# Patient Record
Sex: Female | Born: 1935 | Race: White | Hispanic: No | Smoking: Former smoker
Health system: Southern US, Community
[De-identification: ages and names within clinical notes are randomized; demographics above are authoritative.]

## PROBLEM LIST (undated history)

## (undated) DIAGNOSIS — I480 Paroxysmal atrial fibrillation: Secondary | ICD-10-CM

## (undated) DIAGNOSIS — H9190 Unspecified hearing loss, unspecified ear: Secondary | ICD-10-CM

## (undated) DIAGNOSIS — K219 Gastro-esophageal reflux disease without esophagitis: Secondary | ICD-10-CM

## (undated) DIAGNOSIS — J449 Chronic obstructive pulmonary disease, unspecified: Secondary | ICD-10-CM

## (undated) DIAGNOSIS — I4891 Unspecified atrial fibrillation: Secondary | ICD-10-CM

## (undated) DIAGNOSIS — M1990 Unspecified osteoarthritis, unspecified site: Secondary | ICD-10-CM

## (undated) DIAGNOSIS — R011 Cardiac murmur, unspecified: Secondary | ICD-10-CM

## (undated) DIAGNOSIS — E785 Hyperlipidemia, unspecified: Secondary | ICD-10-CM

## (undated) DIAGNOSIS — Z9289 Personal history of other medical treatment: Secondary | ICD-10-CM

## (undated) DIAGNOSIS — N289 Disorder of kidney and ureter, unspecified: Secondary | ICD-10-CM

## (undated) DIAGNOSIS — K449 Diaphragmatic hernia without obstruction or gangrene: Secondary | ICD-10-CM

## (undated) DIAGNOSIS — M810 Age-related osteoporosis without current pathological fracture: Secondary | ICD-10-CM

## (undated) DIAGNOSIS — K222 Esophageal obstruction: Secondary | ICD-10-CM

## (undated) DIAGNOSIS — F419 Anxiety disorder, unspecified: Secondary | ICD-10-CM

## (undated) DIAGNOSIS — R918 Other nonspecific abnormal finding of lung field: Secondary | ICD-10-CM

## (undated) DIAGNOSIS — E039 Hypothyroidism, unspecified: Secondary | ICD-10-CM

## (undated) HISTORY — PX: CATARACT EXTRACTION W/ INTRAOCULAR LENS  IMPLANT, BILATERAL: SHX1307

## (undated) HISTORY — DX: Unspecified hearing loss, unspecified ear: H91.90

## (undated) HISTORY — PX: ABDOMINAL HYSTERECTOMY: SHX81

## (undated) HISTORY — PX: ESOPHAGEAL DILATION: SHX303

---

## 2004-01-10 HISTORY — PX: BREAST ENHANCEMENT SURGERY: SHX7

## 2004-09-08 ENCOUNTER — Ambulatory Visit (HOSPITAL_COMMUNITY): Admission: RE | Admit: 2004-09-08 | Discharge: 2004-09-08 | Payer: Self-pay | Admitting: Gastroenterology

## 2007-05-29 ENCOUNTER — Ambulatory Visit: Payer: Self-pay | Admitting: Cardiology

## 2007-05-29 ENCOUNTER — Inpatient Hospital Stay (HOSPITAL_COMMUNITY): Admission: EM | Admit: 2007-05-29 | Discharge: 2007-06-10 | Payer: Self-pay | Admitting: Emergency Medicine

## 2007-06-04 ENCOUNTER — Encounter (INDEPENDENT_AMBULATORY_CARE_PROVIDER_SITE_OTHER): Payer: Self-pay | Admitting: Internal Medicine

## 2007-06-19 ENCOUNTER — Ambulatory Visit: Payer: Self-pay | Admitting: Cardiology

## 2010-05-24 NOTE — Consult Note (Signed)
Amy [name], [name] NO.:  [id number]   MEDICAL RECORD NO.:  [id number]          PATIENT TYPE:  INP   LOCATION:  1404                         FACILITY:  [facility]   PHYSICIAN:  Rollene Rotunda, MD, FACCDATE OF BIRTH:  [birth year]   DATE OF CONSULTATION:  05/31/2007  DATE OF DISCHARGE:                                 CONSULTATION   PRIMARY CARE PHYSICIAN:  Will be Dr. Antoine Poche.   PRIMARY CARE PHYSICIAN:  Dr. Herb Grays.   PATIENT PROFILE:  A 75 year old Caucasian female without prior cardiac  history who we are asked to evaluate for PAF.   PROBLEMS:  1. Paroxysmal atrial fibrillation.  2. Acute bronchitis with hypoxia.  3. Remote tobacco abuse, quitting 14 years ago following greater than      25-pack-year history.  4. History of hiatal hernia status post savory dilatation August 2006.  5. Osteoporosis.  6. Osteoarthritis.  7. Hypothyroidism.  8. Status post breast implants for cosmetic reasons.  9. Status post colonoscopy in 2006 which was normal.  10.Uterine polyps status post hysterectomy.   HISTORY OF PRESENT ILLNESS:  A 76 year old married Caucasian female with  an approximately five year history of infrequent palpitations and  fluttering occurring approximately two times per year, lasting less than  one hour and resolving spontaneously. Last episode occurred  approximately six months ago. Over the past week, she developed cough  with yellow sputum and subsequently developed fevers, chills, and  dyspnea on this past Tuesday night, prompting a visit to her primary  care Amy [name] on Wednesday and then admission to [facility] for IV  antibiotics. Early this morning about midnight, she noted fluttering  without chest pain or shortness of breath. This fluttering was similar  to previous episodes of palpitations. The telemetry monitor showed  coarse atrial fibrillation, and she converted spontaneously to sinus  rhythm at approximately 3 a.m. She has no  complaints today. She is very  active at home without prior history of chest pain, shortness of breath.  Denies PND, orthopnea, dizziness, syncope, edema, or early satiety.   ALLERGIES:  CODEINE.   CURRENT MEDICATIONS:  1. Albuterol inhaler q.6h.  2. Vitamin C 500 mg daily.  3. Aspirin 81 mg daily.  4. Zithromax 500 mg IV daily.  5. Os-Cal 500 mg daily.  6. Rocephin 1 g IV daily.  7. Plavix 75 mg daily.  8. Protonix 40 mg IV every night.  9. Neutra-Phos t.i.d.  10.Enoxaparin 40 mg subcutaneous daily.  11.Mucinex 600 mg b.i.d.  12.Tussionex 5 mL b.i.d.  13.Atrovent inhaler q.6h.  14.Synthroid 100 mcg daily.  15.Multivitamin daily.  16.Omega-3 1 g b.i.d.  17.Senna 1 tablet every night.   FAMILY HISTORY:  Mother died of heart failure at age 40. Father died of  a MI at 38. She has a brother who died at 29, again with history of  diabetes.   SOCIAL HISTORY:  She lives with her husband in [place]. She has a  greater than 25-pack-year history of tobacco abuse, quitting about 14  years ago. She is still using nicotine gum.  REVIEW OF SYSTEMS:  Cough with yellow sputum, pleuritic chest pain, and  history of palpitations as outlined in the HPI. Otherwise, all systems  reviewed and negative.   PHYSICAL EXAMINATION:  Temperature 98.1, heart rate 80, respirations 20,  blood pressure awake, oriented x3.  SKIN:  Is warm and dry without lesions or masses.  NEUROLOGICAL:  Is grossly intact and nonfocal.  HEENT:  Normal.  NECK:  No bruits or JVD.  LUNGS:  Expirations with scattered rhonchi, expiratory and inspiratory  wheezing, and crackles at bilateral bases.  CARDIAC:  Regular, S1/S2. No S3, S4, murmurs.  ABDOMEN:  Soft, nontender, nondistended. Bowel sounds present x4.  EXTREMITIES:  Warm, dry, pink. No clubbing, cyanosis, or edema. Dorsalis  pedis, posterior tibia pulses 2+ and equal bilaterally. No femoral or  abdominal bruits are noted.   ACCESSORY CLINICAL FINDINGS:   Hemoglobin 9.5, hematocrit 28.5, WBC 15.9,  platelets 214. Sodium 139, potassium 3.7, chloride 109, CO2 26, BUN 6,  creatinine 0.73, glucose 112. Total bilirubin 1.0, alkaline phosphatase  86, AST 21, ALT 19. Cardiac markers negative x5. Magnesium 2.0. TSH  0.031. Nasal swab was negative. Blood cultures negative x2. Urinalysis  showed moderate leukocytes and 36 WBCs. ECG showed atrial fibrillation  that converted mid ECG to sinus rhythm at a rate of 82; no ST-T changes.  Telemetry showed coarse atrial fibrillation earlier this morning,  converted to sinus rhythm at about 3 a.m. Chest x-ray shows no acute  infiltrate, mild hyperinflation, mild central increase in bronchial  markers, and mild bronchitic changes.   ASSESSMENT AND PLAN:  Paroxysmal atrial fibrillation. She has a five-  year history of palpitations and fluttering occurring about two times  per year, lasting less than an hour. She did have symptoms early this  morning that resolved spontaneously. Her CHADS is 0, without prior  history of congestive heart failure, hypertension, age less than 70, no  history of diabetes or stroke. Add low-dose diltiazem. I have chosen  this over a beta blocker secondary to active wheezing. Also, continue  full-strength aspirin and discontinue Plavix. TSH was low, and the  primary team has already dropped her Synthroid dose to 100 mcg daily.  This will need close outpatient followup.  Agree with checking 2-D echocardiogram, although if this cannot be  performed during her inpatient stay, we can arrange for this to be done  in our office. We will also arrange for her to follow up with Dr.  Antoine Poche in our office on June 10 at 2 p.m.      Nicolasa Ducking, ANP      Rollene Rotunda, MD, [facility]  Electronically Signed    CB/MEDQ  D:  05/31/2007  T:  05/31/2007  Job:  [id number]

## 2010-05-24 NOTE — H&P (Signed)
Amy [name], Amy [name]                  ACCOUNT NO.:  [id number]   MEDICAL RECORD NO.:  [id number]          PATIENT TYPE:  INP   LOCATION:  0108                         FACILITY:  [facility]   PHYSICIAN:  Herbie Saxon, MDDATE OF BIRTH:  20-May-1935   DATE OF ADMISSION:  05/29/2007  DATE OF DISCHARGE:                              HISTORY & PHYSICAL   PRIMARY CARE PHYSICIAN:  Dr. Collins Scotland.   Contact person is her husband, Leonardo Plaia, phone number __________  [phone].   PRESENTING COMPLAINT:  Cough, yellowish sputum one week, fever two days.   HISTORY OF PRESENTING COMPLAINT:  This is a 75 year old Caucasian lady  with history of osteoarthritis, hyperthyroidism, tobacco abuse, who was  quite well until a week ago when she started coughing up yellowish-  greenish phlegm associated with wheezing.  She started feeling short of  breath about three days ago, experiencing high grade fever and chills  two days ago.  She had pneumonia more than ten years ago and she reports  having gotten the flu and pneumonia vaccine last year.  Patient states  that her chest is generally sore with coughing.  No palpitations.  No  hemoptysis.  There is no paroxysmal nocturnal dyspnea, orthopnea.  No  facial or leg swelling.  Patient does not give any history of recent  travel and no exposure to any persons with flu like symptoms.  She was  also expressing some running nose and stuffiness prior to onset of these  symptoms.  She denies any skin rash or __________ .  No joint swelling.  Patient does not have any symptoms referable to the cardiovascular, the  genitourinary, gastrointestinal, musculoskeletal systems.   PAST MEDICAL HISTORY:  1. Osteoporosis.  2. Osteoarthritis.  3. Hyperthyroidism.   PAST SURGICAL HISTORY:  1. Colonoscopy 2006 and normal.  2. Breast implants.  3. Hysterectomy for uterine polyps.   SOCIAL HISTORY:  She is married, lives with her husband.  They have  three children.   She had smoked one pack per day for more than 25 years.  She stopped about 14 years ago.  No history of alcohol or illicit drug  abuse.  No recent air travel.   FAMILY HISTORY:  Mother, congestive heart failure.  Father,  diabetes.  Brother,  diabetes.   REVIEW OF SYSTEMS:  14 systems are reviewed.  Pertinent positives in  history of presenting complaint.   MEDICATIONS:  1. Aspirin 81 mg daily.  2. Chantix one daily .  3. Fish oil, 1 tablet b.i.d.  4. Fosamax 70 mg weekly.  5. Levothyroxine 125 mcg alternating with 150 mcg daily.  6. Multivitamin 1 tablet b.i.d.  7. Os-Cal  one tablet daily.  8. Phenergan 25 mg every 6 hour p.r.n.  9. Skelaxin 800 mg nightly for muscle spasm.  10.Vitamin C one tablet daily.   ALLERGIES:  No known drug allergy, BUT PATIENT HAD SOME REACTION WITH  CODEINE IN THE PAST, UNSPECIFIED.   PHYSICAL EXAMINATION:  On presenting to emergency room today, her vital  signs:  Temperature was 102.6, pulse was 90, respiratory rate  20, blood  pressure 110/46.  Pupils equal, reactive to light and accommodation.  Head is atraumatic,  normocephalic.  Mucus membranes are moist.  The oropharynx and  nasopharynx are clear.  No air or discharge.  NECK:  Supple.  There is no submandibular lymphadenopathy.  No elevated  JVD or thyromegaly.  No carotid bruit.  HEART:  Sounds 1 and 2 regular.  LUNGS:  She has widespread expiratory rhonchi.  ABDOMEN:  Benign.  There is no organomegaly.  She is alert, oriented in time, place and person.  Power is 5 all limbs.  Deep tendon reflexes 2+ all limbs.  Sensation normal.  Cranial nerves II-  XII intact.  Peripheral pulses present. No pedal edema.   LABORATORY DATA:  Sodium is 134, potassium 2.8, chloride 100,  bicarbonate 25, glucose 108, BUN 16, creatinine 0.9.  WBCs 20.9,  hemoglobin 12, hematocrit 37.  Platelet count is 275. Urinalysis is  negative.  Chest x-ray shows no acute infiltrate.  There is mild hyperinflation.   Marked centrally increased bronchial markings suggesting mild bronchitic  changes.  Patient was saturating less than 90% on room air.   ASSESSMENT:  1. Acute bronchitis.  Rule out early pneumonia..  2. Leucocytosis.  3. History of tobacco abuse.  Rule out underlying chronic obstructive      pulmonary disease.  4. Acute hypoxic respiratory insufficiency.  5. Mild hyponatremia.   Patient is to be admitted to telemetry bed.  We will get blood cultures,  sputum culture and start her on IV Rocephin and Zithromax.  Continue  with home medications.  Put her on nicotine patch 21 mg daily.  Ambien 5  mg nightly p.r.n. for insomnia.  We will put on Lovenox 40 mg daily for  deep vein thrombosis prophylaxis and Protonix 40 mg IV daily.  Begin  hydration with IV fluids  normal saline at 80 mL an hour.  Check for  Fluid overload.  Complete a  thyroid function test.  check magnesium,  phosphate level.  Put her on pulse oximetry monitoring to keep sats> 90.  Oxygen supplemented 2 to 5 liters by nasal cannula.  She is to be on  Atrovent and Proventil, one unit dose every 6 hours standing and every 2  to 4 hours p.r.n. for shortness of breath.  Bed rest for 24 hours.  We  will obtain CBC and BMP in the morning.   Patient's illness, medications, treatment were explained to her and her  husband.  They verbalized understanding.   Time of encounter:  50 minutes.      Herbie Saxon, MD  Electronically Signed     MIO/MEDQ  D:  05/29/2007  T:  05/29/2007  Job:  [id number]   cc:   Tammy R. Collins Scotland, M.D.  Fax: [phone]

## 2010-05-24 NOTE — Discharge Summary (Signed)
Amy [name], Amy [name]                  ACCOUNT NO.:  [id number]   MEDICAL RECORD NO.:  [id number]          PATIENT TYPE:  INP   LOCATION:  1404                         [facility]ACILITY[facility]  [facility]   PHYSICIAN:  Herbie Saxon, MDDATE OF BIRTH:  [birth year]   DATE OF ADMISSION:  05/29/2007  DATE OF DISCHARGE:  06/03/2007                               DISCHARGE SUMMARY   PRIMARY CARE PHYSICIAN:  Dr. Herb Grays.  Consult by Dr. Cloyd Stagers,  [facility].   A chest x-ray of Jun 02, 2007 shows findings compatible with bilateral  pneumonia.  Chest x-ray of May 29, 2007 shows mild bronchitic changes.   HOSPITAL COURSE:  This 75 year old Caucasian lady presented with cough  productive of yellowish phlegm of one week's duration, fever of two  days' duration.  She had leukocytosis of 20,000 on admission that shows  hypoxia with mild hyponatremia.  She was started on an IV.  By the  second day of admission, she was noted to have paroxysmal atrial  fibrillation.  A cardiology consult was sought.  Cardiologist  recommended continued aspirin, and IV loptressor.  Cardiac did recommend  2D echocardiogram which will be done as an outpatient.  It was noticed  that her TSH level was low, and Synthroid dose was lowered from 150 mcg  to 100 mcg.  Leukocytosis is trending down.  However, on 06/02/2007, the  patient seemed to be in more respiratory distress with hypoxia and  hypercarbia.  Chest x-ray does showed bilateral infiltrates worse on the  right leaf mild pulmonaryc congestion.  The patient's antibiotic was  changed to iv zosyn and vancomycin with low dose  Lasix IV for diuresis.  Her condition has improved.  ABG's and chest x-rays will be monitored.  Continue to monitor the white blood count.   MEDICATIONS:  Medications to be dictated on final discharge.   FOLLOWUP:  She will follow up with her primary care doctor, Dr. Herb Grays.  She will also followup with Dr. Cloyd Stagers.   DISCHARGE  DIAGNOSES:  1. Bilateral pneumonia.  2. Mild pulmonary congestion.  3. Atrial fibrillation with controlled ventricular rate.  4. Hypothyroid .   PHYSICAL EXAMINATION:  GENERAL:  She is an elderly lady in no acute  distress.  VITAL SIGNS:  Temperature 98, pulse 80, respiratory rate 20, blood  pressure 124/52.  HEENT:  Head atraumatic, normocephalic.  NECK:  There is no evidence of JVD or carotid bruit or thyromegaly.  ABDOMEN:  Benign.  No lymphadenopathy.  CARDIOVASCULAR:  S1, S2, regular.  EXTREMITIES:  No pedal edema.   LABORATORY DATA:  .  Glucose 104, BUN 3, creatinine 0.5.      Herbie Saxon, MD  Electronically Signed     MIO/MEDQ  D:  06/03/2007  T:  06/03/2007  Job:  [id number]   cc:   Tammy R. Collins Scotland, M.D.  Fax: [phone]

## 2010-05-24 NOTE — Assessment & Plan Note (Signed)
[facility]                            CARDIOLOGY OFFICE NOTE   SHADE, RIVENBARK                         MRN:          [id number]  DATE:06/19/2007                            DOB:          [birth year]    PRIMARY CARE PHYSICIAN:  Tammy R. Collins Scotland, MD   REASON FOR PRESENTATION:  The patient with atrial fibrillation.   HISTORY OF PRESENT ILLNESS:  The patient was seen in consultation on May 31, 2007.  She was hospitalized with pneumonia and had atrial  fibrillation.  She had a history of paroxysmal atrial fibrillation prior  to this.  She had atrial fibrillation during that hospitalization that  converted spontaneously to sinus rhythm.  She has since been slowly  recovering from a pneumonia.  She is still not back at baseline.  She  has not noticed any sustained tachy palpitations, though she may  occasionally have some skipped heartbeats.  She is not quite aware of  the atrial fibrillation or at least was not in the hospital.  She says  she has noted it in the past.  She does not think she has had any  sustained symptoms such as she had previously.  She denies any chest  discomfort.  She has no new shortness of breath.  She has a cough,  nonproductive.  She has had no PND or orthopnea.  She did have an  echocardiogram prior to discharge that demonstrated an EF of 65-70%.  There was  some evidence of some diastolic dysfunction.  Otherwise,  there were no significant abnormalities.  She had normal electrolytes  during that hospitalization.  She was noted to have slightly abnormal  TSH and had adjustment to her thyroid replacement.   PAST MEDICAL HISTORY:  1. Paroxysmal atrial fibrillation.  2. Pneumonia/bronchitis.  3. Hiatal hernia.  4. Esophageal stricture with dilatation.  5. Osteoporosis.  6. Osteoarthritis.  7. Hypothyroidism.  8. Bilateral breast implants.  9. Uterine polyps status post hysterectomy.   ALLERGIES:  Intolerances to  CODEINE.   MEDICATIONS:  1. Levothyroxine 125 mcg daily.  2. Mucinex.  3. Benzonatate.  4. Nitrofurantoin.  5. Fish oil.  6. Multivitamin.  7. Vitamin C.   REVIEW OF SYSTEMS:  As stated in the HPI, otherwise negative for other  systems.   PHYSICAL EXAMINATION:  The patient is in no distress.  Blood pressure 144/77, heart rate 80 and regular, weight 106 pounds, and  body mass index 17.  HEENT:  Eyes are unremarkable, pupils equal round and reactive to light,  fundi not visualized, oral mucosa unremarkable.  NECK:  No jugular venous distention at 45 degrees, carotid upstroke  brisk and symmetrical, no bruits, no thyromegaly.  LYMPHATICS:  No cervical, axillary, or inguinal adenopathy.  LUNGS:  Clear to auscultation bilaterally.  BACK:  No costovertebral angle tenderness.  CHEST:  Unremarkable.  HEART:  PMI nondisplaced or sustained, S1 and S2 within normal limits,  no S3, no S4, no clicks, no rubs, no murmurs.  ABDOMEN:  Flat, positive bowel sounds, normal frequency and pitch, no  bruits, no rebound,  no guarding, no midline pulsatile mass, no  hepatosplenomegaly.  SKIN:  No rashes, no nodules.  EXTREMITIES:  2+ pulses throughout, no edema, no cyanosis, no clubbing.  NEURO:  Oriented to person, place, and time, cranial nerves II-XII  grossly intact, motor grossly intact.   EKG: Sinus rhythm, right axis deviation, rate 80, possible left atrial  enlargement, no acute ST-wave changes.   ASSESSMENT/PLAN:  1. Atrial fibrillation.  The patient has not had any sustained      symptomatic paroxysms of this.  At this point, no further therapy      is warranted.  I did tell her that at a certain age she starts to      have high thromboembolic risk if she is having paroxysms of atrial      fibrillation.  She should come back and see me at age 75 or sooner      if she has any symptomatic sustained tachyarrhythmias.  Right now,      her [place] score is very low.  She does not have an  indication for      chronic anticoagulation.  2. Hypertension.  Blood pressure is slightly elevated.  She can have      this followed by Dr. Collins Scotland going forward.  3. Followup.  I will see her back as stated.     Rollene Rotunda, MD, [facility]  Electronically Signed    JH/MedQ  DD: 06/19/2007  DT: 06/20/2007  Job #: [id number]   cc:   Tammy R. Collins Scotland, M.D.

## 2010-05-27 NOTE — Op Note (Signed)
Amy [name], Amy [name]                  ACCOUNT NO.:  [id number]   MEDICAL RECORD NO.:  [id number]          PATIENT TYPE:  AMB   LOCATION:  ENDO                         FACILITY:  [facility]   PHYSICIAN:  Petra Kuba, M.D.    DATE OF BIRTH:  [birth year]   DATE OF PROCEDURE:  09/08/2004  DATE OF DISCHARGE:                                 OPERATIVE REPORT   PROCEDURE:  EGD with Savary dilatation.   ENDOSCOPIST:  Petra Kuba, M.D.   INDICATIONS:  Increasing dysphagia, history of hiatal hernia.  Consent was  signed after risks, benefits, methods, options thoroughly discussed multiple  times in the past.   MEDICINES USED:  Demerol 70, Versed 5.   DESCRIPTION OF PROCEDURE:  The video endoscope was inserted by direct  vision.  The esophagus was normal except for a tiny hiatal hernia and  possibly a widely patent very thin ring just above it.  The scope passed in  the stomach, advanced through a normal antrum, normal pylorus into a normal  duodenal bulb and around the C-loop to a normal second portion of the  duodenum.  The scope was slowly withdrawn back to the bulb.  A good look  there ruled out abnormalities in that location.  The scope was withdrawn  back to the stomach and retroflexed.  Cardia, fundus, angularis, lesser and  greater curve were evaluated on retroflex and straight visualization and  other than the atrophic gastritis mild, no other abnormalities were seen.  The scope was slowly withdrawn back to the posterior pharynx, again  confirming a normal esophagus.  The scope was then re-advanced to the  antrum, and under fluoroscopic guidance the Savary wire was advanced.  The  customary J-loop was confirmed under fluoroscopy.  The scope was removed  making sure to keep the wire in the proper position, and in succession the  Savary 14 and then 16 dilators were advanced and confirmed in the proper  position in the stomach.  There was no resistance and no heme on passing  either  dilator.  Once the 16 was confirmed in the stomach, the wire was  withdrawn back into the dilator.  Both were removed in tandem.  The patient  tolerated the procedure well.  There was no obvious immediate complication.   ENDOSCOPIC DIAGNOSES:  1.  Tiny hiatal hernia and questionably thin widely patent ring  2.  Minimal atrophic gastritis.  3.  Otherwise normal esophagogastroduodenoscopy.   THERAPY:  Savary dilatation under fluoroscopy to 16 mm without any heme or  resistance.   PLAN:  1.  See how the dilation works.  2.  I gave her some samples of some Prevacid Solutab.  She will take that to      see if that also helps with her      dysphagia or laryngitis.  She can call me for a prescription or switch      to Prilosec OTC.  3.  I would be happy to see her back p.r.n., particularly if the dilation      does not work or  in two months just to check on symptoms.           ______________________________  Petra Kuba, M.D.     MEM/MEDQ  D:  09/08/2004  T:  09/09/2004  Job:  [id number]

## 2010-10-05 LAB — URINALYSIS, ROUTINE W REFLEX MICROSCOPIC
Glucose, UA: NEGATIVE
Nitrite: NEGATIVE
Protein, ur: 30 — AB
Specific Gravity, Urine: 1.021
Urobilinogen, UA: 0.2
pH: 5.5

## 2010-10-05 LAB — CBC
HCT: 28.5 — ABNORMAL LOW
HCT: 30.6 — ABNORMAL LOW
HCT: 31 — ABNORMAL LOW
HCT: 32 — ABNORMAL LOW
HCT: 37.2
Hemoglobin: 10.1 — ABNORMAL LOW
Hemoglobin: 10.6 — ABNORMAL LOW
Hemoglobin: 9.9 — ABNORMAL LOW
MCHC: 33.2
MCHC: 33.2
MCHC: 33.2
MCHC: 33.5
MCHC: 33.5
MCV: 96.3
MCV: 96.8
MCV: 96.8
MCV: 97
MCV: 97.2
Platelets: 214
Platelets: 229
Platelets: 235
Platelets: 275
Platelets: 318
Platelets: 343
Platelets: 645 — ABNORMAL HIGH
RBC: 2.95 — ABNORMAL LOW
RBC: 2.95 — ABNORMAL LOW
RBC: 2.99 — ABNORMAL LOW
RBC: 3.13 — ABNORMAL LOW
RBC: 3.3 — ABNORMAL LOW
RDW: 13.1
RDW: 13.5
RDW: 13.8
RDW: 14.4
WBC: 10.1
WBC: 10.4
WBC: 15.9 — ABNORMAL HIGH
WBC: 16 — ABNORMAL HIGH
WBC: 20.9 — ABNORMAL HIGH
WBC: 23 — ABNORMAL HIGH

## 2010-10-05 LAB — BLOOD GAS, ARTERIAL
Bicarbonate: 28 — ABNORMAL HIGH
Drawn by: 246861
O2 Content: 2
O2 Saturation: 86.7
Patient temperature: 98.6
pCO2 arterial: 52.9 — ABNORMAL HIGH
pH, Arterial: 7.406 — ABNORMAL HIGH

## 2010-10-05 LAB — COMPREHENSIVE METABOLIC PANEL
ALT: 19
ALT: 35
AST: 21
Albumin: 3.4 — ABNORMAL LOW
Alkaline Phosphatase: 109
Alkaline Phosphatase: 149 — ABNORMAL HIGH
BUN: 3 — ABNORMAL LOW
CO2: 26
Calcium: 7.4 — ABNORMAL LOW
Chloride: 100
Creatinine, Ser: 0.9
GFR calc Af Amer: >60
GFR calc Af Amer: >60
GFR calc non Af Amer: >60
GFR calc non Af Amer: >60
Glucose, Bld: 104 — ABNORMAL HIGH
Potassium: 3.7
Sodium: 139
Total Bilirubin: 0.6
Total Bilirubin: 0.9
Total Protein: 5.1 — ABNORMAL LOW
Total Protein: 6.6
Total Protein: 7.3

## 2010-10-05 LAB — CULTURE, BLOOD (ROUTINE X 2)
Culture: NO GROWTH
Culture: NO GROWTH

## 2010-10-05 LAB — MAGNESIUM
Magnesium: 1.7
Magnesium: 1.9
Magnesium: 2.1

## 2010-10-05 LAB — CARDIAC PANEL(CRET KIN+CKTOT+MB+TROPI)
CK, MB: 1.7
CK, MB: 1.9
CK, MB: 3
Relative Index: INVALID
Relative Index: INVALID
Relative Index: INVALID
Total CK: 109
Total CK: 53
Total CK: 78
Total CK: 82
Troponin I: 0.04
Troponin I: 0.04
Troponin I: 0.04
Troponin I: 0.04

## 2010-10-05 LAB — BASIC METABOLIC PANEL
BUN: 11
BUN: 7
CO2: 35 — ABNORMAL HIGH
CO2: 39 — ABNORMAL HIGH
Calcium: 8.4
Calcium: 8.6
Calcium: 8.9
Chloride: 89 — ABNORMAL LOW
Creatinine, Ser: 0.56
Creatinine, Ser: 0.6
GFR calc Af Amer: >60
GFR calc Af Amer: >60
GFR calc Af Amer: >60
GFR calc non Af Amer: >60
GFR calc non Af Amer: >60
Glucose, Bld: 108 — ABNORMAL HIGH
Glucose, Bld: 112 — ABNORMAL HIGH
Glucose, Bld: 113 — ABNORMAL HIGH
Potassium: 3.8
Sodium: 137
Sodium: 138

## 2010-10-05 LAB — POCT CARDIAC MARKERS: Myoglobin, poc: 79.2

## 2010-10-05 LAB — DIFFERENTIAL
Lymphs Abs: 1.7
Monocytes Relative: 7

## 2010-10-05 LAB — INFLUENZA A+B VIRUS AG-DIRECT(RAPID): Influenza B Ag: NEGATIVE

## 2010-10-05 LAB — PHOSPHORUS
Phosphorus: 2 — ABNORMAL LOW
Phosphorus: 2.6
Phosphorus: 3

## 2011-01-28 ENCOUNTER — Other Ambulatory Visit: Payer: Self-pay | Admitting: Orthopedic Surgery

## 2011-02-16 ENCOUNTER — Encounter (HOSPITAL_BASED_OUTPATIENT_CLINIC_OR_DEPARTMENT_OTHER): Payer: Self-pay | Admitting: *Deleted

## 2011-02-16 NOTE — Progress Notes (Addendum)
Pt instructed npo p mn 2/12 x levothyroxin w sip of h20.  To [facility] 02/22/11 @ 0800.  Needs hgb, ekg on arrival. Pt aware to do hibiclens shower am of surgery.

## 2011-02-21 NOTE — H&P (Signed)
  CC- Amy [name] is a 76 y.o. female who presents with right knee pain.  HPI- . Knee Pain: Patient presents with knee pain involving the  right knee. Onset of the symptoms was several months ago. Inciting event: injured while at work. Current symptoms include giving out, pain located medially and stiffness. Pain is aggravated by pivoting, squatting and walking.  Patient has had no prior knee problems. Evaluation to date: MRI: abnormal medial meniscal tear. Treatment to date: rest.  Past Medical History  Diagnosis Date  . GERD (gastroesophageal reflux disease)   . Hypothyroidism   . Arthritis     Past Surgical History  Procedure Date  . Cataract extraction w/ intraocular lens  implant, bilateral '09  . Abdominal hysterectomy   . Breast enhancement surgery 2006  . Esophageal dilation     Prior to Admission medications   Medication Sig Start Date End Date Taking? Authorizing Provider  aspirin 81 MG tablet Take 81 mg by mouth daily.    Yes Historical Provider, MD  calcium carbonate (OS-CAL - DOSED IN MG OF ELEMENTAL CALCIUM) 1250 MG tablet Take 1 tablet by mouth daily.   Yes Historical Provider, MD  fish oil-omega-3 fatty acids 1000 MG capsule Take 2 g by mouth daily.   Yes Historical Provider, MD  folic acid (FOLVITE) 1 MG tablet Take 1 mg by mouth daily.   Yes Historical Provider, MD  levothyroxine (SYNTHROID, LEVOTHROID) 112 MCG tablet Take 112 mcg by mouth daily.   Yes Historical Provider, MD  Multiple Vitamin (MULTIVITAMIN) capsule Take 1 capsule by mouth daily.   Yes Historical Provider, MD  traMADol (ULTRAM) 50 MG tablet Take 50 mg by mouth every 6 (six) hours as needed.   Yes Historical Provider, MD   KNEE EXAM antalgic gait, soft tissue tenderness over medial joint line, negative drawer sign, collateral ligaments intact, normal ipsilateral hip exam  Physical Examination: General appearance - alert, well appearing, and in no distress Mental status - alert, oriented to person,  place, and time Chest - clear to auscultation, no wheezes, rales or rhonchi, symmetric air entry Heart - normal rate, regular rhythm, normal S1, S2, no murmurs, rubs, clicks or gallops Abdomen - soft, nontender, nondistended, no masses or organomegaly Neurological - alert, oriented, normal speech, no focal findings or movement disorder noted   Asessment/Plan---Right knee medial meniscal tear- - Plan lright knee arthroscopy with meniscal debridement. Procedure risks and potential comps discussed with patient who elects to proceed. Goals are decreased pain and increased function with a high likelihood of achieving both

## 2011-02-22 ENCOUNTER — Encounter (HOSPITAL_BASED_OUTPATIENT_CLINIC_OR_DEPARTMENT_OTHER): Payer: Self-pay | Admitting: Anesthesiology

## 2011-02-22 ENCOUNTER — Encounter (HOSPITAL_BASED_OUTPATIENT_CLINIC_OR_DEPARTMENT_OTHER): Payer: Self-pay | Admitting: *Deleted

## 2011-02-22 ENCOUNTER — Encounter (HOSPITAL_BASED_OUTPATIENT_CLINIC_OR_DEPARTMENT_OTHER)
Admission: RE | Disposition: A | Discharge status: Home / Self Care | Payer: Self-pay | Source: Ambulatory Visit | Attending: Orthopedic Surgery

## 2011-02-22 ENCOUNTER — Ambulatory Visit (HOSPITAL_BASED_OUTPATIENT_CLINIC_OR_DEPARTMENT_OTHER)
Admission: RE | Admit: 2011-02-22 | Discharge: 2011-02-22 | Disposition: A | Discharge status: Home / Self Care | Payer: Worker's Compensation | Source: Ambulatory Visit | Attending: Orthopedic Surgery | Admitting: Orthopedic Surgery

## 2011-02-22 ENCOUNTER — Other Ambulatory Visit: Payer: Self-pay

## 2011-02-22 ENCOUNTER — Ambulatory Visit (HOSPITAL_BASED_OUTPATIENT_CLINIC_OR_DEPARTMENT_OTHER): Payer: Worker's Compensation | Admitting: Anesthesiology

## 2011-02-22 DIAGNOSIS — IMO0002 Reserved for concepts with insufficient information to code with codable children: Secondary | ICD-10-CM | POA: Insufficient documentation

## 2011-02-22 DIAGNOSIS — E039 Hypothyroidism, unspecified: Secondary | ICD-10-CM | POA: Insufficient documentation

## 2011-02-22 DIAGNOSIS — Z79899 Other long term (current) drug therapy: Secondary | ICD-10-CM | POA: Insufficient documentation

## 2011-02-22 DIAGNOSIS — X58XXXA Exposure to other specified factors, initial encounter: Secondary | ICD-10-CM | POA: Insufficient documentation

## 2011-02-22 DIAGNOSIS — M25569 Pain in unspecified knee: Secondary | ICD-10-CM | POA: Insufficient documentation

## 2011-02-22 DIAGNOSIS — M129 Arthropathy, unspecified: Secondary | ICD-10-CM | POA: Insufficient documentation

## 2011-02-22 DIAGNOSIS — K219 Gastro-esophageal reflux disease without esophagitis: Secondary | ICD-10-CM | POA: Insufficient documentation

## 2011-02-22 DIAGNOSIS — S83249A Other tear of medial meniscus, current injury, unspecified knee, initial encounter: Secondary | ICD-10-CM | POA: Diagnosis present

## 2011-02-22 HISTORY — DX: Unspecified osteoarthritis, unspecified site: M19.90

## 2011-02-22 HISTORY — PX: KNEE ARTHROSCOPY: SHX127

## 2011-02-22 HISTORY — DX: Gastro-esophageal reflux disease without esophagitis: K21.9

## 2011-02-22 HISTORY — DX: Hypothyroidism, unspecified: E03.9

## 2011-02-22 LAB — POCT HEMOGLOBIN-HEMACUE: Hemoglobin: 10.8 g/dL — ABNORMAL LOW (ref 12.0–15.0)

## 2011-02-22 SURGERY — ARTHROSCOPY, KNEE
Anesthesia: General | Site: Knee | Laterality: Right | Wound class: Clean

## 2011-02-22 MED ORDER — LACTATED RINGERS IV SOLN
INTRAVENOUS | Status: DC
  Administered 2011-02-22: 100 mL/h via INTRAVENOUS

## 2011-02-22 MED ORDER — PROPOFOL 10 MG/ML IV EMUL
INTRAVENOUS | Status: DC | PRN
  Administered 2011-02-22: 100 mg via INTRAVENOUS

## 2011-02-22 MED ORDER — BUPIVACAINE-EPINEPHRINE 0.25% -1:200000 IJ SOLN
INTRAMUSCULAR | Status: DC | PRN
  Administered 2011-02-22: 20 mL

## 2011-02-22 MED ORDER — FENTANYL CITRATE 0.05 MG/ML IJ SOLN: 25.0000 ug | INTRAMUSCULAR | Status: DC | PRN

## 2011-02-22 MED ORDER — SODIUM CHLORIDE 0.9 % IR SOLN
Status: DC | PRN
  Administered 2011-02-22: 12000 mL

## 2011-02-22 MED ORDER — OXYCODONE HCL 5 MG PO TABS: 5.0000 mg | ORAL_TABLET | ORAL | Status: AC | PRN

## 2011-02-22 MED ORDER — SODIUM CHLORIDE 0.9 % IV SOLN: INTRAVENOUS | Status: DC

## 2011-02-22 MED ORDER — CHLORHEXIDINE GLUCONATE 4 % EX LIQD: 60.0000 mL | Freq: Once | CUTANEOUS | Status: DC

## 2011-02-22 MED ORDER — LIDOCAINE HCL (CARDIAC) 20 MG/ML IV SOLN
INTRAVENOUS | Status: DC | PRN
  Administered 2011-02-22: 40 mg via INTRAVENOUS

## 2011-02-22 MED ORDER — CEFAZOLIN SODIUM 1-5 GM-% IV SOLN
1.0000 g | Freq: Once | INTRAVENOUS | Status: AC
  Administered 2011-02-22: 1 g via INTRAVENOUS

## 2011-02-22 MED ORDER — METHOCARBAMOL 500 MG PO TABS: 500.0000 mg | ORAL_TABLET | Freq: Four times a day (QID) | ORAL | Status: AC

## 2011-02-22 MED ORDER — PROMETHAZINE HCL 25 MG/ML IJ SOLN: 6.2500 mg | INTRAMUSCULAR | Status: DC | PRN

## 2011-02-22 MED ORDER — DEXAMETHASONE SODIUM PHOSPHATE 4 MG/ML IJ SOLN
INTRAMUSCULAR | Status: DC | PRN
  Administered 2011-02-22: 4 mg via INTRAVENOUS

## 2011-02-22 MED ORDER — DEXAMETHASONE SODIUM PHOSPHATE 10 MG/ML IJ SOLN: 10.0000 mg | Freq: Once | INTRAMUSCULAR | Status: DC

## 2011-02-22 MED ORDER — FENTANYL CITRATE 0.05 MG/ML IJ SOLN
INTRAMUSCULAR | Status: DC | PRN
  Administered 2011-02-22 (×2): 25 ug via INTRAVENOUS

## 2011-02-22 MED ORDER — ONDANSETRON HCL 4 MG/2ML IJ SOLN
INTRAMUSCULAR | Status: DC | PRN
  Administered 2011-02-22: 4 mg via INTRAVENOUS

## 2011-02-22 SURGICAL SUPPLY — 35 items
BANDAGE ELASTIC 6 VELCRO ST LF (GAUZE/BANDAGES/DRESSINGS) ×2 IMPLANT
BLADE 4.2CUDA (BLADE) ×2 IMPLANT
BLADE CUDA SHAVER 3.5 (BLADE) IMPLANT
BLADE CUTTER GATOR 3.5 (BLADE) IMPLANT
CANISTER SUCT LVC 12 LTR MEDI- (MISCELLANEOUS) ×2 IMPLANT
CANISTER SUCTION 2500CC (MISCELLANEOUS) IMPLANT
CLOTH BEACON ORANGE TIMEOUT ST (SAFETY) ×2 IMPLANT
DRAPE ARTHROSCOPY W/POUCH 114 (DRAPES) ×2 IMPLANT
DRSG EMULSION OIL 3X3 NADH (GAUZE/BANDAGES/DRESSINGS) ×2 IMPLANT
DRSG PAD ABDOMINAL 8X10 ST (GAUZE/BANDAGES/DRESSINGS) ×2 IMPLANT
DURAPREP 26ML APPLICATOR (WOUND CARE) ×2 IMPLANT
ELECT MENISCUS 165MM 90D (ELECTRODE) IMPLANT
ELECT REM PT RETURN 9FT ADLT (ELECTROSURGICAL)
ELECTRODE REM PT RTRN 9FT ADLT (ELECTROSURGICAL) IMPLANT
GLOVE BIO SURGEON STRL SZ 6.5 (GLOVE) ×2 IMPLANT
GLOVE BIO SURGEON STRL SZ8 (GLOVE) ×2 IMPLANT
GLOVE ECLIPSE 6.0 STRL STRAW (GLOVE) ×2 IMPLANT
GLOVE INDICATOR 8.0 STRL GRN (GLOVE) ×2 IMPLANT
GOWN PREVENTION PLUS LG XLONG (DISPOSABLE) ×4 IMPLANT
IV NS IRRIG 3000ML ARTHROMATIC (IV SOLUTION) ×2 IMPLANT
KNEE WRAP E Z 3 GEL PACK (MISCELLANEOUS) ×2 IMPLANT
PACK ARTHROSCOPY DSU (CUSTOM PROCEDURE TRAY) ×2 IMPLANT
PACK BASIN DAY SURGERY FS (CUSTOM PROCEDURE TRAY) ×2 IMPLANT
PADDING CAST ABS 4INX4YD NS (CAST SUPPLIES) ×1
PADDING CAST ABS COTTON 4X4 ST (CAST SUPPLIES) ×1 IMPLANT
PADDING CAST COTTON 6X4 STRL (CAST SUPPLIES) ×2 IMPLANT
PENCIL BUTTON HOLSTER BLD 10FT (ELECTRODE) IMPLANT
SET ARTHROSCOPY TUBING (MISCELLANEOUS) ×1
SET ARTHROSCOPY TUBING LN (MISCELLANEOUS) ×1 IMPLANT
SPONGE GAUZE 4X4 12PLY (GAUZE/BANDAGES/DRESSINGS) ×2 IMPLANT
SUT ETHILON 4 0 PS 2 18 (SUTURE) ×2 IMPLANT
TOWEL OR 17X24 6PK STRL BLUE (TOWEL DISPOSABLE) ×2 IMPLANT
WAND 30 DEG SABER W/CORD (SURGICAL WAND) IMPLANT
WAND 90 DEG TURBOVAC W/CORD (SURGICAL WAND) ×2 IMPLANT
WATER STERILE IRR 500ML POUR (IV SOLUTION) ×2 IMPLANT

## 2011-02-22 NOTE — Anesthesia Postprocedure Evaluation (Signed)
  Anesthesia Post-op Note  Patient: Amy [name]  Procedure(s) Performed: Procedure(s) (LRB): ARTHROSCOPY KNEE (Right)  Patient Location: PACU  Anesthesia Type: General  Level of Consciousness: awake and alert   Airway and Oxygen Therapy: Patient Spontanous Breathing  Post-op Pain: mild  Post-op Assessment: Post-op Vital signs reviewed, Patient's Cardiovascular Status Stable, Respiratory Function Stable, Patent Airway and No signs of Nausea or vomiting  Post-op Vital Signs: stable  Complications: No apparent anesthesia complications

## 2011-02-22 NOTE — Op Note (Signed)
Preoperative diagnosis-  Right knee medial meniscal tear  Postoperative diagnosis Right- knee medial meniscal tear   Procedure- Right knee arthroscopy with medial  Meniscal debridement   Surgeon- Gus Rankin. Nitzia Perren, MD  Anesthesia-General  EBL-  minimal Complications- None  Condition- PACU - hemodynamically stable.  Brief clinical note- -Amy [name] is a 76 y.o.  female with a several month history of right knee pain and mechanical symptoms. Exam and history suggested medial meniscal tear confirmed by MRI. The patient presents now for arthroscopy and debridement  Procedure in detail -       After successful administration of General anesthetic, a tourmiquet is placed high on the Right  thigh and the Right lower extremity is prepped and draped in the usual sterile fashion. Time out is performed by the surgical team. Standard superomedial and inferolateral portal sites are marked and incisions made with an 11 blade. The inflow cannula is passed through the superomedial portal and camera through the inferolateral portal and inflow is initiated. Arthroscopic visualization proceeds.      The undersurface of the patella and trochlea are visualized and they appear normal. The medial and lateral gutters are visualized and there are  no loose bodies. Flexion and valgus force is applied to the knee and the medial compartment is entered. A spinal needle is passed into the joint through the site marked for the inferomedial portal. A small incision is made and the dilator passed into the joint. The findings for the medial compartment are significant tear of the body and posterior horn of the medial meniscus with 2 flipped fragments . The tear is debrided to a stable base with baskets and a shaver and sealed off with the Arthrocare.     The intercondylar notch is visualized and the ACL appears normal. The lateral compartment is entered and the findings are normal .      The joint is again inspected and there  are no other tears, defects or loose bodies identified. The arthroscopic equipment is then removed from the inferior portals which are closed with interrupted 4-0 nylon. 20 ml of .25% Marcaine with epinephrine are injected through the inflow cannula and the cannula is then removed and the portal closed with nylon. The incisions are cleaned and dried and a bulky sterile dressing is applied. The patient is then awakened and transported to recovery in stable condition.   02/22/2011, 10:46 AM

## 2011-02-22 NOTE — Anesthesia Preprocedure Evaluation (Addendum)
Anesthesia Evaluation  Patient identified by MRN, date of birth, ID band Patient awake    Reviewed: Allergy & Precautions, H&P , NPO status , Patient's Chart, lab work & pertinent test results  Airway Mallampati: II TM Distance: >3 FB Neck ROM: Full    Dental No notable dental hx. (+) Edentulous Upper and Edentulous Lower   Pulmonary neg pulmonary ROS,  clear to auscultation  Pulmonary exam normal       Cardiovascular neg cardio ROS Regular Normal    Neuro/Psych Negative Neurological ROS  Negative Psych ROS   GI/Hepatic Neg liver ROS, GERD-  ,  Endo/Other  Hypothyroidism   Renal/GU negative Renal ROS  Genitourinary negative   Musculoskeletal negative musculoskeletal ROS (+)   Abdominal   Peds negative pediatric ROS (+)  Hematology negative hematology ROS (+)   Anesthesia Other Findings   Reproductive/Obstetrics negative OB ROS                          Anesthesia Physical Anesthesia Plan  ASA: II  Anesthesia Plan: General   Post-op Pain Management:    Induction: Intravenous  Airway Management Planned: LMA  Additional Equipment:   Intra-op Plan:   Post-operative Plan: Extubation in OR  Informed Consent: I have reviewed the patients History and Physical, chart, labs and discussed the procedure including the risks, benefits and alternatives for the proposed anesthesia with the patient or authorized representative who has indicated his/her understanding and acceptance.   Dental advisory given  Plan Discussed with: CRNA  Anesthesia Plan Comments: (Denies significant gerd)       Anesthesia Quick Evaluation

## 2011-02-22 NOTE — Discharge Instructions (Signed)
Arthroscopic Procedure, Knee An arthroscopic procedure can find what is wrong with your knee. PROCEDURE Arthroscopy is a surgical technique that allows your orthopedic surgeon to diagnose and treat your knee injury with accuracy. They will look into your knee through a small instrument. This is almost like a small (pencil sized) telescope. Because arthroscopy affects your knee less than open knee surgery, you can anticipate a more rapid recovery. Taking an active role by following your caregiver's instructions will help with rapid and complete recovery. Use crutches, rest, elevation, ice, and knee exercises as instructed. The length of recovery depends on various factors including type of injury, age, physical condition, medical conditions, and your rehabilitation. Your knee is the joint between the large bones (femur and tibia) in your leg. Cartilage covers these bone ends which are smooth and slippery and allow your knee to bend and move smoothly. Two menisci, thick, semi-lunar shaped pads of cartilage which form a rim inside the joint, help absorb shock and stabilize your knee. Ligaments bind the bones together and support your knee joint. Muscles move the joint, help support your knee, and take stress off the joint itself. Because of this all programs and physical therapy to rehabilitate an injured or repaired knee require rebuilding and strengthening your muscles. AFTER THE PROCEDURE  After the procedure, you will be moved to a recovery area until most of the effects of the medication have worn off. Your caregiver will discuss the test results with you.   Only take over-the-counter or prescription medicines for pain, discomfort, or fever as directed by your caregiver.  SEEK MEDICAL CARE IF:   You have increased bleeding from your wounds.   You see redness, swelling, or have increasing pain in your wounds.   You have pus coming from your wound.   You have an oral temperature above 102 F (38.9  C).   You notice a bad smell coming from the wound or dressing.   You have severe pain with any motion of your knee.  SEEK IMMEDIATE MEDICAL CARE IF:   You develop a rash.   You have difficulty breathing.   You have any allergic problems.  Document Released: 12/24/1999 Document Revised: 09/07/2010 Document Reviewed: 07/17/2007 [employer] Patient Information 2012 [employer].Arthroscopic Procedure, Knee An arthroscopic procedure can find what is wrong with your knee. PROCEDURE Arthroscopy is a surgical technique that allows your orthopedic surgeon to diagnose and treat your knee injury with accuracy. They will look into your knee through a small instrument. This is almost like a small (pencil sized) telescope. Because arthroscopy affects your knee less than open knee surgery, you can anticipate a more rapid recovery. Taking an active role by following your caregiver's instructions will help with rapid and complete recovery. Use crutches, rest, elevation, ice, and knee exercises as instructed. The length of recovery depends on various factors including type of injury, age, physical condition, medical conditions, and your rehabilitation. Your knee is the joint between the large bones (femur and tibia) in your leg. Cartilage covers these bone ends which are smooth and slippery and allow your knee to bend and move smoothly. Two menisci, thick, semi-lunar shaped pads of cartilage which form a rim inside the joint, help absorb shock and stabilize your knee. Ligaments bind the bones together and support your knee joint. Muscles move the joint, help support your knee, and take stress off the joint itself. Because of this all programs and physical therapy to rehabilitate an injured or repaired knee require rebuilding and   strengthening your muscles. AFTER THE PROCEDURE  After the procedure, you will be moved to a recovery area until most of the effects of the medication have worn off. Your caregiver will  discuss the test results with you.   Only take over-the-counter or prescription medicines for pain, discomfort, or fever as directed by your caregiver.  SEEK MEDICAL CARE IF:   You have increased bleeding from your wounds.   You see redness, swelling, or have increasing pain in your wounds.   You have pus coming from your wound.   You have an oral temperature above 102 F (38.9 C).   You notice a bad smell coming from the wound or dressing.   You have severe pain with any motion of your knee.  SEEK IMMEDIATE MEDICAL CARE IF:   You develop a rash.   You have difficulty breathing.   You have any allergic problems.  Document Released: 12/24/1999 Document Revised: 09/07/2010 Document Reviewed: 07/17/2007 [employer] Patient Information 2012 [employer]. 

## 2011-02-22 NOTE — Interval H&P Note (Signed)
History and Physical Interval Note:  02/22/2011 10:01 AM  Amy [name]  has presented today for surgery, with the diagnosis of RIGHT KNEE MEDIAL MENISCAL TEAR  The various methods of treatment have been discussed with the patient and family. After consideration of risks, benefits and other options for treatment, the patient has consented to  Procedure(s) (LRB): ARTHROSCOPY KNEE (Right) as a surgical intervention .  The patients' history has been reviewed, patient examined, no change in status, stable for surgery.  I have reviewed the patients' chart and labs.  Questions were answered to the patient's satisfaction.     Loanne Drilling

## 2011-02-22 NOTE — Transfer of Care (Signed)
Immediate Anesthesia Transfer of Care Note  Patient: Amy [name]  Procedure(s) Performed: Procedure(s) (LRB): ARTHROSCOPY KNEE (Right)  Patient Location: PACU  Anesthesia Type: General  Level of Consciousness: awake and oriented  Airway & Oxygen Therapy: Patient Spontanous Breathing and Patient connected to nasal cannula oxygen  Post-op Assessment: Report given to PACU RN  Post vital signs: Reviewed and stable  Complications: no copmplications

## 2011-02-22 NOTE — Anesthesia Procedure Notes (Signed)
Procedure Name: LMA Insertion Performed by: Cyle Kenyon Pre-anesthesia Checklist: Patient identified, Emergency Drugs available, Suction available and Patient being monitored Patient Re-evaluated:Patient Re-evaluated prior to inductionOxygen Delivery Method: Circle System Utilized Preoxygenation: Pre-oxygenation with 100% oxygen Intubation Type: IV induction Ventilation: Mask ventilation without difficulty LMA: LMA inserted LMA Size: 4.0 Number of attempts: 1 Placement Confirmation: positive ETCO2 Tube secured with: Tape Dental Injury: Teeth and Oropharynx as per pre-operative assessment      

## 2011-02-23 ENCOUNTER — Encounter (HOSPITAL_BASED_OUTPATIENT_CLINIC_OR_DEPARTMENT_OTHER): Payer: Self-pay | Admitting: Orthopedic Surgery

## 2011-03-01 ENCOUNTER — Encounter (HOSPITAL_BASED_OUTPATIENT_CLINIC_OR_DEPARTMENT_OTHER): Payer: Self-pay

## 2012-04-23 ENCOUNTER — Ambulatory Visit (INDEPENDENT_AMBULATORY_CARE_PROVIDER_SITE_OTHER): Payer: Medicare Other | Admitting: Internal Medicine

## 2012-04-23 ENCOUNTER — Encounter: Payer: Self-pay | Admitting: Internal Medicine

## 2012-04-23 VITALS — BP 130/80 | HR 88 | Temp 97.6°F | Ht 66.0 in | Wt 141.0 lb

## 2012-04-23 DIAGNOSIS — R109 Unspecified abdominal pain: Secondary | ICD-10-CM | POA: Insufficient documentation

## 2012-04-23 DIAGNOSIS — J449 Chronic obstructive pulmonary disease, unspecified: Secondary | ICD-10-CM | POA: Insufficient documentation

## 2012-04-23 DIAGNOSIS — R918 Other nonspecific abnormal finding of lung field: Secondary | ICD-10-CM

## 2012-04-23 NOTE — Progress Notes (Signed)
  Subjective:    Patient ID: Amy [name], female    DOB: 05/05/1935  MRN: [id number]  HPI   86 yowf quit smoking x 15 years with mild doe got some better and leveled off but referred by Dr Herb Grays for eval of abn ct of abn  04/23/2012 1st pulmonary cc doe x heavy exertion not adl's used to do 5 mile walk with husband but stopped p husband passed in 2012 and symptoms only slightly since then.  The new problem x 1-2 months is a variable intensity and duration (some times as little as less than a min vs lasts for hours)  L >R ant lower chest discomfort in sitting or standing positions mostly, comes an goes without a pattern but never supine, rad some to back so this lead to CT abd which showed a 1 cm renal lesion on L and several < 5mm lung nodulesso was referred to [place] by Dr Collins [place].  She does admit to marked wt loss p husband died and change in diet but no sign abd complaints and no pleuritic cp or h/o hemoptysis.  No obvious pattern daytime variabilty or assoc chronic cough or cp or chest tightness, subjective wheeze overt sinus or hb symptoms. No unusual exp hx or h/o childhood pna/ asthma or premature birth to her  knowledge.   Sleeping ok without nocturnal  or early am exacerbation  of respiratory  c/o's or need for noct saba. Also denies any obvious fluctuation of symptoms with weather or environmental changes or other aggravating or alleviating factors except as outlined above     Review of Systems  Constitutional: Negative for fever and unexpected weight change.  HENT: Negative for ear pain, nosebleeds, congestion, sore throat, rhinorrhea, sneezing, trouble swallowing, dental problem, postnasal drip and sinus pressure.   Eyes: Negative for redness and itching.  Respiratory: Positive for shortness of breath. Negative for cough, chest tightness and wheezing.   Cardiovascular: Negative for palpitations and leg swelling.  Gastrointestinal: Negative for nausea and vomiting.  Genitourinary:  Negative for dysuria.  Musculoskeletal: Negative for joint swelling.  Skin: Negative for rash.  Neurological: Negative for headaches.  Hematological: Does not bruise/bleed easily.  Psychiatric/Behavioral: Negative for dysphoric mood. The patient is not nervous/anxious.        Objective:   Physical Exam   Pleasant wf nad ,full dentures. Wt Readings from Last 3 Encounters:  04/23/12 141 lb (63.957 kg)  02/16/11 115 lb (52.164 kg)  02/16/11 115 lb (52.164 kg)    HEENT mild turbinate edema.  Oropharynx no thrush or excess pnd or cobblestoning.  No JVD or cervical adenopathy. Mild accessory muscle hypertrophy. Trachea midline, nl thryroid. Chest was hyperinflated by percussion with diminished breath sounds and moderate increased exp time without wheeze. Hoover sign positive at mid inspiration. Regular rate and rhythm without murmur gallop or rub or increase P2 or edema.  Abd: no hsm, nl excursion. Ext warm without cyanosis or clubbing.        Assessment & Plan:

## 2012-04-23 NOTE — Patient Instructions (Addendum)
Classic subdiaphragmatic pain pattern suggests ibs:  Stereotypical, migratory with a very limited distribution of pain locations, daytime, not exacerbated by ex or coughing, worse in sitting position,  Not as bad supine due to the dome effect of the diaphragm is  canceled in that position. Frequently these patients have had multiple negative GI workups and CT scans.  Treatment consists of avoiding foods that cause gas (especially beans, boiled eggs,  and raw vegetables like spinach and salads)  and citrucel 1 heaping tsp twice daily with a large glass of water.  Pain should improve w/in 2 weeks and if not then consider further GI work up.   Please schedule a follow up office visit in 4 weeks, sooner if needed with pft's and cxr

## 2012-04-24 DIAGNOSIS — R918 Other nonspecific abnormal finding of lung field: Secondary | ICD-10-CM | POA: Insufficient documentation

## 2012-04-24 NOTE — Assessment & Plan Note (Signed)
Classic subdiaphragmatic pain pattern suggests ibs:  Stereotypical, migratory with a very limited distribution of pain locations, daytime, not exacerbated by ex or coughing, worse in sitting position, associated with generalized abd bloating, not present supine due to the dome effect of the diaphragm is  canceled in that position. Frequently these patients have had multiple negative GI workups and CT scans.  Treatment consists of avoiding foods that cause gas (especially beans and raw vegetables like spinach and salads)  and citrucel 1 heaping tsp twice daily with a large glass of water.  Pain should improve w/in 2 weeks if this is the cause - strongly doubt the renal lesion has anything to do with the pain pattern she's describing.Marland Kitchen

## 2012-04-24 NOTE — Assessment & Plan Note (Addendum)
She clinically has mod copd but really not limited from desired activities and no tendency to exac typical of ecopd.  Would like to get baseline cxr/ pft's but do not rec meds for now

## 2012-04-24 NOTE — Assessment & Plan Note (Addendum)
Although there are clearly abnormalities on CT scan, they should probably be considered "microscopic" since not obvious on plain cxr .     In the setting of obvious "macroscopic" health issues,  I am very reluctatnt to embark on an invasive w/u at this point but will arrange consevative  follow up and in the meantime see what we can do to address the patient's subjective concerns.    Placed in computer for recall at 6 months

## 2012-05-24 ENCOUNTER — Other Ambulatory Visit: Payer: Self-pay | Admitting: Gastroenterology

## 2012-05-24 DIAGNOSIS — R1310 Dysphagia, unspecified: Secondary | ICD-10-CM

## 2012-05-28 ENCOUNTER — Ambulatory Visit
Admission: RE | Admit: 2012-05-28 | Discharge: 2012-05-28 | Disposition: A | Discharge status: Home / Self Care | Payer: Medicare Other | Source: Ambulatory Visit | Attending: Gastroenterology | Admitting: Gastroenterology

## 2012-05-28 DIAGNOSIS — R1310 Dysphagia, unspecified: Secondary | ICD-10-CM

## 2012-05-30 ENCOUNTER — Ambulatory Visit (INDEPENDENT_AMBULATORY_CARE_PROVIDER_SITE_OTHER)
Admission: RE | Admit: 2012-05-30 | Discharge: 2012-05-30 | Disposition: A | Discharge status: Home / Self Care | Payer: Medicare Other | Source: Ambulatory Visit | Attending: Internal Medicine | Admitting: Internal Medicine

## 2012-05-30 ENCOUNTER — Ambulatory Visit (INDEPENDENT_AMBULATORY_CARE_PROVIDER_SITE_OTHER): Payer: Medicare Other | Admitting: Internal Medicine

## 2012-05-30 ENCOUNTER — Encounter: Payer: Self-pay | Admitting: Internal Medicine

## 2012-05-30 ENCOUNTER — Telehealth: Payer: Self-pay | Admitting: Internal Medicine

## 2012-05-30 VITALS — BP 108/60 | HR 80 | Temp 98.0°F | Ht 64.0 in | Wt 111.0 lb

## 2012-05-30 DIAGNOSIS — R918 Other nonspecific abnormal finding of lung field: Secondary | ICD-10-CM

## 2012-05-30 DIAGNOSIS — J449 Chronic obstructive pulmonary disease, unspecified: Secondary | ICD-10-CM

## 2012-05-30 LAB — PULMONARY FUNCTION TEST

## 2012-05-30 MED ORDER — BUDESONIDE-FORMOTEROL FUMARATE 160-4.5 MCG/ACT IN AERO: INHALATION_SPRAY | RESPIRATORY_TRACT | Status: DC

## 2012-05-30 NOTE — Progress Notes (Signed)
  Subjective:    Patient ID: AMOY STEEVES, female    DOB: 04/13/1935  MRN: [id number]      77 yowf quit smoking x around 2000  with mild doe got some better and leveled off but referred by Dr Herb Grays for eval of abn ct of abd with GOLD III COPD by pft's 05/30/2012   04/23/2012 1st pulmonary cc doe x heavy exertion not adl's used to do 5 mile walk with husband but stopped p husband passed in 2012 and symptoms only slightly since then.  The new problem x 1-2 months is a variable intensity and duration (some times as little as less than a min vs lasts for hours)  L >R ant lower chest discomfort in sitting or standing positions mostly, comes an goes without a pattern but never supine, rad some to back so this lead to CT abd which showed a 1 cm renal lesion on L and several < 5mm lung nodulesso was referred to [place] by Dr Collins [place].  She does admit to marked wt loss p husband died and change in diet but no sign abd complaints and no pleuritic cp or h/o hemoptysis. rec ibs rx  05/30/2012 f/u ov/Anija Brickner re copd GOLD III Chief Complaint  Patient presents with  . Followup with PFT    Breathing has improved some, no new co's today.    pain is better, some hoarseness  No obvious pattern daytime variabilty or assoc chronic cough or cp or chest tightness, subjective wheeze overt sinus or hb symptoms. No unusual exp hx or h/o childhood pna/ asthma or premature birth to her  knowledge.   Sleeping ok without nocturnal  or early am exacerbation  of respiratory  c/o's or need for noct saba. Also denies any obvious fluctuation of symptoms with weather or environmental changes or other aggravating or alleviating factors except as outlined above           Objective:   Physical Exam   Pleasant wf nad ,full dentures  Wt Readings from Last 3 Encounters:  05/30/12 111 lb (50.349 kg)  04/23/12 141 lb (63.957 kg)  02/16/11 115 lb (52.164 kg)      HEENT mild turbinate edema.  Oropharynx no thrush or excess pnd or  cobblestoning.  No JVD or cervical adenopathy. Mild accessory muscle hypertrophy. Trachea midline, nl thryroid. Chest was hyperinflated by percussion with diminished breath sounds and moderate increased exp time without wheeze. Hoover sign positive at mid inspiration. Regular rate and rhythm without murmur gallop or rub or increase P2 or edema.  Abd: no hsm, nl excursion. Ext warm without cyanosis or clubbing.    CXR  05/30/2012 :   COPD. No acute cardiopulmonary disease. No discrete pulmonary nodule is seen.     Assessment & Plan:

## 2012-05-30 NOTE — Telephone Encounter (Signed)
Pt is aware of CXR results.

## 2012-05-30 NOTE — Assessment & Plan Note (Addendum)
-   PFTs 05/30/2012  FEV1  0.77 (36%) ratio 66 and 23 % better p B2,  DLC0 75%  Moderate by ratio and not really limited by breathing  As I explained to this patient in detail:  although there may be significant copd present, it does not appear to be limiting activity tolerance any more than a set of worn tires limits someone from driving a car  around a parking lot.  A new set of Michelins might look good but would have no perceived impact on the performance of the car and would not be worth the cost.  The proper method of use, as well as anticipated side effects, of a metered-dose inhaler are discussed and demonstrated to the patient. Improved effectiveness after extensive coaching during this visit to a level of approximately  75% > so she can try symbicort to see if helpful but no compelling reason to be maintained unless addresses any limiting symptoms

## 2012-05-30 NOTE — Progress Notes (Signed)
PFT done today. 

## 2012-05-30 NOTE — Assessment & Plan Note (Signed)
No obvious abn on plain cxr, very poor operative candidate > Discussed in detail all the  indications, usual  risks and alternatives  relative to the benefits with patient who agrees to proceed with conservative f/u at 6 months as planned (already in computer for recall).

## 2012-05-30 NOTE — Patient Instructions (Addendum)
Symbicort 160 Take 2 puffs first thing in am and then another 2 puffs about 12 hours later take it helps you breath  Please remember to go to the x-ray department downstairs for your tests - we will call you with the results when they are available.  Please schedule a follow up visit in 6 months but call sooner if needed

## 2012-06-19 ENCOUNTER — Encounter: Payer: Self-pay | Admitting: Internal Medicine

## 2012-10-16 ENCOUNTER — Telehealth: Payer: Self-pay | Admitting: *Deleted

## 2012-10-16 DIAGNOSIS — R918 Other nonspecific abnormal finding of lung field: Secondary | ICD-10-CM

## 2012-10-16 NOTE — Telephone Encounter (Signed)
Message copied by Christen Butter on Wed Oct 16, 2012  5:16 PM ------      Message from: Sandrea Hughs B      Created: Tue Apr 23, 2012  4:17 PM       Needs ct chest no contrast f/u mpn ------

## 2012-10-16 NOTE — Telephone Encounter (Signed)
LMTCB for the pt 

## 2012-10-24 NOTE — Telephone Encounter (Signed)
Spoke with the pt and notified she is due for ct in Nov She verbalized understanding Order was sent to [facility]

## 2012-11-26 ENCOUNTER — Other Ambulatory Visit: Payer: Self-pay

## 2012-12-11 ENCOUNTER — Emergency Department (HOSPITAL_COMMUNITY): Payer: Medicare Other

## 2012-12-11 ENCOUNTER — Encounter (HOSPITAL_COMMUNITY): Payer: Self-pay | Admitting: Emergency Medicine

## 2012-12-11 ENCOUNTER — Observation Stay (HOSPITAL_COMMUNITY)
Admission: EM | Admit: 2012-12-11 | Discharge: 2012-12-14 | Disposition: A | Discharge status: Home / Self Care | Payer: Medicare Other | Attending: Surgery | Admitting: Surgery

## 2012-12-11 DIAGNOSIS — K8020 Calculus of gallbladder without cholecystitis without obstruction: Secondary | ICD-10-CM

## 2012-12-11 DIAGNOSIS — Z87891 Personal history of nicotine dependence: Secondary | ICD-10-CM | POA: Insufficient documentation

## 2012-12-11 DIAGNOSIS — R1084 Generalized abdominal pain: Secondary | ICD-10-CM | POA: Insufficient documentation

## 2012-12-11 DIAGNOSIS — R109 Unspecified abdominal pain: Secondary | ICD-10-CM

## 2012-12-11 DIAGNOSIS — K8050 Calculus of bile duct without cholangitis or cholecystitis without obstruction: Secondary | ICD-10-CM | POA: Diagnosis present

## 2012-12-11 DIAGNOSIS — J4489 Other specified chronic obstructive pulmonary disease: Secondary | ICD-10-CM | POA: Insufficient documentation

## 2012-12-11 DIAGNOSIS — K219 Gastro-esophageal reflux disease without esophagitis: Principal | ICD-10-CM | POA: Insufficient documentation

## 2012-12-11 DIAGNOSIS — K8010 Calculus of gallbladder with chronic cholecystitis without obstruction: Secondary | ICD-10-CM | POA: Insufficient documentation

## 2012-12-11 DIAGNOSIS — D649 Anemia, unspecified: Secondary | ICD-10-CM

## 2012-12-11 DIAGNOSIS — I4891 Unspecified atrial fibrillation: Secondary | ICD-10-CM | POA: Insufficient documentation

## 2012-12-11 DIAGNOSIS — Z79899 Other long term (current) drug therapy: Secondary | ICD-10-CM | POA: Insufficient documentation

## 2012-12-11 DIAGNOSIS — F419 Anxiety disorder, unspecified: Secondary | ICD-10-CM

## 2012-12-11 DIAGNOSIS — K5900 Constipation, unspecified: Secondary | ICD-10-CM | POA: Insufficient documentation

## 2012-12-11 DIAGNOSIS — E039 Hypothyroidism, unspecified: Secondary | ICD-10-CM | POA: Insufficient documentation

## 2012-12-11 DIAGNOSIS — I480 Paroxysmal atrial fibrillation: Secondary | ICD-10-CM

## 2012-12-11 DIAGNOSIS — J449 Chronic obstructive pulmonary disease, unspecified: Secondary | ICD-10-CM | POA: Insufficient documentation

## 2012-12-11 DIAGNOSIS — R1013 Epigastric pain: Secondary | ICD-10-CM

## 2012-12-11 HISTORY — DX: Other nonspecific abnormal finding of lung field: R91.8

## 2012-12-11 HISTORY — DX: Chronic obstructive pulmonary disease, unspecified: J44.9

## 2012-12-11 HISTORY — DX: Disorder of kidney and ureter, unspecified: N28.9

## 2012-12-11 HISTORY — DX: Esophageal obstruction: K22.2

## 2012-12-11 HISTORY — DX: Age-related osteoporosis without current pathological fracture: M81.0

## 2012-12-11 HISTORY — DX: Anxiety disorder, unspecified: F41.9

## 2012-12-11 HISTORY — DX: Unspecified osteoarthritis, unspecified site: M19.90

## 2012-12-11 HISTORY — DX: Paroxysmal atrial fibrillation: I48.0

## 2012-12-11 HISTORY — DX: Hyperlipidemia, unspecified: E78.5

## 2012-12-11 HISTORY — DX: Diaphragmatic hernia without obstruction or gangrene: K44.9

## 2012-12-11 LAB — URINALYSIS, ROUTINE W REFLEX MICROSCOPIC
Bilirubin Urine: NEGATIVE
Glucose, UA: NEGATIVE mg/dL
Hgb urine dipstick: NEGATIVE
Ketones, ur: 15 mg/dL — AB
Leukocytes, UA: NEGATIVE
Nitrite: NEGATIVE
Protein, ur: NEGATIVE mg/dL
Specific Gravity, Urine: 1.01 (ref 1.005–1.030)
Urobilinogen, UA: 1 mg/dL (ref 0.0–1.0)
pH: 7 (ref 5.0–8.0)

## 2012-12-11 LAB — COMPREHENSIVE METABOLIC PANEL
ALT: 51 U/L — ABNORMAL HIGH (ref 0–35)
AST: 36 U/L (ref 0–37)
Alkaline Phosphatase: 126 U/L — ABNORMAL HIGH (ref 39–117)
CO2: 28 mEq/L (ref 19–32)
Calcium: 9.5 mg/dL (ref 8.4–10.5)
Chloride: 97 mEq/L (ref 96–112)
GFR calc Af Amer: >90 mL/min (ref >90)
GFR calc non Af Amer: 86 mL/min — ABNORMAL LOW (ref >90)
Glucose, Bld: 93 mg/dL (ref 70–99)
Sodium: 135 mEq/L (ref 135–145)
Total Bilirubin: 0.7 mg/dL (ref 0.3–1.2)

## 2012-12-11 LAB — CG4 I-STAT (LACTIC ACID): Lactic Acid, Venous: 1.18 mmol/L (ref 0.5–2.2)

## 2012-12-11 LAB — CBC WITH DIFFERENTIAL/PLATELET
Basophils Absolute: 0 10*3/uL (ref 0.0–0.1)
Basophils Relative: 1 % (ref 0–1)
Eosinophils Absolute: 0.5 10*3/uL (ref 0.0–0.7)
Hemoglobin: 12.1 g/dL (ref 12.0–15.0)
MCH: 31.5 pg (ref 26.0–34.0)
MCHC: 33.9 g/dL (ref 30.0–36.0)
MCV: 93 fL (ref 78.0–100.0)
Monocytes Absolute: 0.6 10*3/uL (ref 0.1–1.0)
Monocytes Relative: 9 % (ref 3–12)
Neutro Abs: 3.8 10*3/uL (ref 1.7–7.7)
Neutrophils Relative %: 53 % (ref 43–77)
Platelets: 284 10*3/uL (ref 150–400)
RDW: 13.2 % (ref 11.5–15.5)
WBC: 7.1 10*3/uL (ref 4.0–10.5)

## 2012-12-11 LAB — LIPASE, BLOOD: Lipase: 16 U/L (ref 11–59)

## 2012-12-11 MED ORDER — HYDROCODONE-ACETAMINOPHEN 5-325 MG PO TABS: 1.0000 | ORAL_TABLET | ORAL | Status: DC | PRN

## 2012-12-11 MED ORDER — ONDANSETRON HCL 4 MG/2ML IJ SOLN
4.0000 mg | Freq: Four times a day (QID) | INTRAMUSCULAR | Status: DC | PRN
  Administered 2012-12-12: 4 mg via INTRAVENOUS
  Filled 2012-12-11: qty 2

## 2012-12-11 MED ORDER — ENOXAPARIN SODIUM 40 MG/0.4ML ~~LOC~~ SOLN
40.0000 mg | SUBCUTANEOUS | Status: DC
  Administered 2012-12-11 – 2012-12-13 (×3): 40 mg via SUBCUTANEOUS
  Filled 2012-12-11 (×5): qty 0.4

## 2012-12-11 MED ORDER — ONDANSETRON 4 MG PO TBDP: ORAL_TABLET | ORAL | Status: DC

## 2012-12-11 MED ORDER — SODIUM CHLORIDE 0.9 % IV BOLUS (SEPSIS)
500.0000 mL | Freq: Once | INTRAVENOUS | Status: AC
  Administered 2012-12-11: 500 mL via INTRAVENOUS

## 2012-12-11 MED ORDER — HYDROMORPHONE HCL PF 1 MG/ML IJ SOLN: 0.5000 mg | INTRAMUSCULAR | Status: DC | PRN

## 2012-12-11 MED ORDER — ONDANSETRON 4 MG PO TBDP
4.0000 mg | ORAL_TABLET | Freq: Once | ORAL | Status: AC
  Administered 2012-12-11: 4 mg via ORAL
  Filled 2012-12-11: qty 1

## 2012-12-11 MED ORDER — ACETAMINOPHEN 325 MG PO TABS
650.0000 mg | ORAL_TABLET | Freq: Once | ORAL | Status: AC
  Administered 2012-12-11: 650 mg via ORAL
  Filled 2012-12-11: qty 2

## 2012-12-11 MED ORDER — LEVOTHYROXINE SODIUM 112 MCG PO TABS
112.0000 ug | ORAL_TABLET | Freq: Every day | ORAL | Status: DC
  Administered 2012-12-13: 112 ug via ORAL
  Filled 2012-12-11 (×3): qty 1

## 2012-12-11 MED ORDER — FLUCONAZOLE 100 MG PO TABS
100.0000 mg | ORAL_TABLET | Freq: Every day | ORAL | Status: DC
  Administered 2012-12-12 – 2012-12-14 (×3): 100 mg via ORAL
  Filled 2012-12-11 (×3): qty 1

## 2012-12-11 MED ORDER — BUDESONIDE-FORMOTEROL FUMARATE 160-4.5 MCG/ACT IN AERO: 2.0000 | INHALATION_SPRAY | Freq: Two times a day (BID) | RESPIRATORY_TRACT | Status: DC

## 2012-12-11 MED ORDER — FENTANYL CITRATE 0.05 MG/ML IJ SOLN: 50.0000 ug | Freq: Once | INTRAMUSCULAR | Status: DC

## 2012-12-11 MED ORDER — BUDESONIDE-FORMOTEROL FUMARATE 160-4.5 MCG/ACT IN AERO
2.0000 | INHALATION_SPRAY | Freq: Two times a day (BID) | RESPIRATORY_TRACT | Status: DC | PRN
  Filled 2012-12-11: qty 6

## 2012-12-11 MED ORDER — HYDROCODONE-ACETAMINOPHEN 5-325 MG PO TABS
1.0000 | ORAL_TABLET | ORAL | Status: DC | PRN
  Administered 2012-12-12 – 2012-12-13 (×3): 1 via ORAL
  Filled 2012-12-11 (×3): qty 1

## 2012-12-11 MED ORDER — ALPRAZOLAM 0.25 MG PO TABS: 0.2500 mg | ORAL_TABLET | ORAL | Status: DC | PRN

## 2012-12-11 MED ORDER — SIMVASTATIN 40 MG PO TABS
40.0000 mg | ORAL_TABLET | Freq: Every day | ORAL | Status: DC
  Administered 2012-12-12 – 2012-12-13 (×2): 40 mg via ORAL
  Filled 2012-12-11 (×3): qty 1

## 2012-12-11 MED ORDER — CALCIUM CARBONATE 1250 (500 CA) MG PO TABS
1.0000 | ORAL_TABLET | Freq: Every day | ORAL | Status: DC
  Administered 2012-12-13: 500 mg via ORAL
  Filled 2012-12-11 (×3): qty 1

## 2012-12-11 NOTE — H&P (Signed)
Amy [name] is an 77 y.o. female.   Chief Complaint: abd pain HPI: The patient presents to the ED with upper abd pain that radiates to her back.  She states she has had similar pain for the past 6 months.  She underwent a CT scan, which was non-diagnostic.  She reports this episode of pain started on Fri.  She describes it as a wave of pain that starts in her upper abd and radiates to her mid back.  She states it feels like labor pain.  She is having occasional nausea and LOA.  She denies fevers or changes in bowel habits.  She denies dysuria but does have increased urinary urgency.  A recent UA was normal.  Her pain does not appear to be associated with meals.    Past Medical History  Diagnosis Date  . GERD (gastroesophageal reflux disease)   . Hypothyroidism   . Arthritis     Past Surgical History  Procedure Laterality Date  . Cataract extraction w/ intraocular lens  implant, bilateral  '09  . Abdominal hysterectomy    . Breast enhancement surgery  2006  . Esophageal dilation    . Knee arthroscopy  02/22/2011    Procedure: ARTHROSCOPY KNEE;  Surgeon: Loanne Drilling, MD;  Location: [facility];  Service: Orthopedics;  Laterality: Right;  WITH DEBRIDEMENT     Family History  Problem Relation Age of Onset  . Heart disease Father   . Rheum arthritis Father    Social History:  reports that she quit smoking about 15 years ago. Her smoking use included Cigarettes. She has a 30 pack-year smoking history. She does not have any smokeless tobacco history on file. She reports that she does not drink alcohol or use illicit drugs.  Allergies:  Allergies  Allergen Reactions  . Codeine     hallucination     (Not in a hospital admission)  Results for orders placed during the hospital encounter of 12/11/12 (from the past 48 hour(s))  URINALYSIS, ROUTINE W REFLEX MICROSCOPIC     Status: Abnormal   Collection Time    12/11/12  2:24 PM      Result Value Range   Color, Urine  YELLOW  YELLOW   APPearance CLEAR  CLEAR   Specific Gravity, Urine 1.010  1.005 - 1.030   pH 7.0  5.0 - 8.0   Glucose, UA NEGATIVE  NEGATIVE mg/dL   Hgb urine dipstick NEGATIVE  NEGATIVE   Bilirubin Urine NEGATIVE  NEGATIVE   Ketones, ur 15 (*) NEGATIVE mg/dL   Protein, ur NEGATIVE  NEGATIVE mg/dL   Urobilinogen, UA 1.0  0.0 - 1.0 mg/dL   Nitrite NEGATIVE  NEGATIVE   Leukocytes, UA NEGATIVE  NEGATIVE   Comment: MICROSCOPIC NOT DONE ON URINES WITH NEGATIVE PROTEIN, BLOOD, LEUKOCYTES, NITRITE, OR GLUCOSE <1000 mg/dL.  COMPREHENSIVE METABOLIC PANEL     Status: Abnormal   Collection Time    12/11/12  3:15 PM      Result Value Range   Sodium 135  135 - 145 mEq/L   Potassium 3.5  3.5 - 5.1 mEq/L   Chloride 97  96 - 112 mEq/L   CO2 28  19 - 32 mEq/L   Glucose, Bld 93  70 - 99 mg/dL   BUN 7  6 - 23 mg/dL   Creatinine, Ser 9.52  0.50 - 1.10 mg/dL   Calcium 9.5  8.4 - 84.1 mg/dL   Total Protein 7.3  6.0 - 8.3  g/dL   Albumin 3.8  3.5 - 5.2 g/dL   AST 36  0 - 37 U/L   ALT 51 (*) 0 - 35 U/L   Alkaline Phosphatase 126 (*) 39 - 117 U/L   Total Bilirubin 0.7  0.3 - 1.2 mg/dL   GFR calc non Af Amer 86 (*) >90 mL/min   GFR calc Af Amer >90  >90 mL/min   Comment: (NOTE)     The eGFR has been calculated using the CKD EPI equation.     This calculation has not been validated in all clinical situations.     eGFR's persistently <90 mL/min signify possible Chronic Kidney     Disease.  CBC WITH DIFFERENTIAL     Status: Abnormal   Collection Time    12/11/12  3:15 PM      Result Value Range   WBC 7.1  4.0 - 10.5 K/uL   RBC 3.84 (*) 3.87 - 5.11 MIL/uL   Hemoglobin 12.1  12.0 - 15.0 g/dL   HCT 21.3 (*) 08.6 - 57.8 %   MCV 93.0  78.0 - 100.0 fL   MCH 31.5  26.0 - 34.0 pg   MCHC 33.9  30.0 - 36.0 g/dL   RDW 46.9  62.9 - 52.8 %   Platelets 284  150 - 400 K/uL   Neutrophils Relative % 53  43 - 77 %   Neutro Abs 3.8  1.7 - 7.7 K/uL   Lymphocytes Relative 30  12 - 46 %   Lymphs Abs 2.1  0.7 - 4.0  K/uL   Monocytes Relative 9  3 - 12 %   Monocytes Absolute 0.6  0.1 - 1.0 K/uL   Eosinophils Relative 8 (*) 0 - 5 %   Eosinophils Absolute 0.5  0.0 - 0.7 K/uL   Basophils Relative 1  0 - 1 %   Basophils Absolute 0.0  0.0 - 0.1 K/uL  LIPASE, BLOOD     Status: None   Collection Time    12/11/12  3:15 PM      Result Value Range   Lipase 16  11 - 59 U/L  CG4 I-STAT (LACTIC ACID)     Status: None   Collection Time    12/11/12  4:05 PM      Result Value Range   Lactic Acid, Venous 1.18  0.5 - 2.2 mmol/L   Ct Head Wo Contrast  12/11/2012   CLINICAL DATA:  Head injury, nausea.  EXAM: CT HEAD WITHOUT CONTRAST  TECHNIQUE: Contiguous axial images were obtained from the base of the skull through the vertex without intravenous contrast.  COMPARISON:  None.  FINDINGS: Ventricles, cisterns and other CSF spaces are within normal. There is no mass, mass effect, shift of midline structures or acute hemorrhage. There is no evidence of acute infarction. Remaining bones and soft tissues are within normal.  IMPRESSION: No acute intracranial findings.   Electronically Signed   By: Elberta Fortis M.D.   On: 12/11/2012 16:32   US Abdomen Complete  12/11/2012   CLINICAL DATA:  77 year old female with abdominal pain.  EXAM: ULTRASOUND ABDOMEN COMPLETE  COMPARISON:  05/29/2012 renal ultrasound.  FINDINGS: Gallbladder:  Multiple mobile gallstones are noted, the largest measuring 7 mm. Upper limits normal wall thickness is noted measuring 3 mm. There is no evidence of pericholecystic fluid.  Common bile duct:  Diameter: 2.8 mm. There is no evidence of intrahepatic or extrahepatic biliary dilatation.  Liver:  No focal lesion identified. Within normal  limits in parenchymal echogenicity.  IVC:  No abnormality visualized.  Pancreas:  Visualized portion unremarkable.  Spleen:  Size and appearance within normal limits.  Right Kidney:  Length: 8.3 cm. Echogenicity within normal limits. No mass or hydronephrosis visualized.  Left  Kidney:  Length: 9.2 cm. Echogenicity within normal limits. No mass or hydronephrosis visualized.  Abdominal aorta:  No aneurysm visualized.  Other findings:  There is no evidence of ascites.  IMPRESSION: Cholelithiasis with upper limits normal gallbladder wall thickness - equivocal for acute cholecystitis. No evidence of biliary dilatation.  No other significant abnormalities identified.   Electronically Signed   By: Laveda Abbe M.D.   On: 12/11/2012 18:27    Review of Systems  Constitutional: Positive for malaise/fatigue. Negative for fever, chills and weight loss.  Respiratory: Negative for cough and shortness of breath.   Cardiovascular: Negative for chest pain.  Gastrointestinal: Positive for nausea and abdominal pain. Negative for heartburn, vomiting, diarrhea, constipation, blood in stool and melena.  Genitourinary: Positive for urgency, frequency and flank pain. Negative for dysuria and hematuria.  Musculoskeletal: Positive for back pain. Negative for myalgias.  Skin: Negative for rash.  Neurological: Negative for dizziness and headaches.    Blood pressure 141/86, pulse 89, temperature 98.8 F (37.1 C), temperature source Oral, resp. rate 16, SpO2 96.00%. Physical Exam  Constitutional: She is oriented to person, place, and time. She appears well-developed and well-nourished. No distress.  HENT:  Head: Normocephalic and atraumatic.  Eyes: Conjunctivae are normal. Pupils are equal, round, and reactive to light.  Neck: Normal range of motion.  Cardiovascular: Normal rate and regular rhythm.   Respiratory: Effort normal and breath sounds normal. No respiratory distress.  GI: Soft. She exhibits no distension. There is tenderness.  Diffuse tenderness to palpation in upper abd, worse in RUQ  Neurological: She is alert and oriented to person, place, and time.  Skin: Skin is warm and dry. She is not diaphoretic.    RUQ US IMPRESSION:  Cholelithiasis with upper limits normal gallbladder wall  thickness - equivocal for acute cholecystitis. No evidence of biliary dilatation.   Lab Results  Component Value Date   WBC 7.1 12/11/2012   HGB 12.1 12/11/2012   HCT 35.7* 12/11/2012   MCV 93.0 12/11/2012   PLT 284 12/11/2012   Lab Results  Component Value Date   ALT 51* 12/11/2012   AST 36 12/11/2012   ALKPHOS 126* 12/11/2012   BILITOT 0.7 12/11/2012    Assessment/Plan MARQUESHA ROBIDEAU is a 77 y.o. F with vague upper abd pain and gallstones.  Her exam seems consistent with biliary colic.  Pt states pain uncontrolled with medications.  Will admit for possible cholecystectomy in AM.  Jahara Dail C. 12/11/2012, 6:31 PM

## 2012-12-11 NOTE — ED Notes (Signed)
Pt states that she has been having abd pain that radiates to her back been going on for about 6 months and her PCP cant tell her why. Pt states the pain is hurting a little bit now but not like the pain was on Friday. Pt states that she also fell on Monday by tripping over uneven sidewalk hitting her head and left knee. Pt states that she "wants to be checked out from head to toe, I dont care how much it cost".

## 2012-12-11 NOTE — ED Notes (Signed)
MD at bedside. 

## 2012-12-11 NOTE — ED Notes (Signed)
Patient transported to Ultrasound. [place] at [facility]

## 2012-12-11 NOTE — ED Notes (Signed)
Warm washed clothe applied to infiltrated IV site.

## 2012-12-11 NOTE — BH Assessment (Signed)
This patient has been admitted to the medical floor. Confirmed with Landscape architect.   Glorious Peach, MS, LCASA Assessment Counselor

## 2012-12-11 NOTE — ED Provider Notes (Signed)
CSN: [id number]     Arrival date & time 12/11/12  1255 History   First MD Initiated Contact with Patient 12/11/12 1335     Chief Complaint  Patient presents with  . Abdominal Pain   (Consider location/radiation/quality/duration/timing/severity/associated sxs/prior Treatment) HPI Comments: 77 yo female with COPD, no etoh, past smoker presents with intermittent upper abd pain the past two weeks, two episodes lasting approx 1 hr, at times after eating, no current pain.  No fevers.  Mild nausea.  Ache sensation with mild back radiation.  No known gb hx.  Pt has CT this year.  Pt also tripped and hit her head anterior yesterday, mild nausea since, no blood thinners.  Patient is a 77 y.o. female presenting with abdominal pain. The history is provided by the patient.  Abdominal Pain Associated symptoms: nausea   Associated symptoms: no chest pain, no chills, no dysuria, no fever, no shortness of breath and no vomiting     Past Medical History  Diagnosis Date  . GERD (gastroesophageal reflux disease)   . Hypothyroidism   . Arthritis    Past Surgical History  Procedure Laterality Date  . Cataract extraction w/ intraocular lens  implant, bilateral  '09  . Abdominal hysterectomy    . Breast enhancement surgery  2006  . Esophageal dilation    . Knee arthroscopy  02/22/2011    Procedure: ARTHROSCOPY KNEE;  Surgeon: Loanne Drilling, MD;  Location: [facility];  Service: Orthopedics;  Laterality: Right;  WITH DEBRIDEMENT    Family History  Problem Relation Age of Onset  . Heart disease Father   . Rheum arthritis Father    History  Substance Use Topics  . Smoking status: Former Smoker -- 1.00 packs/day for 30 years    Types: Cigarettes    Quit date: 01/09/1997  . Smokeless tobacco: Not on file  . Alcohol Use: No   OB History   Grav Para Term Preterm Abortions TAB SAB Ect Mult Living                 Review of Systems  Constitutional: Negative for fever and chills.   HENT: Negative for congestion.   Eyes: Negative for visual disturbance.  Respiratory: Negative for shortness of breath.   Cardiovascular: Negative for chest pain.  Gastrointestinal: Positive for nausea and abdominal pain. Negative for vomiting.  Genitourinary: Negative for dysuria and flank pain.  Musculoskeletal: Positive for back pain. Negative for neck pain and neck stiffness.  Skin: Negative for rash.  Neurological: Negative for light-headedness and headaches.    Allergies  Codeine  Home Medications   Current Outpatient Rx  Name  Route  Sig  Dispense  Refill  . ALPRAZolam (XANAX) 0.25 MG tablet   Oral   Take 1 tablet by mouth as needed for anxiety or sleep.          . budesonide-formoterol (SYMBICORT) 160-4.5 MCG/ACT inhaler   Inhalation   Inhale 2 puffs into the lungs 2 (two) times daily as needed (shortness of breath).         . calcium carbonate (OS-CAL - DOSED IN MG OF ELEMENTAL CALCIUM) 1250 MG tablet   Oral   Take 1 tablet by mouth daily.         . fluconazole (DIFLUCAN) 100 MG tablet   Oral   Take 100 mg by mouth daily.         Marland Kitchen levothyroxine (SYNTHROID, LEVOTHROID) 112 MCG tablet   Oral   Take 112  mcg by mouth daily.         . pravastatin (PRAVACHOL) 80 MG tablet   Oral   Take 1 tablet by mouth daily.         . traMADol (ULTRAM) 50 MG tablet   Oral   Take 50 mg by mouth every 6 (six) hours as needed for moderate pain or severe pain.           BP 141/86  Pulse 89  Temp(Src) 98.8 F (37.1 C) (Oral)  Resp 16  SpO2 96% Physical Exam  Nursing note and vitals reviewed. Constitutional: She is oriented to person, place, and time. She appears well-developed and well-nourished.  HENT:  Head: Normocephalic and atraumatic.  Eyes: Conjunctivae are normal. Right eye exhibits no discharge. Left eye exhibits no discharge.  Neck: Normal range of motion. Neck supple. No tracheal deviation present.  Cardiovascular: Normal rate and regular rhythm.    Pulmonary/Chest: Effort normal and breath sounds normal.  Abdominal: Soft. She exhibits no distension. There is tenderness (mild epig). There is no guarding.  Musculoskeletal: She exhibits no edema.  Neurological: She is alert and oriented to person, place, and time.  5+ strength in UE and LE with f/e at major joints. Sensation to palpation intact in UE and LE. CNs 2-12 grossly intact.  EOMFI.  PERRL.   Finger nose and coordination intact bilateral.   Visual fields intact to finger testing.   Skin: Skin is warm. Rash (abrasion anterior forehead, no midline neck pain, full rom neck) noted.  Psychiatric: She has a normal mood and affect.    ED Course  Procedures (including critical care time)  EMERGENCY DEPARTMENT BILIARY ULTRASOUND INTERPRETATION "Study: Limited Abdominal Ultrasound of the gallbladder and common bile duct."  INDICATIONS: RUQ pain and Nausea Indication: Multiple views of the gallbladder and common bile duct were obtained in real-time with a Multi-frequency probe." PERFORMED BY:  Myself IMAGES ARCHIVED?: Yes FINDINGS: Gallstones present, Gallbladder wall normal in thickness, Sonographic Murphy's sign absent and Common bile duct normal in size LIMITATIONS: Bowel Gas INTERPRETATION: Cholelithiasis  EMERGENCY DEPARTMENT ULTRASOUND  Study: Limited Retroperitoneal Ultrasound of the Abdominal Aorta.  INDICATIONS:Abdominal pain, Back pain and Age>55 Multiple views of the abdominal aorta were obtained in real-time from the diaphragmatic hiatus to the aortic bifurcation in transverse planes with a multi-frequency probe. PERFORMED BY: Myself IMAGES ARCHIVED?: Yes FINDINGS: Maximum aortic dimensions are 2.2 cm LIMITATIONS:  Bowel gas INTERPRETATION:  No abdominal aortic aneurysm    Labs Review Labs Reviewed  URINALYSIS, ROUTINE W REFLEX MICROSCOPIC - Abnormal; Notable for the following:    Ketones, ur 15 (*)    All other components within normal limits  COMPREHENSIVE  METABOLIC PANEL - Abnormal; Notable for the following:    ALT 51 (*)    Alkaline Phosphatase 126 (*)    GFR calc non Af Amer 86 (*)    All other components within normal limits  CBC WITH DIFFERENTIAL - Abnormal; Notable for the following:    RBC 3.84 (*)    HCT 35.7 (*)    Eosinophils Relative 8 (*)    All other components within normal limits  LIPASE, BLOOD  CG4 I-STAT (LACTIC ACID)   Imaging Review No results found.  EKG Interpretation   None       MDM   1. Epigastric pain   2. Cholelithiasis    Concern for biliary vs AAA.  Bedside US normal aorta, multiple gallstones without thickening or fluid. No pain currently. Discussed close outpt  fup and to discuss options with surgery.  On recheck, nausea worsening.  Discussed with G surgery with concern for GB related with sxs and gallstones. Surgery evaluated and will admit, they requested formal US.  Cholelithiasis, Epig pain      Enid Skeens, MD 12/11/12 [phone]

## 2012-12-11 NOTE — ED Notes (Signed)
LABS ARE IN PROCESS

## 2012-12-12 ENCOUNTER — Encounter (HOSPITAL_COMMUNITY): Payer: Self-pay | Admitting: Internal Medicine

## 2012-12-12 ENCOUNTER — Observation Stay (HOSPITAL_COMMUNITY): Payer: Medicare Other

## 2012-12-12 DIAGNOSIS — M810 Age-related osteoporosis without current pathological fracture: Secondary | ICD-10-CM | POA: Insufficient documentation

## 2012-12-12 DIAGNOSIS — K8020 Calculus of gallbladder without cholecystitis without obstruction: Secondary | ICD-10-CM

## 2012-12-12 DIAGNOSIS — D649 Anemia, unspecified: Secondary | ICD-10-CM

## 2012-12-12 DIAGNOSIS — M1990 Unspecified osteoarthritis, unspecified site: Secondary | ICD-10-CM | POA: Insufficient documentation

## 2012-12-12 DIAGNOSIS — N289 Disorder of kidney and ureter, unspecified: Secondary | ICD-10-CM | POA: Insufficient documentation

## 2012-12-12 DIAGNOSIS — R1013 Epigastric pain: Secondary | ICD-10-CM

## 2012-12-12 DIAGNOSIS — R109 Unspecified abdominal pain: Secondary | ICD-10-CM

## 2012-12-12 DIAGNOSIS — F411 Generalized anxiety disorder: Secondary | ICD-10-CM

## 2012-12-12 DIAGNOSIS — F419 Anxiety disorder, unspecified: Secondary | ICD-10-CM | POA: Insufficient documentation

## 2012-12-12 DIAGNOSIS — E785 Hyperlipidemia, unspecified: Secondary | ICD-10-CM | POA: Insufficient documentation

## 2012-12-12 DIAGNOSIS — K219 Gastro-esophageal reflux disease without esophagitis: Secondary | ICD-10-CM

## 2012-12-12 DIAGNOSIS — K222 Esophageal obstruction: Secondary | ICD-10-CM | POA: Insufficient documentation

## 2012-12-12 DIAGNOSIS — E039 Hypothyroidism, unspecified: Secondary | ICD-10-CM | POA: Diagnosis present

## 2012-12-12 DIAGNOSIS — R918 Other nonspecific abnormal finding of lung field: Secondary | ICD-10-CM | POA: Insufficient documentation

## 2012-12-12 DIAGNOSIS — E876 Hypokalemia: Secondary | ICD-10-CM

## 2012-12-12 DIAGNOSIS — J449 Chronic obstructive pulmonary disease, unspecified: Secondary | ICD-10-CM

## 2012-12-12 LAB — CBC
HCT: 33.6 % — ABNORMAL LOW (ref 36.0–46.0)
Hemoglobin: 11.6 g/dL — ABNORMAL LOW (ref 12.0–15.0)
MCHC: 34.5 g/dL (ref 30.0–36.0)
MCV: 93.3 fL (ref 78.0–100.0)
WBC: 6.3 10*3/uL (ref 4.0–10.5)

## 2012-12-12 LAB — COMPREHENSIVE METABOLIC PANEL
Alkaline Phosphatase: 114 U/L (ref 39–117)
BUN: 8 mg/dL (ref 6–23)
Chloride: 98 mEq/L (ref 96–112)
Creatinine, Ser: 0.68 mg/dL (ref 0.50–1.10)
GFR calc Af Amer: >90 mL/min (ref >90)
Glucose, Bld: 80 mg/dL (ref 70–99)
Potassium: 3.2 mEq/L — ABNORMAL LOW (ref 3.5–5.1)
Total Bilirubin: 0.7 mg/dL (ref 0.3–1.2)

## 2012-12-12 LAB — LIPASE, BLOOD: Lipase: 16 U/L (ref 11–59)

## 2012-12-12 MED ORDER — POTASSIUM CHLORIDE 10 MEQ/100ML IV SOLN
10.0000 meq | INTRAVENOUS | Status: AC
  Administered 2012-12-12 (×4): 10 meq via INTRAVENOUS
  Filled 2012-12-12 (×5): qty 100

## 2012-12-12 MED ORDER — DOCUSATE SODIUM 100 MG PO CAPS
100.0000 mg | ORAL_CAPSULE | Freq: Two times a day (BID) | ORAL | Status: DC
  Administered 2012-12-12 – 2012-12-13 (×2): 100 mg via ORAL
  Filled 2012-12-12 (×3): qty 1

## 2012-12-12 MED ORDER — BOOST / RESOURCE BREEZE PO LIQD
1.0000 | Freq: Two times a day (BID) | ORAL | Status: DC
  Administered 2012-12-12 – 2012-12-13 (×3): 1 via ORAL

## 2012-12-12 MED ORDER — GI COCKTAIL ~~LOC~~
30.0000 mL | Freq: Once | ORAL | Status: AC
  Administered 2012-12-12: 30 mL via ORAL
  Filled 2012-12-12: qty 30

## 2012-12-12 MED ORDER — POLYETHYLENE GLYCOL 3350 17 G PO PACK
17.0000 g | PACK | Freq: Every day | ORAL | Status: DC
  Administered 2012-12-12: 17 g via ORAL
  Filled 2012-12-12 (×2): qty 1

## 2012-12-12 MED ORDER — PANTOPRAZOLE SODIUM 40 MG IV SOLR
40.0000 mg | Freq: Two times a day (BID) | INTRAVENOUS | Status: DC
  Administered 2012-12-12 – 2012-12-13 (×3): 40 mg via INTRAVENOUS
  Filled 2012-12-12 (×5): qty 40

## 2012-12-12 MED ORDER — TECHNETIUM TC 99M MEBROFENIN IV KIT
4.7000 | PACK | Freq: Once | INTRAVENOUS | Status: AC | PRN
  Administered 2012-12-12: 5 via INTRAVENOUS

## 2012-12-12 MED ORDER — ACETAMINOPHEN 325 MG PO TABS: 650.0000 mg | ORAL_TABLET | ORAL | Status: DC | PRN

## 2012-12-12 NOTE — Progress Notes (Signed)
Subjective: Pt says she has pain allot, generalized abdominal pain.  She take tramadol 1-2 times per day for this.  She is not having nausea or vomiting.    Objective: Vital signs in last 24 hours: Temp:  [97.7 F (36.5 C)-98.8 F (37.1 C)] 98.4 F (36.9 C) (12/04 0550) Pulse Rate:  [66-89] 74 (12/04 0550) Resp:  [16] 16 (12/04 0550) BP: (124-156)/(61-86) 124/75 mmHg (12/04 0550) SpO2:  [93 %-96 %] 96 % (12/04 0550) Weight:  [56.6 kg (124 lb 12.5 oz)] 56.6 kg (124 lb 12.5 oz) (12/04 0550)  afebrile, VSS K+ 3.2 WBC is normal Intake/Output from previous day: 12/03 0701 - 12/04 0700 In: 170 [P.O.:170] Out: 700 [Urine:700] Intake/Output this shift:    General appearance: alert, cooperative, fatigued and uncooperative Resp: clear to auscultation bilaterally GI: soft, non-tender; bowel sounds normal; no masses,  no organomegaly and complains of generalized abdominal discomfort.  no local site.  Lab Results:   Recent Labs  12/11/12 1515 12/12/12 0818  WBC 7.1 6.3  HGB 12.1 11.6*  HCT 35.7* 33.6*  PLT 284 274    BMET  Recent Labs  12/11/12 1515 12/12/12 0818  NA 135 135  K 3.5 3.2*  CL 97 98  CO2 28 28  GLUCOSE 93 80  BUN 7 8  CREATININE 0.63 0.68  CALCIUM 9.5 9.2   PT/INR No results found for this basename: LABPROT, INR,  in the last 72 hours   Recent Labs Lab 12/11/12 1515 12/12/12 0818  AST 36 28  ALT 51* 39*  ALKPHOS 126* 114  BILITOT 0.7 0.7  PROT 7.3 6.8  ALBUMIN 3.8 3.3*     Lipase     Component Value Date/Time   LIPASE 16 12/11/2012 1515     Studies/Results: Ct Head Wo Contrast  12/11/2012   CLINICAL DATA:  Head injury, nausea.  EXAM: CT HEAD WITHOUT CONTRAST  TECHNIQUE: Contiguous axial images were obtained from the base of the skull through the vertex without intravenous contrast.  COMPARISON:  None.  FINDINGS: Ventricles, cisterns and other CSF spaces are within normal. There is no mass, mass effect, shift of midline structures or  acute hemorrhage. There is no evidence of acute infarction. Remaining bones and soft tissues are within normal.  IMPRESSION: No acute intracranial findings.   Electronically Signed   By: Elberta Fortis M.D.   On: 12/11/2012 16:32   US Abdomen Complete  12/11/2012   CLINICAL DATA:  77 year old female with abdominal pain.  EXAM: ULTRASOUND ABDOMEN COMPLETE  COMPARISON:  05/29/2012 renal ultrasound.  FINDINGS: Gallbladder:  Multiple mobile gallstones are noted, the largest measuring 7 mm. Upper limits normal wall thickness is noted measuring 3 mm. There is no evidence of pericholecystic fluid.  Common bile duct:  Diameter: 2.8 mm. There is no evidence of intrahepatic or extrahepatic biliary dilatation.  Liver:  No focal lesion identified. Within normal limits in parenchymal echogenicity.  IVC:  No abnormality visualized.  Pancreas:  Visualized portion unremarkable.  Spleen:  Size and appearance within normal limits.  Right Kidney:  Length: 8.3 cm. Echogenicity within normal limits. No mass or hydronephrosis visualized.  Left Kidney:  Length: 9.2 cm. Echogenicity within normal limits. No mass or hydronephrosis visualized.  Abdominal aorta:  No aneurysm visualized.  Other findings:  There is no evidence of ascites.  IMPRESSION: Cholelithiasis with upper limits normal gallbladder wall thickness - equivocal for acute cholecystitis. No evidence of biliary dilatation.  No other significant abnormalities identified.   Electronically Signed  By: Laveda Abbe M.D.   On: 12/11/2012 18:27    Medications: . calcium carbonate  1 tablet Oral Q lunch  . enoxaparin (LOVENOX) injection  40 mg Subcutaneous Q24H  . fluconazole  100 mg Oral Daily  . levothyroxine  112 mcg Oral QAC breakfast  . simvastatin  40 mg Oral q1800    Assessment/Plan Abdominal pain and cholelithiasis Chronic abdominal discomfort Hx of hiatal hernia Esophageal stricture Hypothyroid GERD Arthritis/ Right knee arthroscopy with meniscal debridement  02/2011 COPD/20 year hx of tobacco use Hx of AF (DR. Hochrein saw her in 2009) Hypertension  Fall last week (CT head was negative)  Plan:  HIDA scan today.  If that is negative she may need GI consult, Medicine consult to help with care.  I will get an EKG and we will talk to Medicine after we see HIDA results.    LOS: 1 day    Tilia Faso 12/12/2012

## 2012-12-12 NOTE — Progress Notes (Signed)
Transferred via wheelchair to room #1410 on [address]. Accompanied by NT and  [address] nurse. Belongings delivered with pt.

## 2012-12-12 NOTE — Consult Note (Signed)
[facility] Medical Consultation  Amy [name] ZOX:[id number] DOB: 01/16/1935 DOA: 12/11/2012 PCP: Herb Grays, MD   Requesting physician: Dr. Romie Levee Date of consultation:  12/12/12 Reason for consultation: Abdominal pain  Impression/Recommendations Active Problems:   Biliary colic   Active Problems:   Biliary colic  Epigastric pain: Differential diagnosis includes angina, symptomatic paroxysmal atrial fibrillation, peptic ulcer disease, gastritis, pneumonia, pneumothorax, IBS, symptomatic gallstones. No hematuria to suggest kidney stones. -  Troponins: Negative. Would likely be positive given severity of chest pain in the setting of heart attack. No needed continue checking. -Telemetry -GI cocktail -Protonix -Chest x-ray: No evidence of mass, pneumonia, pneumothorax -If there does not appear to be an association between atrial fibrillation and her episodes of pain, and if her pain were to recur and her troponin remains negative, she may have symptomatic gallstones or IBS.  -  Consider Bentyl  Constipation: -  Start senna, Colace, and MiraLax -  Minimize narcotics  Paroxysmal atrial fibrillation, and Chads2vasc score of 3  -  Currently sinus rhythm, however would qualify for long-term anticoagulation -Given her recent history of fall which required head CT, however, we will defer discussion of ongoing long-term anticoagulation to her primary care doctor   COPD, mild, chronic and stable. Continue to [facility]  Hypothyroidism, stable. Continue levothyroxine  Normocytic anemia, -  Occult stool -  Iron studies, B12, folate, TSH  Hypokalemia, likely secondary to restricted diet -  Oral repletion of KCl - Liberalize diet as tolerated  Please contact me if I can be of assistance in the meanwhile. Thank you for this consultation.  Chief Complaint: Abdominal pain  HPI:   The patient is a 77 year old female with history of paroxysmal atrial fibrillation, HLD, COPD,  acid reflux, esophageal strictures status post repeated dilations, hypothyroidism, osteoarthritis and osteoporosis, anxiety. For the past year she has reported intermittent epigastric and diffuse abdominal pain which lasts for a couple of hours and resolves either with bowel movement or spontaneously. She has had a CT scan of the abdomen and pelvis which did not demonstrate any significant abnormality that would explain her symptoms. The patient states that prior to admission, she developed sudden onset 10 out of 10 left and mid epigastric and left lower chest tightness or pressure which was associated with shortness of breath, nausea, diaphoresis, and palpitations.  She states that she had some radiation to the right upper abdomen into her back. She denies radiation to her neck, jaw, arms. The pain lasted for several hours and resolved on its own. She tried taking calm and Ultram, neither of which helped. She states she thought she might be having a heart attack, but did not want to call 911. She drove herself to her friend's house and her friend's assisted her with calling 911. She was brought to the emergency department where her labs demonstrated normal BMP, mildly elevated alkaline phosphatase at 126, ALT 51, total bilirubin 0.7, lipase 16. Her CBC with within normal limits except for mild anemia with hemoglobin of 12.1. Her lactic acid was normal. Urinalysis was negative.  She underwent an abdominal ultrasound which demonstrated cholelithiasis and a gallbladder wall thickness which was within normal limits but at the upper limit of normal. There is no evidence of biliary dilatation. She had a followup HIDA scan which was negative. She has been followed by general surgery who has been considering cholecystectomy for possible symptomatic gallstones, however, because her story is not perfectly typical they have asked for consultation to rule out  other diagnoses. Of note the patient denies indigestion and  heartburn symptoms, the sensation of food getting stuck in her throat or in her chest.  There may be some association with meals, with pain occurring approximately one hour after eating, but this is not always the case. She denies chronic constipation or diarrhea, however last week she was constipated and took Dulcolax. She denies dysuria.  She thinks she might have palpitations when she has these episodes of epigastric pain.  She states when she previously had her atrial fibrillation, she was asymptomatic, however because she was sick in the hospital with pneumonia, it was difficult to tell.   Pain may have been reproduced when she underwent her abdominal ultrasounds and the sonographer pressed on her right upper quadrant to look at her gallbladder.  Review of Systems:   Denies fevers, chills, weight loss or gain, changes to hearing and vision. Denies rhinorrhea, sinus congestion, sore throat.  Per history of present illness Denies wheezing, cough.  Denies nausea, vomiting, constipation, diarrhea.  Denies dysuria, frequency, urgency, polyuria, polydipsia.  Denies hematemesis, blood in stools, melena, abnormal bruising or bleeding.  Denies lymphadenopathy.  Chronic arthralgias, myalgias.  Denies skin rash or ulcer.  Denies lower extremity edema, orthopnea, PND.  Denies focal numbness, weakness, slurred speech, confusion, facial droop.  Denies anxiety and depression.    Past Medical History  Diagnosis Date  . GERD (gastroesophageal reflux disease)   . Hypothyroidism   . Arthritis   . Paroxysmal atrial fibrillation   . Renal lesion     1cm left kidney  . Osteoarthritis   . Osteoporosis   . COPD (chronic obstructive pulmonary disease)   . Hiatal hernia   . Esophageal stricture     s/p repeated dilations, Dr. Ewing Schlein  . Pulmonary nodules    Past Surgical History  Procedure Laterality Date  . Cataract extraction w/ intraocular lens  implant, bilateral  '09  . Abdominal hysterectomy    . Breast  enhancement surgery  2006  . Esophageal dilation    . Knee arthroscopy  02/22/2011    Procedure: ARTHROSCOPY KNEE;  Surgeon: Loanne Drilling, MD;  Location: [facility];  Service: Orthopedics;  Laterality: Right;  WITH DEBRIDEMENT    Social History:  reports that she quit smoking about 15 years ago. Her smoking use included Cigarettes. She has a 30 pack-year smoking history. She has never used smokeless tobacco. She reports that she does not drink alcohol or use illicit drugs.  Allergies  Allergen Reactions  . Codeine     hallucination   Family History  Problem Relation Age of Onset  . Heart disease Father   . Rheum arthritis Father   . Heart failure Mother   . Diabetes Brother     Prior to Admission medications   Medication Sig Start Date End Date Taking? Authorizing Provider  ALPRAZolam Prudy Feeler) 0.25 MG tablet Take 1 tablet by mouth as needed for anxiety or sleep.  03/26/12  Yes Historical Provider, MD  budesonide-formoterol (SYMBICORT) 160-4.5 MCG/ACT inhaler Inhale 2 puffs into the lungs 2 (two) times daily as needed (shortness of breath). 05/30/12  Yes Nyoka Cowden, MD  calcium carbonate (OS-CAL - DOSED IN MG OF ELEMENTAL CALCIUM) 1250 MG tablet Take 1 tablet by mouth daily.   Yes Historical Provider, MD  fluconazole (DIFLUCAN) 100 MG tablet Take 100 mg by mouth daily.   Yes Historical Provider, MD  levothyroxine (SYNTHROID, LEVOTHROID) 112 MCG tablet Take 112 mcg by mouth daily.  Yes Historical Provider, MD  pravastatin (PRAVACHOL) 80 MG tablet Take 1 tablet by mouth daily. 04/21/12  Yes Historical Provider, MD  traMADol (ULTRAM) 50 MG tablet Take 50 mg by mouth every 6 (six) hours as needed for moderate pain or severe pain.    Yes Historical Provider, MD  HYDROcodone-acetaminophen (NORCO) 5-325 MG per tablet Take 1 tablet by mouth every 4 (four) hours as needed. 12/11/12   Enid Skeens, MD  ondansetron (ZOFRAN ODT) 4 MG disintegrating tablet 4mg  ODT q4 hours prn  nausea/vomit 12/11/12   Enid Skeens, MD   Physical Exam: Blood pressure 137/61, pulse 75, temperature 97.6 F (36.4 C), temperature source Oral, resp. rate 16, height 5\' 5"  (1.651 m), weight 56.6 kg (124 lb 12.5 oz), SpO2 98.00%. Filed Vitals:   12/12/12 0550 12/12/12 0930 12/12/12 1405 12/12/12 1742  BP: 124/75 147/75 150/72 137/61  Pulse: 74 69 81 75  Temp: 98.4 F (36.9 C) 97.7 F (36.5 C) 98.3 F (36.8 C) 97.6 F (36.4 C)  TempSrc: Oral   Oral  Resp: 16 16 18 16   Height: 5\' 5"  (1.651 m)     Weight: 56.6 kg (124 lb 12.5 oz)     SpO2: 96% 92% 92% 98%     General:  Thin Caucasian female, no acute distress  Eyes:  PERRL, anicteric, non-injected.  ENT:  Nares clear.  OP clear, non-erythematous without plaques or exudates.  MMM.  Neck:  Supple without TM or JVD.    Lymph:  No cervical, supraclavicular, or submandibular LAD.  Cardiovascular:  RRR, normal S1, S2, without m/r/g.  2+ pulses, warm extremities  Respiratory:  CTA bilaterally without increased WOB.  Abdomen:  NABS.  Soft, ND/NT.    Skin:  No rashes or focal lesions.  Musculoskeletal:  Normal bulk and tone.  No LE edema.  Psychiatric:  A & O x 4.  Appropriate affect.  Neurologic:  CN 3-12 intact.  5/5 strength.  Sensation intact.  Labs on Admission:  Basic Metabolic Panel:  Recent Labs Lab 12/11/12 1515 12/12/12 0818  NA 135 135  K 3.5 3.2*  CL 97 98  CO2 28 28  GLUCOSE 93 80  BUN 7 8  CREATININE 0.63 0.68  CALCIUM 9.5 9.2   Liver Function Tests:  Recent Labs Lab 12/11/12 1515 12/12/12 0818  AST 36 28  ALT 51* 39*  ALKPHOS 126* 114  BILITOT 0.7 0.7  PROT 7.3 6.8  ALBUMIN 3.8 3.3*    Recent Labs Lab 12/11/12 1515 12/12/12 0818  LIPASE 16 16   No results found for this basename: AMMONIA,  in the last 168 hours CBC:  Recent Labs Lab 12/11/12 1515 12/12/12 0818  WBC 7.1 6.3  NEUTROABS 3.8  --   HGB 12.1 11.6*  HCT 35.7* 33.6*  MCV 93.0 93.3  PLT 284 274   Cardiac  Enzymes:  Recent Labs Lab 12/12/12 1549  TROPONINI <0.30   BNP: No components found with this basename: POCBNP,  CBG: No results found for this basename: GLUCAP,  in the last 168 hours  Radiological Exams on Admission: Ct Head Wo Contrast  12/11/2012   CLINICAL DATA:  Head injury, nausea.  EXAM: CT HEAD WITHOUT CONTRAST  TECHNIQUE: Contiguous axial images were obtained from the base of the skull through the vertex without intravenous contrast.  COMPARISON:  None.  FINDINGS: Ventricles, cisterns and other CSF spaces are within normal. There is no mass, mass effect, shift of midline structures or acute hemorrhage. There is  no evidence of acute infarction. Remaining bones and soft tissues are within normal.  IMPRESSION: No acute intracranial findings.   Electronically Signed   By: Elberta Fortis M.D.   On: 12/11/2012 16:32   Nm Hepatobiliary  12/12/2012   CLINICAL DATA:  Abdominal pain and nausea ; known cholelithiasis  EXAM: NUCLEAR MEDICINE HEPATOBILIARY IMAGING  Views: Anterior right upper quadrant  Radionuclide: Technetium 1m Choletec  Dose:  4.7 mCi  Route of administration: Intravenous  COMPARISON:  Abdominal ultrasound December 11, 2012  FINDINGS: Liver uptake of radiotracer is normal. There is prompt visualization of gallbladder and small bowel, indicating patency of the cystic and common bile ducts.  IMPRESSION: Study within normal limits.   Electronically Signed   By: Bretta Bang M.D.   On: 12/12/2012 12:20   US Abdomen Complete  12/11/2012   CLINICAL DATA:  77 year old female with abdominal pain.  EXAM: ULTRASOUND ABDOMEN COMPLETE  COMPARISON:  05/29/2012 renal ultrasound.  FINDINGS: Gallbladder:  Multiple mobile gallstones are noted, the largest measuring 7 mm. Upper limits normal wall thickness is noted measuring 3 mm. There is no evidence of pericholecystic fluid.  Common bile duct:  Diameter: 2.8 mm. There is no evidence of intrahepatic or extrahepatic biliary dilatation.  Liver:   No focal lesion identified. Within normal limits in parenchymal echogenicity.  IVC:  No abnormality visualized.  Pancreas:  Visualized portion unremarkable.  Spleen:  Size and appearance within normal limits.  Right Kidney:  Length: 8.3 cm. Echogenicity within normal limits. No mass or hydronephrosis visualized.  Left Kidney:  Length: 9.2 cm. Echogenicity within normal limits. No mass or hydronephrosis visualized.  Abdominal aorta:  No aneurysm visualized.  Other findings:  There is no evidence of ascites.  IMPRESSION: Cholelithiasis with upper limits normal gallbladder wall thickness - equivocal for acute cholecystitis. No evidence of biliary dilatation.  No other significant abnormalities identified.   Electronically Signed   By: Laveda Abbe M.D.   On: 12/11/2012 18:27   Dg Chest Port 1 View  12/12/2012   CLINICAL DATA:  Cough, acute abdominal and back pain, former smoker  EXAM: PORTABLE CHEST - 1 VIEW  COMPARISON:  05/30/2012; 06/08/2007; 06/23/2005  FINDINGS: Grossly unchanged borderline enlarged cardiac silhouette. Atherosclerotic plaque within the thoracic aorta. The lungs remain hyperexpanded with flattening of the bilateral hemidiaphragms and mild diffuse slightly nodular thickening of the pulmonary interstitium. No focal airspace opacities. No pleural effusion or pneumothorax. No definite evidence of edema. There is persistent mild elevation of the right hemidiaphragm. Unchanged bones.  IMPRESSION: Hyperexpanded lungs and bronchitic change without acute cardiopulmonary disease.   Electronically Signed   By: Simonne Come M.D.   On: 12/12/2012 16:41    EKG: Independently reviewed. Normal sinus rhythm with evidence of left atrial dilation with sinusoidal T waves in V1  Time spent: 75 min  Tylea Hise [facility] Pager [phone]  If 7PM-7AM, please contact night-coverage [email or web address] Password [email or web address] 12/12/2012, 5:55 PM

## 2012-12-12 NOTE — Progress Notes (Signed)
INITIAL NUTRITION ASSESSMENT  DOCUMENTATION CODES Per approved criteria  -Not Applicable   INTERVENTION: Provide Resource breeze BID until diet is advanced Provide Ensure Complete BID when diet is advanced Encourage PO intake  NUTRITION DIAGNOSIS: Inadequate oral intake related to poor appetite as evidenced by pt's report.   Goal: Pt to meet >/= 90% of their estimated nutrition needs   Monitor:  Diet advancement PO intake Weight Labs  Reason for Assessment: Malnutrition Screening Tool, score of 2  77 y.o. female  Admitting Dx: <principal problem not specified>  ASSESSMENT: 77 year old female with history of GERD, esophageal stricture, and hypertension presents to the ED with upper abd pain that radiates to her back.   Pt reports having a very poor appetite and not eating well PTA. Pt states that ever since her husband died 2 years ago she had had little desire to eat and has not been taking good care of herself. Pt also reports difficulty eating due to ongoing thrush since July that has not responded well to treatment. She states she usually weighs 121 lbs and lost down to 106 lbs; she started forcing herself to eat and started drinking Ensure supplements and has since gained weight back. Pt states she typically skips breakfast and sometimes skips an additional meal. Encouraged pt to snack more often and continue drinking Ensure supplements if she skips a meal.   Height: Ht Readings from Last 1 Encounters:  12/12/12 5\' 5"  (1.651 m)    Weight: Wt Readings from Last 1 Encounters:  12/12/12 124 lb 12.5 oz (56.6 kg)    Ideal Body Weight: 125 lbs  % Ideal Body Weight: 99%  Wt Readings from Last 10 Encounters:  12/12/12 124 lb 12.5 oz (56.6 kg)  05/30/12 111 lb (50.349 kg)  04/23/12 141 lb (63.957 kg)  02/16/11 115 lb (52.164 kg)  02/16/11 115 lb (52.164 kg)    Usual Body Weight: 121 lbs   % Usual Body Weight: 102%  BMI:  Body mass index is 20.76  kg/(m^2).  Estimated Nutritional Needs: Kcal: 1400-1600 Protein: 65-75 grams Fluid: 1.4-1.6 L/day  Skin: intact; abrasions on knees  Diet Order: Clear Liquid  EDUCATION NEEDS: -No education needs identified at this time   Intake/Output Summary (Last 24 hours) at 12/12/12 1415 Last data filed at 12/12/12 0630  Gross per 24 hour  Intake    170 ml  Output    700 ml  Net   -530 ml    Last BM: PTA   Labs:   Recent Labs Lab 12/11/12 1515 12/12/12 0818  NA 135 135  K 3.5 3.2*  CL 97 98  CO2 28 28  BUN 7 8  CREATININE 0.63 0.68  CALCIUM 9.5 9.2  GLUCOSE 93 80    CBG (last 3)  No results found for this basename: GLUCAP,  in the last 72 hours  Scheduled Meds: . calcium carbonate  1 tablet Oral Q lunch  . enoxaparin (LOVENOX) injection  40 mg Subcutaneous Q24H  . fluconazole  100 mg Oral Daily  . levothyroxine  112 mcg Oral QAC breakfast  . potassium chloride  10 mEq Intravenous Q1 Hr x 4  . simvastatin  40 mg Oral q1800    Continuous Infusions:   Past Medical History  Diagnosis Date  . GERD (gastroesophageal reflux disease)   . Hypothyroidism   . Arthritis     Past Surgical History  Procedure Laterality Date  . Cataract extraction w/ intraocular lens  implant, bilateral  '  09  . Abdominal hysterectomy    . Breast enhancement surgery  2006  . Esophageal dilation    . Knee arthroscopy  02/22/2011    Procedure: ARTHROSCOPY KNEE;  Surgeon: Loanne Drilling, MD;  Location: [facility];  Service: Orthopedics;  Laterality: Right;  WITH DEBRIDEMENT     Ian Malkin RD, LDN Inpatient Clinical Dietitian Pager: [phone] After Hours Pager: [phone]

## 2012-12-12 NOTE — Progress Notes (Signed)
Report given to [place], RN on [place] regarding pt's current status. Pt to transfer to telemetry per medical MD order for cardiac monitoring. No current complaints of pain or nausea. VSS.

## 2012-12-12 NOTE — Progress Notes (Signed)
UR completed 

## 2012-12-13 ENCOUNTER — Observation Stay (HOSPITAL_COMMUNITY): Payer: Medicare Other

## 2012-12-13 DIAGNOSIS — E039 Hypothyroidism, unspecified: Secondary | ICD-10-CM

## 2012-12-13 DIAGNOSIS — I059 Rheumatic mitral valve disease, unspecified: Secondary | ICD-10-CM

## 2012-12-13 LAB — BASIC METABOLIC PANEL
BUN: 8 mg/dL (ref 6–23)
Calcium: 9.1 mg/dL (ref 8.4–10.5)
Creatinine, Ser: 0.68 mg/dL (ref 0.50–1.10)
GFR calc Af Amer: >90 mL/min (ref >90)
GFR calc non Af Amer: 83 mL/min — ABNORMAL LOW (ref >90)
Sodium: 136 mEq/L (ref 135–145)

## 2012-12-13 LAB — FOLATE RBC: RBC Folate: 710 ng/mL — ABNORMAL HIGH (ref >280)

## 2012-12-13 LAB — VITAMIN B12: Vitamin B-12: 397 pg/mL (ref 211–911)

## 2012-12-13 LAB — TSH: TSH: 16.426 u[IU]/mL — ABNORMAL HIGH (ref 0.350–4.500)

## 2012-12-13 MED ORDER — IOHEXOL 300 MG/ML  SOLN
50.0000 mL | Freq: Once | INTRAMUSCULAR | Status: AC | PRN
  Administered 2012-12-13: 16:00:00 50 mL via ORAL

## 2012-12-13 MED ORDER — LEVOTHYROXINE SODIUM 150 MCG PO TABS
150.0000 ug | ORAL_TABLET | Freq: Every day | ORAL | Status: DC
  Administered 2012-12-13 – 2012-12-14 (×2): 150 ug via ORAL
  Filled 2012-12-13 (×3): qty 1

## 2012-12-13 MED ORDER — IOHEXOL 300 MG/ML  SOLN
80.0000 mL | Freq: Once | INTRAMUSCULAR | Status: AC | PRN
  Administered 2012-12-13: 80 mL via INTRAVENOUS

## 2012-12-13 MED ORDER — POLYETHYLENE GLYCOL 3350 17 G PO PACK
17.0000 g | PACK | Freq: Every day | ORAL | Status: DC | PRN
  Filled 2012-12-13: qty 1

## 2012-12-13 NOTE — Progress Notes (Signed)
Subjective: She denies any pain now, she says her pain was in the LUQ  Objective: Vital signs in last 24 hours: Temp:  [97.6 F (36.4 C)-98.3 F (36.8 C)] 97.8 F (36.6 C) (12/05 0448) Pulse Rate:  [63-81] 63 (12/05 0448) Resp:  [16-18] 16 (12/05 0448) BP: (115-150)/(48-72) 115/48 mmHg (12/05 0448) SpO2:  [92 %-98 %] 96 % (12/05 0448) Last BM Date: 12/13/12 PO 480 Afebrile, VSS Labs remain normal. CT head normal US abdomen shows gallstone with no evidence of cholecytistis HIDA is also normal Intake/Output from previous day: 12/04 0701 - 12/05 0700 In: 480 [P.O.:480] Out: 500 [Urine:500] Intake/Output this shift:    General appearance: alert, cooperative and no distress Resp: clear to auscultation bilaterally GI: soft, non-tender; bowel sounds normal; no masses,  no organomegaly  Lab Results:   Recent Labs  12/11/12 1515 12/12/12 0818  WBC 7.1 6.3  HGB 12.1 11.6*  HCT 35.7* 33.6*  PLT 284 274    BMET  Recent Labs  12/12/12 0818 12/13/12 0458  NA 135 136  K 3.2* 3.7  CL 98 100  CO2 28 27  GLUCOSE 80 87  BUN 8 8  CREATININE 0.68 0.68  CALCIUM 9.2 9.1   PT/INR No results found for this basename: LABPROT, INR,  in the last 72 hours   Recent Labs Lab 12/11/12 1515 12/12/12 0818  AST 36 28  ALT 51* 39*  ALKPHOS 126* 114  BILITOT 0.7 0.7  PROT 7.3 6.8  ALBUMIN 3.8 3.3*     Lipase     Component Value Date/Time   LIPASE 16 12/12/2012 0818     Studies/Results: Ct Head Wo Contrast  12/11/2012   CLINICAL DATA:  Head injury, nausea.  EXAM: CT HEAD WITHOUT CONTRAST  TECHNIQUE: Contiguous axial images were obtained from the base of the skull through the vertex without intravenous contrast.  COMPARISON:  None.  FINDINGS: Ventricles, cisterns and other CSF spaces are within normal. There is no mass, mass effect, shift of midline structures or acute hemorrhage. There is no evidence of acute infarction. Remaining bones and soft tissues are within  normal.  IMPRESSION: No acute intracranial findings.   Electronically Signed   By: Elberta Fortis M.D.   On: 12/11/2012 16:32   Nm Hepatobiliary  12/12/2012   CLINICAL DATA:  Abdominal pain and nausea ; known cholelithiasis  EXAM: NUCLEAR MEDICINE HEPATOBILIARY IMAGING  Views: Anterior right upper quadrant  Radionuclide: Technetium 62m Choletec  Dose:  4.7 mCi  Route of administration: Intravenous  COMPARISON:  Abdominal ultrasound December 11, 2012  FINDINGS: Liver uptake of radiotracer is normal. There is prompt visualization of gallbladder and small bowel, indicating patency of the cystic and common bile ducts.  IMPRESSION: Study within normal limits.   Electronically Signed   By: Bretta Bang M.D.   On: 12/12/2012 12:20   US Abdomen Complete  12/11/2012   CLINICAL DATA:  77 year old female with abdominal pain.  EXAM: ULTRASOUND ABDOMEN COMPLETE  COMPARISON:  05/29/2012 renal ultrasound.  FINDINGS: Gallbladder:  Multiple mobile gallstones are noted, the largest measuring 7 mm. Upper limits normal wall thickness is noted measuring 3 mm. There is no evidence of pericholecystic fluid.  Common bile duct:  Diameter: 2.8 mm. There is no evidence of intrahepatic or extrahepatic biliary dilatation.  Liver:  No focal lesion identified. Within normal limits in parenchymal echogenicity.  IVC:  No abnormality visualized.  Pancreas:  Visualized portion unremarkable.  Spleen:  Size and appearance within normal limits.  Right  Kidney:  Length: 8.3 cm. Echogenicity within normal limits. No mass or hydronephrosis visualized.  Left Kidney:  Length: 9.2 cm. Echogenicity within normal limits. No mass or hydronephrosis visualized.  Abdominal aorta:  No aneurysm visualized.  Other findings:  There is no evidence of ascites.  IMPRESSION: Cholelithiasis with upper limits normal gallbladder wall thickness - equivocal for acute cholecystitis. No evidence of biliary dilatation.  No other significant abnormalities identified.    Electronically Signed   By: Laveda Abbe M.D.   On: 12/11/2012 18:27   Dg Chest Port 1 View  12/12/2012   CLINICAL DATA:  Cough, acute abdominal and back pain, former smoker  EXAM: PORTABLE CHEST - 1 VIEW  COMPARISON:  05/30/2012; 06/08/2007; 06/23/2005  FINDINGS: Grossly unchanged borderline enlarged cardiac silhouette. Atherosclerotic plaque within the thoracic aorta. The lungs remain hyperexpanded with flattening of the bilateral hemidiaphragms and mild diffuse slightly nodular thickening of the pulmonary interstitium. No focal airspace opacities. No pleural effusion or pneumothorax. No definite evidence of edema. There is persistent mild elevation of the right hemidiaphragm. Unchanged bones.  IMPRESSION: Hyperexpanded lungs and bronchitic change without acute cardiopulmonary disease.   Electronically Signed   By: Simonne Come M.D.   On: 12/12/2012 16:41    Medications: . calcium carbonate  1 tablet Oral Q lunch  . docusate sodium  100 mg Oral BID  . enoxaparin (LOVENOX) injection  40 mg Subcutaneous Q24H  . feeding supplement (RESOURCE BREEZE)  1 Container Oral BID BM  . fluconazole  100 mg Oral Daily  . levothyroxine  112 mcg Oral QAC breakfast  . pantoprazole (PROTONIX) IV  40 mg Intravenous Q12H  . polyethylene glycol  17 g Oral Daily  . simvastatin  40 mg Oral q1800    Assessment/Plan   Abdominal pain and cholelithiasis  Normal LFT's, lipase, CBC and HIDA scan. Chronic abdominal discomfort  Hx of hiatal hernia  Esophageal stricture  Hypothyroid  GERD  Arthritis/ Right knee arthroscopy with meniscal debridement 02/2011  COPD/20 year hx of tobacco use  Hx of AF (DR. Hochrein saw her in 2009)  Hypertension  Fall last week (CT head was negative)   PLan:  We are going to ask GI to see and evaluate, but I don't see an urgent need for surgery at this time.  Dr Ewing Schlein is her GI doctor and I have called. Discussed with DR. Buccini and I will get a CT scan also to rule out some occult  disease.

## 2012-12-13 NOTE — Progress Notes (Signed)
[facility] PROGRESS NOTE  Amy [name] WUJ:[id number] DOB: Dec 08, 1935 DOA: 12/11/2012 PCP: Herb Grays, MD  Assessment/Plan  Epigastric to LUQ pain:  Differential diagnosis includes angina, symptomatic paroxysmal atrial fibrillation, peptic ulcer disease, gastritis, IBS, symptomatic gallstones. No hematuria to suggest kidney stones.  GI cocktail may have helped her pain yesterday, and she is feeling much better today. - Troponin: Negative.  - Continue Telemetry for at least 24 hours looking for an association between episode of pain and paroxysmal atrial fibrillation.  Telemetry demonstrated normal sinus arrhythmia without significant arrhythmias - Continue GI cocktail when necessary - Continue Protonix  - Chest x-ray: No evidence of mass, pneumonia, pneumothorax  -  Echocardiogram pending -  Recommend outpatient stress test,  Previously seen by [facility] cardiology  Constipation resolved and now having diarrhea -  DC stool softeners  Paroxysmal atrial fibrillation, and Chads2vasc score of 3  - Currently sinus rhythm, however would qualify for long-term anticoagulation  -Given her recent history of fall which required head CT, however, we will defer discussion of ongoing long-term anticoagulation to her primary cardiologist -  Close outpatient cardiology followup  COPD, mild, chronic and stable. Continue to [facility]   Hypothyroidism, with very elevated TSH.  Patient states that she has been taking her Synthroid regularly, her dose was last decreased approximately 3 months ago and has not been rechecked since then. She occasionally takes her medication with her cholesterol medication or maybe one or 2 other medications. She denies taking it regularly with any calcium.  -  Increase levothyroxine 150 mcg  Normocytic anemia, likely due to anemia of chronic disease. Her vitamin B12 level was 397 which could be higher so I will start her on some daily vitamin B12. Folate is pending  -  Occult stool pending  Hypokalemia, likely secondary to restricted diet  - Oral repletion of her and her are her in KCl  - Liberalize diet as tolerated  Diet:  Still on liquid diet, defer to General surgery Access:  PIV  IVF:  Off Proph:  Lovenox  Code Status: Full Family Communication: Patient and her sister-in-law Disposition Plan: Per surgery   HPI/Subjective:  The patient states that she is feeling much better today. Her left upper quadrant pain has completely resolved, it got better after her GI cocktail. She is asking when she can go home.  Objective: Filed Vitals:   12/12/12 1742 12/12/12 2222 12/13/12 0448 12/13/12 1343  BP: 137/61 138/63 115/48 152/60  Pulse: 75 64 63 79  Temp: 97.6 F (36.4 C) 97.8 F (36.6 C) 97.8 F (36.6 C) 97.8 F (36.6 C)  TempSrc: Oral Oral Oral Oral  Resp: 16 16 16 18   Height:      Weight:      SpO2: 98% 95% 96% 98%    Intake/Output Summary (Last 24 hours) at 12/13/12 1423 Last data filed at 12/13/12 1345  Gross per 24 hour  Intake   1320 ml  Output    500 ml  Net    820 ml   Filed Weights   12/12/12 0550  Weight: 56.6 kg (124 lb 12.5 oz)    Exam:   General:  Caucasian female, No acute distress  HEENT:  NCAT, MMM  Cardiovascular:  RRR, nl S1, S2 2/6 systolic ejection murmur at the left sternal border, 2+ pulses, warm extremities  Respiratory:  CTAB, no increased WOB  Abdomen:   NABS, soft, NT/ND  MSK:   Normal tone and bulk, no Lamara  Neuro:  Grossly intact  Data Reviewed: Basic Metabolic Panel:  Recent Labs Lab 12/11/12 1515 12/12/12 0818 12/13/12 0458  NA 135 135 136  K 3.5 3.2* 3.7  CL 97 98 100  CO2 28 28 27   GLUCOSE 93 80 87  BUN 7 8 8   CREATININE 0.63 0.68 0.68  CALCIUM 9.5 9.2 9.1   Liver Function Tests:  Recent Labs Lab 12/11/12 1515 12/12/12 0818  AST 36 28  ALT 51* 39*  ALKPHOS 126* 114  BILITOT 0.7 0.7  PROT 7.3 6.8  ALBUMIN 3.8 3.3*    Recent Labs Lab 12/11/12 1515  12/12/12 0818  LIPASE 16 16   No results found for this basename: AMMONIA,  in the last 168 hours CBC:  Recent Labs Lab 12/11/12 1515 12/12/12 0818  WBC 7.1 6.3  NEUTROABS 3.8  --   HGB 12.1 11.6*  HCT 35.7* 33.6*  MCV 93.0 93.3  PLT 284 274   Cardiac Enzymes:  Recent Labs Lab 12/12/12 1549  TROPONINI <0.30   BNP (last 3 results) No results found for this basename: PROBNP,  in the last 8760 hours CBG: No results found for this basename: GLUCAP,  in the last 168 hours  No results found for this or any previous visit (from the past 240 hour(s)).   Studies: Ct Head Wo Contrast  12/11/2012   CLINICAL DATA:  Head injury, nausea.  EXAM: CT HEAD WITHOUT CONTRAST  TECHNIQUE: Contiguous axial images were obtained from the base of the skull through the vertex without intravenous contrast.  COMPARISON:  None.  FINDINGS: Ventricles, cisterns and other CSF spaces are within normal. There is no mass, mass effect, shift of midline structures or acute hemorrhage. There is no evidence of acute infarction. Remaining bones and soft tissues are within normal.  IMPRESSION: No acute intracranial findings.   Electronically Signed   By: Elberta Fortis M.D.   On: 12/11/2012 16:32   Nm Hepatobiliary  12/12/2012   CLINICAL DATA:  Abdominal pain and nausea ; known cholelithiasis  EXAM: NUCLEAR MEDICINE HEPATOBILIARY IMAGING  Views: Anterior right upper quadrant  Radionuclide: Technetium 51m Choletec  Dose:  4.7 mCi  Route of administration: Intravenous  COMPARISON:  Abdominal ultrasound December 11, 2012  FINDINGS: Liver uptake of radiotracer is normal. There is prompt visualization of gallbladder and small bowel, indicating patency of the cystic and common bile ducts.  IMPRESSION: Study within normal limits.   Electronically Signed   By: Bretta Bang M.D.   On: 12/12/2012 12:20   US Abdomen Complete  12/11/2012   CLINICAL DATA:  77 year old female with abdominal pain.  EXAM: ULTRASOUND ABDOMEN  COMPLETE  COMPARISON:  05/29/2012 renal ultrasound.  FINDINGS: Gallbladder:  Multiple mobile gallstones are noted, the largest measuring 7 mm. Upper limits normal wall thickness is noted measuring 3 mm. There is no evidence of pericholecystic fluid.  Common bile duct:  Diameter: 2.8 mm. There is no evidence of intrahepatic or extrahepatic biliary dilatation.  Liver:  No focal lesion identified. Within normal limits in parenchymal echogenicity.  IVC:  No abnormality visualized.  Pancreas:  Visualized portion unremarkable.  Spleen:  Size and appearance within normal limits.  Right Kidney:  Length: 8.3 cm. Echogenicity within normal limits. No mass or hydronephrosis visualized.  Left Kidney:  Length: 9.2 cm. Echogenicity within normal limits. No mass or hydronephrosis visualized.  Abdominal aorta:  No aneurysm visualized.  Other findings:  There is no evidence of ascites.  IMPRESSION: Cholelithiasis with upper limits normal gallbladder wall thickness -  equivocal for acute cholecystitis. No evidence of biliary dilatation.  No other significant abnormalities identified.   Electronically Signed   By: Laveda Abbe M.D.   On: 12/11/2012 18:27   Dg Chest Port 1 View  12/12/2012   CLINICAL DATA:  Cough, acute abdominal and back pain, former smoker  EXAM: PORTABLE CHEST - 1 VIEW  COMPARISON:  05/30/2012; 06/08/2007; 06/23/2005  FINDINGS: Grossly unchanged borderline enlarged cardiac silhouette. Atherosclerotic plaque within the thoracic aorta. The lungs remain hyperexpanded with flattening of the bilateral hemidiaphragms and mild diffuse slightly nodular thickening of the pulmonary interstitium. No focal airspace opacities. No pleural effusion or pneumothorax. No definite evidence of edema. There is persistent mild elevation of the right hemidiaphragm. Unchanged bones.  IMPRESSION: Hyperexpanded lungs and bronchitic change without acute cardiopulmonary disease.   Electronically Signed   By: Simonne Come M.D.   On: 12/12/2012  16:41    Scheduled Meds: . calcium carbonate  1 tablet Oral Q lunch  . docusate sodium  100 mg Oral BID  . enoxaparin (LOVENOX) injection  40 mg Subcutaneous Q24H  . feeding supplement (RESOURCE BREEZE)  1 Container Oral BID BM  . fluconazole  100 mg Oral Daily  . levothyroxine  112 mcg Oral QAC breakfast  . pantoprazole (PROTONIX) IV  40 mg Intravenous Q12H  . polyethylene glycol  17 g Oral Daily  . simvastatin  40 mg Oral q1800   Continuous Infusions:   Active Problems:   Biliary colic   GERD (gastroesophageal reflux disease)   Hypothyroidism   Paroxysmal atrial fibrillation   COPD (chronic obstructive pulmonary disease)   Normocytic anemia    Time spent: 30 min    [place], [facility]  [facility] Pager [phone]. If 7PM-7AM, please contact night-coverage at [email or web address], password [facility] 12/13/2012, 2:23 PM  LOS: 2 days

## 2012-12-13 NOTE — Progress Notes (Signed)
  Echocardiogram 2D Echocardiogram has been performed.  Cathie Beams 12/13/2012, 2:39 PM

## 2012-12-13 NOTE — Consult Note (Signed)
Referring Provider: Dr. Wenda Low Primary Care Physician:  Herb Grays, MD Primary Gastroenterologist:  Dr. Ewing Schlein  Reason for Consultation:  Abdominal pain  HPI: Amy [name] is a 77 y.o. female admitted to the hospital several days ago following an episode of nonspecific mid abdominal pain, across the midportion of her abdomen, which began around 7 PM on Friday night, a week ago, prior to eating dinner. She was at a show at that time. She indicates the pain was "9 on a scale of 10" but she did not leave the show, and was able to drive her self home. The pain subsided that night. She had not had similar pain in the past. There is a family history of gallbladder disease (and her mother), and an ultrasound here did show gallstones, but HIDA scan has been negative, as has white count, liver chemistries, and lipase levels.   The patient's history is somewhat inconsistent. For example, when she was admitted, it was described as upper abdominal pain radiating through to her back with similar symptoms for 6 months, whereas she tells me that she has not had antecedent GI tract symptoms.  At this time, the patient denies pain and feels like she is able to eat, although she has been n.p.o. as part of her GI workup. In the hospital, she has had medicine evaluation and has ruled out for an MI.  Previous evaluation by Dr. Ewing Schlein included upper endoscopy with esophageal dilatation in August of this year, and colonoscopy a year ago that showed sigmoid diverticulosis and hemorrhoids.  The patient has a history of GERD, but was not on PPI therapy prior to admission. She indicates that, since her husband died 2 years ago, she has not really been taking care of herself. She eats most of her meals out, does not have a regular schedule for meals, has not been taking her medications reliably, and so forth.  Past Medical History  Diagnosis Date  . GERD (gastroesophageal reflux disease)   . Hypothyroidism   .  Arthritis   . Paroxysmal atrial fibrillation   . Renal lesion     1cm left kidney  . Osteoarthritis   . Osteoporosis   . COPD (chronic obstructive pulmonary disease)   . Hiatal hernia   . Esophageal stricture     s/p repeated dilations, Dr. Ewing Schlein  . Pulmonary nodules   . Anxiety   . Hyperlipidemia     Past Surgical History  Procedure Laterality Date  . Cataract extraction w/ intraocular lens  implant, bilateral  '09  . Abdominal hysterectomy    . Breast enhancement surgery  2006  . Esophageal dilation    . Knee arthroscopy  02/22/2011    Procedure: ARTHROSCOPY KNEE;  Surgeon: Loanne Drilling, MD;  Location: [facility];  Service: Orthopedics;  Laterality: Right;  WITH DEBRIDEMENT     Prior to Admission medications   Medication Sig Start Date End Date Taking? Authorizing Provider  ALPRAZolam Prudy Feeler) 0.25 MG tablet Take 1 tablet by mouth as needed for anxiety or sleep.  03/26/12  Yes Historical Provider, MD  budesonide-formoterol (SYMBICORT) 160-4.5 MCG/ACT inhaler Inhale 2 puffs into the lungs 2 (two) times daily as needed (shortness of breath). 05/30/12  Yes Nyoka Cowden, MD  calcium carbonate (OS-CAL - DOSED IN MG OF ELEMENTAL CALCIUM) 1250 MG tablet Take 1 tablet by mouth daily.   Yes Historical Provider, MD  fluconazole (DIFLUCAN) 100 MG tablet Take 100 mg by mouth daily.   Yes  Historical Provider, MD  levothyroxine (SYNTHROID, LEVOTHROID) 112 MCG tablet Take 112 mcg by mouth daily.   Yes Historical Provider, MD  pravastatin (PRAVACHOL) 80 MG tablet Take 1 tablet by mouth daily. 04/21/12  Yes Historical Provider, MD  traMADol (ULTRAM) 50 MG tablet Take 50 mg by mouth every 6 (six) hours as needed for moderate pain or severe pain.    Yes Historical Provider, MD  HYDROcodone-acetaminophen (NORCO) 5-325 MG per tablet Take 1 tablet by mouth every 4 (four) hours as needed. 12/11/12   Enid Skeens, MD  ondansetron (ZOFRAN ODT) 4 MG disintegrating tablet 4mg  ODT q4  hours prn nausea/vomit 12/11/12   Enid Skeens, MD    Current Facility-Administered Medications  Medication Dose Route Frequency Provider Last Rate Last Dose  . acetaminophen (TYLENOL) tablet 650 mg  650 mg Oral Q4H PRN Letha Cape, PA-C      . ALPRAZolam Prudy Feeler) tablet 0.25 mg  0.25 mg Oral PRN Romie Levee, MD      . budesonide-formoterol [employer]) 160-4.5 MCG/ACT inhaler 2 puff  2 puff Inhalation BID PRN Romie Levee, MD      . calcium carbonate (OS-CAL - dosed in mg of elemental calcium) tablet 500 mg of elemental calcium  1 tablet Oral Q lunch Romie Levee, MD   500 mg of elemental calcium at 12/13/12 1434  . enoxaparin (LOVENOX) injection 40 mg  40 mg Subcutaneous Q24H Romie Levee, MD   40 mg at 12/12/12 2126  . feeding supplement (RESOURCE BREEZE) (RESOURCE BREEZE) liquid 1 Container  1 Container Oral BID BM Lorraine Lax, RD   1 Container at 12/13/12 1434  . fluconazole (DIFLUCAN) tablet 100 mg  100 mg Oral Daily Romie Levee, MD   100 mg at 12/13/12 1017  . HYDROcodone-acetaminophen (NORCO/VICODIN) 5-325 MG per tablet 1-2 tablet  1-2 tablet Oral Q4H PRN Romie Levee, MD   1 tablet at 12/12/12 2135  . HYDROmorphone (DILAUDID) injection 0.5 mg  0.5 mg Intravenous Q4H PRN Romie Levee, MD      . levothyroxine (SYNTHROID, LEVOTHROID) tablet 150 mcg  150 mcg Oral QAC breakfast Renae Fickle, MD      . ondansetron [facility]) injection 4 mg  4 mg Intravenous Q6H PRN Romie Levee, MD   4 mg at 12/12/12 1008  . pantoprazole (PROTONIX) injection 40 mg  40 mg Intravenous Q12H Renae Fickle, MD   40 mg at 12/13/12 1017  . polyethylene glycol (MIRALAX / GLYCOLAX) packet 17 g  17 g Oral Daily PRN Renae Fickle, MD      . simvastatin (ZOCOR) tablet 40 mg  40 mg Oral q1800 Romie Levee, MD   40 mg at 12/12/12 1755    Allergies as of 12/11/2012 - Review Complete 12/11/2012  Allergen Reaction Noted  . Codeine  02/16/2011    Family History  Problem Relation Age of Onset  .  Heart disease Father   . Rheum arthritis Father   . Heart failure Mother   . Diabetes Brother     History   Social History  . Marital Status: Widowed    Spouse Name: N/A    Number of Children: N/A  . Years of Education: N/A   Occupational History  . Not on file.   Social History Main Topics  . Smoking status: Former Smoker -- 1.00 packs/day for 30 years    Types: Cigarettes    Quit date: 01/09/1997  . Smokeless tobacco: Never Used  . Alcohol Use: No  . Drug  Use: No  . Sexual Activity: Not on file   Other Topics Concern  . Not on file   Social History Narrative  . No narrative on file    Review of Systems: This would have to be considered somewhat unreliable based on the above in consistency of her history. For what it is worth, she denies nausea, fevers, chills, or alteration of bowel habit in association with her recent episode of pain a week ago. In general, she is not bothered by GI tract symptoms apart from occasional constipation, by her current report.   Physical Exam: Vital signs in last 24 hours: Temp:  [97.6 F (36.4 C)-97.8 F (36.6 C)] 97.8 F (36.6 C) (12/05 1343) Pulse Rate:  [63-79] 79 (12/05 1343) Resp:  [16-18] 18 (12/05 1343) BP: (115-152)/(48-63) 152/60 mmHg (12/05 1343) SpO2:  [95 %-98 %] 98 % (12/05 1343) Last BM Date: 12/13/12 General:   Alert,  Well-developed, well-nourished, pleasant and cooperative in NAD. Ecchymosis on right for head, corresponding to where she fell 4 days ago Head:  Normocephalic and atraumatic. Eyes:  Sclera clear, no icterus.   Conjunctiva pink. Mouth:   No ulcerations or lesions.  Oropharynx pink & moist. Neck:   No masses or thyromegaly. Lungs:  Clear throughout to auscultation.   No wheezes, crackles, or rhonchi. No evident respiratory distress. Heart:   Regular rate and rhythm; no murmurs, clicks, rubs,  or gallops. Abdomen:  Soft, nontender, and nondistended. No masses, hepatosplenomegaly or ventral hernias noted.  Normal bowel sounds, without bruits, guarding, or rebound.   Msk:   Symmetrical without gross deformities. Extremities:   Without clubbing, cyanosis, or edema. Neurologic:  Alert and coherent;  grossly normal neurologically. Skin:  Intact without significant lesions or rashes except for right for head resolving ecchymosis and minor abrasion.. Cervical Nodes:  No significant cervical adenopathy. Psych:   Alert and cooperative. Normal mood and affect.  Intake/Output from previous day: 12/04 0701 - 12/05 0700 In: 480 [P.O.:480] Out: 500 [Urine:500] Intake/Output this shift: Total I/O In: 840 [P.O.:840] Out: -   Lab Results:  Recent Labs  12/11/12 1515 12/12/12 0818  WBC 7.1 6.3  HGB 12.1 11.6*  HCT 35.7* 33.6*  PLT 284 274   BMET  Recent Labs  12/11/12 1515 12/12/12 0818 12/13/12 0458  NA 135 135 136  K 3.5 3.2* 3.7  CL 97 98 100  CO2 28 28 27   GLUCOSE 93 80 87  BUN 7 8 8   CREATININE 0.63 0.68 0.68  CALCIUM 9.5 9.2 9.1   LFT  Recent Labs  12/12/12 0818  PROT 6.8  ALBUMIN 3.3*  AST 28  ALT 39*  ALKPHOS 114  BILITOT 0.7   PT/INR No results found for this basename: LABPROT, INR,  in the last 72 hours   Studies/Results: Ct Head Wo Contrast  12/11/2012   CLINICAL DATA:  Head injury, nausea.  EXAM: CT HEAD WITHOUT CONTRAST  TECHNIQUE: Contiguous axial images were obtained from the base of the skull through the vertex without intravenous contrast.  COMPARISON:  None.  FINDINGS: Ventricles, cisterns and other CSF spaces are within normal. There is no mass, mass effect, shift of midline structures or acute hemorrhage. There is no evidence of acute infarction. Remaining bones and soft tissues are within normal.  IMPRESSION: No acute intracranial findings.   Electronically Signed   By: Elberta Fortis M.D.   On: 12/11/2012 16:32   Nm Hepatobiliary  12/12/2012   CLINICAL DATA:  Abdominal pain and nausea ;  known cholelithiasis  EXAM: NUCLEAR MEDICINE HEPATOBILIARY  IMAGING  Views: Anterior right upper quadrant  Radionuclide: Technetium 57m Choletec  Dose:  4.7 mCi  Route of administration: Intravenous  COMPARISON:  Abdominal ultrasound December 11, 2012  FINDINGS: Liver uptake of radiotracer is normal. There is prompt visualization of gallbladder and small bowel, indicating patency of the cystic and common bile ducts.  IMPRESSION: Study within normal limits.   Electronically Signed   By: Bretta Bang M.D.   On: 12/12/2012 12:20   US Abdomen Complete  12/11/2012   CLINICAL DATA:  77 year old female with abdominal pain.  EXAM: ULTRASOUND ABDOMEN COMPLETE  COMPARISON:  05/29/2012 renal ultrasound.  FINDINGS: Gallbladder:  Multiple mobile gallstones are noted, the largest measuring 7 mm. Upper limits normal wall thickness is noted measuring 3 mm. There is no evidence of pericholecystic fluid.  Common bile duct:  Diameter: 2.8 mm. There is no evidence of intrahepatic or extrahepatic biliary dilatation.  Liver:  No focal lesion identified. Within normal limits in parenchymal echogenicity.  IVC:  No abnormality visualized.  Pancreas:  Visualized portion unremarkable.  Spleen:  Size and appearance within normal limits.  Right Kidney:  Length: 8.3 cm. Echogenicity within normal limits. No mass or hydronephrosis visualized.  Left Kidney:  Length: 9.2 cm. Echogenicity within normal limits. No mass or hydronephrosis visualized.  Abdominal aorta:  No aneurysm visualized.  Other findings:  There is no evidence of ascites.  IMPRESSION: Cholelithiasis with upper limits normal gallbladder wall thickness - equivocal for acute cholecystitis. No evidence of biliary dilatation.  No other significant abnormalities identified.   Electronically Signed   By: Laveda Abbe M.D.   On: 12/11/2012 18:27   Dg Chest Port 1 View  12/12/2012   CLINICAL DATA:  Cough, acute abdominal and back pain, former smoker  EXAM: PORTABLE CHEST - 1 VIEW  COMPARISON:  05/30/2012; 06/08/2007; 06/23/2005  FINDINGS:  Grossly unchanged borderline enlarged cardiac silhouette. Atherosclerotic plaque within the thoracic aorta. The lungs remain hyperexpanded with flattening of the bilateral hemidiaphragms and mild diffuse slightly nodular thickening of the pulmonary interstitium. No focal airspace opacities. No pleural effusion or pneumothorax. No definite evidence of edema. There is persistent mild elevation of the right hemidiaphragm. Unchanged bones.  IMPRESSION: Hyperexpanded lungs and bronchitic change without acute cardiopulmonary disease.   Electronically Signed   By: Simonne Come M.D.   On: 12/12/2012 16:41    Impression: 1. Nonspecific episode of upper abdominal pain a week ago, with somewhat unclear history prior to then and since then. Currently, asymptomatic. 2. Cholelithiasis 3. Family history of gallbladder disease   Plan: CT of abdomen and pelvis to screen for gross pathology. If negative, I would probably advance the patient's diet and, if that is tolerated, let her go home to continue outpatient followup with Dr. Ewing Schlein.   One option is to simply observe the patient over time, and consider doing a cholecystectomy if she has recurrent symptoms.   Alternatively, consideration might be given to a trial of ursodeoxycholic acid, which will sometimes diminish biliary tract symptoms even if the gallstones do not dissolve on that medication.   LOS: 2 days   Lafawn Lenoir V  12/13/2012, 3:08 PM

## 2012-12-14 DIAGNOSIS — I4891 Unspecified atrial fibrillation: Secondary | ICD-10-CM

## 2012-12-14 LAB — URINALYSIS, ROUTINE W REFLEX MICROSCOPIC
Bilirubin Urine: NEGATIVE
Glucose, UA: NEGATIVE mg/dL
Hgb urine dipstick: NEGATIVE
Ketones, ur: NEGATIVE mg/dL
Protein, ur: NEGATIVE mg/dL
Urobilinogen, UA: 0.2 mg/dL (ref 0.0–1.0)

## 2012-12-14 MED ORDER — ONDANSETRON 4 MG PO TBDP: ORAL_TABLET | ORAL | Status: DC

## 2012-12-14 MED ORDER — PANTOPRAZOLE SODIUM 40 MG PO TBEC: 40.0000 mg | DELAYED_RELEASE_TABLET | Freq: Two times a day (BID) | ORAL | Status: DC

## 2012-12-14 MED ORDER — FLUCONAZOLE 100 MG PO TABS: 100.0000 mg | ORAL_TABLET | Freq: Every day | ORAL | Status: DC

## 2012-12-14 NOTE — Progress Notes (Signed)
Utilization Review completed.  

## 2012-12-14 NOTE — Progress Notes (Signed)
[facility] Progress Note  Subjective: Pt currently denies any nausea vomiting or pain  Objective: Vital signs in last 24 hours: Temp:  [97.8 F (36.6 C)-98.1 F (36.7 C)] 98.1 F (36.7 C) (12/06 0530) Pulse Rate:  [72-79] 79 (12/06 0530) Resp:  [16-18] 16 (12/06 0530) BP: (131-152)/(60-70) 131/70 mmHg (12/06 0530) SpO2:  [96 %-98 %] 96 % (12/06 0530) Weight change:    [id number]  Lab Results: Results for orders placed during the hospital encounter of 12/11/12 (from the past 24 hour(s))  URINALYSIS, ROUTINE W REFLEX MICROSCOPIC     Status: Abnormal   Collection Time    12/14/12  5:41 AM      Result Value Range   Color, Urine YELLOW  YELLOW   APPearance CLEAR  CLEAR   Specific Gravity, Urine 1.039 (*) 1.005 - 1.030   pH 7.0  5.0 - 8.0   Glucose, UA NEGATIVE  NEGATIVE mg/dL   Hgb urine dipstick NEGATIVE  NEGATIVE   Bilirubin Urine NEGATIVE  NEGATIVE   Ketones, ur NEGATIVE  NEGATIVE mg/dL   Protein, ur NEGATIVE  NEGATIVE mg/dL   Urobilinogen, UA 0.2  0.0 - 1.0 mg/dL   Nitrite NEGATIVE  NEGATIVE   Leukocytes, UA NEGATIVE  NEGATIVE    Studies/Results: Nm Hepatobiliary  12/12/2012   CLINICAL DATA:  Abdominal pain and nausea ; known cholelithiasis  EXAM: NUCLEAR MEDICINE HEPATOBILIARY IMAGING  Views: Anterior right upper quadrant  Radionuclide: Technetium 62m Choletec  Dose:  4.7 mCi  Route of administration: Intravenous  COMPARISON:  Abdominal ultrasound December 11, 2012  FINDINGS: Liver uptake of radiotracer is normal. There is prompt visualization of gallbladder and small bowel, indicating patency of the cystic and common bile ducts.  IMPRESSION: Study within normal limits.   Electronically Signed   By: Bretta Bang M.D.   On: 12/12/2012 12:20   Ct Abdomen Pelvis W Contrast  12/13/2012   CLINICAL DATA:  Diffuse abdominal pain.  Dysuria.  Nausea.  EXAM: CT ABDOMEN AND PELVIS WITH CONTRAST  TECHNIQUE: Multidetector CT imaging of the abdomen and pelvis was  performed using the standard protocol following bolus administration of intravenous contrast.  CONTRAST:  80mL OMNIPAQUE IOHEXOL 300 MG/ML  SOLN  COMPARISON:  None.  FINDINGS: Normal appearing liver, spleen, pancreas, gallbladder, adrenal glands and urinary bladder. Small bilateral renal cysts. Surgically absent uterus. The ovaries are not visualized. No enlarged lymph nodes. Multiple sigmoid colon diverticula without evidence of diverticulitis. No evidence of appendicitis. Unremarkable stomach and small bowel. Clear lung bases. Unremarkable bones.  IMPRESSION: 1. No acute abnormality. 2. Sigmoid diverticulosis.   Electronically Signed   By: Gordan Payment M.D.   On: 12/13/2012 19:11   Dg Chest Port 1 View  12/12/2012   CLINICAL DATA:  Cough, acute abdominal and back pain, former smoker  EXAM: PORTABLE CHEST - 1 VIEW  COMPARISON:  05/30/2012; 06/08/2007; 06/23/2005  FINDINGS: Grossly unchanged borderline enlarged cardiac silhouette. Atherosclerotic plaque within the thoracic aorta. The lungs remain hyperexpanded with flattening of the bilateral hemidiaphragms and mild diffuse slightly nodular thickening of the pulmonary interstitium. No focal airspace opacities. No pleural effusion or pneumothorax. No definite evidence of edema. There is persistent mild elevation of the right hemidiaphragm. Unchanged bones.  IMPRESSION: Hyperexpanded lungs and bronchitic change without acute cardiopulmonary disease.   Electronically Signed   By: Simonne Come M.D.   On: 12/12/2012 16:41      Assessment: Gallstones, asymptomatic at present, no additional GI pathology on recent EGD or abdominal CT scan.  Plan: Doubt further inpatient workup needed. She's follows Dr. Ewing Schlein as an outpatient for GI standpoint and it goes home today would suggest followup with him. Will advance diet.    Wilber Fini C 12/14/2012, 7:51 AM

## 2012-12-14 NOTE — Discharge Summary (Signed)
Physician Discharge Summary  Patient ID: Amy [name] MRN: [id number] DOB/AGE: 10-Nov-1935 77 y.o.  Admit date: 12/11/2012 Discharge date: 12/14/2012  Admission Diagnoses:  cholecystitis  Discharge Diagnoses:  GERD  Active Problems:   Biliary colic   GERD (gastroesophageal reflux disease)   Hypothyroidism   Paroxysmal atrial fibrillation   COPD (chronic obstructive pulmonary disease)   Normocytic anemia   Surgery:  none  Discharged Condition: improved  Hospital Course:   Was admitted because of vague abdominal pain and gallstones on CT.    Consults: GI, Hospitalist  Significant Diagnostic Studies: Had a normal HIDA (no cholecystitis);  CT essentially normal.      Discharge Exam: Blood pressure 131/70, pulse 79, temperature 98.1 F (36.7 C), temperature source Oral, resp. rate 17, height 5\' 5"  (1.651 m), weight 124 lb 12.5 oz (56.6 kg), SpO2 96.00%. No abdominal pain.    Disposition: 01-Home or Self Care  Discharge Orders   Future Orders Complete By Expires   Diet - low sodium heart healthy  As directed    Increase activity slowly  As directed        Medication List         ALPRAZolam 0.25 MG tablet  Commonly known as:  XANAX  Take 1 tablet by mouth as needed for anxiety or sleep.     budesonide-formoterol 160-4.5 MCG/ACT inhaler  Commonly known as:  SYMBICORT  Inhale 2 puffs into the lungs 2 (two) times daily as needed (shortness of breath).     calcium carbonate 1250 MG tablet  Commonly known as:  OS-CAL - dosed in mg of elemental calcium  Take 1 tablet by mouth daily.     fluconazole 100 MG tablet  Commonly known as:  DIFLUCAN  Take 100 mg by mouth daily.     HYDROcodone-acetaminophen 5-325 MG per tablet  Commonly known as:  NORCO  Take 1 tablet by mouth every 4 (four) hours as needed.     levothyroxine 112 MCG tablet  Commonly known as:  SYNTHROID, LEVOTHROID  Take 112 mcg by mouth daily.     ondansetron 4 MG disintegrating tablet  Commonly  known as:  ZOFRAN ODT  4mg  ODT q4 hours prn nausea/vomit     pravastatin 80 MG tablet  Commonly known as:  PRAVACHOL  Take 1 tablet by mouth daily.     traMADol 50 MG tablet  Commonly known as:  ULTRAM  Take 50 mg by mouth every 6 (six) hours as needed for moderate pain or severe pain.           Follow-up Information   Follow up with Herb Grays, MD. Call in 2 days.   Specialty:  Family Medicine   Contact information:   [address] [place] [address] [phone]       Follow up with [facility] Main Office [facility]).   Specialty:  Cardiology   Contact information:   [address] [place] [address] [phone]      Signed: Valarie Merino 12/14/2012, 10:10 AM

## 2012-12-14 NOTE — Progress Notes (Addendum)
[facility] PROGRESS NOTE  Amy [name] GNF:[id number] DOB: August 19, 1935 DOA: 12/11/2012 PCP: Herb Grays, MD  Assessment/Plan  Epigastric to LUQ pain:  Differential diagnosis includes angina, symptomatic paroxysmal atrial fibrillation, peptic ulcer disease, gastritis, IBS, symptomatic gallstones. No hematuria to suggest kidney stones.   - Troponin: Negative.  - Telemetry:  NSR, no atrial fibrillation - Continue Protonix BID - Chest x-ray: No evidence of mass, pneumonia, pneumothorax  -  Echocardiogram wnl -  Recommend outpatient stress test,  Previously seen by [facility] cardiology -  Dr. Ewing Schlein f/u in a few weeks -  If cardiac work up is negative and PPI not helping, consider ursodiol and/or possible cholecystectomy, but GI can determine that at a later date.  Constipation resolved and now having diarrhea -  DC stool softeners  Paroxysmal atrial fibrillation, and Chads2vasc score of 3  -  Close outpatient cardiology followup to discuss anticoagulation  COPD, mild, chronic and stable. Continue to [facility]   Hypothyroidism, with elevated TSH.   -  Increased levothyroxine to 150 mcg daily -  Will need TSH by PCP in 4 weeks  Normocytic anemia, likely due to anemia of chronic disease.  - vitamin B12 level was 397, started daily vitamin B12. -  Folate wnl - Occult stool not drawn  Hypokalemia, likely secondary to restricted diet, resolved with oral KCl  Thrush:  Needs PCP to do HIV testing -  Wash mouth out/brush teeth after using inhalers -  Continue fluconazole x 10 days, today is day 3  Diet:  Advance to healthy heart Access:  PIV  IVF:  Off Proph:  Lovenox  Code Status: Full Family Communication: Patient alone Disposition Plan:  Patient stable for discharge today.   HPI/Subjective:  The patient states that she is feeling well.  No further pain.    Objective: Filed Vitals:   12/13/12 0448 12/13/12 1343 12/13/12 2150 12/14/12 0530  BP: 115/48 152/60 131/64 131/70   Pulse: 63 79 72 79  Temp: 97.8 F (36.6 C) 97.8 F (36.6 C) 97.9 F (36.6 C) 98.1 F (36.7 C)  TempSrc: Oral Oral Oral Oral  Resp: 16 18 16 16   Height:      Weight:      SpO2: 96% 98% 97% 96%    Intake/Output Summary (Last 24 hours) at 12/14/12 0851 Last data filed at 12/14/12 8469  Gross per 24 hour  Intake   1320 ml  Output    400 ml  Net    920 ml   Filed Weights   12/12/12 0550  Weight: 56.6 kg (124 lb 12.5 oz)    Exam:   General:  Caucasian female, No acute distress  HEENT:  NCAT, MMM  Cardiovascular:  RRR, nl S1, S2 2/6 systolic ejection murmur at the left sternal border, 2+ pulses, warm extremities  Respiratory:  CTAB, no increased WOB  Abdomen:   NABS, soft, NT/ND  MSK:   Normal tone and bulk, no Gresia  Neuro:  Grossly intact  Data Reviewed: Basic Metabolic Panel:  Recent Labs Lab 12/11/12 1515 12/12/12 0818 12/13/12 0458  NA 135 135 136  K 3.5 3.2* 3.7  CL 97 98 100  CO2 28 28 27   GLUCOSE 93 80 87  BUN 7 8 8   CREATININE 0.63 0.68 0.68  CALCIUM 9.5 9.2 9.1   Liver Function Tests:  Recent Labs Lab 12/11/12 1515 12/12/12 0818  AST 36 28  ALT 51* 39*  ALKPHOS 126* 114  BILITOT 0.7 0.7  PROT 7.3 6.8  ALBUMIN 3.8 3.3*    Recent Labs Lab 12/11/12 1515 12/12/12 0818  LIPASE 16 16   No results found for this basename: AMMONIA,  in the last 168 hours CBC:  Recent Labs Lab 12/11/12 1515 12/12/12 0818  WBC 7.1 6.3  NEUTROABS 3.8  --   HGB 12.1 11.6*  HCT 35.7* 33.6*  MCV 93.0 93.3  PLT 284 274   Cardiac Enzymes:  Recent Labs Lab 12/12/12 1549  TROPONINI <0.30   BNP (last 3 results) No results found for this basename: PROBNP,  in the last 8760 hours CBG: No results found for this basename: GLUCAP,  in the last 168 hours  No results found for this or any previous visit (from the past 240 hour(s)).   Studies: Nm Hepatobiliary  12/12/2012   CLINICAL DATA:  Abdominal pain and nausea ; known cholelithiasis  EXAM:  NUCLEAR MEDICINE HEPATOBILIARY IMAGING  Views: Anterior right upper quadrant  Radionuclide: Technetium 51m Choletec  Dose:  4.7 mCi  Route of administration: Intravenous  COMPARISON:  Abdominal ultrasound December 11, 2012  FINDINGS: Liver uptake of radiotracer is normal. There is prompt visualization of gallbladder and small bowel, indicating patency of the cystic and common bile ducts.  IMPRESSION: Study within normal limits.   Electronically Signed   By: Bretta Bang M.D.   On: 12/12/2012 12:20   Ct Abdomen Pelvis W Contrast  12/13/2012   CLINICAL DATA:  Diffuse abdominal pain.  Dysuria.  Nausea.  EXAM: CT ABDOMEN AND PELVIS WITH CONTRAST  TECHNIQUE: Multidetector CT imaging of the abdomen and pelvis was performed using the standard protocol following bolus administration of intravenous contrast.  CONTRAST:  80mL OMNIPAQUE IOHEXOL 300 MG/ML  SOLN  COMPARISON:  None.  FINDINGS: Normal appearing liver, spleen, pancreas, gallbladder, adrenal glands and urinary bladder. Small bilateral renal cysts. Surgically absent uterus. The ovaries are not visualized. No enlarged lymph nodes. Multiple sigmoid colon diverticula without evidence of diverticulitis. No evidence of appendicitis. Unremarkable stomach and small bowel. Clear lung bases. Unremarkable bones.  IMPRESSION: 1. No acute abnormality. 2. Sigmoid diverticulosis.   Electronically Signed   By: Gordan Payment M.D.   On: 12/13/2012 19:11   Dg Chest Port 1 View  12/12/2012   CLINICAL DATA:  Cough, acute abdominal and back pain, former smoker  EXAM: PORTABLE CHEST - 1 VIEW  COMPARISON:  05/30/2012; 06/08/2007; 06/23/2005  FINDINGS: Grossly unchanged borderline enlarged cardiac silhouette. Atherosclerotic plaque within the thoracic aorta. The lungs remain hyperexpanded with flattening of the bilateral hemidiaphragms and mild diffuse slightly nodular thickening of the pulmonary interstitium. No focal airspace opacities. No pleural effusion or pneumothorax. No  definite evidence of edema. There is persistent mild elevation of the right hemidiaphragm. Unchanged bones.  IMPRESSION: Hyperexpanded lungs and bronchitic change without acute cardiopulmonary disease.   Electronically Signed   By: Simonne Come M.D.   On: 12/12/2012 16:41    Scheduled Meds: . calcium carbonate  1 tablet Oral Q lunch  . enoxaparin (LOVENOX) injection  40 mg Subcutaneous Q24H  . feeding supplement (RESOURCE BREEZE)  1 Container Oral BID BM  . fluconazole  100 mg Oral Daily  . levothyroxine  150 mcg Oral QAC breakfast  . pantoprazole (PROTONIX) IV  40 mg Intravenous Q12H  . simvastatin  40 mg Oral q1800   Continuous Infusions:   Active Problems:   Biliary colic   GERD (gastroesophageal reflux disease)   Hypothyroidism   Paroxysmal atrial fibrillation   COPD (chronic obstructive pulmonary disease)  Normocytic anemia    Time spent: 30 min    Bufford Helms, [facility]  [facility] Pager [phone]. If 7PM-7AM, please contact night-coverage at [email or web address], password [facility] 12/14/2012, 8:51 AM  LOS: 3 days

## 2013-01-14 ENCOUNTER — Ambulatory Visit: Payer: Self-pay | Admitting: Cardiology

## 2013-03-03 ENCOUNTER — Encounter: Payer: Self-pay | Admitting: Cardiology

## 2013-03-07 ENCOUNTER — Ambulatory Visit: Payer: Medicare Other | Admitting: Cardiology

## 2013-04-02 ENCOUNTER — Ambulatory Visit: Payer: Medicare Other | Admitting: Cardiology

## 2013-04-25 ENCOUNTER — Encounter: Payer: Self-pay | Admitting: Cardiology

## 2013-04-25 ENCOUNTER — Ambulatory Visit (INDEPENDENT_AMBULATORY_CARE_PROVIDER_SITE_OTHER): Payer: Medicare Other | Admitting: Cardiology

## 2013-04-25 VITALS — BP 128/80 | HR 72 | Ht 65.0 in | Wt 117.1 lb

## 2013-04-25 DIAGNOSIS — E7800 Pure hypercholesterolemia, unspecified: Secondary | ICD-10-CM

## 2013-04-25 DIAGNOSIS — I10 Essential (primary) hypertension: Secondary | ICD-10-CM

## 2013-04-25 DIAGNOSIS — I059 Rheumatic mitral valve disease, unspecified: Secondary | ICD-10-CM

## 2013-04-25 DIAGNOSIS — I340 Nonrheumatic mitral (valve) insufficiency: Secondary | ICD-10-CM

## 2013-04-25 NOTE — Patient Instructions (Signed)
Your physician recommends that you continue on your current medications as directed. Please refer to the Current Medication list given to you today.  Your physician wants you to follow-up in: 1 year with Dr. Skains. You will receive a reminder letter in the mail two months in advance. If you don't receive a letter, please call our office to schedule the follow-up appointment.  

## 2013-04-25 NOTE — Progress Notes (Signed)
[facility]. [address] [place], [place]  [address] Phone: [phone] Fax:  [phone]  Date:  04/25/2013   ID:  SHIAH BERHOW, DOB April 29, 1935, MRN [id number]  PCP:  Florina Ou, MD   History of Present Illness: Amy [name] is a 78 y.o. female with recent admission secondary to beta abdominal pain and gallstones on CT scan with discharge diagnosis of GERD here for evaluation of paroxysmal atrial fibrillation. She had a normal hiatus scan, no evidence of cholecystitis, CT essentially normal. Her surgery was performed.  CHADS-VASC score is 3. NSR on tele. There was no evidence of atrial fibrillation throughout the hospitalization. I will take this off of her past medical history. She does not recall her having atrial fibrillation as well.  When taking the garbage out down her long driveway, she will have some mild to moderate shortness of breath. Unfortunately, she lost her husband in 2013 to pancreatic cancer after 3 months.  She denies any strokelike symptoms, palpitations, fevers, chills, orthopnea, PND. Her husband used to see Dr. Wynonia Lawman. She wanted to see me to establish with a cardiologist.   Wt Readings from Last 3 Encounters:  04/25/13 117 lb 1.9 oz (53.125 kg)  12/12/12 124 lb 12.5 oz (56.6 kg)  05/30/12 111 lb (50.349 kg)     Past Medical History  Diagnosis Date  . GERD (gastroesophageal reflux disease)   . Hypothyroidism   . Arthritis   . Paroxysmal atrial fibrillation   . Renal lesion     1cm left kidney  . Osteoarthritis   . Osteoporosis   . COPD (chronic obstructive pulmonary disease)   . Hiatal hernia   . Esophageal stricture     s/p repeated dilations, Dr. Watt Climes  . Pulmonary nodules   . Anxiety   . Hyperlipidemia     Past Surgical History  Procedure Laterality Date  . Cataract extraction w/ intraocular lens  implant, bilateral  '09  . Abdominal hysterectomy    . Breast enhancement surgery  2006  . Esophageal dilation    . Knee  arthroscopy  02/22/2011    Procedure: ARTHROSCOPY KNEE;  Surgeon: Gearlean Alf, MD;  Location: [facility];  Service: Orthopedics;  Laterality: Right;  WITH DEBRIDEMENT     Current Outpatient Prescriptions  Medication Sig Dispense Refill  . ALPRAZolam (XANAX) 0.25 MG tablet Take 1 tablet by mouth as needed for anxiety or sleep.       . budesonide-formoterol (SYMBICORT) 160-4.5 MCG/ACT inhaler Inhale 2 puffs into the lungs 2 (two) times daily as needed (shortness of breath).      . fluconazole (DIFLUCAN) 100 MG tablet Take 1 tablet (100 mg total) by mouth daily.  7 tablet  0  . levothyroxine (SYNTHROID, LEVOTHROID) 112 MCG tablet Take 112 mcg by mouth daily.      . ondansetron (ZOFRAN ODT) 4 MG disintegrating tablet 4mg  ODT q4 hours prn nausea/vomit  10 tablet  0  . pravastatin (PRAVACHOL) 80 MG tablet Take 1 tablet by mouth daily.      . traMADol (ULTRAM) 50 MG tablet Take 50 mg by mouth every 6 (six) hours as needed for moderate pain or severe pain.        No current facility-administered medications for this visit.    Allergies:    Allergies  Allergen Reactions  . Codeine     hallucination    Social History:  The patient  reports that she quit smoking about  16 years ago. Her smoking use included Cigarettes. She has a 30 pack-year smoking history. She has never used smokeless tobacco. She reports that she does not drink alcohol or use illicit drugs.   Family History  Problem Relation Age of Onset  . Heart disease Father   . Rheum arthritis Father   . Heart failure Mother   . Diabetes Brother     ROS:  Please see the history of present illness.   No bleeding, no syncope, no chest pain, no fevers, no rashes, no strokelike symptoms   All other systems reviewed and negative.   PHYSICAL EXAM: VS:  BP 128/80  Pulse 72  Ht 5\' 5"  (1.651 m)  Wt 117 lb 1.9 oz (53.125 kg)  BMI 19.49 kg/m2 Thin in no acute distress HEENT: normal, [place]/AT, EOMI Neck: no JVD, normal  carotid upstroke, no bruit Cardiac:  normal S1, S2; RRR; no murmur Lungs:  clear to auscultation bilaterally, no wheezing, rhonchi or rales Abd: soft, nontender, no hepatomegaly, no bruits Ext: no edema, 2+ distal pulses Skin: warm and dry GU: deferred Neuro: no focal abnormalities noted, AAO x 3  EKG:  04/25/13-normal sinus rhythm, possible left atrial enlargement, vertical axis, no other abnormalities  LABS: TSH 16.   ASSESSMENT AND PLAN:  78 year old with question of paroxysmal atrial fibrillation. Mild mitral regurgitation, hyperlipidemia, hypertension.  1. I reviewed her medical records and I do not see any history of atrial fibrillation documented. The patient does not recall having atrial fibrillation in the past either. I agree with Dr. Sheran Fava that if she did have a 2 fibrillation that we should consider anticoagulation given her risk factors. However at this time, there is no evidence of atrial fibrillation and we will continue with current therapy.  2. Mild mitral regurgitation-continue to monitor clinically. Doing well.  3. Hyperlipidemia-continue with statin.  4. Hypertension-currently well controlled. No changes made.  She requested a followup visit in one year. This is fine.  Signed, Candee Furbish, MD [facility]  04/25/2013 4:56 PM

## 2013-10-01 DIAGNOSIS — Z9049 Acquired absence of other specified parts of digestive tract: Secondary | ICD-10-CM | POA: Insufficient documentation

## 2014-03-16 ENCOUNTER — Other Ambulatory Visit: Payer: Self-pay

## 2014-03-16 DIAGNOSIS — Z1231 Encounter for screening mammogram for malignant neoplasm of breast: Secondary | ICD-10-CM

## 2014-03-18 ENCOUNTER — Telehealth: Payer: Self-pay | Admitting: Oncology

## 2014-03-18 NOTE — Telephone Encounter (Signed)
Spoke with patient and confirmed appt for 04/22/14 @ 10:30am. Dx:  Abnormal CBC Referring:  Bing Matter, PA

## 2014-03-19 ENCOUNTER — Telehealth: Payer: Self-pay | Admitting: Oncology

## 2014-03-19 NOTE — Telephone Encounter (Signed)
CHART DELIVERED ON 03/19/14.  TG °

## 2014-03-30 ENCOUNTER — Other Ambulatory Visit: Payer: Self-pay | Admitting: Family Medicine

## 2014-03-30 DIAGNOSIS — M810 Age-related osteoporosis without current pathological fracture: Secondary | ICD-10-CM

## 2014-04-02 ENCOUNTER — Other Ambulatory Visit: Payer: Self-pay

## 2014-04-02 ENCOUNTER — Ambulatory Visit
Admission: RE | Admit: 2014-04-02 | Discharge: 2014-04-02 | Disposition: A | Discharge status: Home / Self Care | Payer: Medicare Other | Source: Ambulatory Visit

## 2014-04-02 ENCOUNTER — Ambulatory Visit
Admission: RE | Admit: 2014-04-02 | Discharge: 2014-04-02 | Disposition: A | Discharge status: Home / Self Care | Payer: Medicare Other | Source: Ambulatory Visit | Attending: Family Medicine | Admitting: Family Medicine

## 2014-04-02 DIAGNOSIS — Z1231 Encounter for screening mammogram for malignant neoplasm of breast: Secondary | ICD-10-CM

## 2014-04-02 DIAGNOSIS — M810 Age-related osteoporosis without current pathological fracture: Secondary | ICD-10-CM

## 2014-04-03 ENCOUNTER — Telehealth: Payer: Self-pay | Admitting: Hematology & Oncology

## 2014-04-03 NOTE — Telephone Encounter (Signed)
Received message from MD to schedule pt, Dr. Marin Olp is aware pt has 4-13 with Dr. Alen Blew so he said to leave appointment as is.

## 2014-04-22 ENCOUNTER — Ambulatory Visit: Payer: Medicare Other

## 2014-04-22 ENCOUNTER — Other Ambulatory Visit (HOSPITAL_COMMUNITY)
Admission: RE | Admit: 2014-04-22 | Discharge: 2014-04-22 | Disposition: A | Discharge status: Home / Self Care | Payer: Medicare Other | Source: Ambulatory Visit | Attending: Oncology | Admitting: Oncology

## 2014-04-22 ENCOUNTER — Other Ambulatory Visit: Payer: Self-pay

## 2014-04-22 ENCOUNTER — Telehealth: Payer: Self-pay | Admitting: Oncology

## 2014-04-22 ENCOUNTER — Encounter: Payer: Self-pay | Admitting: Oncology

## 2014-04-22 ENCOUNTER — Other Ambulatory Visit (HOSPITAL_BASED_OUTPATIENT_CLINIC_OR_DEPARTMENT_OTHER): Payer: Medicare Other

## 2014-04-22 ENCOUNTER — Ambulatory Visit (HOSPITAL_BASED_OUTPATIENT_CLINIC_OR_DEPARTMENT_OTHER): Payer: Medicare Other | Admitting: Oncology

## 2014-04-22 VITALS — BP 162/69 | HR 71 | Temp 97.9°F | Resp 18 | Ht 65.0 in | Wt 122.2 lb

## 2014-04-22 DIAGNOSIS — D72820 Lymphocytosis (symptomatic): Secondary | ICD-10-CM

## 2014-04-22 LAB — CBC WITH DIFFERENTIAL/PLATELET
BASO%: 0.6 % (ref 0.0–2.0)
Basophils Absolute: 0 10*3/uL (ref 0.0–0.1)
EOS%: 3 % (ref 0.0–7.0)
Eosinophils Absolute: 0.2 10*3/uL (ref 0.0–0.5)
HEMATOCRIT: 36.6 % (ref 34.8–46.6)
HGB: 12 g/dL (ref 11.6–15.9)
LYMPH%: 47.5 % (ref 14.0–49.7)
MCH: 31.8 pg (ref 25.1–34.0)
MCHC: 32.8 g/dL (ref 31.5–36.0)
MCV: 97.1 fL (ref 79.5–101.0)
MONO#: 0.6 10*3/uL (ref 0.1–0.9)
MONO%: 9 % (ref 0.0–14.0)
NEUT%: 39.9 % (ref 38.4–76.8)
NEUTROS ABS: 2.7 10*3/uL (ref 1.5–6.5)
Platelets: 272 10*3/uL (ref 145–400)
RBC: 3.77 10*6/uL (ref 3.70–5.45)
RDW: 13.3 % (ref 11.2–14.5)
WBC: 6.7 10*3/uL (ref 3.9–10.3)
lymph#: 3.2 10*3/uL (ref 0.9–3.3)

## 2014-04-22 NOTE — Progress Notes (Signed)
Checked in new pt with no financial concerns. °

## 2014-04-22 NOTE — Progress Notes (Signed)
Please see consult note.  

## 2014-04-22 NOTE — Consult Note (Signed)
Reason for Referral:  Lymphocytosis.   HPI: 79 year old woman native of this area which she lived the majority of her life. She has a past medical history significant for hyperlipidemia and hypothyroidism but for the most part relatively healthy. She currently lives by herself after the death of her husband in 24-Apr-2010. Following his death she had a period of depression and weight loss but seems to have resolved at this time. She was noted on a routine CBC in February 2016 to have a slight lymphocytosis. Her total white cell count was normal at 5.7 with a normal hemoglobin of 12 and a platelet count of 313. Her lymphocyte percentage was slightly elevated at 60% with her neutrophil count slightly low at 23%. Her TSH was normal so is her chemistries. A repeat a CBC done on 03/16/2014 showed her white cell count continue to be normal at 5.5 with a hemoglobin of 12.5 and a platelet count of 342 normal. Her lymphocyte percentage slow the still elevated at 55% but still abnormal with the upper limit of normal 46%. Her pathology smear review showed some smudge cells and atypical lymphocytes. For that reason she was referred to me for evaluation. Clinically she is completely asymptomatic. She does not report any headaches, blurry vision, double vision, syncope or seizures. She does not report any fevers, chills, sweats or weight loss. She does not report any chest pain, palpitation, orthopnea or leg edema. She does not report any cough, hemoptysis or hematemesis. She is not reporting nausea, vomiting, abdominal pain Hematochezia, melena, constipation or diarrhea. She does not report any frequency, urgency, hesitancy. She does not report any skeletal complaints of arthralgias or myalgias. She does not report any lymphadenopathy or petechiae. She has not reported any anxiety or depression. The remaining review of systems unremarkable.   Past Medical History  Diagnosis Date  . GERD (gastroesophageal reflux disease)   .  Hypothyroidism   . Arthritis   . Paroxysmal atrial fibrillation   . Renal lesion     1cm left kidney  . Osteoarthritis   . Osteoporosis   . COPD (chronic obstructive pulmonary disease)   . Hiatal hernia   . Esophageal stricture     s/p repeated dilations, Dr. Watt Climes  . Pulmonary nodules   . Anxiety   . Hyperlipidemia   :  Past Surgical History  Procedure Laterality Date  . Cataract extraction w/ intraocular lens  implant, bilateral  '09  . Abdominal hysterectomy    . Breast enhancement surgery  04/23/04  . Esophageal dilation    . Knee arthroscopy  02/22/2011    Procedure: ARTHROSCOPY KNEE;  Surgeon: Gearlean Alf, MD;  Location: [facility];  Service: Orthopedics;  Laterality: Right;  WITH DEBRIDEMENT   :   Current outpatient prescriptions:  .  ALPRAZolam (XANAX) 0.25 MG tablet, Take 1 tablet by mouth as needed for anxiety or sleep. , Disp: , Rfl:  .  aspirin 81 MG tablet, Take 81 mg by mouth daily., Disp: , Rfl:  .  budesonide-formoterol (SYMBICORT) 160-4.5 MCG/ACT inhaler, Inhale 2 puffs into the lungs 2 (two) times daily as needed (shortness of breath)., Disp: , Rfl:  .  Cholecalciferol (VITAMIN D3) 2000 UNITS TABS, Take 2,000 Units by mouth daily., Disp: , Rfl:  .  ibandronate (BONIVA) 150 MG tablet, Take 150 mg by mouth every 30 (thirty) days. Take in the morning with a full glass of water, on an empty stomach, and do not take anything else by mouth or  lie down for the next 30 min., Disp: , Rfl:  .  levothyroxine (SYNTHROID, LEVOTHROID) 100 MCG tablet, Take 100 mcg by mouth daily., Disp: , Rfl:  .  mirtazapine (REMERON) 15 MG tablet, Take 15 mg by mouth at bedtime., Disp: , Rfl:  .  omeprazole (PRILOSEC) 40 MG capsule, Take 40 mg by mouth at bedtime., Disp: , Rfl:  .  ondansetron (ZOFRAN ODT) 4 MG disintegrating tablet, 4mg  ODT q4 hours prn nausea/vomit, Disp: 10 tablet, Rfl: 0 .  pravastatin (PRAVACHOL) 80 MG tablet, Take 80 mg by mouth daily., Disp: , Rfl:  .   traMADol (ULTRAM) 50 MG tablet, Take 50 mg by mouth every 6 (six) hours as needed for moderate pain or severe pain. , Disp: , Rfl: :  Allergies  Allergen Reactions  . Codeine     hallucination  :  Family History  Problem Relation Age of Onset  . Heart disease Father   . Rheum arthritis Father   . Heart failure Mother   . Diabetes Brother   :  History   Social History  . Marital Status: Widowed    Spouse Name: N/A  . Number of Children: N/A  . Years of Education: N/A   Occupational History  . Not on file.   Social History Main Topics  . Smoking status: Former Smoker -- 1.00 packs/day for 30 years    Types: Cigarettes    Quit date: 01/09/1997  . Smokeless tobacco: Never Used  . Alcohol Use: No  . Drug Use: No  . Sexual Activity: Not on file   Other Topics Concern  . Not on file   Social History Narrative  . No narrative on file  :  Pertinent items are noted in HPI.  Exam: ECOG 1 Blood pressure 162/69, pulse 71, temperature 97.9 F (36.6 C), temperature source Oral, resp. rate 18, height 5\' 5"  (1.651 m), weight 122 lb 3.2 oz (55.43 kg), SpO2 99 %. General appearance: alert and cooperative Head: Normocephalic, without obvious abnormality Throat: lips, mucosa, and tongue normal; teeth and gums normal Neck: no adenopathy Back: negative Resp: clear to auscultation bilaterally Cardio: regular rate and rhythm, S1, S2 normal, no murmur, click, rub or gallop GI: soft, non-tender; bowel sounds normal; no masses,  no organomegaly Extremities: extremities normal, atraumatic, no cyanosis or edema Pulses: 2+ and symmetric Skin: Skin color, texture, turgor normal. No rashes or lesions Lymph nodes: Cervical, supraclavicular, and axillary nodes normal.     Assessment and Plan:   79 year old woman with lymphocytosis discovered incidentally on a routine CBC. His total white cell count is 5.5 with lymphocyte percentage she was initially at 60% in February 2016 and dropped  to 55% and March 2017. The differential diagnosis was discussed today with the patient and her friend that accompanied her today. A lymphoproliferative disorder is always a consideration in this particular setting. Given the fact that she had few atypical lymphocytes as well as smudge cells raise the possibility of CLL as a potential diagnosis. Other lymphoproliferative disorder such as mantle cell lymphoma and other lymphomas could be a possibility. Reactive leukocytosis could also be a potential issue related to a virus infection especially in the setting of a very little lymphocytosis that is potentially improving.  From a management standpoint I recommended obtaining a peripheral blood for flow cytometry to document any specific abnormalities. At this time, regardless of the diagnosis she is completely asymptomatic and there is really no need for any intervention. I have recommended observation  and surveillance even if she does have indeed CLL.  All her questions were answered today to her satisfaction. They have recommended repeat CBC and a visit in 6 months.

## 2014-04-22 NOTE — Telephone Encounter (Signed)
Pt confirmed labs/ov per 04/13 POF, gave pt AVS and Calendar..... KJ °

## 2014-04-24 LAB — FLOW CYTOMETRY

## 2014-04-28 ENCOUNTER — Telehealth: Payer: Self-pay | Admitting: *Deleted

## 2014-04-28 NOTE — Telephone Encounter (Signed)
TC from patient requesting results from CBC done 04/22/14. Reviewed labs and all results WNL. Relayed this information to patient. She verbalized understanding.  No further needs identified.

## 2014-04-28 NOTE — Telephone Encounter (Signed)
This RN called and spoke with patient and informed her that her lab results were good. Patient verbalized understanding.

## 2014-10-21 ENCOUNTER — Other Ambulatory Visit: Payer: Self-pay

## 2014-10-21 ENCOUNTER — Ambulatory Visit: Payer: Self-pay | Admitting: Oncology

## 2015-02-02 ENCOUNTER — Other Ambulatory Visit: Payer: Self-pay | Admitting: Family Medicine

## 2015-02-02 DIAGNOSIS — M25471 Effusion, right ankle: Secondary | ICD-10-CM

## 2015-02-09 ENCOUNTER — Ambulatory Visit
Admission: RE | Admit: 2015-02-09 | Discharge: 2015-02-09 | Disposition: A | Discharge status: Home / Self Care | Payer: Medicare Other | Source: Ambulatory Visit | Attending: Family Medicine | Admitting: Family Medicine

## 2015-02-09 DIAGNOSIS — M25471 Effusion, right ankle: Secondary | ICD-10-CM

## 2015-04-01 ENCOUNTER — Other Ambulatory Visit: Payer: Self-pay

## 2015-04-01 DIAGNOSIS — Z1231 Encounter for screening mammogram for malignant neoplasm of breast: Secondary | ICD-10-CM

## 2015-04-20 ENCOUNTER — Ambulatory Visit: Payer: Self-pay

## 2015-09-10 ENCOUNTER — Other Ambulatory Visit: Payer: Self-pay | Admitting: Family Medicine

## 2015-09-10 DIAGNOSIS — Z1231 Encounter for screening mammogram for malignant neoplasm of breast: Secondary | ICD-10-CM

## 2015-09-17 ENCOUNTER — Ambulatory Visit
Admission: RE | Admit: 2015-09-17 | Discharge: 2015-09-17 | Disposition: A | Discharge status: Home / Self Care | Payer: Medicare Other | Source: Ambulatory Visit | Attending: Family Medicine | Admitting: Family Medicine

## 2015-09-17 DIAGNOSIS — Z1231 Encounter for screening mammogram for malignant neoplasm of breast: Secondary | ICD-10-CM

## 2015-09-29 ENCOUNTER — Other Ambulatory Visit: Payer: Self-pay | Admitting: Family Medicine

## 2015-09-29 DIAGNOSIS — R928 Other abnormal and inconclusive findings on diagnostic imaging of breast: Secondary | ICD-10-CM

## 2015-10-04 ENCOUNTER — Ambulatory Visit
Admission: RE | Admit: 2015-10-04 | Discharge: 2015-10-04 | Disposition: A | Discharge status: Home / Self Care | Payer: Medicare Other | Source: Ambulatory Visit | Attending: Family Medicine | Admitting: Family Medicine

## 2015-10-04 ENCOUNTER — Other Ambulatory Visit: Payer: Self-pay | Admitting: Family Medicine

## 2015-10-04 DIAGNOSIS — R928 Other abnormal and inconclusive findings on diagnostic imaging of breast: Secondary | ICD-10-CM

## 2015-10-04 DIAGNOSIS — N6320 Unspecified lump in the left breast, unspecified quadrant: Secondary | ICD-10-CM

## 2015-10-04 DIAGNOSIS — R2232 Localized swelling, mass and lump, left upper limb: Secondary | ICD-10-CM

## 2015-10-14 ENCOUNTER — Ambulatory Visit
Admission: RE | Admit: 2015-10-14 | Discharge: 2015-10-14 | Disposition: A | Discharge status: Home / Self Care | Payer: Medicare Other | Source: Ambulatory Visit | Attending: Gastroenterology | Admitting: Gastroenterology

## 2015-10-14 ENCOUNTER — Other Ambulatory Visit: Payer: Self-pay | Admitting: Gastroenterology

## 2015-10-14 DIAGNOSIS — R109 Unspecified abdominal pain: Secondary | ICD-10-CM

## 2015-10-14 DIAGNOSIS — A09 Infectious gastroenteritis and colitis, unspecified: Secondary | ICD-10-CM

## 2015-10-19 ENCOUNTER — Ambulatory Visit
Admission: RE | Admit: 2015-10-19 | Discharge: 2015-10-19 | Disposition: A | Discharge status: Home / Self Care | Payer: Medicare Other | Source: Ambulatory Visit | Attending: Family Medicine | Admitting: Family Medicine

## 2015-10-19 ENCOUNTER — Other Ambulatory Visit: Payer: Self-pay | Admitting: Family Medicine

## 2015-10-19 ENCOUNTER — Other Ambulatory Visit: Payer: Self-pay

## 2015-10-19 DIAGNOSIS — N6320 Unspecified lump in the left breast, unspecified quadrant: Secondary | ICD-10-CM

## 2015-10-19 DIAGNOSIS — R2232 Localized swelling, mass and lump, left upper limb: Secondary | ICD-10-CM

## 2015-10-19 HISTORY — PX: BREAST BIOPSY: SHX20

## 2015-10-27 ENCOUNTER — Other Ambulatory Visit: Payer: Self-pay

## 2016-01-24 ENCOUNTER — Encounter (HOSPITAL_COMMUNITY): Payer: Self-pay

## 2016-01-24 ENCOUNTER — Observation Stay (HOSPITAL_COMMUNITY)
Admission: EM | Admit: 2016-01-24 | Discharge: 2016-01-27 | Disposition: A | Discharge status: Home / Self Care | Payer: Medicare Other | Attending: Family Medicine | Admitting: Family Medicine

## 2016-01-24 DIAGNOSIS — K219 Gastro-esophageal reflux disease without esophagitis: Secondary | ICD-10-CM | POA: Diagnosis present

## 2016-01-24 DIAGNOSIS — Z66 Do not resuscitate: Secondary | ICD-10-CM | POA: Insufficient documentation

## 2016-01-24 DIAGNOSIS — M1990 Unspecified osteoarthritis, unspecified site: Secondary | ICD-10-CM | POA: Diagnosis not present

## 2016-01-24 DIAGNOSIS — I480 Paroxysmal atrial fibrillation: Secondary | ICD-10-CM | POA: Insufficient documentation

## 2016-01-24 DIAGNOSIS — R739 Hyperglycemia, unspecified: Secondary | ICD-10-CM | POA: Diagnosis not present

## 2016-01-24 DIAGNOSIS — E039 Hypothyroidism, unspecified: Secondary | ICD-10-CM | POA: Diagnosis present

## 2016-01-24 DIAGNOSIS — E785 Hyperlipidemia, unspecified: Secondary | ICD-10-CM | POA: Diagnosis present

## 2016-01-24 DIAGNOSIS — Z79899 Other long term (current) drug therapy: Secondary | ICD-10-CM | POA: Insufficient documentation

## 2016-01-24 DIAGNOSIS — R918 Other nonspecific abnormal finding of lung field: Secondary | ICD-10-CM | POA: Diagnosis present

## 2016-01-24 DIAGNOSIS — J449 Chronic obstructive pulmonary disease, unspecified: Secondary | ICD-10-CM | POA: Diagnosis present

## 2016-01-24 DIAGNOSIS — F419 Anxiety disorder, unspecified: Secondary | ICD-10-CM | POA: Diagnosis not present

## 2016-01-24 DIAGNOSIS — Z87891 Personal history of nicotine dependence: Secondary | ICD-10-CM | POA: Diagnosis not present

## 2016-01-24 DIAGNOSIS — Z7982 Long term (current) use of aspirin: Secondary | ICD-10-CM | POA: Diagnosis not present

## 2016-01-24 DIAGNOSIS — R112 Nausea with vomiting, unspecified: Secondary | ICD-10-CM | POA: Diagnosis present

## 2016-01-24 DIAGNOSIS — E876 Hypokalemia: Secondary | ICD-10-CM | POA: Diagnosis not present

## 2016-01-24 DIAGNOSIS — E860 Dehydration: Secondary | ICD-10-CM | POA: Diagnosis not present

## 2016-01-24 DIAGNOSIS — A084 Viral intestinal infection, unspecified: Secondary | ICD-10-CM | POA: Diagnosis present

## 2016-01-24 DIAGNOSIS — D72820 Lymphocytosis (symptomatic): Secondary | ICD-10-CM

## 2016-01-24 DIAGNOSIS — D72829 Elevated white blood cell count, unspecified: Secondary | ICD-10-CM | POA: Diagnosis present

## 2016-01-24 DIAGNOSIS — R1013 Epigastric pain: Secondary | ICD-10-CM

## 2016-01-24 LAB — COMPREHENSIVE METABOLIC PANEL
ALT: 19 U/L (ref 14–54)
ANION GAP: 9 (ref 5–15)
AST: 28 U/L (ref 15–41)
Albumin: 4.3 g/dL (ref 3.5–5.0)
Alkaline Phosphatase: 123 U/L (ref 38–126)
BILIRUBIN TOTAL: 0.8 mg/dL (ref 0.3–1.2)
BUN: 10 mg/dL (ref 6–20)
CHLORIDE: 94 mmol/L — AB (ref 101–111)
CO2: 32 mmol/L (ref 22–32)
Calcium: 9.2 mg/dL (ref 8.9–10.3)
Creatinine, Ser: 0.95 mg/dL (ref 0.44–1.00)
GFR calc Af Amer: >60 mL/min (ref >60)
GFR calc non Af Amer: 56 mL/min — ABNORMAL LOW (ref >60)
Glucose, Bld: 144 mg/dL — ABNORMAL HIGH (ref 65–99)
Potassium: 3.4 mmol/L — ABNORMAL LOW (ref 3.5–5.1)
SODIUM: 135 mmol/L (ref 135–145)
TOTAL PROTEIN: 7.5 g/dL (ref 6.5–8.1)

## 2016-01-24 LAB — CBC
HEMATOCRIT: 36.7 % (ref 36.0–46.0)
HEMOGLOBIN: 11.9 g/dL — AB (ref 12.0–15.0)
MCH: 29.8 pg (ref 26.0–34.0)
MCHC: 32.4 g/dL (ref 30.0–36.0)
MCV: 92 fL (ref 78.0–100.0)
Platelets: 361 10*3/uL (ref 150–400)
RBC: 3.99 MIL/uL (ref 3.87–5.11)
RDW: 14.5 % (ref 11.5–15.5)
WBC: 17.6 10*3/uL — ABNORMAL HIGH (ref 4.0–10.5)

## 2016-01-24 LAB — LIPASE, BLOOD: LIPASE: 18 U/L (ref 11–51)

## 2016-01-24 MED ORDER — SODIUM CHLORIDE 0.9 % IV BOLUS (SEPSIS)
500.0000 mL | Freq: Once | INTRAVENOUS | Status: AC
  Administered 2016-01-25: 500 mL via INTRAVENOUS

## 2016-01-24 MED ORDER — ONDANSETRON HCL 4 MG/2ML IJ SOLN
4.0000 mg | Freq: Once | INTRAMUSCULAR | Status: AC | PRN
  Administered 2016-01-24: 4 mg via INTRAVENOUS
  Filled 2016-01-24: qty 2

## 2016-01-24 MED ORDER — LORAZEPAM 2 MG/ML IJ SOLN
1.0000 mg | Freq: Once | INTRAMUSCULAR | Status: AC
  Administered 2016-01-25: 1 mg via INTRAVENOUS
  Filled 2016-01-24: qty 1

## 2016-01-24 NOTE — ED Triage Notes (Signed)
Pt complains of vomiting since last night, she thinks she ate bad potato salad last night, only one episode of diarrhea this evening

## 2016-01-24 NOTE — ED Provider Notes (Addendum)
[facility] DEPT Provider Note: Georgena Spurling, MD, FACEP  CSN: [id number] MRN: [id number] ARRIVAL: 01/24/16 at [place]  By signing my name below, I, Dolores Hoose, attest that this documentation has been prepared under the direction and in the presence of Shanon Rosser, MD . Electronically Signed: Dolores Hoose, Scribe. 01/24/2016. 11:26 PM.  CHIEF COMPLAINT  Vomiting   HISTORY OF PRESENT ILLNESS  Amy [name] is a 81 y.o. female with pmhx of COPD, GERD and HLD who presents to the Emergency Department complaining of sudden-onset vomiting onset today. Pt states that her symptoms started last night as a feeling of GERD but have progressed to vomiting and nausea. She notes she has vomited about 5-6 times today. Pt reports associated epigastric discomfort, nausea, chills, diaphoresis and diarrhea. She describes her abdominal discomfort as "not severe, just annoying". She was given Zofran after arrival to the ED with some relief. Pt denies any fever.   Past Medical History:  Diagnosis Date  . Anxiety   . Arthritis   . COPD (chronic obstructive pulmonary disease) ([place])   . Esophageal stricture    s/p repeated dilations, Dr. Watt Climes  . GERD (gastroesophageal reflux disease)   . Hiatal hernia   . Hyperlipidemia   . Hypothyroidism   . Osteoarthritis   . Osteoporosis   . Paroxysmal atrial fibrillation ([facility])   . Pulmonary nodules   . Renal lesion    1cm left kidney    Past Surgical History:  Procedure Laterality Date  . ABDOMINAL HYSTERECTOMY    . BREAST ENHANCEMENT SURGERY  2006  . CATARACT EXTRACTION W/ INTRAOCULAR LENS  IMPLANT, BILATERAL  '09  . ESOPHAGEAL DILATION    . KNEE ARTHROSCOPY  02/22/2011   Procedure: ARTHROSCOPY KNEE;  Surgeon: Gearlean Alf, MD;  Location: [facility];  Service: Orthopedics;  Laterality: Right;  WITH DEBRIDEMENT     Family History  Problem Relation Age of Onset  . Heart disease Father   . Rheum arthritis Father   .  Heart failure Mother   . Diabetes Brother     Social History  Substance Use Topics  . Smoking status: Former Smoker    Packs/day: 1.00    Years: 30.00    Types: Cigarettes    Quit date: 01/09/1997  . Smokeless tobacco: Never Used  . Alcohol use No    Prior to Admission medications   Medication Sig Start Date End Date Taking? Authorizing Provider  ALPRAZolam Duanne Moron) 0.25 MG tablet Take 1 tablet by mouth as needed for anxiety or sleep.  03/26/12  Yes Historical Provider, MD  aspirin 81 MG tablet Take 81 mg by mouth daily.   Yes Historical Provider, MD  budesonide-formoterol (SYMBICORT) 160-4.5 MCG/ACT inhaler Inhale 2 puffs into the lungs 2 (two) times daily as needed (shortness of breath). 05/30/12  Yes Tanda Rockers, MD  Cholecalciferol (VITAMIN D3) 2000 UNITS TABS Take 2,000 Units by mouth daily.   Yes Historical Provider, MD  levothyroxine (SYNTHROID, LEVOTHROID) 100 MCG tablet Take 100 mcg by mouth daily.   Yes Historical Provider, MD  mirtazapine (REMERON) 15 MG tablet Take 15 mg by mouth at bedtime. 10/17/13  Yes Historical Provider, MD  omeprazole (PRILOSEC) 40 MG capsule Take 40 mg by mouth at bedtime. 05/12/13  Yes Historical Provider, MD  pravastatin (PRAVACHOL) 80 MG tablet Take 80 mg by mouth daily.   Yes Historical Provider, MD  traMADol (ULTRAM) 50 MG tablet Take 50 mg by mouth every 6 (six) hours  as needed for moderate pain or severe pain.    Yes Historical Provider, MD    Allergies Codeine   REVIEW OF SYSTEMS  Negative except as noted here or in the History of Present Illness.   PHYSICAL EXAMINATION  Initial Vital Signs Blood pressure 147/75, pulse 94, temperature 98.2 F (36.8 C), temperature source Oral, resp. rate 12, height 5\' 5"  (1.651 m), weight 127 lb (57.6 kg), SpO2 94 %.  Examination General: Well-developed, well-nourished female in no acute distress; appearance consistent with age of record HENT: normocephalic; atraumatic Eyes: pupils equal, round and  reactive to light; extraocular muscles intact; lens implants in place Neck: supple Heart: regular rate and rhythm Lungs: clear to auscultation bilaterally Abdomen: soft; nondistended; mild epigastric tenderness; no masses or hepatosplenomegaly; bowel sounds present Extremities: No deformity; full range of motion; pulses normal Neurologic: Awake, alert and oriented; motor function intact in all extremities and symmetric; no facial droop Skin: Warm and dry Psychiatric: Normal mood and affect   RESULTS  Summary of this visit's results, reviewed by myself:   EKG Interpretation  Date/Time:  Monday January 24 2016 23:21:54 EST Ventricular Rate:  93 PR Interval:    QRS Duration: 103 QT Interval:  373 QTC Calculation: 464 R Axis:   86 Text Interpretation:  Sinus rhythm Left atrial enlargement Borderline right axis deviation No significant change was found Confirmed by Jodey Burbano  MD, Jenny Reichmann ([id number]) on 01/25/2016 3:42:20 AM      Laboratory Studies: Results for orders placed or performed during the hospital encounter of 01/24/16 (from the past 24 hour(s))  Lipase, blood     Status: None   Collection Time: 01/24/16 11:15 PM  Result Value Ref Range   Lipase 18 11 - 51 U/L  Comprehensive metabolic panel     Status: Abnormal   Collection Time: 01/24/16 11:15 PM  Result Value Ref Range   Sodium 135 135 - 145 mmol/L   Potassium 3.4 (L) 3.5 - 5.1 mmol/L   Chloride 94 (L) 101 - 111 mmol/L   CO2 32 22 - 32 mmol/L   Glucose, Bld 144 (H) 65 - 99 mg/dL   BUN 10 6 - 20 mg/dL   Creatinine, Ser 0.95 0.44 - 1.00 mg/dL   Calcium 9.2 8.9 - 10.3 mg/dL   Total Protein 7.5 6.5 - 8.1 g/dL   Albumin 4.3 3.5 - 5.0 g/dL   AST 28 15 - 41 U/L   ALT 19 14 - 54 U/L   Alkaline Phosphatase 123 38 - 126 U/L   Total Bilirubin 0.8 0.3 - 1.2 mg/dL   GFR calc non Af Amer 56 (L) >60 mL/min   GFR calc Af Amer >60 >60 mL/min   Anion gap 9 5 - 15  CBC     Status: Abnormal   Collection Time: 01/24/16 11:15 PM  Result  Value Ref Range   WBC 17.6 (H) 4.0 - 10.5 K/uL   RBC 3.99 3.87 - 5.11 MIL/uL   Hemoglobin 11.9 (L) 12.0 - 15.0 g/dL   HCT 36.7 36.0 - 46.0 %   MCV 92.0 78.0 - 100.0 fL   MCH 29.8 26.0 - 34.0 pg   MCHC 32.4 30.0 - 36.0 g/dL   RDW 14.5 11.5 - 15.5 %   Platelets 361 150 - 400 K/uL   Imaging Studies: No results found.  ED COURSE  Nursing notes and initial vitals signs, including pulse oximetry, reviewed.  Vitals:   01/25/16 0400 01/25/16 0430 01/25/16 0500 01/25/16 0530  BP: 135/62  142/61 140/70 138/68  Pulse: 98 105 95 93  Resp: 22 18 14 17   Temp:      TempSrc:      SpO2: 91% 92% 94% 92%  Weight:      Height:       3:01 AM Patient drinking fluids without emesis after Zofran. Still complaining of heartburn in the epigastrium. Mild epigastric tenderness remains.  5:42 AM Patient unable to keep fluids on stomach at this time. Still complaining of epigastric discomfort. We'll have her admitted.    PROCEDURES    ED DIAGNOSES     ICD-9-CM ICD-10-CM   1. Viral gastroenteritis 008.8 A08.4    I personally performed the services described in this documentation, which was scribed in my presence. The recorded information has been reviewed and is accurate.      Shanon Rosser, MD 01/25/16 [place], MD 01/25/16 0630

## 2016-01-25 ENCOUNTER — Emergency Department (HOSPITAL_COMMUNITY): Payer: Medicare Other

## 2016-01-25 ENCOUNTER — Encounter (HOSPITAL_COMMUNITY): Payer: Self-pay | Admitting: Internal Medicine

## 2016-01-25 DIAGNOSIS — R1013 Epigastric pain: Secondary | ICD-10-CM

## 2016-01-25 DIAGNOSIS — R112 Nausea with vomiting, unspecified: Secondary | ICD-10-CM | POA: Diagnosis present

## 2016-01-25 DIAGNOSIS — D72829 Elevated white blood cell count, unspecified: Secondary | ICD-10-CM | POA: Diagnosis present

## 2016-01-25 DIAGNOSIS — J449 Chronic obstructive pulmonary disease, unspecified: Secondary | ICD-10-CM | POA: Diagnosis not present

## 2016-01-25 DIAGNOSIS — R918 Other nonspecific abnormal finding of lung field: Secondary | ICD-10-CM

## 2016-01-25 DIAGNOSIS — E785 Hyperlipidemia, unspecified: Secondary | ICD-10-CM

## 2016-01-25 DIAGNOSIS — E876 Hypokalemia: Secondary | ICD-10-CM | POA: Diagnosis present

## 2016-01-25 DIAGNOSIS — A084 Viral intestinal infection, unspecified: Secondary | ICD-10-CM | POA: Diagnosis not present

## 2016-01-25 DIAGNOSIS — R739 Hyperglycemia, unspecified: Secondary | ICD-10-CM | POA: Diagnosis present

## 2016-01-25 DIAGNOSIS — E860 Dehydration: Secondary | ICD-10-CM | POA: Diagnosis present

## 2016-01-25 LAB — CBC
HCT: 31.7 % — ABNORMAL LOW (ref 36.0–46.0)
Hemoglobin: 10.2 g/dL — ABNORMAL LOW (ref 12.0–15.0)
MCH: 30.1 pg (ref 26.0–34.0)
MCHC: 32.2 g/dL (ref 30.0–36.0)
MCV: 93.5 fL (ref 78.0–100.0)
Platelets: 324 10*3/uL (ref 150–400)
RBC: 3.39 MIL/uL — ABNORMAL LOW (ref 3.87–5.11)
RDW: 14.9 % (ref 11.5–15.5)
WBC: 14 10*3/uL — ABNORMAL HIGH (ref 4.0–10.5)

## 2016-01-25 LAB — CREATININE, SERUM
CREATININE: 0.7 mg/dL (ref 0.44–1.00)
GFR calc Af Amer: >60 mL/min (ref >60)

## 2016-01-25 LAB — URINALYSIS, ROUTINE W REFLEX MICROSCOPIC
BACTERIA UA: NONE SEEN
BILIRUBIN URINE: NEGATIVE
Glucose, UA: NEGATIVE mg/dL
KETONES UR: 20 mg/dL — AB
Nitrite: NEGATIVE
Protein, ur: NEGATIVE mg/dL
Specific Gravity, Urine: 1.016 (ref 1.005–1.030)
pH: 7 (ref 5.0–8.0)

## 2016-01-25 LAB — INFLUENZA PANEL BY PCR (TYPE A & B)
INFLAPCR: NEGATIVE
INFLBPCR: NEGATIVE

## 2016-01-25 LAB — MAGNESIUM: Magnesium: 1.8 mg/dL (ref 1.7–2.4)

## 2016-01-25 LAB — PHOSPHORUS: PHOSPHORUS: 2.7 mg/dL (ref 2.5–4.6)

## 2016-01-25 LAB — TSH: TSH: 0.204 u[IU]/mL — ABNORMAL LOW (ref 0.350–4.500)

## 2016-01-25 MED ORDER — ONDANSETRON HCL 4 MG/2ML IJ SOLN
4.0000 mg | Freq: Four times a day (QID) | INTRAMUSCULAR | Status: DC | PRN
  Administered 2016-01-25: 4 mg via INTRAVENOUS
  Filled 2016-01-25: qty 2

## 2016-01-25 MED ORDER — SODIUM CHLORIDE 0.9 % IV SOLN
INTRAVENOUS | Status: AC
  Administered 2016-01-25: 08:00:00 via INTRAVENOUS

## 2016-01-25 MED ORDER — PANTOPRAZOLE SODIUM 40 MG IV SOLR
40.0000 mg | Freq: Once | INTRAVENOUS | Status: AC
  Administered 2016-01-25: 40 mg via INTRAVENOUS
  Filled 2016-01-25: qty 40

## 2016-01-25 MED ORDER — LEVOTHYROXINE SODIUM 100 MCG IV SOLR
50.0000 ug | Freq: Every day | INTRAVENOUS | Status: DC
  Administered 2016-01-25: 50 ug via INTRAVENOUS
  Filled 2016-01-25 (×2): qty 5

## 2016-01-25 MED ORDER — SUCRALFATE 1 GM/10ML PO SUSP
1.0000 g | Freq: Three times a day (TID) | ORAL | Status: DC
  Administered 2016-01-25 – 2016-01-27 (×5): 1 g via ORAL
  Filled 2016-01-25 (×5): qty 10

## 2016-01-25 MED ORDER — SODIUM CHLORIDE 0.9 % IV BOLUS (SEPSIS)
500.0000 mL | Freq: Once | INTRAVENOUS | Status: AC
  Administered 2016-01-25: 500 mL via INTRAVENOUS

## 2016-01-25 MED ORDER — ALUM & MAG HYDROXIDE-SIMETH 200-200-20 MG/5ML PO SUSP
30.0000 mL | ORAL | Status: DC | PRN
  Administered 2016-01-25: 30 mL via ORAL
  Filled 2016-01-25: qty 30

## 2016-01-25 MED ORDER — OSELTAMIVIR PHOSPHATE 75 MG PO CAPS: 75.0000 mg | ORAL_CAPSULE | Freq: Two times a day (BID) | ORAL | Status: DC

## 2016-01-25 MED ORDER — ONDANSETRON HCL 4 MG/2ML IJ SOLN
4.0000 mg | Freq: Once | INTRAMUSCULAR | Status: AC
  Administered 2016-01-25: 4 mg via INTRAVENOUS
  Filled 2016-01-25: qty 2

## 2016-01-25 MED ORDER — ONDANSETRON HCL 4 MG/2ML IJ SOLN: 4.0000 mg | Freq: Three times a day (TID) | INTRAMUSCULAR | Status: DC | PRN

## 2016-01-25 MED ORDER — LORAZEPAM 2 MG/ML IJ SOLN
0.5000 mg | Freq: Two times a day (BID) | INTRAMUSCULAR | Status: DC | PRN
  Administered 2016-01-25: 0.5 mg via INTRAVENOUS
  Filled 2016-01-25: qty 1

## 2016-01-25 MED ORDER — ACETAMINOPHEN 325 MG PO TABS: 650.0000 mg | ORAL_TABLET | Freq: Four times a day (QID) | ORAL | Status: DC | PRN

## 2016-01-25 MED ORDER — PROCHLORPERAZINE EDISYLATE 5 MG/ML IJ SOLN: 10.0000 mg | Freq: Four times a day (QID) | INTRAMUSCULAR | Status: DC | PRN

## 2016-01-25 MED ORDER — ACETAMINOPHEN 650 MG RE SUPP: 650.0000 mg | Freq: Four times a day (QID) | RECTAL | Status: DC | PRN

## 2016-01-25 MED ORDER — FAMOTIDINE IN NACL 20-0.9 MG/50ML-% IV SOLN
20.0000 mg | Freq: Two times a day (BID) | INTRAVENOUS | Status: DC
  Administered 2016-01-25: 20 mg via INTRAVENOUS
  Filled 2016-01-25 (×2): qty 50

## 2016-01-25 MED ORDER — OSELTAMIVIR PHOSPHATE 30 MG PO CAPS
30.0000 mg | ORAL_CAPSULE | Freq: Two times a day (BID) | ORAL | Status: DC
  Administered 2016-01-25: 30 mg via ORAL
  Filled 2016-01-25 (×2): qty 1

## 2016-01-25 MED ORDER — FAMOTIDINE IN NACL 20-0.9 MG/50ML-% IV SOLN: 20.0000 mg | INTRAVENOUS | Status: DC

## 2016-01-25 MED ORDER — SUCRALFATE 1 G PO TABS
1.0000 g | ORAL_TABLET | Freq: Once | ORAL | Status: AC
  Administered 2016-01-25: 1 g via ORAL
  Filled 2016-01-25: qty 1

## 2016-01-25 MED ORDER — BUDESONIDE 0.25 MG/2ML IN SUSP
0.2500 mg | Freq: Two times a day (BID) | RESPIRATORY_TRACT | Status: DC
  Filled 2016-01-25 (×2): qty 2

## 2016-01-25 MED ORDER — HEPARIN SODIUM (PORCINE) 5000 UNIT/ML IJ SOLN
5000.0000 [IU] | Freq: Three times a day (TID) | INTRAMUSCULAR | Status: DC
  Administered 2016-01-25 – 2016-01-27 (×6): 5000 [IU] via SUBCUTANEOUS
  Filled 2016-01-25 (×6): qty 1

## 2016-01-25 MED ORDER — FAMOTIDINE IN NACL 20-0.9 MG/50ML-% IV SOLN
20.0000 mg | Freq: Two times a day (BID) | INTRAVENOUS | Status: DC
  Filled 2016-01-25: qty 50

## 2016-01-25 MED ORDER — MIRTAZAPINE 15 MG PO TABS
15.0000 mg | ORAL_TABLET | Freq: Every day | ORAL | Status: DC
  Administered 2016-01-25 – 2016-01-26 (×2): 15 mg via ORAL
  Filled 2016-01-25 (×2): qty 1

## 2016-01-25 MED ORDER — ONDANSETRON HCL 4 MG PO TABS: 4.0000 mg | ORAL_TABLET | Freq: Four times a day (QID) | ORAL | Status: DC | PRN

## 2016-01-25 MED ORDER — CHLORHEXIDINE GLUCONATE 0.12 % MT SOLN
15.0000 mL | Freq: Two times a day (BID) | OROMUCOSAL | Status: DC
  Administered 2016-01-25 – 2016-01-27 (×4): 15 mL via OROMUCOSAL
  Filled 2016-01-25 (×4): qty 15

## 2016-01-25 MED ORDER — POTASSIUM CHLORIDE IN NACL 40-0.9 MEQ/L-% IV SOLN
INTRAVENOUS | Status: DC
  Administered 2016-01-25 – 2016-01-26 (×2): 80 mL/h via INTRAVENOUS
  Filled 2016-01-25 (×4): qty 1000

## 2016-01-25 MED ORDER — IPRATROPIUM-ALBUTEROL 0.5-2.5 (3) MG/3ML IN SOLN: 3.0000 mL | Freq: Four times a day (QID) | RESPIRATORY_TRACT | Status: DC | PRN

## 2016-01-25 NOTE — ED Notes (Signed)
Attempted to call report to 5th floor.  RN is in shift change report.

## 2016-01-25 NOTE — ED Notes (Signed)
Pt did not pass po fluid challenge. Dr.Molpus notified.

## 2016-01-25 NOTE — Progress Notes (Signed)
Received from Dr. Marisue Humble Amy [name] is a 81 year old female with pmh COPD, hypothyroidism, and GERD; presents with epigastric pain with N/V 1 day. Unable to keep clear liquids down. Lab work reveals WBC 17.6 and  potassium 3.4. Given 1 mg Ativan, 8 mg Zofran, Protonix IV, Carafate 1 g, and 1 L of IV fluids without relief of symptoms. TRH called to admit for observation to a MedSurg bed. Acute abdominal series pending.

## 2016-01-25 NOTE — ED Notes (Signed)
Verbal order for IV fluids 559ml Bolus.

## 2016-01-25 NOTE — ED Notes (Signed)
Verbal order from Dr.Molpus for Zofran 4MG  IVP

## 2016-01-25 NOTE — H&P (Signed)
History and Physical    Amy [name] [id number] DOB: March 04, 1935 DOA: 01/24/2016  Referring Provider: Dr. Florina Ou  PCP: Tula Nakayama  Outpatient Specialists:  Dr. Watt Climes ([facility]) [facility] service (Dr. Melvyn Novas)  Patient coming from: home   Chief Complaint: nausea, vomiting, epigastric discomfort   HPI: Amy [name] is a 81 y.o. female with PMH significant for hypothyroidism, GERD, anxiety, COPD , GERD with esophageal stricture, HLD and pulmonary nodules; presented to ED complaining of intractable nausea, vomiting and epigastric discomfort. Patient's pain is vague diffuse, non radiated, and worse with vomiting; not relieving factors and 3-4/10 at the worst. Patient endorses generalized fatigue, weakness and chills. No CP, no SOB, no cough, no dysuria, no hematemesis, no melena, no hematochezia or any other complaints.   ED Course: abd x-ray w/o acute abnormalities, signs of dehydration on physical exam, elevated WBC's in the 17.6 and with hypokalemia. In ED she received 1 mg Ativan, 8 mg Zofran, Protonix IV, Carafate 1 g, and 1 L of IV fluids without relief of symptoms. TRH called to place in observation.   Review of Systems:  All other systems reviewed and apart from HPI, are negative.  Past Medical History:  Diagnosis Date  . Anxiety   . Arthritis   . COPD (chronic obstructive pulmonary disease) ([place])   . Esophageal stricture    s/p repeated dilations, Dr. Watt Climes  . GERD (gastroesophageal reflux disease)   . Hiatal hernia   . Hyperlipidemia   . Hypothyroidism   . Osteoarthritis   . Osteoporosis   . Paroxysmal atrial fibrillation ([facility])   . Pulmonary nodules   . Renal lesion    1cm left kidney    Past Surgical History:  Procedure Laterality Date  . ABDOMINAL HYSTERECTOMY    . BREAST ENHANCEMENT SURGERY  2006  . CATARACT EXTRACTION W/ INTRAOCULAR LENS  IMPLANT, BILATERAL  '09  . ESOPHAGEAL DILATION    . KNEE ARTHROSCOPY  02/22/2011   Procedure: ARTHROSCOPY KNEE;   Surgeon: Gearlean Alf, MD;  Location: [facility];  Service: Orthopedics;  Laterality: Right;  WITH DEBRIDEMENT      reports that she quit smoking about 19 years ago. Her smoking use included Cigarettes. She has a 30.00 pack-year smoking history. She has never used smokeless tobacco. She reports that she does not drink alcohol or use drugs.  Allergies  Allergen Reactions  . Codeine     hallucination    Family History  Problem Relation Age of Onset  . Heart disease Father   . Rheum arthritis Father   . Heart failure Mother   . Diabetes Brother      Prior to Admission medications   Medication Sig Start Date End Date Taking? Authorizing Provider  ALPRAZolam Duanne Moron) 0.25 MG tablet Take 1 tablet by mouth as needed for anxiety or sleep.  03/26/12  Yes Historical Provider, MD  aspirin 81 MG tablet Take 81 mg by mouth daily.   Yes Historical Provider, MD  budesonide-formoterol (SYMBICORT) 160-4.5 MCG/ACT inhaler Inhale 2 puffs into the lungs 2 (two) times daily as needed (shortness of breath). 05/30/12  Yes Tanda Rockers, MD  Cholecalciferol (VITAMIN D3) 2000 UNITS TABS Take 2,000 Units by mouth daily.   Yes Historical Provider, MD  levothyroxine (SYNTHROID, LEVOTHROID) 100 MCG tablet Take 100 mcg by mouth daily.   Yes Historical Provider, MD  mirtazapine (REMERON) 15 MG tablet Take 15 mg by mouth at bedtime. 10/17/13  Yes Historical Provider, MD  omeprazole ([place])  40 MG capsule Take 40 mg by mouth at bedtime. 05/12/13  Yes Historical Provider, MD  pravastatin (PRAVACHOL) 80 MG tablet Take 80 mg by mouth daily.   Yes Historical Provider, MD  traMADol (ULTRAM) 50 MG tablet Take 50 mg by mouth every 6 (six) hours as needed for moderate pain or severe pain.    Yes Historical Provider, MD    Physical Exam: Vitals:   01/25/16 0530 01/25/16 0600 01/25/16 0812 01/25/16 0908  BP: 138/68 140/68 132/67 129/64  Pulse: 93 103 96 88  Resp: 17 16 12 16   Temp:   99.7 F (37.6 C)  99.1 F (37.3 C)  TempSrc:   Oral Oral  SpO2: 92% 92% 92% 93%  Weight:      Height:        Constitutional: NAD, overall comfortable and denying CP and SOB. Feeling nauseated   Eyes: PERTLA, lids and conjunctivae normal, no icterus ENMT: Mucous membranes are moist. Posterior pharynx clear of any exudate or lesions. Normal dentition. No thrush  Neck: normal, supple, no masses, no thyromegaly, no JVD Respiratory: Clear to auscultation bilaterally, no wheezing, no crackles. Normal respiratory effort. No accessory muscle use.  Cardiovascular: S1 & S2 heard, regular rate and rhythm, no murmurs / rubs / gallops. No extremity edema. 2+ pedal pulses. No carotid bruits.  Abdomen: No distension, mild diffuse epigastric discomfort on deep palpation, no masses palpated. No hepatosplenomegaly. Bowel sounds normal.  Musculoskeletal: no clubbing / cyanosis. No joint deformity upper and lower extremities. Good ROM, no contractures. Normal muscle tone.  Skin: no rashes, lesions, ulcers. No induration Neurologic: CN 2-12 grossly intact. Sensation intact, DTR normal. Strength 5/5 in all 4 limbs.  Psychiatric: Normal judgment and insight. Alert and oriented x 3. Normal mood.    Labs on Admission: I have personally reviewed following labs and imaging studies  CBC:  Recent Labs Lab 01/24/16 2315  WBC 17.6*  HGB 11.9*  HCT 36.7  MCV 92.0  PLT [id number]   Basic Metabolic Panel:  Recent Labs Lab 01/24/16 2315  NA 135  K 3.4*  CL 94*  CO2 32  GLUCOSE 144*  BUN 10  CREATININE 0.95  CALCIUM 9.2   GFR: Estimated Creatinine Clearance: 43.9 mL/min (by C-G formula based on SCr of 0.95 mg/dL).   Liver Function Tests:  Recent Labs Lab 01/24/16 2315  AST 28  ALT 19  ALKPHOS 123  BILITOT 0.8  PROT 7.5  ALBUMIN 4.3    Recent Labs Lab 01/24/16 2315  LIPASE 18   Urine analysis:    Component Value Date/Time   COLORURINE YELLOW 01/25/2016 [place] 01/25/2016 0824   LABSPEC  1.016 01/25/2016 0824   PHURINE 7.0 01/25/2016 0824   GLUCOSEU NEGATIVE 01/25/2016 0824   HGBUR SMALL (A) 01/25/2016 0824   BILIRUBINUR NEGATIVE 01/25/2016 0824   KETONESUR 20 (A) 01/25/2016 0824   PROTEINUR NEGATIVE 01/25/2016 0824   UROBILINOGEN 0.2 12/14/2012 0541   NITRITE NEGATIVE 01/25/2016 0824   LEUKOCYTESUR LARGE (A) 01/25/2016 0824   Radiological Exams on Admission: Dg Abd Acute W/chest  Result Date: 01/25/2016 CLINICAL DATA:  Nausea vomiting.  Abdominal discomfort. EXAM: DG ABDOMEN ACUTE W/ 1V CHEST COMPARISON:  10/14/2015. FINDINGS: Mediastinum hilar structures are normal. Lungs are clear. No pleural effusion or pneumothorax. Heart size stable. No acute bony abnormality. Surgical clips right upper quadrant and pelvis. No bowel distention . Moderate stool volume. No free air. Degenerative changes thoracic spine and lumbar spine. IMPRESSION: 1. No acute cardiopulmonary  disease. 2. No acute intra-abdominal abnormality. No bowel distention. Moderate stool volume. Electronically Signed   By: Marcello Moores  Register   On: 01/25/2016 07:02    EKG:  No acute ischemic changes, normal rate and sinus rhythm   Assessment/Plan 1-intractable nausea and vomiting: appears to be secondary to Viral gastroenteritis. Patient also endorses generalized fatigue, weakness and chills; raising concerns for Flu. -will place in observation -provide IVF's resuscitation, electrolytes repletion and supportive care -will slowly advance diet and minimize PO meds initially  -will start empirically on tamiflu and check influenza PCR. -will follow clinical results  -abd x-ray w/o acute abnormalities -UA with positive LA, but neg nitrite and no dysuria. Will check urine cx and hold on antibiotics for now.  2-COPD GOLD III: stable currently -will use pulmicort (equivalent to home symbicort) -continue PRN Duoneb  3-Multiple pulmonary nodules -continue outpatient follow up with pulmonary service   4-GERD  (gastroesophageal reflux disease) -will use famotidine IV Q12H while having difficulties tolerating PO's  5-Hypothyroidism: -will check TSH -continue synthroid (IV currently)  6-Hyperlipidemia -will resume statins when tolerating PO  7-Dehydration -will provide IVF's and follow clinical response and capacity to tolerates PO's  8-Leukocytosis -most likely stress demargination from dehydration -will repeat CBC in am -will provide IVF's  9-Hypokalemia -due to GI loses -will replete as needed -will check Mg level  10-Hyperglycemia -no prior hx of diabetes -will check A1C -no need for correction unless CBG's > 200    Time: 55 minutes   DVT prophylaxis: heparin   Code Status: DNR  Family Communication: no family at bedside   Disposition Plan: anticipate discharge home in less than 2 midnights (once able to tolerate PO's and keep herself hydrated) Consults called: none  Admission status: Observation, med-surg, LOS < 2 midnights    Barton Dubois MD [facility] Pager [phone]  If 7PM-7AM, please contact night-coverage [email or web address] Password [email or web address]  01/25/2016, 10:16 AM

## 2016-01-25 NOTE — Progress Notes (Signed)
Patient stated that she is okay with [place] to share any information regarding her condition/plan of care with her daughter, Darrick Huntsman.

## 2016-01-25 NOTE — ED Notes (Signed)
Pt given Sprite for po fluid challenge.

## 2016-01-25 NOTE — Progress Notes (Signed)
PHARMACY NOTE:  ANTIMICROBIAL RENAL DOSAGE ADJUSTMENT  Current antimicrobial regimen includes a mismatch between antimicrobial dosage and estimated renal function.  As per policy approved by the Pharmacy & Therapeutics and Medical Executive Committees, the antimicrobial dosage will be adjusted accordingly.  Current antimicrobial dosage:  Tamiflu 75 mg PO BID  Indication: influenza  Renal Function:  Estimated Creatinine Clearance: 43.9 mL/min (by C-G formula based on SCr of 0.95 mg/dL).     Antimicrobial dosage has been changed to:  Tamiflu 30 mg PO BID  Additional comments:   Thank you for allowing pharmacy to be a part of this patient's care.   Royetta Asal, PharmD, BCPS Pager [phone] 01/25/2016 10:24 AM

## 2016-01-25 NOTE — ED Notes (Signed)
Pt requesting po fluids at this time. Dr.Molpus notified

## 2016-01-25 NOTE — ED Notes (Signed)
After administration of Carafate pt was unable to keep medication down. Dr.Molpus to be notified.

## 2016-01-26 DIAGNOSIS — E860 Dehydration: Secondary | ICD-10-CM | POA: Diagnosis not present

## 2016-01-26 DIAGNOSIS — D72829 Elevated white blood cell count, unspecified: Secondary | ICD-10-CM

## 2016-01-26 DIAGNOSIS — R739 Hyperglycemia, unspecified: Secondary | ICD-10-CM

## 2016-01-26 DIAGNOSIS — E039 Hypothyroidism, unspecified: Secondary | ICD-10-CM

## 2016-01-26 DIAGNOSIS — K210 Gastro-esophageal reflux disease with esophagitis: Secondary | ICD-10-CM

## 2016-01-26 DIAGNOSIS — A084 Viral intestinal infection, unspecified: Principal | ICD-10-CM

## 2016-01-26 DIAGNOSIS — J449 Chronic obstructive pulmonary disease, unspecified: Secondary | ICD-10-CM

## 2016-01-26 DIAGNOSIS — R112 Nausea with vomiting, unspecified: Secondary | ICD-10-CM

## 2016-01-26 DIAGNOSIS — E876 Hypokalemia: Secondary | ICD-10-CM

## 2016-01-26 DIAGNOSIS — R1013 Epigastric pain: Secondary | ICD-10-CM | POA: Diagnosis not present

## 2016-01-26 LAB — CBC
HCT: 29.1 % — ABNORMAL LOW (ref 36.0–46.0)
HEMOGLOBIN: 9.4 g/dL — AB (ref 12.0–15.0)
MCH: 29.7 pg (ref 26.0–34.0)
MCHC: 32.3 g/dL (ref 30.0–36.0)
MCV: 91.8 fL (ref 78.0–100.0)
Platelets: 265 10*3/uL (ref 150–400)
RBC: 3.17 MIL/uL — AB (ref 3.87–5.11)
RDW: 14.7 % (ref 11.5–15.5)
WBC: 8.4 10*3/uL (ref 4.0–10.5)

## 2016-01-26 LAB — BASIC METABOLIC PANEL
ANION GAP: 4 — AB (ref 5–15)
BUN: 6 mg/dL (ref 6–20)
CHLORIDE: 102 mmol/L (ref 101–111)
CO2: 27 mmol/L (ref 22–32)
CREATININE: 0.69 mg/dL (ref 0.44–1.00)
Calcium: 7.9 mg/dL — ABNORMAL LOW (ref 8.9–10.3)
GFR calc non Af Amer: >60 mL/min (ref >60)
Glucose, Bld: 97 mg/dL (ref 65–99)
Potassium: 4.1 mmol/L (ref 3.5–5.1)
Sodium: 133 mmol/L — ABNORMAL LOW (ref 135–145)

## 2016-01-26 LAB — URINE CULTURE

## 2016-01-26 LAB — HEMOGLOBIN A1C
HEMOGLOBIN A1C: 5.1 % (ref 4.8–5.6)
MEAN PLASMA GLUCOSE: 100 mg/dL

## 2016-01-26 MED ORDER — PRAVASTATIN SODIUM 40 MG PO TABS
80.0000 mg | ORAL_TABLET | Freq: Every day | ORAL | Status: DC
  Administered 2016-01-26 – 2016-01-27 (×2): 80 mg via ORAL
  Filled 2016-01-26 (×2): qty 2

## 2016-01-26 MED ORDER — MOMETASONE FURO-FORMOTEROL FUM 200-5 MCG/ACT IN AERO
2.0000 | INHALATION_SPRAY | Freq: Two times a day (BID) | RESPIRATORY_TRACT | Status: DC
  Filled 2016-01-26: qty 8.8

## 2016-01-26 MED ORDER — DIPHENOXYLATE-ATROPINE 2.5-0.025 MG PO TABS
2.0000 | ORAL_TABLET | Freq: Four times a day (QID) | ORAL | Status: DC | PRN
  Administered 2016-01-26: 2 via ORAL
  Filled 2016-01-26: qty 2

## 2016-01-26 MED ORDER — LEVOTHYROXINE SODIUM 88 MCG PO TABS
88.0000 ug | ORAL_TABLET | Freq: Every day | ORAL | Status: DC
  Administered 2016-01-27: 88 ug via ORAL
  Filled 2016-01-26: qty 1

## 2016-01-26 MED ORDER — PANTOPRAZOLE SODIUM 40 MG PO TBEC
40.0000 mg | DELAYED_RELEASE_TABLET | Freq: Every day | ORAL | Status: DC
  Administered 2016-01-26 – 2016-01-27 (×2): 40 mg via ORAL
  Filled 2016-01-26 (×2): qty 1

## 2016-01-26 MED ORDER — LORAZEPAM 0.5 MG PO TABS
0.5000 mg | ORAL_TABLET | Freq: Four times a day (QID) | ORAL | Status: DC | PRN
  Administered 2016-01-26: 0.5 mg via ORAL
  Filled 2016-01-26: qty 1

## 2016-01-26 MED ORDER — LEVOTHYROXINE SODIUM 100 MCG IV SOLR: 25.0000 ug | Freq: Every day | INTRAVENOUS | Status: DC

## 2016-01-26 MED ORDER — TRAMADOL HCL 50 MG PO TABS
50.0000 mg | ORAL_TABLET | Freq: Four times a day (QID) | ORAL | Status: DC | PRN
  Administered 2016-01-26: 50 mg via ORAL
  Filled 2016-01-26: qty 1

## 2016-01-26 MED ORDER — ASPIRIN 81 MG PO CHEW
81.0000 mg | CHEWABLE_TABLET | Freq: Every day | ORAL | Status: DC
  Administered 2016-01-26 – 2016-01-27 (×2): 81 mg via ORAL
  Filled 2016-01-26 (×2): qty 1

## 2016-01-26 NOTE — Progress Notes (Signed)
PROGRESS NOTE    Amy [name]  [id number]  DOB: 1935/05/01  DOA: 01/24/2016 PCP: Tula Nakayama Outpatient Specialists:  Hospital course:  Amy [name] is a 81 y.o. female with PMH significant for hypothyroidism, GERD, anxiety, COPD , GERD with esophageal stricture, HLD and pulmonary nodules; presented to ED complaining of intractable nausea, vomiting and epigastric discomfort. Patient's pain is vague diffuse, non radiated, and worse with vomiting; not relieving factors and 3-4/10 at the worst. Patient endorses generalized fatigue, weakness and chills. No CP, no SOB, no cough, no dysuria, no hematemesis, no melena, no hematochezia or any other complaints.   ED Course: abd x-ray w/o acute abnormalities, signs of dehydration on physical exam, elevated WBC's in the 17.6 and with hypokalemia. In ED she received 1 mgAtivan, 8 mg Zofran, Protonix IV, Carafate 1 g, and 1 L of IV fluids without relief of symptoms. TRH called to place in observation.   Assessment & Plan:   1-intractable nausea and vomiting: appears to be secondary to Viral gastroenteritis. Patient also endorses generalized fatigue, weakness and chills; raising concerns for Flu. -improving, advance diet as tolerated  2-COPD GOLD III: stable currently -will use pulmicort (equivalent to home symbicort) -continue PRN Duoneb  3-Multiple pulmonary nodules -continue outpatient follow up with pulmonary service   4-GERD (gastroesophageal reflux disease) -resume protonix oral now  5-Hypothyroidism: -low TSH -added free T4, Pt says she was recently placed on lower dose levothyroxine 88 mcg daily.  6-Hyperlipidemia -will resume statins when tolerating PO  7-Dehydration -Continue IVF and follow clinical response and capacity to tolerates PO's  8-Leukocytosis -most likely stress demargination from dehydration -improved to normal  9-Hypokalemia -repleting  10-Hyperglycemia -no prior hx of diabetes -A1C  5.1% -no need for correction unless CBG's > 200   DVT prophylaxis: heparin   Code Status: DNR  Family Communication: no family at bedside   Disposition Plan: anticipate discharge home in less than 2 midnights (once able to tolerate PO's and keep herself hydrated) Consults called: none   Admission status: Continue IVFs,  Hopefully can discharge 1/18  Subjective: Pt having a lot of loose stools, but n/v is better and wants to advance diet  Objective: Vitals:   01/25/16 0812 01/25/16 0908 01/25/16 2103 01/26/16 0449  BP: 132/67 129/64 (!) 128/55 (!) 117/55  Pulse: 96 88 87 91  Resp: 12 16 18 18   Temp: 99.7 F (37.6 C) 99.1 F (37.3 C) 99.5 F (37.5 C) 99.9 F (37.7 C)  TempSrc: Oral Oral Oral Oral  SpO2: 92% 93% 95% 93%  Weight:      Height:        Intake/Output Summary (Last 24 hours) at 01/26/16 1318 Last data filed at 01/26/16 1247  Gross per 24 hour  Intake          1643.33 ml  Output                2 ml  Net          1641.33 ml   Filed Weights   01/24/16 2322  Weight: 57.6 kg (127 lb)    Exam:  General exam: awake, alert, NAD Respiratory system: Clear. No increased work of breathing. Cardiovascular system: S1 & S2 heard, RRR. No JVD, murmurs, gallops, clicks or pedal edema. Gastrointestinal system: Abdomen is nondistended, soft and nontender. Normal bowel sounds heard. Central nervous system: Alert and oriented. No focal neurological deficits. Extremities: no cyanosis.  Data Reviewed: Basic Metabolic Panel:  Recent Labs Lab 01/24/16 2315 01/25/16  1054 01/26/16 0632  NA 135  --  133*  K 3.4*  --  4.1  CL 94*  --  102  CO2 32  --  27  GLUCOSE 144*  --  97  BUN 10  --  6  CREATININE 0.95 0.70 0.69  CALCIUM 9.2  --  7.9*  MG  --  1.8  --   PHOS  --  2.7  --    Liver Function Tests:  Recent Labs Lab 01/24/16 2315  AST 28  ALT 19  ALKPHOS 123  BILITOT 0.8  PROT 7.5  ALBUMIN 4.3    Recent Labs Lab 01/24/16 2315  LIPASE 18   No  results for input(s): AMMONIA in the last 168 hours. CBC:  Recent Labs Lab 01/24/16 2315 01/25/16 1054 01/26/16 0632  WBC 17.6* 14.0* 8.4  HGB 11.9* 10.2* 9.4*  HCT 36.7 31.7* 29.1*  MCV 92.0 93.5 91.8  PLT 361 324 265   Cardiac Enzymes: No results for input(s): CKTOTAL, CKMB, CKMBINDEX, TROPONINI in the last 168 hours. CBG (last 3)  No results for input(s): GLUCAP in the last 72 hours. Recent Results (from the past 240 hour(s))  Culture, Urine     Status: Abnormal   Collection Time: 01/25/16  8:24 AM  Result Value Ref Range Status   Specimen Description URINE, RANDOM  Final   Special Requests NONE  Final   Culture MULTIPLE SPECIES PRESENT, SUGGEST RECOLLECTION (A)  Final   Report Status 01/26/2016 FINAL  Final     Studies: Dg Abd Acute W/chest  Result Date: 01/25/2016 CLINICAL DATA:  Nausea vomiting.  Abdominal discomfort. EXAM: DG ABDOMEN ACUTE W/ 1V CHEST COMPARISON:  10/14/2015. FINDINGS: Mediastinum hilar structures are normal. Lungs are clear. No pleural effusion or pneumothorax. Heart size stable. No acute bony abnormality. Surgical clips right upper quadrant and pelvis. No bowel distention . Moderate stool volume. No free air. Degenerative changes thoracic spine and lumbar spine. IMPRESSION: 1. No acute cardiopulmonary disease. 2. No acute intra-abdominal abnormality. No bowel distention. Moderate stool volume. Electronically Signed   By: Marcello Moores  Register   On: 01/25/2016 07:02   Scheduled Meds: . budesonide (PULMICORT) nebulizer solution  0.25 mg Nebulization BID  . chlorhexidine  15 mL Mouth/Throat BID  . heparin  5,000 Units Subcutaneous Q8H  . [START ON 01/27/2016] levothyroxine  88 mcg Oral QAC breakfast  . mirtazapine  15 mg Oral QHS  . pantoprazole  40 mg Oral Daily  . sucralfate  1 g Oral Q8H   Continuous Infusions: . 0.9 % NaCl with KCl 40 mEq / L 80 mL/hr (01/25/16 1135)    Principal Problem:   Viral gastroenteritis Active Problems:   COPD GOLD III    Multiple pulmonary nodules   GERD (gastroesophageal reflux disease)   Hypothyroidism   Hyperlipidemia   Dehydration   Leukocytosis   Hypokalemia   Hyperglycemia   Intractable nausea and vomiting   Epigastric pain  Time spent:   Irwin Brakeman, MD, FAAFP [facility] Pager [phone]  If 7PM-7AM, please contact night-coverage [email or web address] Password [email or web address] 01/26/2016, 1:18 PM    LOS: 0 days

## 2016-01-27 DIAGNOSIS — R1013 Epigastric pain: Secondary | ICD-10-CM | POA: Diagnosis not present

## 2016-01-27 DIAGNOSIS — J449 Chronic obstructive pulmonary disease, unspecified: Secondary | ICD-10-CM | POA: Diagnosis not present

## 2016-01-27 DIAGNOSIS — E860 Dehydration: Secondary | ICD-10-CM | POA: Diagnosis not present

## 2016-01-27 DIAGNOSIS — A084 Viral intestinal infection, unspecified: Secondary | ICD-10-CM | POA: Diagnosis not present

## 2016-01-27 LAB — COMPREHENSIVE METABOLIC PANEL
ALK PHOS: 76 U/L (ref 38–126)
ALT: 13 U/L — ABNORMAL LOW (ref 14–54)
ANION GAP: 5 (ref 5–15)
AST: 26 U/L (ref 15–41)
Albumin: 3.2 g/dL — ABNORMAL LOW (ref 3.5–5.0)
BILIRUBIN TOTAL: 0.8 mg/dL (ref 0.3–1.2)
BUN: 5 mg/dL — ABNORMAL LOW (ref 6–20)
CALCIUM: 8.2 mg/dL — AB (ref 8.9–10.3)
CO2: 26 mmol/L (ref 22–32)
Chloride: 102 mmol/L (ref 101–111)
Creatinine, Ser: 0.63 mg/dL (ref 0.44–1.00)
GFR calc non Af Amer: >60 mL/min (ref >60)
Glucose, Bld: 97 mg/dL (ref 65–99)
Potassium: 4.1 mmol/L (ref 3.5–5.1)
Sodium: 133 mmol/L — ABNORMAL LOW (ref 135–145)
TOTAL PROTEIN: 6.3 g/dL — AB (ref 6.5–8.1)

## 2016-01-27 LAB — CBC WITH DIFFERENTIAL/PLATELET
Basophils Absolute: 0 10*3/uL (ref 0.0–0.1)
Basophils Relative: 0 %
EOS ABS: 0.3 10*3/uL (ref 0.0–0.7)
Eosinophils Relative: 3 %
HCT: 30.3 % — ABNORMAL LOW (ref 36.0–46.0)
HEMOGLOBIN: 10 g/dL — AB (ref 12.0–15.0)
LYMPHS ABS: 4.3 10*3/uL — AB (ref 0.7–4.0)
Lymphocytes Relative: 41 %
MCH: 30.8 pg (ref 26.0–34.0)
MCHC: 33 g/dL (ref 30.0–36.0)
MCV: 93.2 fL (ref 78.0–100.0)
MONOS PCT: 12 %
Monocytes Absolute: 1.3 10*3/uL — ABNORMAL HIGH (ref 0.1–1.0)
NEUTROS ABS: 4.7 10*3/uL (ref 1.7–7.7)
NEUTROS PCT: 44 %
Platelets: 292 10*3/uL (ref 150–400)
RBC: 3.25 MIL/uL — ABNORMAL LOW (ref 3.87–5.11)
RDW: 14.3 % (ref 11.5–15.5)
WBC: 10.6 10*3/uL — ABNORMAL HIGH (ref 4.0–10.5)

## 2016-01-27 LAB — T4, FREE: Free T4: 1.11 ng/dL (ref 0.61–1.12)

## 2016-01-27 MED ORDER — LEVOTHYROXINE SODIUM 88 MCG PO TABS: 88.0000 ug | ORAL_TABLET | Freq: Every day | ORAL | Status: DC

## 2016-01-27 NOTE — Discharge Summary (Signed)
Physician Discharge Summary  Amy [name] [id number] DOB: 1935-09-17 DOA: 01/24/2016  PCP: Tula Nakayama  Admit date: 01/24/2016 Discharge date: 01/27/2016  Admitted From: Home  Disposition: Home   Recommendations for Outpatient Follow-up:  1. Follow up with PCP in 1-2 weeks  Discharge Condition: STABLE CODE STATUS:  DNR  Brief/Interim Summary: Amy [name] a 81 y.o.femalewith PMH significant for hypothyroidism, GERD, anxiety, COPD , GERD with esophageal stricture, HLD and pulmonary nodules; presented to ED complaining of intractable nausea, vomiting and epigastric discomfort. Patient's pain is vague diffuse, non radiated, and worse with vomiting; not relieving factors and 3-4/10 at the worst. Patient endorses generalized fatigue, weakness and chills. No CP, no SOB, no cough, no dysuria, no hematemesis, no melena, no hematochezia or any other complaints.   ED Course:abd x-ray w/o acute abnormalities, signs of dehydration on physical exam, elevated WBC's in the 17.6 and with hypokalemia. In ED she received 1 mgAtivan, 8 mg Zofran, Protonix IV, Carafate 1 g, and 1 L of IV fluids without relief of symptoms. TRH called to place in observation.   1-intractable nausea and vomiting: appears to be secondary to Viral gastroenteritis. Patient also endorses generalized fatigue, weakness and chills; raising concerns for Flu. -improving, advance diet as tolerated  2-COPD GOLD III: stable currently -will use pulmicort (equivalent to home symbicort) -continue PRN Duoneb  3-Multiple pulmonary nodules -continue outpatient follow up with pulmonary service   4-GERD (gastroesophageal reflux disease)  5-Hypothyroidism: -low TSH -added free T4, Pt says she was recently placed on lower dose levothyroxine 88 mcg daily.  Follow up with PCP to discuss.   6-Hyperlipidemia -will resume home statin  7-Dehydration -Improved with IVFs and now can tolerates  PO's  8-Leukocytosis -most likely stress demargination from dehydration -improved to normal  9-Hypokalemia -repleted  10-Hyperglycemia -no prior hx of diabetes -A1C 5.1%  DVT prophylaxis:heparin  Code Status:DNR Family Communication:no family at bedside   Discharge Diagnoses:  Principal Problem:   Viral gastroenteritis Active Problems:   COPD GOLD III   Multiple pulmonary nodules   GERD (gastroesophageal reflux disease)   Hypothyroidism   Hyperlipidemia   Dehydration   Leukocytosis   Hypokalemia   Hyperglycemia   Intractable nausea and vomiting   Epigastric pain  Discharge Instructions   Allergies as of 01/27/2016      Reactions   Codeine    hallucination      Medication List    STOP taking these medications   ondansetron 4 MG disintegrating tablet Commonly known as:  ZOFRAN ODT     TAKE these medications   ALPRAZolam 0.25 MG tablet Commonly known as:  XANAX Take 1 tablet by mouth as needed for anxiety or sleep.   aspirin 81 MG tablet Take 81 mg by mouth daily.   budesonide-formoterol 160-4.5 MCG/ACT inhaler Commonly known as:  SYMBICORT Inhale 2 puffs into the lungs 2 (two) times daily as needed (shortness of breath).   levothyroxine 88 MCG tablet Commonly known as:  SYNTHROID, LEVOTHROID Take 1 tablet (88 mcg total) by mouth daily. What changed:  medication strength  how much to take   mirtazapine 15 MG tablet Commonly known as:  REMERON Take 15 mg by mouth at bedtime.   omeprazole 40 MG capsule Commonly known as:  PRILOSEC Take 40 mg by mouth at bedtime.   pravastatin 80 MG tablet Commonly known as:  PRAVACHOL Take 80 mg by mouth daily.   traMADol 50 MG tablet Commonly known as:  ULTRAM Take 50 mg by mouth  every 6 (six) hours as needed for moderate pain or severe pain.   Vitamin D3 2000 units Tabs Take 2,000 Units by mouth daily.      Follow-up Information    KAPLAN,KRISTEN, PA-C. Schedule an appointment as soon  as possible for a visit in 2 week(s).   Specialty:  Family Medicine Why:  Hospital Follow Up  Contact information: [id number] [place] [address] [phone]          Allergies  Allergen Reactions  . Codeine     hallucination    Procedures/Studies: Dg Abd Acute W/chest  Result Date: 01/25/2016 CLINICAL DATA:  Nausea vomiting.  Abdominal discomfort. EXAM: DG ABDOMEN ACUTE W/ 1V CHEST COMPARISON:  10/14/2015. FINDINGS: Mediastinum hilar structures are normal. Lungs are clear. No pleural effusion or pneumothorax. Heart size stable. No acute bony abnormality. Surgical clips right upper quadrant and pelvis. No bowel distention . Moderate stool volume. No free air. Degenerative changes thoracic spine and lumbar spine. IMPRESSION: 1. No acute cardiopulmonary disease. 2. No acute intra-abdominal abnormality. No bowel distention. Moderate stool volume. Electronically Signed   By: Marcello Moores  Register   On: 01/25/2016 07:02     Subjective: Pt eating and drinking well today.  No n/v/d.   Discharge Exam: Vitals:   01/26/16 2247 01/27/16 0554  BP: 126/62 (!) 107/53  Pulse: 85 78  Resp: 16 16  Temp: 99 F (37.2 C) 98.1 F (36.7 C)   Vitals:   01/26/16 0449 01/26/16 1447 01/26/16 2247 01/27/16 0554  BP: (!) 117/55 (!) 151/73 126/62 (!) 107/53  Pulse: 91 86 85 78  Resp: 18 18 16 16   Temp: 99.9 F (37.7 C) 98.4 F (36.9 C) 99 F (37.2 C) 98.1 F (36.7 C)  TempSrc: Oral Oral Oral Oral  SpO2: 93% 97% 94% 92%  Weight:      Height:        General: Pt is alert, awake, not in acute distress Cardiovascular: RRR, S1/S2 +, no rubs, no gallops Respiratory: CTA bilaterally, no wheezing, no rhonchi Abdominal: Soft, NT, ND, bowel sounds + Extremities: no edema, no cyanosis  The results of significant diagnostics from this hospitalization (including imaging, microbiology, ancillary and laboratory) are listed below for reference.     Microbiology: Recent Results (from the past  240 hour(s))  Culture, Urine     Status: Abnormal   Collection Time: 01/25/16  8:24 AM  Result Value Ref Range Status   Specimen Description URINE, RANDOM  Final   Special Requests NONE  Final   Culture MULTIPLE SPECIES PRESENT, SUGGEST RECOLLECTION (A)  Final   Report Status 01/26/2016 FINAL  Final     Labs: BNP (last 3 results) No results for input(s): BNP in the last 8760 hours. Basic Metabolic Panel:  Recent Labs Lab 01/24/16 2315 01/25/16 1054 01/26/16 0632 01/27/16 0605  NA 135  --  133* 133*  K 3.4*  --  4.1 4.1  CL 94*  --  102 102  CO2 32  --  27 26  GLUCOSE 144*  --  97 97  BUN 10  --  6 5*  CREATININE 0.95 0.70 0.69 0.63  CALCIUM 9.2  --  7.9* 8.2*  MG  --  1.8  --   --   PHOS  --  2.7  --   --    Liver Function Tests:  Recent Labs Lab 01/24/16 2315 01/27/16 0605  AST 28 26  ALT 19 13*  ALKPHOS 123 76  BILITOT 0.8  0.8  PROT 7.5 6.3*  ALBUMIN 4.3 3.2*    Recent Labs Lab 01/24/16 2315  LIPASE 18   No results for input(s): AMMONIA in the last 168 hours. CBC:  Recent Labs Lab 01/24/16 2315 01/25/16 1054 01/26/16 0632 01/27/16 0605  WBC 17.6* 14.0* 8.4 10.6*  NEUTROABS  --   --   --  4.7  HGB 11.9* 10.2* 9.4* 10.0*  HCT 36.7 31.7* 29.1* 30.3*  MCV 92.0 93.5 91.8 93.2  PLT 361 324 265 292   Cardiac Enzymes: No results for input(s): CKTOTAL, CKMB, CKMBINDEX, TROPONINI in the last 168 hours. BNP: Invalid input(s): POCBNP CBG: No results for input(s): GLUCAP in the last 168 hours. D-Dimer No results for input(s): DDIMER in the last 72 hours. Hgb A1c  Recent Labs  01/25/16 1054  HGBA1C 5.1   Lipid Profile No results for input(s): CHOL, HDL, LDLCALC, TRIG, CHOLHDL, LDLDIRECT in the last 72 hours. Thyroid function studies  Recent Labs  01/25/16 1054  TSH 0.204*   Anemia work up No results for input(s): VITAMINB12, FOLATE, FERRITIN, TIBC, IRON, RETICCTPCT in the last 72 hours. Urinalysis    Component Value Date/Time    COLORURINE YELLOW 01/25/2016 [place] 01/25/2016 0824   LABSPEC 1.016 01/25/2016 0824   PHURINE 7.0 01/25/2016 0824   GLUCOSEU NEGATIVE 01/25/2016 0824   HGBUR SMALL (A) 01/25/2016 0824   BILIRUBINUR NEGATIVE 01/25/2016 0824   KETONESUR 20 (A) 01/25/2016 0824   PROTEINUR NEGATIVE 01/25/2016 0824   UROBILINOGEN 0.2 12/14/2012 0541   NITRITE NEGATIVE 01/25/2016 0824   LEUKOCYTESUR LARGE (A) 01/25/2016 0824   Sepsis Labs Invalid input(s): PROCALCITONIN,  WBC,  LACTICIDVEN Microbiology Recent Results (from the past 240 hour(s))  Culture, Urine     Status: Abnormal   Collection Time: 01/25/16  8:24 AM  Result Value Ref Range Status   Specimen Description URINE, RANDOM  Final   Special Requests NONE  Final   Culture MULTIPLE SPECIES PRESENT, SUGGEST RECOLLECTION (A)  Final   Report Status 01/26/2016 FINAL  Final   Time coordinating discharge: 26 mins  SIGNED:  Irwin Brakeman, MD  [facility] 01/27/2016, 10:38 AM Pager   If 7PM-7AM, please contact night-coverage [email or web address] Password [email or web address]

## 2016-02-26 ENCOUNTER — Emergency Department (HOSPITAL_COMMUNITY): Payer: Medicare Other

## 2016-02-26 ENCOUNTER — Encounter (HOSPITAL_COMMUNITY): Payer: Self-pay | Admitting: Emergency Medicine

## 2016-02-26 ENCOUNTER — Emergency Department (HOSPITAL_COMMUNITY)
Admission: EM | Admit: 2016-02-26 | Discharge: 2016-02-26 | Disposition: A | Discharge status: Home / Self Care | Payer: Medicare Other | Attending: Emergency Medicine | Admitting: Emergency Medicine

## 2016-02-26 DIAGNOSIS — Z7982 Long term (current) use of aspirin: Secondary | ICD-10-CM | POA: Insufficient documentation

## 2016-02-26 DIAGNOSIS — J449 Chronic obstructive pulmonary disease, unspecified: Secondary | ICD-10-CM | POA: Diagnosis not present

## 2016-02-26 DIAGNOSIS — R109 Unspecified abdominal pain: Secondary | ICD-10-CM

## 2016-02-26 DIAGNOSIS — E039 Hypothyroidism, unspecified: Secondary | ICD-10-CM | POA: Insufficient documentation

## 2016-02-26 DIAGNOSIS — Z87891 Personal history of nicotine dependence: Secondary | ICD-10-CM | POA: Diagnosis not present

## 2016-02-26 DIAGNOSIS — Z79899 Other long term (current) drug therapy: Secondary | ICD-10-CM | POA: Insufficient documentation

## 2016-02-26 LAB — CBC WITH DIFFERENTIAL/PLATELET
BASOS ABS: 0 10*3/uL (ref 0.0–0.1)
BASOS PCT: 0 %
EOS PCT: 3 %
Eosinophils Absolute: 0.2 10*3/uL (ref 0.0–0.7)
HCT: 33.4 % — ABNORMAL LOW (ref 36.0–46.0)
Hemoglobin: 10.5 g/dL — ABNORMAL LOW (ref 12.0–15.0)
Lymphocytes Relative: 36 %
Lymphs Abs: 2.6 10*3/uL (ref 0.7–4.0)
MCH: 28.8 pg (ref 26.0–34.0)
MCHC: 31.4 g/dL (ref 30.0–36.0)
MCV: 91.5 fL (ref 78.0–100.0)
MONO ABS: 0.7 10*3/uL (ref 0.1–1.0)
Monocytes Relative: 9 %
Neutro Abs: 3.8 10*3/uL (ref 1.7–7.7)
Neutrophils Relative %: 52 %
PLATELETS: 359 10*3/uL (ref 150–400)
RBC: 3.65 MIL/uL — ABNORMAL LOW (ref 3.87–5.11)
RDW: 13.9 % (ref 11.5–15.5)
WBC: 7.4 10*3/uL (ref 4.0–10.5)

## 2016-02-26 LAB — I-STAT TROPONIN, ED: TROPONIN I, POC: 0 ng/mL (ref 0.00–0.08)

## 2016-02-26 LAB — URINALYSIS, MICROSCOPIC (REFLEX)

## 2016-02-26 LAB — URINALYSIS, ROUTINE W REFLEX MICROSCOPIC
BILIRUBIN URINE: NEGATIVE
Glucose, UA: NEGATIVE mg/dL
Ketones, ur: NEGATIVE mg/dL
NITRITE: NEGATIVE
Protein, ur: 30 mg/dL — AB
pH: 7.5 (ref 5.0–8.0)

## 2016-02-26 LAB — COMPREHENSIVE METABOLIC PANEL
ALBUMIN: 3.5 g/dL (ref 3.5–5.0)
ALT: 18 U/L (ref 14–54)
AST: 24 U/L (ref 15–41)
Alkaline Phosphatase: 107 U/L (ref 38–126)
Anion gap: 6 (ref 5–15)
BUN: 8 mg/dL (ref 6–20)
CHLORIDE: 104 mmol/L (ref 101–111)
CO2: 28 mmol/L (ref 22–32)
Calcium: 9.2 mg/dL (ref 8.9–10.3)
Creatinine, Ser: 0.64 mg/dL (ref 0.44–1.00)
GFR calc Af Amer: >60 mL/min (ref >60)
GFR calc non Af Amer: >60 mL/min (ref >60)
GLUCOSE: 112 mg/dL — AB (ref 65–99)
POTASSIUM: 4 mmol/L (ref 3.5–5.1)
SODIUM: 138 mmol/L (ref 135–145)
Total Bilirubin: 0.6 mg/dL (ref 0.3–1.2)
Total Protein: 7.1 g/dL (ref 6.5–8.1)

## 2016-02-26 LAB — I-STAT CG4 LACTIC ACID, ED: LACTIC ACID, VENOUS: 0.95 mmol/L (ref 0.5–1.9)

## 2016-02-26 LAB — INFLUENZA PANEL BY PCR (TYPE A & B)
Influenza A By PCR: NEGATIVE
Influenza B By PCR: NEGATIVE

## 2016-02-26 LAB — LIPASE, BLOOD: LIPASE: 18 U/L (ref 11–51)

## 2016-02-26 MED ORDER — SODIUM CHLORIDE 0.9 % IV BOLUS (SEPSIS)
1000.0000 mL | Freq: Once | INTRAVENOUS | Status: AC
  Administered 2016-02-26: 1000 mL via INTRAVENOUS

## 2016-02-26 MED ORDER — ONDANSETRON HCL 4 MG PO TABS: 4.0000 mg | ORAL_TABLET | Freq: Four times a day (QID) | ORAL | 0 refills | Status: DC

## 2016-02-26 MED ORDER — IOPAMIDOL (ISOVUE-300) INJECTION 61%
INTRAVENOUS | Status: AC
  Administered 2016-02-26: 75 mL via INTRAVENOUS
  Filled 2016-02-26: qty 30

## 2016-02-26 MED ORDER — IOPAMIDOL (ISOVUE-300) INJECTION 61%
INTRAVENOUS | Status: AC
  Administered 2016-02-26: 30 mL via ORAL
  Filled 2016-02-26: qty 75

## 2016-02-26 MED ORDER — GI COCKTAIL ~~LOC~~
30.0000 mL | Freq: Once | ORAL | Status: AC
  Administered 2016-02-26: 30 mL via ORAL
  Filled 2016-02-26: qty 30

## 2016-02-26 MED ORDER — SODIUM CHLORIDE 0.9 % IJ SOLN
INTRAMUSCULAR | Status: AC
  Administered 2016-02-26: 18:00:00
  Filled 2016-02-26: qty 50

## 2016-02-26 NOTE — ED Provider Notes (Signed)
  Physical Exam  Pulse 90   Temp 98.2 F (36.8 C) (Oral)   Resp 17   SpO2 97%   Physical Exam  ED Course  Procedures  MDM 3:07 PM- Sign out from Jeannett Senior, PA-C  Per previous provider MDM- Patient in emergency department with nausea, vomiting, diarrhea, onset this morning, generalized weakness for a week. Sounds like she had normal 3 meals yesterday with no problems eating. She is in no acute distress, vital signs are normal at this time. Do not suspect sepsis. She does have dry oral mucosa, asking for something to drink. Will give ice chips, IV fluids started, will check labs acute abdomen, urinalysis.  Labs unremarkable. Negative lactic. No leukocytosis. Flu negative.    Pending CT Abdomen/Pelvis. If unremarkable, anticipate DC home if able to tolerate PO.   6:16 PM- CT Abdomen/Pelvis:  The stomach and duodenum are distended with decompressed mid and distal small bowel. There appears to be gradual taper from distended duodenum to normal caliber small bowel. Ileus and/or early obstruction not excluded.  Intrahepatic and extrahepatic biliary ductal dilatation likely physiologic given prior cholecystectomy. Obstruction is not entirely excluded. Recommend correlation with LFTs.  Circumferential wall thickening of the distal esophagus which may be secondary to esophagitis. Recommend clinical correlation.    Pt tolerating fluids well. Discussed possible admission for early SBO, but patient prefers to go home and attempt clear liquid diet with close follow up to PCP on Monday. Discussed risks and benefits with patient. Given strict return precautions. Discussed with attending physician. Plan is to DC home with follow up. Given Rx Zofran.      Shary Decamp, PA-C 02/26/16 [place], MD 02/27/16 [phone]

## 2016-02-26 NOTE — ED Triage Notes (Signed)
Patient here from home with complaints of nausea, vomiting. Reports being diagnosed with "stomach bug" 1 week ago and havent recovered since. 4mg  Zofran given in route.

## 2016-02-26 NOTE — ED Provider Notes (Signed)
[facility] DEPT Provider Note   CSN: [id number] Arrival date & time: 02/26/16  1303     History   Chief Complaint Chief Complaint  Patient presents with  . Nausea  . Emesis    HPI Amy [name] is a 81 y.o. female.  HPI Amy [name] is a 81 y.o. female with hx of anxiety, COPD, esophageal stricture, GERD, paroxsymal afib, presents to ED with nausea, vomiting, diarrhea, generalized malaise and weakness. Pt recently hospitalized for gastroenteritis 1 month ago, states felt better and did well for few weeks. States symptoms began again 1 week ago. Reports generalized abdominal discomfort. Persistent nausea, that worsened today with 1 episode of nausea and diarrhea. States she ate normally yesterday. States feels like she could be dehydrated, reports dry mouth. States "thinking about food makes me want to throat up now." Deneis urinary symptoms. No chest pain. No sob. No back pain. Pt received 4mg  of zofran by EMS. Pt also reports pain in her throat, states "feels like something is swelling there." no difficulty swallowing.   Past Medical History:  Diagnosis Date  . Anxiety   . Arthritis   . COPD (chronic obstructive pulmonary disease) ([place])   . Esophageal stricture    s/p repeated dilations, Dr. Watt Climes  . GERD (gastroesophageal reflux disease)   . Hiatal hernia   . Hyperlipidemia   . Hypothyroidism   . Osteoarthritis   . Osteoporosis   . Paroxysmal atrial fibrillation ([facility])   . Pulmonary nodules   . Renal lesion    1cm left kidney    Patient Active Problem List   Diagnosis Date Noted  . Viral gastroenteritis 01/25/2016  . Dehydration 01/25/2016  . Leukocytosis 01/25/2016  . Hypokalemia 01/25/2016  . Hyperglycemia 01/25/2016  . Intractable nausea and vomiting 01/25/2016  . Epigastric pain   . Normocytic anemia 12/12/2012  . GERD (gastroesophageal reflux disease)   . Hypothyroidism   . Arthritis   . Renal lesion   . Osteoarthritis   . Osteoporosis   . COPD  (chronic obstructive pulmonary disease) ([place])   . Esophageal stricture   . Pulmonary nodules   . Anxiety   . Hyperlipidemia   . Biliary colic [id number]  . Multiple pulmonary nodules 04/24/2012  . COPD GOLD III 04/23/2012  . Abdominal pain, unspecified site 04/23/2012  . Medial meniscus tear 02/22/2011    Past Surgical History:  Procedure Laterality Date  . ABDOMINAL HYSTERECTOMY    . BREAST ENHANCEMENT SURGERY  2006  . CATARACT EXTRACTION W/ INTRAOCULAR LENS  IMPLANT, BILATERAL  '09  . ESOPHAGEAL DILATION    . KNEE ARTHROSCOPY  02/22/2011   Procedure: ARTHROSCOPY KNEE;  Surgeon: Gearlean Alf, MD;  Location: [facility];  Service: Orthopedics;  Laterality: Right;  WITH DEBRIDEMENT     OB History    No data available       Home Medications    Prior to Admission medications   Medication Sig Start Date End Date Taking? Authorizing Provider  ALPRAZolam Duanne Moron) 0.25 MG tablet Take 1 tablet by mouth as needed for anxiety or sleep.  03/26/12   Historical Provider, MD  aspirin 81 MG tablet Take 81 mg by mouth daily.    Historical Provider, MD  budesonide-formoterol (SYMBICORT) 160-4.5 MCG/ACT inhaler Inhale 2 puffs into the lungs 2 (two) times daily as needed (shortness of breath). 05/30/12   Tanda Rockers, MD  Cholecalciferol (VITAMIN D3) 2000 UNITS TABS Take 2,000 Units by mouth daily.    Historical  Provider, MD  levothyroxine (SYNTHROID, LEVOTHROID) 88 MCG tablet Take 1 tablet (88 mcg total) by mouth daily. 01/27/16   Clanford Marisa Hua, MD  mirtazapine (REMERON) 15 MG tablet Take 15 mg by mouth at bedtime. 10/17/13   Historical Provider, MD  omeprazole (PRILOSEC) 40 MG capsule Take 40 mg by mouth at bedtime. 05/12/13   Historical Provider, MD  pravastatin (PRAVACHOL) 80 MG tablet Take 80 mg by mouth daily.    Historical Provider, MD  traMADol (ULTRAM) 50 MG tablet Take 50 mg by mouth every 6 (six) hours as needed for moderate pain or severe pain.     Historical  Provider, MD    Family History Family History  Problem Relation Age of Onset  . Heart disease Father   . Rheum arthritis Father   . Heart failure Mother   . Diabetes Brother     Social History Social History  Substance Use Topics  . Smoking status: Former Smoker    Packs/day: 1.00    Years: 30.00    Types: Cigarettes    Quit date: 01/09/1997  . Smokeless tobacco: Never Used  . Alcohol use No     Allergies   Codeine   Review of Systems Review of Systems  Constitutional: Positive for appetite change and fatigue. Negative for chills and fever.  HENT: Positive for sore throat.   Respiratory: Negative for cough, chest tightness and shortness of breath.   Cardiovascular: Negative for chest pain, palpitations and leg swelling.  Gastrointestinal: Positive for abdominal pain, diarrhea, nausea and vomiting.  Genitourinary: Negative for dysuria, flank pain, pelvic pain, vaginal bleeding, vaginal discharge and vaginal pain.  Musculoskeletal: Negative for arthralgias, myalgias, neck pain and neck stiffness.  Skin: Negative for rash.  Neurological: Positive for weakness. Negative for dizziness and headaches.  All other systems reviewed and are negative.    Physical Exam Updated Vital Signs Pulse 90   Temp 98.2 F (36.8 C) (Oral)   Resp 17   SpO2 97%   Physical Exam  Constitutional: She is oriented to person, place, and time. She appears well-developed and well-nourished. No distress.  HENT:  Head: Normocephalic.  Eyes: Conjunctivae are normal.  Neck: Neck supple.  Cardiovascular: Normal rate, regular rhythm and normal heart sounds.   Pulmonary/Chest: Effort normal and breath sounds normal. No respiratory distress. She has no wheezes. She has no rales.  Abdominal: Soft. Bowel sounds are normal. She exhibits no distension. There is tenderness. There is no rebound.  llq tenderness. No guarding  Musculoskeletal: She exhibits no edema.  Neurological: She is alert and  oriented to person, place, and time.  Skin: Skin is warm and dry.  Psychiatric: She has a normal mood and affect. Her behavior is normal.  Nursing note and vitals reviewed.    ED Treatments / Results  Labs (all labs ordered are listed, but only abnormal results are displayed) Labs Reviewed  CBC WITH DIFFERENTIAL/PLATELET  COMPREHENSIVE METABOLIC PANEL  LIPASE, BLOOD  INFLUENZA PANEL BY PCR (TYPE A & B)  URINALYSIS, ROUTINE W REFLEX MICROSCOPIC  I-STAT CG4 LACTIC ACID, ED  I-STAT TROPOININ, ED    EKG  EKG Interpretation None       Radiology No results found.  Procedures Procedures (including critical care time)  Medications Ordered in ED Medications  sodium chloride 0.9 % bolus 1,000 mL (not administered)     Initial Impression / Assessment and Plan / ED Course  I have reviewed the triage vital signs and the nursing notes.  Pertinent  labs & imaging results that were available during my care of the patient were reviewed by me and considered in my medical decision making (see chart for details).     Patient in emergency department with nausea, vomiting, diarrhea, onset this morning, generalized weakness for a week. Sounds like she had normal 3 meals yesterday with no problems eating. She is in no acute distress, vital signs are normal at this time. Do not suspect sepsis. She does have dry oral mucosa, asking for something to drink. Will give ice chips, IV fluids started, will check labs acute abdomen, urinalysis.  3:09 PM Blood work with no significant abnormalities. UA showing rare  Bacteria, 6-30 WBCs wit some contamination. Will send cultures. Influenza negative.   CT abd/pelvis ordered for further evaluation.   Signed out at shift change to PA [facility], pending CT abd/pelvis  Final Clinical Impressions(s) / ED Diagnoses   Final diagnoses:  Abdominal pain, unspecified abdominal location    New Prescriptions    Jeannett Senior, PA-C 02/27/16 [place], MD 03/03/16 [phone]

## 2016-02-26 NOTE — Discharge Instructions (Signed)
Please read and follow all provided instructions.  Your diagnoses today include:  1. Abdominal pain, unspecified abdominal location     Tests performed today include: Vital signs. See below for your results today.   Medications prescribed:  Take as prescribed   Home care instructions:  Follow any educational materials contained in this packet.  Follow-up instructions: Please follow-up with your primary care provider on Monday for further evaluation of symptoms and treatment   Return instructions:  Please return to the Emergency Department if you do not get better, if you get worse, or new symptoms OR  - Fever (temperature greater than 101.63F)  - Bleeding that does not stop with holding pressure to the area    -Severe pain (please note that you may be more sore the day after your accident)  - Chest Pain  - Difficulty breathing  - Severe nausea or vomiting  - Inability to tolerate food and liquids  - Passing out  - Skin becoming red around your wounds  - Change in mental status (confusion or lethargy)  - New numbness or weakness    Please return if you have any other emergent concerns.  Additional Information:  Your vital signs today were: BP 161/88    Pulse 85    Temp 98.2 F (36.8 C) (Oral)    Resp 16    SpO2 97%  If your blood pressure (BP) was elevated above 135/85 this visit, please have this repeated by your doctor within one month. ---------------

## 2016-02-26 NOTE — ED Notes (Signed)
For fluid challenge: pt has tolerated 8oz spirit, no N/V, pt reported tolerated it well.

## 2016-02-26 NOTE — ED Notes (Signed)
Bed: [id number] Expected date: 02/26/16 Expected time: 1:06 PM Means of arrival: Ambulance Comments: lethargic

## 2016-02-28 LAB — URINE CULTURE

## 2016-03-22 ENCOUNTER — Emergency Department (HOSPITAL_COMMUNITY): Payer: Medicare Other

## 2016-03-22 ENCOUNTER — Inpatient Hospital Stay (HOSPITAL_COMMUNITY)
Admission: EM | Admit: 2016-03-22 | Discharge: 2016-03-26 | DRG: 871 | Disposition: A | Discharge status: Home / Self Care | Payer: Medicare Other | Attending: Internal Medicine | Admitting: Internal Medicine

## 2016-03-22 ENCOUNTER — Encounter (HOSPITAL_COMMUNITY): Payer: Self-pay

## 2016-03-22 DIAGNOSIS — F419 Anxiety disorder, unspecified: Secondary | ICD-10-CM | POA: Diagnosis present

## 2016-03-22 DIAGNOSIS — Z79899 Other long term (current) drug therapy: Secondary | ICD-10-CM | POA: Diagnosis not present

## 2016-03-22 DIAGNOSIS — M810 Age-related osteoporosis without current pathological fracture: Secondary | ICD-10-CM | POA: Diagnosis present

## 2016-03-22 DIAGNOSIS — E876 Hypokalemia: Secondary | ICD-10-CM | POA: Diagnosis present

## 2016-03-22 DIAGNOSIS — I4892 Unspecified atrial flutter: Secondary | ICD-10-CM | POA: Diagnosis present

## 2016-03-22 DIAGNOSIS — M1990 Unspecified osteoarthritis, unspecified site: Secondary | ICD-10-CM | POA: Diagnosis present

## 2016-03-22 DIAGNOSIS — J9601 Acute respiratory failure with hypoxia: Secondary | ICD-10-CM | POA: Diagnosis present

## 2016-03-22 DIAGNOSIS — I480 Paroxysmal atrial fibrillation: Secondary | ICD-10-CM | POA: Diagnosis present

## 2016-03-22 DIAGNOSIS — Y95 Nosocomial condition: Secondary | ICD-10-CM | POA: Diagnosis present

## 2016-03-22 DIAGNOSIS — K210 Gastro-esophageal reflux disease with esophagitis: Secondary | ICD-10-CM | POA: Diagnosis present

## 2016-03-22 DIAGNOSIS — Z87891 Personal history of nicotine dependence: Secondary | ICD-10-CM | POA: Diagnosis not present

## 2016-03-22 DIAGNOSIS — I4891 Unspecified atrial fibrillation: Secondary | ICD-10-CM

## 2016-03-22 DIAGNOSIS — J690 Pneumonitis due to inhalation of food and vomit: Secondary | ICD-10-CM | POA: Diagnosis present

## 2016-03-22 DIAGNOSIS — Z833 Family history of diabetes mellitus: Secondary | ICD-10-CM | POA: Diagnosis not present

## 2016-03-22 DIAGNOSIS — K222 Esophageal obstruction: Secondary | ICD-10-CM | POA: Diagnosis present

## 2016-03-22 DIAGNOSIS — Z66 Do not resuscitate: Secondary | ICD-10-CM | POA: Diagnosis present

## 2016-03-22 DIAGNOSIS — A419 Sepsis, unspecified organism: Principal | ICD-10-CM | POA: Diagnosis present

## 2016-03-22 DIAGNOSIS — E039 Hypothyroidism, unspecified: Secondary | ICD-10-CM | POA: Diagnosis present

## 2016-03-22 DIAGNOSIS — Z8249 Family history of ischemic heart disease and other diseases of the circulatory system: Secondary | ICD-10-CM

## 2016-03-22 DIAGNOSIS — J181 Lobar pneumonia, unspecified organism: Secondary | ICD-10-CM | POA: Diagnosis present

## 2016-03-22 DIAGNOSIS — J440 Chronic obstructive pulmonary disease with acute lower respiratory infection: Secondary | ICD-10-CM | POA: Diagnosis present

## 2016-03-22 DIAGNOSIS — Z885 Allergy status to narcotic agent status: Secondary | ICD-10-CM

## 2016-03-22 DIAGNOSIS — J189 Pneumonia, unspecified organism: Secondary | ICD-10-CM | POA: Diagnosis present

## 2016-03-22 DIAGNOSIS — D649 Anemia, unspecified: Secondary | ICD-10-CM | POA: Diagnosis present

## 2016-03-22 DIAGNOSIS — Z7982 Long term (current) use of aspirin: Secondary | ICD-10-CM

## 2016-03-22 DIAGNOSIS — E785 Hyperlipidemia, unspecified: Secondary | ICD-10-CM | POA: Diagnosis present

## 2016-03-22 DIAGNOSIS — Z8679 Personal history of other diseases of the circulatory system: Secondary | ICD-10-CM

## 2016-03-22 LAB — COMPREHENSIVE METABOLIC PANEL
ALT: 20 U/L (ref 14–54)
ANION GAP: 7 (ref 5–15)
AST: 33 U/L (ref 15–41)
Albumin: 3.5 g/dL (ref 3.5–5.0)
Alkaline Phosphatase: 129 U/L — ABNORMAL HIGH (ref 38–126)
BILIRUBIN TOTAL: 0.6 mg/dL (ref 0.3–1.2)
BUN: 7 mg/dL (ref 6–20)
CHLORIDE: 105 mmol/L (ref 101–111)
CO2: 28 mmol/L (ref 22–32)
Calcium: 8.7 mg/dL — ABNORMAL LOW (ref 8.9–10.3)
Creatinine, Ser: 0.78 mg/dL (ref 0.44–1.00)
GFR calc Af Amer: >60 mL/min (ref >60)
Glucose, Bld: 126 mg/dL — ABNORMAL HIGH (ref 65–99)
Potassium: 3.3 mmol/L — ABNORMAL LOW (ref 3.5–5.1)
Sodium: 140 mmol/L (ref 135–145)
TOTAL PROTEIN: 7.2 g/dL (ref 6.5–8.1)

## 2016-03-22 LAB — CBC WITH DIFFERENTIAL/PLATELET
BASOS ABS: 0 10*3/uL (ref 0.0–0.1)
Basophils Relative: 0 %
Eosinophils Absolute: 0 10*3/uL (ref 0.0–0.7)
Eosinophils Relative: 0 %
HCT: 32.2 % — ABNORMAL LOW (ref 36.0–46.0)
HEMOGLOBIN: 10.5 g/dL — AB (ref 12.0–15.0)
LYMPHS ABS: 1.3 10*3/uL (ref 0.7–4.0)
LYMPHS PCT: 9 %
MCH: 29.6 pg (ref 26.0–34.0)
MCHC: 32.6 g/dL (ref 30.0–36.0)
MCV: 90.7 fL (ref 78.0–100.0)
Monocytes Absolute: 1.2 10*3/uL — ABNORMAL HIGH (ref 0.1–1.0)
Monocytes Relative: 9 %
NEUTROS ABS: 11 10*3/uL — AB (ref 1.7–7.7)
NEUTROS PCT: 82 %
PLATELETS: 359 10*3/uL (ref 150–400)
RBC: 3.55 MIL/uL — AB (ref 3.87–5.11)
RDW: 14.5 % (ref 11.5–15.5)
WBC: 13.4 10*3/uL — AB (ref 4.0–10.5)

## 2016-03-22 LAB — I-STAT CG4 LACTIC ACID, ED
LACTIC ACID, VENOUS: 0.84 mmol/L (ref 0.5–1.9)
LACTIC ACID, VENOUS: 1.11 mmol/L (ref 0.5–1.9)

## 2016-03-22 LAB — STREP PNEUMONIAE URINARY ANTIGEN: Strep Pneumo Urinary Antigen: NEGATIVE

## 2016-03-22 LAB — INFLUENZA PANEL BY PCR (TYPE A & B)
INFLAPCR: NEGATIVE
Influenza B By PCR: NEGATIVE

## 2016-03-22 LAB — URINALYSIS, ROUTINE W REFLEX MICROSCOPIC
Bilirubin Urine: NEGATIVE
GLUCOSE, UA: NEGATIVE mg/dL
KETONES UR: NEGATIVE mg/dL
LEUKOCYTES UA: NEGATIVE
Nitrite: NEGATIVE
PROTEIN: NEGATIVE mg/dL
SQUAMOUS EPITHELIAL / LPF: NONE SEEN
Specific Gravity, Urine: 1.004 — ABNORMAL LOW (ref 1.005–1.030)
pH: 6 (ref 5.0–8.0)

## 2016-03-22 LAB — PROTIME-INR
INR: 1.03
Prothrombin Time: 13.5 seconds (ref 11.4–15.2)

## 2016-03-22 MED ORDER — TRAMADOL HCL 50 MG PO TABS
50.0000 mg | ORAL_TABLET | Freq: Four times a day (QID) | ORAL | Status: DC | PRN
  Administered 2016-03-22 – 2016-03-25 (×4): 50 mg via ORAL
  Filled 2016-03-22 (×4): qty 1

## 2016-03-22 MED ORDER — DEXTROSE 5 % IV SOLN
1.0000 g | INTRAVENOUS | Status: DC
  Filled 2016-03-22: qty 10

## 2016-03-22 MED ORDER — SODIUM CHLORIDE 0.9 % IV SOLN
INTRAVENOUS | Status: AC
  Administered 2016-03-22: 17:00:00 via INTRAVENOUS

## 2016-03-22 MED ORDER — SODIUM CHLORIDE 0.9 % IV BOLUS (SEPSIS)
500.0000 mL | Freq: Once | INTRAVENOUS | Status: AC
  Administered 2016-03-22: 500 mL via INTRAVENOUS

## 2016-03-22 MED ORDER — AZITHROMYCIN 500 MG IV SOLR
500.0000 mg | Freq: Once | INTRAVENOUS | Status: AC
  Administered 2016-03-22: 500 mg via INTRAVENOUS
  Filled 2016-03-22: qty 500

## 2016-03-22 MED ORDER — LEVOTHYROXINE SODIUM 88 MCG PO TABS
88.0000 ug | ORAL_TABLET | Freq: Every day | ORAL | Status: DC
  Administered 2016-03-22 – 2016-03-24 (×2): 88 ug via ORAL
  Filled 2016-03-22 (×2): qty 1

## 2016-03-22 MED ORDER — DEXTROSE 5 % IV SOLN
500.0000 mg | INTRAVENOUS | Status: DC
  Filled 2016-03-22: qty 500

## 2016-03-22 MED ORDER — SODIUM CHLORIDE 0.9 % IV BOLUS (SEPSIS)
250.0000 mL | Freq: Once | INTRAVENOUS | Status: AC
Start: 2016-03-22 — End: 2016-03-22
  Administered 2016-03-22: 250 mL via INTRAVENOUS

## 2016-03-22 MED ORDER — ALPRAZOLAM 0.25 MG PO TABS
0.2500 mg | ORAL_TABLET | Freq: Every day | ORAL | Status: DC | PRN
  Administered 2016-03-22 – 2016-03-25 (×4): 0.25 mg via ORAL
  Filled 2016-03-22 (×4): qty 1

## 2016-03-22 MED ORDER — IPRATROPIUM-ALBUTEROL 0.5-2.5 (3) MG/3ML IN SOLN: 3.0000 mL | Freq: Four times a day (QID) | RESPIRATORY_TRACT | Status: DC | PRN

## 2016-03-22 MED ORDER — MOMETASONE FURO-FORMOTEROL FUM 200-5 MCG/ACT IN AERO
2.0000 | INHALATION_SPRAY | Freq: Two times a day (BID) | RESPIRATORY_TRACT | Status: DC
  Administered 2016-03-22 – 2016-03-26 (×8): 2 via RESPIRATORY_TRACT
  Filled 2016-03-22: qty 8.8

## 2016-03-22 MED ORDER — DEXTROSE 5 % IV SOLN
1.0000 g | Freq: Once | INTRAVENOUS | Status: AC
  Administered 2016-03-22: 1 g via INTRAVENOUS
  Filled 2016-03-22: qty 10

## 2016-03-22 MED ORDER — ACETAMINOPHEN 500 MG PO TABS
1000.0000 mg | ORAL_TABLET | Freq: Once | ORAL | Status: AC
  Administered 2016-03-22: 1000 mg via ORAL
  Filled 2016-03-22: qty 2

## 2016-03-22 MED ORDER — FLUTICASONE PROPIONATE 50 MCG/ACT NA SUSP
1.0000 | Freq: Every day | NASAL | Status: DC | PRN
  Administered 2016-03-24: 2 via NASAL
  Administered 2016-03-25: 1 via NASAL
  Filled 2016-03-22: qty 16

## 2016-03-22 MED ORDER — ENOXAPARIN SODIUM 40 MG/0.4ML ~~LOC~~ SOLN
40.0000 mg | SUBCUTANEOUS | Status: DC
  Administered 2016-03-22 – 2016-03-25 (×4): 40 mg via SUBCUTANEOUS
  Filled 2016-03-22 (×5): qty 0.4

## 2016-03-22 MED ORDER — ASPIRIN EC 81 MG PO TBEC
81.0000 mg | DELAYED_RELEASE_TABLET | Freq: Every day | ORAL | Status: DC
  Administered 2016-03-22 – 2016-03-25 (×4): 81 mg via ORAL
  Filled 2016-03-22 (×4): qty 1

## 2016-03-22 MED ORDER — SODIUM CHLORIDE 0.9 % IV BOLUS (SEPSIS)
1000.0000 mL | Freq: Once | INTRAVENOUS | Status: AC
  Administered 2016-03-22: 1000 mL via INTRAVENOUS

## 2016-03-22 MED ORDER — POTASSIUM CHLORIDE CRYS ER 20 MEQ PO TBCR
40.0000 meq | EXTENDED_RELEASE_TABLET | Freq: Once | ORAL | Status: AC
  Administered 2016-03-22: 40 meq via ORAL
  Filled 2016-03-22: qty 2

## 2016-03-22 MED ORDER — MIRTAZAPINE 15 MG PO TABS
7.5000 mg | ORAL_TABLET | Freq: Every day | ORAL | Status: DC
  Administered 2016-03-22 – 2016-03-25 (×4): 7.5 mg via ORAL
  Filled 2016-03-22 (×4): qty 1

## 2016-03-22 MED ORDER — IPRATROPIUM-ALBUTEROL 0.5-2.5 (3) MG/3ML IN SOLN
3.0000 mL | Freq: Once | RESPIRATORY_TRACT | Status: AC
  Administered 2016-03-22: 3 mL via RESPIRATORY_TRACT
  Filled 2016-03-22: qty 3

## 2016-03-22 MED ORDER — PANTOPRAZOLE SODIUM 40 MG PO TBEC
40.0000 mg | DELAYED_RELEASE_TABLET | Freq: Every day | ORAL | Status: DC
  Administered 2016-03-22 – 2016-03-25 (×4): 40 mg via ORAL
  Filled 2016-03-22 (×4): qty 1

## 2016-03-22 MED ORDER — ATORVASTATIN CALCIUM 10 MG PO TABS
20.0000 mg | ORAL_TABLET | Freq: Every day | ORAL | Status: DC
  Administered 2016-03-22 – 2016-03-25 (×4): 20 mg via ORAL
  Filled 2016-03-22 (×4): qty 2

## 2016-03-22 NOTE — ED Provider Notes (Signed)
[facility] DEPT Provider Note   CSN: [id number] Arrival date & time: 03/22/16  1031     History   Chief Complaint Chief Complaint  Patient presents with  . Fever  . Sore Throat    HPI Amy [name] is a 81 y.o. female.  HPI Patient reports that she started getting a little ill last night. This morning she awakened and felt like she had had reflux in her throat. She had a burning sensation. She went on to vomit several times. Fever of 102 was identified. She reports she does feel achy and weak all over. She reports last time she had illness like this she had "double pneumonia". She has had a slight amount of cough. She reports she's been getting sick on and off for a couple of months and has been to the emergency department several times. Last ED visit 2\17 vomiting and diarrhea with consideration for early SBO, and last admission 1\15\2018. The admission diagnosis was viral gastroenteritis Past Medical History:  Diagnosis Date  . Anxiety   . Arthritis   . COPD (chronic obstructive pulmonary disease) ([place])   . Esophageal stricture    s/p repeated dilations, Dr. Watt Climes  . GERD (gastroesophageal reflux disease)   . Hiatal hernia   . Hyperlipidemia   . Hypothyroidism   . Osteoarthritis   . Osteoporosis   . Paroxysmal atrial fibrillation (HCC)   . Pulmonary nodules   . Renal lesion    1cm left kidney    Patient Active Problem List   Diagnosis Date Noted  . CAP (community acquired pneumonia) 03/22/2016  . Viral gastroenteritis 01/25/2016  . Dehydration 01/25/2016  . Leukocytosis 01/25/2016  . Hypokalemia 01/25/2016  . Hyperglycemia 01/25/2016  . Intractable nausea and vomiting 01/25/2016  . Epigastric pain   . Normocytic anemia 12/12/2012  . GERD (gastroesophageal reflux disease)   . Hypothyroidism   . Arthritis   . Renal lesion   . Osteoarthritis   . Osteoporosis   . COPD (chronic obstructive pulmonary disease) ([place])   . Esophageal stricture   . Pulmonary  nodules   . Anxiety   . Hyperlipidemia   . Biliary colic 63/01/6008  . Multiple pulmonary nodules 04/24/2012  . COPD GOLD III 04/23/2012  . Abdominal pain, unspecified site 04/23/2012  . Medial meniscus tear 02/22/2011    Past Surgical History:  Procedure Laterality Date  . ABDOMINAL HYSTERECTOMY    . BREAST ENHANCEMENT SURGERY  2006  . CATARACT EXTRACTION W/ INTRAOCULAR LENS  IMPLANT, BILATERAL  '09  . ESOPHAGEAL DILATION    . KNEE ARTHROSCOPY  02/22/2011   Procedure: ARTHROSCOPY KNEE;  Surgeon: Gearlean Alf, MD;  Location: [facility];  Service: Orthopedics;  Laterality: Right;  WITH DEBRIDEMENT     OB History    No data available       Home Medications    Prior to Admission medications   Medication Sig Start Date End Date Taking? Authorizing Provider  ALPRAZolam Duanne Moron) 0.25 MG tablet Take 1 tablet by mouth daily as needed for anxiety or sleep.  03/26/12  Yes Historical Provider, MD  aspirin EC 81 MG tablet Take 81 mg by mouth at bedtime.   Yes Historical Provider, MD  atorvastatin (LIPITOR) 20 MG tablet Take 20 mg by mouth at bedtime. 01/31/16  Yes Historical Provider, MD  budesonide-formoterol (SYMBICORT) 160-4.5 MCG/ACT inhaler Inhale 2 puffs into the lungs 2 (two) times daily as needed (shortness of breath). 05/30/12  Yes Tanda Rockers, MD  Cholecalciferol (VITAMIN D3) 2000 UNITS TABS Take 2,000 Units by mouth at bedtime.    Yes Historical Provider, MD  fluticasone (FLONASE) 50 MCG/ACT nasal spray Place 1-2 sprays into both nostrils daily as needed for allergies. 02/04/16  Yes Historical Provider, MD  ibuprofen (ADVIL,MOTRIN) 200 MG tablet Take 400 mg by mouth every 4 (four) hours as needed for fever, headache, mild pain, moderate pain or cramping.   Yes Historical Provider, MD  levothyroxine (SYNTHROID, LEVOTHROID) 88 MCG tablet Take 1 tablet (88 mcg total) by mouth daily. Patient taking differently: Take 88 mcg by mouth at bedtime.  01/27/16  Yes Cl[place]  Marisa Hua, MD  mirtazapine (REMERON) 15 MG tablet Take 7.5 mg by mouth at bedtime.  10/17/13  Yes Historical Provider, MD  ondansetron (ZOFRAN) 4 MG tablet Take 1 tablet (4 mg total) by mouth every 6 (six) hours. Patient taking differently: Take 4 mg by mouth every 8 (eight) hours as needed for nausea or vomiting.  02/26/16  Yes Shary Decamp, PA-C  pantoprazole (PROTONIX) 40 MG tablet Take 40 mg by mouth at bedtime. 02/02/16  Yes Historical Provider, MD  traMADol (ULTRAM) 50 MG tablet Take 50 mg by mouth every 6 (six) hours as needed for moderate pain or severe pain.    Yes Historical Provider, MD    Family History Family History  Problem Relation Age of Onset  . Heart disease Father   . Rheum arthritis Father   . Heart failure Mother   . Diabetes Brother     Social History Social History  Substance Use Topics  . Smoking status: Former Smoker    Packs/day: 1.00    Years: 30.00    Types: Cigarettes    Quit date: 01/09/1997  . Smokeless tobacco: Never Used  . Alcohol use No     Allergies   Codeine   Review of Systems Review of Systems 10 Systems reviewed and are negative for acute change except as noted in the HPI.   Physical Exam Updated Vital Signs BP 125/62   Pulse 120   Temp 102.9 F (39.4 C) (Oral)   Resp 19   Ht 5\' 5"  (1.651 m)   Wt 121 lb (54.9 kg)   SpO2 95%   BMI 20.14 kg/m   Physical Exam  Constitutional: She is oriented to person, place, and time.  He is alert and nontoxic. She does not have respiratory distress at rest. Very thin.  HENT:  Head: Normocephalic and atraumatic.  Posterior airway widely patent. No tonsillar erythema or exudate. Mucous memories pink and moist.  Eyes: EOM are normal. Pupils are equal, round, and reactive to light.  Neck: Neck supple.  Cardiovascular:  Tachycardia. No gross rub murmur gallop.  Pulmonary/Chest: Effort normal.  Rales right upper and mid lung field. X-ray wheeze. Air flow the left base.  Abdominal: Soft. She  exhibits no distension. There is no tenderness. There is no guarding.  Musculoskeletal: Normal range of motion. She exhibits no edema, tenderness or deformity.  Neurological: She is alert and oriented to person, place, and time. No cranial nerve deficit. She exhibits normal muscle tone. Coordination normal.  Skin: Skin is warm and dry.  Psychiatric: She has a normal mood and affect.     ED Treatments / Results  Labs (all labs ordered are listed, but only abnormal results are displayed) Labs Reviewed  COMPREHENSIVE METABOLIC PANEL - Abnormal; Notable for the following:       Result Value   Potassium 3.3 (*)  Glucose, Bld 126 (*)    Calcium 8.7 (*)    Alkaline Phosphatase 129 (*)    All other components within normal limits  CBC WITH DIFFERENTIAL/PLATELET - Abnormal; Notable for the following:    WBC 13.4 (*)    RBC 3.55 (*)    Hemoglobin 10.5 (*)    HCT 32.2 (*)    Neutro Abs 11.0 (*)    Monocytes Absolute 1.2 (*)    All other components within normal limits  URINALYSIS, ROUTINE W REFLEX MICROSCOPIC - Abnormal; Notable for the following:    Color, Urine STRAW (*)    Specific Gravity, Urine 1.004 (*)    Hgb urine dipstick SMALL (*)    Bacteria, UA RARE (*)    All other components within normal limits  CULTURE, BLOOD (ROUTINE X 2)  CULTURE, BLOOD (ROUTINE X 2)  URINE CULTURE  PROTIME-INR  INFLUENZA PANEL BY PCR (TYPE A & B)  I-STAT CG4 LACTIC ACID, ED  I-STAT CG4 LACTIC ACID, ED  I-STAT CG4 LACTIC ACID, ED    EKG  EKG Interpretation  Date/Time:  Wednesday March 22 2016 12:04:37 EDT Ventricular Rate:  101 PR Interval:    QRS Duration: 95 QT Interval:  349 QTC Calculation: 453 R Axis:   94 Text Interpretation:  Sinus tachycardia Probable left atrial enlargement Right axis deviation Borderline T wave abnormalities Baseline wander in lead(s) I III aVR aVL no acte ischemic changes. no sigchange from previous Confirmed by Johnney Killian, MD, Jeannie Done [phone]) on 03/22/2016 2:19:59  PM       Radiology Dg Chest 2 View  Result Date: 03/22/2016 CLINICAL DATA:  Fevers and cough EXAM: CHEST  2 VIEW COMPARISON:  02/26/2016 FINDINGS: Cardiac shadow is within normal limits. The lungs are hyperinflated consistent with COPD. Patchy infiltrative changes noted throughout the right lung. This predominately is located within the upper lobe on the lateral projection. No sizable effusion is seen. No bony abnormality is noted. Breast implants are seen. IMPRESSION: Predominately right upper lobe infiltrate. Electronically Signed   By: Inez Catalina M.D.   On: 03/22/2016 11:39    Procedures Procedures (including critical care time) CRITICAL CARE Performed by: Charlesetta Shanks   Total critical care time:30 minutes  Critical care time was exclusive of separately billable procedures and treating other patients.  Critical care was necessary to treat or prevent imminent or life-threatening deterioration.  Critical care was time spent personally by me on the following activities: development of treatment plan with patient and/or surrogate as well as nursing, discussions with consultants, evaluation of patient's response to treatment, examination of patient, obtaining history from patient or surrogate, ordering and performing treatments and interventions, ordering and review of laboratory studies, ordering and review of radiographic studies, pulse oximetry and re-evaluation of patient's condition. Medications Ordered in ED Medications  acetaminophen (TYLENOL) tablet 1,000 mg (not administered)  ipratropium-albuterol (DUONEB) 0.5-2.5 (3) MG/3ML nebulizer solution 3 mL (not administered)  sodium chloride 0.9 % bolus 1,000 mL (0 mLs Intravenous Stopped 03/22/16 1214)    And  sodium chloride 0.9 % bolus 500 mL (500 mLs Intravenous New Bag/Given 03/22/16 1210)    And  sodium chloride 0.9 % bolus 250 mL (0 mLs Intravenous Stopped 03/22/16 1218)  cefTRIAXone (ROCEPHIN) 1 g in dextrose 5 % 50 mL IVPB  (1 g Intravenous New Bag/Given 03/22/16 1303)  azithromycin (ZITHROMAX) 500 mg in dextrose 5 % 250 mL IVPB (500 mg Intravenous New Bag/Given 03/22/16 1304)     Initial Impression / Assessment and Plan /  ED Course  I have reviewed the triage vital signs and the nursing notes.  Pertinent labs & imaging results that were available during my care of the patient were reviewed by me and considered in my medical decision making (see chart for details).    Consult: [facility] hospitalist for admission  Final Clinical Impressions(s) / ED Diagnoses   Final diagnoses:  Community acquired pneumonia of right upper lobe of lung ([place])  Sepsis, due to unspecified organism [facility])  Patient has symptoms started fairly acutely yesterday and this morning. Findings are consistent with pneumonia. She is alert and appropriate. She does not have respiratory distress. She does however have fever, tachycardia, leukocytosis and focus of infection. Sepsis protocol initiated.  New Prescriptions New Prescriptions   No medications on file     Charlesetta Shanks, MD 03/22/16 1439

## 2016-03-22 NOTE — ED Triage Notes (Signed)
Pt here with fever starting today.  Pt went to MD on Monday and told her she had elevated WBC.  Pt had sore throat this morning and thinks related to Reflux.  No n/v.  No abdominal pain. Does have dark urine.

## 2016-03-22 NOTE — H&P (Signed)
History and Physical    Amy [name]:[id number] DOB: Dec 26, 1935 DOA: 03/22/2016  PCP: Tula Nakayama  Patient coming from: Home.  I have personally briefly reviewed patient's old medical records in [place]  Chief Complaint: sob  And fever.  HPI: Amy [name] is a 81 y.o. female with medical history significant of hypothyroidism, COPD, PAF, presents with symptoms of reflux , cough and some sob. On arrival to ED she was found to be febrile, and labs revealed elevated WBC count of 13.4, potassium of 3.3, lactic acid  Of 0.84, cxr shows right upper lobe infiltrate. She denies any chest pain, nausea, vomiting or abdominal pain, syncope, headache, blurry vision, tingling or numbness. She denies any other complaints. She was referred to medical service for management of community acquired pneumonia.   Review of Systems: As per HPI otherwise 10 point review of systems negative.    Past Medical History:  Diagnosis Date  . Anxiety   . Arthritis   . COPD (chronic obstructive pulmonary disease) ([place])   . Esophageal stricture    s/p repeated dilations, Dr. Watt Climes  . GERD (gastroesophageal reflux disease)   . Hiatal hernia   . Hyperlipidemia   . Hypothyroidism   . Osteoarthritis   . Osteoporosis   . Paroxysmal atrial fibrillation (H[facility])   . Pulmonary nodules   . Renal lesion    1cm left kidney    Past Surgical History:  Procedure Laterality Date  . ABDOMINAL HYSTERECTOMY    . BREAST ENHANCEMENT SURGERY  2006  . CATARACT EXTRACTION W/ INTRAOCULAR LENS  IMPLANT, BILATERAL  '09  . ESOPHAGEAL DILATION    . KNEE ARTHROSCOPY  02/22/2011   Procedure: ARTHROSCOPY KNEE;  Surgeon: Gearlean Alf, MD;  Location: [facility];  Service: Orthopedics;  Laterality: Right;  WITH DEBRIDEMENT      reports that she quit smoking about 19 years ago. Her smoking use included Cigarettes. She has a 30.00 pack-year smoking history. She has never used smokeless tobacco. She  reports that she does not drink alcohol or use drugs.  Allergies  Allergen Reactions  . Codeine Other (See Comments)    hallucination    Family History  Problem Relation Age of Onset  . Heart disease Father   . Rheum arthritis Father   . Heart failure Mother   . Diabetes Brother    Reviewed   Prior to Admission medications   Medication Sig Start Date End Date Taking? Authorizing Provider  ALPRAZolam Duanne Moron) 0.25 MG tablet Take 1 tablet by mouth daily as needed for anxiety or sleep.  03/26/12  Yes Historical Provider, MD  aspirin EC 81 MG tablet Take 81 mg by mouth at bedtime.   Yes Historical Provider, MD  atorvastatin (LIPITOR) 20 MG tablet Take 20 mg by mouth at bedtime. 01/31/16  Yes Historical Provider, MD  budesonide-formoterol (SYMBICORT) 160-4.5 MCG/ACT inhaler Inhale 2 puffs into the lungs 2 (two) times daily as needed (shortness of breath). 05/30/12  Yes Tanda Rockers, MD  Cholecalciferol (VITAMIN D3) 2000 UNITS TABS Take 2,000 Units by mouth at bedtime.    Yes Historical Provider, MD  fluticasone (FLONASE) 50 MCG/ACT nasal spray Place 1-2 sprays into both nostrils daily as needed for allergies. 02/04/16  Yes Historical Provider, MD  ibuprofen (ADVIL,MOTRIN) 200 MG tablet Take 400 mg by mouth every 4 (four) hours as needed for fever, headache, mild pain, moderate pain or cramping.   Yes Historical Provider, MD  levothyroxine (SYNTHROID, LEVOTHROID) 88  MCG tablet Take 1 tablet (88 mcg total) by mouth daily. Patient taking differently: Take 88 mcg by mouth at bedtime.  01/27/16  Yes Cl[place] Marisa Hua, MD  mirtazapine (REMERON) 15 MG tablet Take 7.5 mg by mouth at bedtime.  10/17/13  Yes Historical Provider, MD  ondansetron (ZOFRAN) 4 MG tablet Take 1 tablet (4 mg total) by mouth every 6 (six) hours. Patient taking differently: Take 4 mg by mouth every 8 (eight) hours as needed for nausea or vomiting.  02/26/16  Yes Shary Decamp, PA-C  pantoprazole (PROTONIX) 40 MG tablet Take 40 mg by  mouth at bedtime. 02/02/16  Yes Historical Provider, MD  traMADol (ULTRAM) 50 MG tablet Take 50 mg by mouth every 6 (six) hours as needed for moderate pain or severe pain.    Yes Historical Provider, MD    Physical Exam: Vitals:   03/22/16 1430 03/22/16 1447 03/22/16 1500 03/22/16 1550  BP: 115/56  (!) 117/51 (!) 117/45  Pulse: 109  108 (!) 105  Resp: 18  18 16   Temp:    99.5 F (37.5 C)  TempSrc:    Oral  SpO2: 96% 95% 96% 96%  Weight:    55 kg (121 lb 3.2 oz)  Height:    5\' 5"  (1.651 m)    Constitutional: NAD, calm, comfortable Vitals:   03/22/16 1430 03/22/16 1447 03/22/16 1500 03/22/16 1550  BP: 115/56  (!) 117/51 (!) 117/45  Pulse: 109  108 (!) 105  Resp: 18  18 16   Temp:    99.5 F (37.5 C)  TempSrc:    Oral  SpO2: 96% 95% 96% 96%  Weight:    55 kg (121 lb 3.2 oz)  Height:    5\' 5"  (1.651 m)   Eyes: PERRL, lids and conjunctivae normal ENMT: Mucous membranes are moist. Posterior pharynx clear of any exudate or lesions.Normal dentition.  Neck: normal, supple, no masses, no thyromegaly Respiratory: clear to auscultation bilaterally, no wheezing, no crackles. Normal respiratory effort. No accessory muscle use.  Cardiovascular: Regular rate and rhythm, no murmurs / rubs / gallops. No extremity edema. 2+ pedal pulses. No carotid bruits.  Abdomen: no tenderness, no masses palpated. No hepatosplenomegaly. Bowel sounds positive.  Musculoskeletal: no clubbing / cyanosis. No joint deformity upper and lower extremities. Good ROM, no contractures. Normal muscle tone.  Skin: no rashes, lesions, ulcers. No induration Neurologic: CN 2-12 grossly intact. Sensation intact, DTR normal. Strength 5/5 in all 4.  Psychiatric: Normal judgment and insight. Alert and oriented x 3. Normal mood.   Labs on Admission: I have personally reviewed following labs and imaging studies  CBC:  Recent Labs Lab 03/22/16 1143  WBC 13.4*  NEUTROABS 11.0*  HGB 10.5*  HCT 32.2*  MCV 90.7  PLT 979    Basic Metabolic Panel:  Recent Labs Lab 03/22/16 1143  NA 140  K 3.3*  CL 105  CO2 28  GLUCOSE 126*  BUN 7  CREATININE 0.78  CALCIUM 8.7*   GFR: Estimated Creatinine Clearance: 49.5 mL/min (by C-G formula based on SCr of 0.78 mg/dL). Liver Function Tests:  Recent Labs Lab 03/22/16 1143  AST 33  ALT 20  ALKPHOS 129*  BILITOT 0.6  PROT 7.2  ALBUMIN 3.5   No results for input(s): LIPASE, AMYLASE in the last 168 hours. No results for input(s): AMMONIA in the last 168 hours. Coagulation Profile:  Recent Labs Lab 03/22/16 1143  INR 1.03   Cardiac Enzymes: No results for input(s): CKTOTAL, CKMB, CKMBINDEX, TROPONINI  in the last 168 hours. BNP (last 3 results) No results for input(s): PROBNP in the last 8760 hours. HbA1C: No results for input(s): HGBA1C in the last 72 hours. CBG: No results for input(s): GLUCAP in the last 168 hours. Lipid Profile: No results for input(s): CHOL, HDL, LDLCALC, TRIG, CHOLHDL, LDLDIRECT in the last 72 hours. Thyroid Function Tests: No results for input(s): TSH, T4TOTAL, FREET4, T3FREE, THYROIDAB in the last 72 hours. Anemia Panel: No results for input(s): VITAMINB12, FOLATE, FERRITIN, TIBC, IRON, RETICCTPCT in the last 72 hours. Urine analysis:    Component Value Date/Time   COLORURINE STRAW (A) 03/22/2016 1143   APPEARANCEUR CLEAR 03/22/2016 1143   LABSPEC 1.004 (L) 03/22/2016 1143   PHURINE 6.0 03/22/2016 1143   GLUCOSEU NEGATIVE 03/22/2016 1143   HGBUR SMALL (A) 03/22/2016 1143   BILIRUBINUR NEGATIVE 03/22/2016 1143   KETONESUR NEGATIVE 03/22/2016 1143   PROTEINUR NEGATIVE 03/22/2016 1143   UROBILINOGEN 0.2 12/14/2012 0541   NITRITE NEGATIVE 03/22/2016 1143   LEUKOCYTESUR NEGATIVE 03/22/2016 1143    Radiological Exams on Admission: Dg Chest 2 View  Result Date: 03/22/2016 CLINICAL DATA:  Fevers and cough EXAM: CHEST  2 VIEW COMPARISON:  02/26/2016 FINDINGS: Cardiac shadow is within normal limits. The lungs are  hyperinflated consistent with COPD. Patchy infiltrative changes noted throughout the right lung. This predominately is located within the upper lobe on the lateral projection. No sizable effusion is seen. No bony abnormality is noted. Breast implants are seen. IMPRESSION: Predominately right upper lobe infiltrate. Electronically Signed   By: Inez Catalina M.D.   On: 03/22/2016 11:39    EKG: Independently reviewed. Sinus tachycardia, borderline t wave abnormalities.   Assessment/Plan Active Problems:   CAP (community acquired pneumonia)   Pneumonia  Acute respiratory failure with hypoxia secondary to community acquired pneumonia:  Admit to telemetry , and start on IV rocephin and zithromax.  Blood cultures and sputum cultures ordered. [place] oxygen as needed to keep sats greater than 90%.  In view of the reflux symptoms, start protonix and pepcid, and get SLP evaluation for dysphagia and aspiration.     COPD: no wheezing heard, resume dulera and duonebs.    Hypothyroidism:  Get TSH, resume synthroid.   Normocytic anemia:  Hemoglobin at 10.5 . Monitor.    Hypokalemia Replete as needed.       DVT prophylaxis:  Lovenox.  Code Status: =DNR Family Communication: none at bedside.  Disposition Plan:pending further eval. Consults called: none.  Admission status: \inpatientConcha Pyo MD [facility] Pager [phone]  If 7PM-7AM, please contact night-coverage [email or web address] Password [facility]  03/22/2016, 5:31 PM

## 2016-03-22 NOTE — ED Notes (Signed)
Lactic acid resulted at 1153 to be 0.84, MD made aware. Result never crossed to Epic. [facility] POC staff made aware as well.

## 2016-03-23 ENCOUNTER — Encounter (HOSPITAL_COMMUNITY): Payer: Self-pay | Admitting: Internal Medicine

## 2016-03-23 DIAGNOSIS — I480 Paroxysmal atrial fibrillation: Secondary | ICD-10-CM | POA: Diagnosis present

## 2016-03-23 DIAGNOSIS — J181 Lobar pneumonia, unspecified organism: Secondary | ICD-10-CM

## 2016-03-23 DIAGNOSIS — J690 Pneumonitis due to inhalation of food and vomit: Secondary | ICD-10-CM

## 2016-03-23 DIAGNOSIS — J9601 Acute respiratory failure with hypoxia: Secondary | ICD-10-CM

## 2016-03-23 DIAGNOSIS — A419 Sepsis, unspecified organism: Principal | ICD-10-CM

## 2016-03-23 LAB — CBC
HCT: 27.1 % — ABNORMAL LOW (ref 36.0–46.0)
HEMOGLOBIN: 8.6 g/dL — AB (ref 12.0–15.0)
MCH: 29.1 pg (ref 26.0–34.0)
MCHC: 31.7 g/dL (ref 30.0–36.0)
MCV: 91.6 fL (ref 78.0–100.0)
PLATELETS: 305 10*3/uL (ref 150–400)
RBC: 2.96 MIL/uL — AB (ref 3.87–5.11)
RDW: 14.9 % (ref 11.5–15.5)
WBC: 19.8 10*3/uL — ABNORMAL HIGH (ref 4.0–10.5)

## 2016-03-23 LAB — HIV ANTIBODY (ROUTINE TESTING W REFLEX): HIV SCREEN 4TH GENERATION: NONREACTIVE

## 2016-03-23 LAB — MRSA PCR SCREENING: MRSA BY PCR: NEGATIVE

## 2016-03-23 LAB — BASIC METABOLIC PANEL
ANION GAP: 4 — AB (ref 5–15)
BUN: 7 mg/dL (ref 6–20)
CHLORIDE: 108 mmol/L (ref 101–111)
CO2: 28 mmol/L (ref 22–32)
Calcium: 7.7 mg/dL — ABNORMAL LOW (ref 8.9–10.3)
Creatinine, Ser: 0.63 mg/dL (ref 0.44–1.00)
GFR calc Af Amer: >60 mL/min (ref >60)
GLUCOSE: 105 mg/dL — AB (ref 65–99)
POTASSIUM: 3.8 mmol/L (ref 3.5–5.1)
Sodium: 140 mmol/L (ref 135–145)

## 2016-03-23 LAB — TSH: TSH: 0.048 u[IU]/mL — ABNORMAL LOW (ref 0.350–4.500)

## 2016-03-23 LAB — URINE CULTURE: Culture: NO GROWTH

## 2016-03-23 MED ORDER — ONDANSETRON HCL 4 MG/2ML IJ SOLN
4.0000 mg | Freq: Four times a day (QID) | INTRAMUSCULAR | Status: DC | PRN
  Administered 2016-03-23 – 2016-03-25 (×2): 4 mg via INTRAVENOUS
  Filled 2016-03-23 (×2): qty 2

## 2016-03-23 MED ORDER — OXYCODONE-ACETAMINOPHEN 5-325 MG PO TABS: 1.0000 | ORAL_TABLET | Freq: Once | ORAL | Status: DC

## 2016-03-23 MED ORDER — GUAIFENESIN-DM 100-10 MG/5ML PO SYRP
5.0000 mL | ORAL_SOLUTION | ORAL | Status: DC | PRN
  Administered 2016-03-23 – 2016-03-25 (×3): 5 mL via ORAL
  Filled 2016-03-23 (×3): qty 10

## 2016-03-23 MED ORDER — IPRATROPIUM-ALBUTEROL 0.5-2.5 (3) MG/3ML IN SOLN
3.0000 mL | Freq: Two times a day (BID) | RESPIRATORY_TRACT | Status: DC
  Administered 2016-03-23 – 2016-03-25 (×6): 3 mL via RESPIRATORY_TRACT
  Filled 2016-03-23 (×6): qty 3

## 2016-03-23 MED ORDER — PIPERACILLIN-TAZOBACTAM 3.375 G IVPB 30 MIN
3.3750 g | Freq: Once | INTRAVENOUS | Status: AC
  Administered 2016-03-23: 3.375 g via INTRAVENOUS
  Filled 2016-03-23: qty 50

## 2016-03-23 MED ORDER — PIPERACILLIN-TAZOBACTAM 3.375 G IVPB
3.3750 g | Freq: Three times a day (TID) | INTRAVENOUS | Status: DC
  Administered 2016-03-23 – 2016-03-26 (×9): 3.375 g via INTRAVENOUS
  Filled 2016-03-23 (×11): qty 50

## 2016-03-23 MED ORDER — PIPERACILLIN-TAZOBACTAM 3.375 G IVPB 30 MIN: 3.3750 g | Freq: Three times a day (TID) | INTRAVENOUS | Status: DC

## 2016-03-23 MED ORDER — BENZONATATE 100 MG PO CAPS
200.0000 mg | ORAL_CAPSULE | Freq: Three times a day (TID) | ORAL | Status: DC
  Administered 2016-03-23 – 2016-03-26 (×10): 200 mg via ORAL
  Filled 2016-03-23 (×11): qty 2

## 2016-03-23 MED ORDER — GI COCKTAIL ~~LOC~~
30.0000 mL | Freq: Once | ORAL | Status: AC
  Administered 2016-03-23: 30 mL via ORAL
  Filled 2016-03-23: qty 30

## 2016-03-23 NOTE — Progress Notes (Signed)
PROGRESS NOTE  Amy DEGRAFFENREID JHE:[id number] DOB: May 13, 1935 DOA: 03/22/2016 PCP: Tula Nakayama  Brief History:  81 year old female with a history of COPD, GERD, hyperlipidemia, hypothyroidism, esophageal stricture presented with one-day history of coughing, shortness of breath, and fever. Apparently, on the morning of admission, the patient felt like she had a burning sensation similar to her reflux in the past in her throat resulting in emesis. In addition, the patient also had fevers and chills with myalgias. She denied any chest pain,, headache, neck pain, dysuria, hematuria, medication, melena. She has some epigastric discomfort which has been chronic issue for her. The patient was recently discharged from the hospital after a stay from 01/24/2016 through 01/27/2016 when she was treated for intractable nausea and vomiting thought to be secondary to a viral gastroenteritis. In the emergency department, the patient had a fever 102.69F with tachycardia and WBC 13.4. Chest x-ray showed patchy right upper lobe infiltrates. The patient was admitted for treatment of pneumonia.  Assessment/Plan: Sepsis -The patient presented with hypoxia, leukocytosis, and tachycardia -Secondary to HCAP/Aspiration -lactic acid 1.11 -continue IVF -UA neg for pyuria -d/c ceftriaxone/azithromycin -start zosyn  HCAP/Aspiration pneumonia -d/c ceftriaxone/azithromycin -start zosyn -MRSA screen  Acute respiratory failure with hypoxia -stable on [place] -continue DuoNebs -continue LABA -wean oxygen for sat >92%  Hypothyroidism -continue synthroid  Esophageal stricture -Presumably from reflux esophagitis -Able to tolerate diet -Continue Protonix  Hyperlipidemia -Continue statin    Disposition Plan:   Home in 1-2 days  Family Communication: No  Family at bedside  Consultants:  none  Code Status: DNR  DVT Prophylaxis: [place] Lovenox   Procedures: As Listed in Progress Note  Above  Antibiotics: azithromycin 3/14 Ceftriaxone 3/14 Zosyn 3/15>>>    Subjective: Patient complains of cough and shortness of breath which are low but better than yesterday. She denies any fevers, chills, abdominal pain, dysuria, hematuria, headache, neck pain. No medication and melena. She is able to eat breakfast without difficulty this morning.  Objective: Vitals:   03/22/16 2023 03/22/16 2250 03/23/16 0453 03/23/16 0757  BP:  (!) 108/45 (!) 116/43   Pulse:  82 84   Resp:  20 18   Temp:  98.6 F (37 C) 99 F (37.2 C)   TempSrc:  Oral Oral   SpO2: 97% 100% 99% 98%  Weight:      Height:        Intake/Output Summary (Last 24 hours) at 03/23/16 0947 Last data filed at 03/23/16 0856  Gross per 24 hour  Intake          1176.67 ml  Output                0 ml  Net          1176.67 ml   Weight change:  Exam:   General:  Pt is alert, follows commands appropriately, not in acute distress  HEENT: No icterus, No thrush, No neck mass, [place]/AT  Cardiovascular: RRR, S1/S2, no rubs, no gallops  Respiratory: Bibasilar crackles, right greater than left. No wheezing.  Abdomen: Soft/+BS, epigastric tender, non distended, no guarding  Extremities: No edema, No lymphangitis, No petechiae, No rashes, no synovitis   Data Reviewed: I have personally reviewed following labs and imaging studies Basic Metabolic Panel:  Recent Labs Lab 03/22/16 1143 03/23/16 0625  NA 140 140  K 3.3* 3.8  CL 105 108  CO2 28 28  GLUCOSE 126* 105*  BUN 7 7  CREATININE 0.78 0.63  CALCIUM 8.7* 7.7*   Liver Function Tests:  Recent Labs Lab 03/22/16 1143  AST 33  ALT 20  ALKPHOS 129*  BILITOT 0.6  PROT 7.2  ALBUMIN 3.5   No results for input(s): LIPASE, AMYLASE in the last 168 hours. No results for input(s): AMMONIA in the last 168 hours. Coagulation Profile:  Recent Labs Lab 03/22/16 1143  INR 1.03   CBC:  Recent Labs Lab 03/22/16 1143 03/23/16 0625  WBC 13.4* 19.8*   NEUTROABS 11.0*  --   HGB 10.5* 8.6*  HCT 32.2* 27.1*  MCV 90.7 91.6  PLT 359 305   Cardiac Enzymes: No results for input(s): CKTOTAL, CKMB, CKMBINDEX, TROPONINI in the last 168 hours. BNP: Invalid input(s): POCBNP CBG: No results for input(s): GLUCAP in the last 168 hours. HbA1C: No results for input(s): HGBA1C in the last 72 hours. Urine analysis:    Component Value Date/Time   COLORURINE STRAW (A) 03/22/2016 1143   APPEARANCEUR CLEAR 03/22/2016 1143   LABSPEC 1.004 (L) 03/22/2016 1143   PHURINE 6.0 03/22/2016 1143   GLUCOSEU NEGATIVE 03/22/2016 1143   HGBUR SMALL (A) 03/22/2016 1143   BILIRUBINUR NEGATIVE 03/22/2016 1143   KETONESUR NEGATIVE 03/22/2016 1143   PROTEINUR NEGATIVE 03/22/2016 1143   UROBILINOGEN 0.2 12/14/2012 0541   NITRITE NEGATIVE 03/22/2016 1143   LEUKOCYTESUR NEGATIVE 03/22/2016 1143   Sepsis Labs: @LABRCNTIP (procalcitonin:4,lacticidven:4) )No results found for this or any previous visit (from the past 240 hour(s)).   Scheduled Meds: . aspirin EC  81 mg Oral QHS  . atorvastatin  20 mg Oral QHS  . azithromycin (ZITHROMAX) 500 MG IVPB  500 mg Intravenous Q24H  . cefTRIAXone (ROCEPHIN)  IV  1 g Intravenous Q24H  . enoxaparin (LOVENOX) injection  40 mg Subcutaneous Q24H  . levothyroxine  88 mcg Oral QAC breakfast  . mirtazapine  7.5 mg Oral QHS  . mometasone-formoterol  2 puff Inhalation BID  . oxyCODONE-acetaminophen  1 tablet Oral Once  . pantoprazole  40 mg Oral QHS   Continuous Infusions:  Procedures/Studies: Dg Chest 2 View  Result Date: 03/22/2016 CLINICAL DATA:  Fevers and cough EXAM: CHEST  2 VIEW COMPARISON:  02/26/2016 FINDINGS: Cardiac shadow is within normal limits. The lungs are hyperinflated consistent with COPD. Patchy infiltrative changes noted throughout the right lung. This predominately is located within the upper lobe on the lateral projection. No sizable effusion is seen. No bony abnormality is noted. Breast implants are seen.  IMPRESSION: Predominately right upper lobe infiltrate. Electronically Signed   By: Inez Catalina M.D.   On: 03/22/2016 11:39   Ct Abdomen Pelvis W Contrast  Result Date: 02/26/2016 CLINICAL DATA:  Patient with nausea and vomiting. EXAM: CT ABDOMEN AND PELVIS WITH CONTRAST TECHNIQUE: Multidetector CT imaging of the abdomen and pelvis was performed using the standard protocol following bolus administration of intravenous contrast. CONTRAST:  75 cc ISOVUE-300 IOPAMIDOL (ISOVUE-300) INJECTION 61% COMPARISON:  CT abdomen pelvis 12/13/2012. FINDINGS: Lower chest: Normal heart size. Minimal atelectasis within the lower lobes bilaterally. No pleural effusion. Hepatobiliary: Liver is normal in size and contour. No focal hepatic lesion is identified. Patient status post cholecystectomy. Mild intrahepatic and extrahepatic biliary ductal dilatation. Common bile duct measures 11 mm. Pancreas: Mild pancreatic parenchymal atrophy. Spleen: Unremarkable Adrenals/Urinary Tract: Adrenal glands are normal. Kidneys enhance symmetrically with contrast. Too small to characterize low-attenuation lesion inferior pole right kidney. Urinary bladder is distended. Stomach/Bowel: Descending and sigmoid colonic diverticulosis. No CT evidence for acute diverticulitis. Mild circumferential wall  thickening of the distal esophagus. The stomach and duodenum are distended with tapering to normal caliber small bowel. No free fluid or free intraperitoneal air. Vascular/Lymphatic: Peripheral calcified atherosclerotic plaque. No retroperitoneal lymphadenopathy. Reproductive: Status posthysterectomy. Other: None. Musculoskeletal: No aggressive or acute appearing osseous lesions. Lumbar spine degenerative changes. IMPRESSION: The stomach and duodenum are distended with decompressed mid and distal small bowel. There appears to be gradual taper from distended duodenum to normal caliber small bowel. Ileus and/or early obstruction not excluded. Intrahepatic  and extrahepatic biliary ductal dilatation likely physiologic given prior cholecystectomy. Obstruction is not entirely excluded. Recommend correlation with LFTs. Circumferential wall thickening of the distal esophagus which may be secondary to esophagitis. Recommend clinical correlation. Aortic atherosclerosis. Electronically Signed   By: Lovey Newcomer M.D.   On: 02/26/2016 17:40   Dg Abd Acute W/chest  Result Date: 02/26/2016 CLINICAL DATA:  Nausea and vomiting EXAM: DG ABDOMEN ACUTE W/ 1V CHEST COMPARISON:  Abdomen series January 25, 2016 FINDINGS: PA chest: There is no edema or consolidation. Heart size and pulmonary vascularity are normal. No adenopathy. Supine and upright abdomen: There is moderate stool in the colon. There is no bowel dilatation or air-fluid levels suggesting bowel obstruction. No free air. There is a clip in the right pelvis. IMPRESSION: No bowel obstruction or free air.  No lung edema or consolidation. Electronically Signed   By: Lowella Grip III M.D.   On: 02/26/2016 14:07    Amy Myszka, DO  [facility] Pager [phone]  If 7PM-7AM, please contact night-coverage [email or web address] Password [email or web address] 03/23/2016, 9:47 AM   LOS: 1 day

## 2016-03-23 NOTE — Evaluation (Signed)
Clinical/Bedside Swallow Evaluation Patient Details  Name: Amy [name] MRN: [id number] Date of Birth: [birth year]  Today's Date: 03/23/2016 Time: SLP Start Time (ACUTE ONLY): 14 SLP Stop Time (ACUTE ONLY): 1600 SLP Time Calculation (min) (ACUTE ONLY): 20 min  Past Medical History:  Past Medical History:  Diagnosis Date  . Anxiety   . Arthritis   . COPD (chronic obstructive pulmonary disease) ([place])   . Esophageal stricture    s/p repeated dilations, Dr. Watt Climes  . GERD (gastroesophageal reflux disease)   . Hiatal hernia   . Hyperlipidemia   . Hypothyroidism   . Osteoarthritis   . Osteoporosis   . Pulmonary nodules   . Renal lesion    1cm left kidney   Past Surgical History:  Past Surgical History:  Procedure Laterality Date  . ABDOMINAL HYSTERECTOMY    . BREAST ENHANCEMENT SURGERY  2006  . CATARACT EXTRACTION W/ INTRAOCULAR LENS  IMPLANT, BILATERAL  '09  . ESOPHAGEAL DILATION    . KNEE ARTHROSCOPY  02/22/2011   Procedure: ARTHROSCOPY KNEE;  Surgeon: Gearlean Alf, MD;  Location: [facility];  Service: Orthopedics;  Laterality: Right;  WITH DEBRIDEMENT    HPI:  81 yo female adm to [facility] with N/V - found to have pna.  PMH + for GERD, HTN, HLD, COPD.  Pt also with h/o esophageal stricture requiring dilatation multiple times (x3 per pt)- last time approximately 9 years ago.   Swallow eval ordered.  Pt reports she missed follow up appointment with her GI Md due to hospital admission.    Assessment / Plan / Recommendation Clinical Impression  Pt presents with functional oropharyngeal swallow and no indications of airway compromise with po intake. Swallow is timely with adequate respiratory/swallow reciprocity.  3 ounce water test passed with ease.  Pt reports symptoms consistent with globus (not associated with po) and drinking water effective to alleviate.     Pt admits to problem with GERD before admit - worsening with pantoprazole change to 20 mg recently from 40.   Pt states she increased her dose to 40 mg due to severe exacerbaton of symptoms - burning in the chest/throat.  Pt denies difficulties re: oropharyngeal swallow nor becoming dyspneic with intake.   Provided pt with education re: xerostomia, esophageal and respiratory/swallow difficulties verbally and in writing using teach back.  Do not suspect this pneumonia is due to aspiration from oropharyngeal swallow ability.   SLP Visit Diagnosis: Dysphagia, unspecified (R13.10)    Aspiration Risk  Mild aspiration risk    Diet Recommendation Regular;Thin liquid   Liquid Administration via: Cup;Straw Medication Administration: Whole meds with liquid Supervision: Patient able to self feed Compensations: Minimize environmental distractions;Slow rate;Small sips/bites (start meal with liquids) Postural Changes: Seated upright at 90 degrees;Remain upright for at least 30 minutes after po intake    Other  Recommendations Oral Care Recommendations: Oral care BID   Follow up Recommendations None      Frequency and Duration   n/a         Prognosis   n/a     Swallow Study   General Date of Onset: 03/23/16 HPI: 81 yo female adm to [facility] with N/V - found to have pna.  PMH + for GERD, HTN, HLD, COPD.  Pt also with h/o esophageal stricture requiring dilatation multiple times (x3 per pt)- last time approximately 9 years ago.   Swallow eval ordered.  Pt reports she missed follow up appointment with her GI Md due to hospital admission.  Type of Study: Bedside Swallow Evaluation Diet Prior to this Study: Regular;Thin liquids Temperature Spikes Noted: No Respiratory Status: Nasal cannula History of Recent Intubation: No Behavior/Cognition: Alert;Cooperative;Pleasant mood Oral Cavity Assessment: Within Functional Limits Oral Care Completed by SLP: No Oral Cavity - Dentition: Dentures, top;Dentures, bottom Vision: Functional for self-feeding Self-Feeding Abilities: Able to feed self Patient Positioning:  Upright in bed Baseline Vocal Quality: Suspected CN X (Vagus) involvement;Hoarse Volitional Cough: Strong Volitional Swallow: Able to elicit    Oral/Motor/Sensory Function Overall Oral Motor/Sensory Function: Generalized oral weakness   Ice Chips Ice chips: Not tested   Thin Liquid Thin Liquid: Within functional limits Presentation: Cup;Self Fed Other Comments: 3 ounce water test completed with adequate airway protection, exhalation postswallow protective    Nectar Thick Nectar Thick Liquid: Not tested   Honey Thick Honey Thick Liquid: Not tested   Puree Puree: Not tested   Solid   GO   Solid: Within functional limits Presentation: Self Fed Other Comments: chocolate        Luanna Salk, [facility] SLP [phone]

## 2016-03-24 ENCOUNTER — Inpatient Hospital Stay (HOSPITAL_COMMUNITY): Payer: Medicare Other

## 2016-03-24 DIAGNOSIS — Z8679 Personal history of other diseases of the circulatory system: Secondary | ICD-10-CM

## 2016-03-24 DIAGNOSIS — I4891 Unspecified atrial fibrillation: Secondary | ICD-10-CM

## 2016-03-24 DIAGNOSIS — I480 Paroxysmal atrial fibrillation: Secondary | ICD-10-CM

## 2016-03-24 HISTORY — PX: TRANSTHORACIC ECHOCARDIOGRAM: SHX275

## 2016-03-24 LAB — BASIC METABOLIC PANEL
Anion gap: 4 — ABNORMAL LOW (ref 5–15)
BUN: 9 mg/dL (ref 6–20)
CALCIUM: 7.6 mg/dL — AB (ref 8.9–10.3)
CO2: 29 mmol/L (ref 22–32)
CREATININE: 0.82 mg/dL (ref 0.44–1.00)
Chloride: 103 mmol/L (ref 101–111)
GFR calc Af Amer: >60 mL/min (ref >60)
GFR calc non Af Amer: >60 mL/min (ref >60)
GLUCOSE: 103 mg/dL — AB (ref 65–99)
Potassium: 3.7 mmol/L (ref 3.5–5.1)
Sodium: 136 mmol/L (ref 135–145)

## 2016-03-24 LAB — CBC
HCT: 24.3 % — ABNORMAL LOW (ref 36.0–46.0)
Hemoglobin: 7.8 g/dL — ABNORMAL LOW (ref 12.0–15.0)
MCH: 29.2 pg (ref 26.0–34.0)
MCHC: 32.1 g/dL (ref 30.0–36.0)
MCV: 91 fL (ref 78.0–100.0)
PLATELETS: 242 10*3/uL (ref 150–400)
RBC: 2.67 MIL/uL — ABNORMAL LOW (ref 3.87–5.11)
RDW: 15.2 % (ref 11.5–15.5)
WBC: 15.6 10*3/uL — ABNORMAL HIGH (ref 4.0–10.5)

## 2016-03-24 LAB — ECHOCARDIOGRAM COMPLETE
Height: 65 in
WEIGHTICAEL: 1939.2 [oz_av]

## 2016-03-24 MED ORDER — POTASSIUM CHLORIDE CRYS ER 20 MEQ PO TBCR
40.0000 meq | EXTENDED_RELEASE_TABLET | Freq: Once | ORAL | Status: AC
  Administered 2016-03-24: 40 meq via ORAL
  Filled 2016-03-24: qty 2

## 2016-03-24 MED ORDER — DILTIAZEM HCL 30 MG PO TABS
30.0000 mg | ORAL_TABLET | Freq: Four times a day (QID) | ORAL | Status: DC
  Administered 2016-03-24 – 2016-03-26 (×8): 30 mg via ORAL
  Filled 2016-03-24 (×10): qty 1

## 2016-03-24 MED ORDER — LEVOTHYROXINE SODIUM 75 MCG PO TABS
75.0000 ug | ORAL_TABLET | Freq: Every day | ORAL | Status: DC
  Administered 2016-03-25 – 2016-03-26 (×2): 75 ug via ORAL
  Filled 2016-03-24 (×2): qty 1

## 2016-03-24 NOTE — Consult Note (Signed)
Reason for Consult:   Atrial fibrilation  Requesting Physician: Dr Tat Primary Cardiologist Dr Marlou Porch  HPI:   Asked to see this pleasant 81 y/o female, admitted 03/22/16 with CAP and COPD exacerbation. She has a history of severe GERD and it sounds like she aspirated on the night of admission. She had seen Dr Marlou Porch in 2015 for ? AF but he could never find any evidence of this and made no medication changes. She has been febrile- temp on admission was 103. Today she had PAF from 1:15-2:40 pm. She is now back in NSR. She denies any history of tachycardia at home.   PMHx:  Past Medical History:  Diagnosis Date  . Anxiety   . Arthritis   . COPD (chronic obstructive pulmonary disease) ([place])   . Esophageal stricture    s/p repeated dilations, Dr. Watt Climes  . GERD (gastroesophageal reflux disease)   . Hiatal hernia   . Hyperlipidemia   . Hypothyroidism   . Osteoarthritis   . Osteoporosis   . Pulmonary nodules   . Renal lesion    1cm left kidney    Past Surgical History:  Procedure Laterality Date  . ABDOMINAL HYSTERECTOMY    . BREAST ENHANCEMENT SURGERY  2006  . CATARACT EXTRACTION W/ INTRAOCULAR LENS  IMPLANT, BILATERAL  '09  . ESOPHAGEAL DILATION    . KNEE ARTHROSCOPY  02/22/2011   Procedure: ARTHROSCOPY KNEE;  Surgeon: Gearlean Alf, MD;  Location: [facility];  Service: Orthopedics;  Laterality: Right;  WITH DEBRIDEMENT     SOCHx:  reports that she quit smoking about 19 years ago. Her smoking use included Cigarettes. She has a 30.00 pack-year smoking history. She has never used smokeless tobacco. She reports that she does not drink alcohol or use drugs.  FAMHx: Family History  Problem Relation Age of Onset  . Heart disease Father   . Rheum arthritis Father   . Heart failure Mother   . Diabetes Brother     ALLERGIES: Allergies  Allergen Reactions  . Codeine Other (See Comments)    hallucination    ROS: Review of Systems: General:  negative for chills, fever, night sweats or weight changes.  Cardiovascular: negative for chest pain, edema, orthopnea, palpitations, paroxysmal nocturnal dyspnea or shortness of breath HEENT: negative for any visual disturbances, blindness, glaucoma Dermatological: negative for rash Respiratory: negative for cough, hemoptysis, or wheezing Urologic: negative for hematuria or dysuria Abdominal: negative for nausea, vomiting, diarrhea, bright red blood per rectum, melena, or hematemesis Neurologic: negative for visual changes, syncope, or dizziness Musculoskeletal: negative for back pain, joint pain, or swelling Psych: cooperative and appropriate All other systems reviewed and are otherwise negative except as noted above.   HOME MEDICATIONS: Prior to Admission medications   Medication Sig Start Date End Date Taking? Authorizing Provider  ALPRAZolam Duanne Moron) 0.25 MG tablet Take 1 tablet by mouth daily as needed for anxiety or sleep.  03/26/12  Yes Historical Provider, MD  aspirin EC 81 MG tablet Take 81 mg by mouth at bedtime.   Yes Historical Provider, MD  atorvastatin (LIPITOR) 20 MG tablet Take 20 mg by mouth at bedtime. 01/31/16  Yes Historical Provider, MD  budesonide-formoterol (SYMBICORT) 160-4.5 MCG/ACT inhaler Inhale 2 puffs into the lungs 2 (two) times daily as needed (shortness of breath). 05/30/12  Yes Tanda Rockers, MD  Cholecalciferol (VITAMIN D3) 2000 UNITS TABS Take 2,000 Units by mouth at bedtime.    Yes Historical Provider,  MD  fluticasone (FLONASE) 50 MCG/ACT nasal spray Place 1-2 sprays into both nostrils daily as needed for allergies. 02/04/16  Yes Historical Provider, MD  ibuprofen (ADVIL,MOTRIN) 200 MG tablet Take 400 mg by mouth every 4 (four) hours as needed for fever, headache, mild pain, moderate pain or cramping.   Yes Historical Provider, MD  levothyroxine (SYNTHROID, LEVOTHROID) 88 MCG tablet Take 1 tablet (88 mcg total) by mouth daily. Patient taking differently: Take  88 mcg by mouth at bedtime.  01/27/16  Yes Clanford Marisa Hua, MD  mirtazapine (REMERON) 15 MG tablet Take 7.5 mg by mouth at bedtime.  10/17/13  Yes Historical Provider, MD  ondansetron (ZOFRAN) 4 MG tablet Take 1 tablet (4 mg total) by mouth every 6 (six) hours. Patient taking differently: Take 4 mg by mouth every 8 (eight) hours as needed for nausea or vomiting.  02/26/16  Yes Shary Decamp, PA-C  pantoprazole (PROTONIX) 40 MG tablet Take 40 mg by mouth at bedtime. 02/02/16  Yes Historical Provider, MD  traMADol (ULTRAM) 50 MG tablet Take 50 mg by mouth every 6 (six) hours as needed for moderate pain or severe pain.    Yes Historical Provider, MD    HOSPITAL MEDICATIONS: I have reviewed the patient's current medications.  VITALS: Blood pressure 132/61, pulse (!) 101, temperature 99.2 F (37.3 C), temperature source Oral, resp. rate 18, height 5\' 5"  (1.651 m), weight 121 lb 3.2 oz (55 kg), SpO2 93 %.  PHYSICAL EXAM: General appearance: alert, cooperative, no distress and thin Neck: no carotid bruit and no JVD Lungs: clear to auscultation bilaterally Heart: regular rate and rhythm and short systolic murmur AOV Abdomen: soft, non-tender; bowel sounds normal; no masses,  no organomegaly Extremities: extremities normal, atraumatic, no cyanosis or edema Pulses: 2+ and symmetric Skin: Skin color, texture, turgor normal. No rashes or lesions Neurologic: Grossly normal  LABS: Results for orders placed or performed during the hospital encounter of 03/22/16 (from the past 24 hour(s))  Basic metabolic panel     Status: Abnormal   Collection Time: 03/24/16  5:59 AM  Result Value Ref Range   Sodium 136 135 - 145 mmol/L   Potassium 3.7 3.5 - 5.1 mmol/L   Chloride 103 101 - 111 mmol/L   CO2 29 22 - 32 mmol/L   Glucose, Bld 103 (H) 65 - 99 mg/dL   BUN 9 6 - 20 mg/dL   Creatinine, Ser 0.82 0.44 - 1.00 mg/dL   Calcium 7.6 (L) 8.9 - 10.3 mg/dL   GFR calc non Af Amer >60 >60 mL/min   GFR calc Af Amer  >60 >60 mL/min   Anion gap 4 (L) 5 - 15  CBC     Status: Abnormal   Collection Time: 03/24/16  5:59 AM  Result Value Ref Range   WBC 15.6 (H) 4.0 - 10.5 K/uL   RBC 2.67 (L) 3.87 - 5.11 MIL/uL   Hemoglobin 7.8 (L) 12.0 - 15.0 g/dL   HCT 24.3 (L) 36.0 - 46.0 %   MCV 91.0 78.0 - 100.0 fL   MCH 29.2 26.0 - 34.0 pg   MCHC 32.1 30.0 - 36.0 g/dL   RDW 15.2 11.5 - 15.5 %   Platelets 242 150 - 400 K/uL    EKG: AF rate 110 this after noon NSR on admission 3/1`4/18  IMAGING: CXR 03/22/16 FINDINGS: Cardiac shadow is within normal limits. The lungs are hyperinflated consistent with COPD. Patchy infiltrative changes noted throughout the right lung. This predominately is located within  the upper lobe on the lateral projection. No sizable effusion is seen. No bony abnormality is noted. Breast implants are seen.  IMPRESSION: Predominately right upper lobe infiltrate.  Echo done- result pending  TSH- 0.048   IMPRESSION: Active Problems:   CAP (community acquired pneumonia)   Pneumonia   Sepsis ([place])   Lobar pneumonia ([place])   Aspiration pneumonia of right lung due to vomit ([place])   Acute respiratory failure with hypoxia ([place])   Atrial fibrillation ([place])   RECOMMENDATION: MD to see. Not sure she needs to be anticoagulated, only an hour or so of documented PAF. Hold Synthroid, agree with Diltiazem. Treat CAP. Keep K+ close to 4.0 (extra K+ ordered). Check Mg++ in am. Consider low dose beta blocker if needed.   Time Spent Directly with Patient: [address] minutes  Kerin Ransom, [place] beeper 03/24/2016, 4:29 PM   Agree with note written by Kerin Ransom [facility]  We are asked to see Ms. Amy [name] by Dr. Carles Collet for evaluation of atrial fibrillation. She is a 81 year old female who lives alone and has a history of COPD. Her other problems include hyperlipidemia and GERD. She apparently saw Dr. Marlou Porch 2015 for question A. fib but this never was substantiated. She also has a history of  hypothyroidism on thyroid replacement. She was admitted with community acquired pneumonia and placed on antibiotics. Her TSH is low. She developed transient A. fib with RVR and converted quickly to sinus rhythm. Her exam is benign. She does have a soft right carotid bruit. I do not think that given the transient nature of her current A. fib in the setting of pneumonia and hypothyroidism that she would require long-time oral anticoagulation. I think that once her thyroid status is adequately dressed and her pneumonia is treated if she has recurrent A. fib per the doctors would necessitate long-term term oral anticoagulation.   Quay Burow 03/24/2016 5:07 PM

## 2016-03-24 NOTE — Progress Notes (Addendum)
PROGRESS NOTE  Amy [name] SJG:[id number] DOB: 01/18/1935 DOA: 03/22/2016 PCP: Tula Nakayama  Brief History:  81 year old female with a history of COPD, GERD, hyperlipidemia, hypothyroidism, esophageal stricture presented with one-day history of coughing, shortness of breath, and fever. Apparently, on the morning of admission, the patient felt like she had a burning sensation similar to her reflux in the past in her throat resulting in emesis. In addition, the patient also had fevers and chills with myalgias. She denied any chest pain,, headache, neck pain, dysuria, hematuria, medication, melena. She has some epigastric discomfort which has been chronic issue for her. The patient was recently discharged from the hospital after a stay from 01/24/2016 through 01/27/2016 when she was treated for intractable nausea and vomiting thought to be secondary to a viral gastroenteritis. In the emergency department, the patient had a fever 102.29F with tachycardia and WBC 13.4. Chest x-ray showed patchy right upper lobe infiltrates. The patient was admitted for treatment of pneumonia.  Assessment/Plan: Sepsis -The patient presented with hypoxia, leukocytosis, and tachycardia -Secondary to HCAP/Aspiration -lactic acid 1.11 -continue IVF-->saline lock -UA neg for pyuria -d/c ceftriaxone/azithromycin -start zosyn  HCAP/Aspiration pneumonia -d/c ceftriaxone/azithromycin -continue zosyn -MRSA screen--negative -speech therapy eval-->regular diet with thin liquid  Acute respiratory failure with hypoxia -stable on [place]-->weaned to RA -continue DuoNebs -continue LABA -wean oxygen for sat >92%  New onset Afib/Aflutter -confirmed by EKG -likely precipitated by PNA -CHADSVASc = 3 (age, female) -previously saw Dr. Marlou Porch 04/25/13--did not have afib at that time -mild RVR--add dilitiazem -likely d/c ASA and start NOAC--defer to cardiology -consult  cardiology -Echo  Hypothyroidism -continue synthroid -decrease dose of synthroid (88-->75) due suppressed TSH -check Free T4  Esophageal stricture -Presumably from reflux esophagitis -Able to tolerate diet -Continue Protonix  Hyperlipidemia -Continue statin    Disposition Plan:   Home in 1-2 days  Family Communication: Daughter update at bedside--Total time spent 35 minutes.  Greater than 50% spent face to face counseling and coordinating care.  Consultants:  none  Code Status: DNR  DVT Prophylaxis: [place] Lovenox   Procedures: As Listed in Progress Note Above  Antibiotics: azithromycin 3/14 Ceftriaxone 3/14 Zosyn 3/15>>>    Subjective: Patient still complains of a cough and some shortness of breath but improving. Denies any nausea, vomiting, diarrhea, abdominal pain, dysuria, hematuria. She has had a bowel movement.  Objective: Vitals:   03/23/16 1950 03/23/16 1959 03/24/16 0447 03/24/16 0913  BP: (!) 119/48  (!) 102/54 (!) 143/63  Pulse: 92  81 100  Resp: 18  20   Temp: (!) 100.4 F (38 C)  99.5 F (37.5 C)   TempSrc: Oral  Oral   SpO2: 98% 96% 98% 91%  Weight:      Height:        Intake/Output Summary (Last 24 hours) at 03/24/16 1312 Last data filed at 03/24/16 0500  Gross per 24 hour  Intake              220 ml  Output                0 ml  Net              220 ml   Weight change:  Exam:   General:  Pt is alert, follows commands appropriately, not in acute distress  HEENT: No icterus, No thrush, No neck mass, [place]/AT  Cardiovascular: RRR, S1/S2, no rubs, no gallops  Respiratory: CBilateral crackles, right greater  than left. No wheezing. Good air movement.  Abdomen: Soft/+BS, non tender, non distended, no guarding  Extremities: No edema, No lymphangitis, No petechiae, No rashes, no synovitis   Data Reviewed: I have personally reviewed following labs and imaging studies Basic Metabolic Panel:  Recent Labs Lab 03/22/16 1143  03/23/16 0625 03/24/16 0559  NA 140 140 136  K 3.3* 3.8 3.7  CL 105 108 103  CO2 28 28 29   GLUCOSE 126* 105* 103*  BUN 7 7 9   CREATININE 0.78 0.63 0.82  CALCIUM 8.7* 7.7* 7.6*   Liver Function Tests:  Recent Labs Lab 03/22/16 1143  AST 33  ALT 20  ALKPHOS 129*  BILITOT 0.6  PROT 7.2  ALBUMIN 3.5   No results for input(s): LIPASE, AMYLASE in the last 168 hours. No results for input(s): AMMONIA in the last 168 hours. Coagulation Profile:  Recent Labs Lab 03/22/16 1143  INR 1.03   CBC:  Recent Labs Lab 03/22/16 1143 03/23/16 0625 03/24/16 0559  WBC 13.4* 19.8* 15.6*  NEUTROABS 11.0*  --   --   HGB 10.5* 8.6* 7.8*  HCT 32.2* 27.1* 24.3*  MCV 90.7 91.6 91.0  PLT 359 305 242   Cardiac Enzymes: No results for input(s): CKTOTAL, CKMB, CKMBINDEX, TROPONINI in the last 168 hours. BNP: Invalid input(s): POCBNP CBG: No results for input(s): GLUCAP in the last 168 hours. HbA1C: No results for input(s): HGBA1C in the last 72 hours. Urine analysis:    Component Value Date/Time   COLORURINE STRAW (A) 03/22/2016 1143   APPEARANCEUR CLEAR 03/22/2016 1143   LABSPEC 1.004 (L) 03/22/2016 1143   PHURINE 6.0 03/22/2016 1143   GLUCOSEU NEGATIVE 03/22/2016 1143   HGBUR SMALL (A) 03/22/2016 1143   BILIRUBINUR NEGATIVE 03/22/2016 1143   KETONESUR NEGATIVE 03/22/2016 1143   PROTEINUR NEGATIVE 03/22/2016 1143   UROBILINOGEN 0.2 12/14/2012 0541   NITRITE NEGATIVE 03/22/2016 1143   LEUKOCYTESUR NEGATIVE 03/22/2016 1143   Sepsis Labs: @LABRCNTIP (procalcitonin:4,lacticidven:4) ) Recent Results (from the past 240 hour(s))  Blood Culture (routine x 2)     Status: None (Preliminary result)   Collection Time: 03/22/16 11:43 AM  Result Value Ref Range Status   Specimen Description BLOOD LEFT WRIST  Final   Special Requests BOTTLES DRAWN AEROBIC AND ANAEROBIC 5CC  Final   Culture   Final    NO GROWTH 1 DAY Performed at [facility], [place] [address] [place],  [place] [address]    Report Status PENDING  Incomplete  Urine culture     Status: None   Collection Time: 03/22/16 11:43 AM  Result Value Ref Range Status   Specimen Description URINE, RANDOM  Final   Special Requests NONE  Final   Culture   Final    NO GROWTH Performed at [facility], [address], [place], [place] [address]    Report Status 03/23/2016 FINAL  Final  Blood Culture (routine x 2)     Status: None (Preliminary result)   Collection Time: 03/22/16 11:52 AM  Result Value Ref Range Status   Specimen Description BLOOD LEFT ANTECUBITAL  Final   Special Requests BOTTLES DRAWN AEROBIC AND ANAEROBIC 5CC  Final   Culture   Final    NO GROWTH 1 DAY Performed at [facility], [place] [address] [place], [place] [address]    Report Status PENDING  Incomplete  MRSA PCR Screening     Status: None   Collection Time: 03/23/16 10:01 AM  Result Value Ref  Range Status   MRSA by PCR NEGATIVE NEGATIVE Final    Comment:        The GeneXpert MRSA Assay (FDA approved for NASAL specimens only), is one component of a comprehensive MRSA colonization surveillance program. It is not intended to diagnose MRSA infection nor to guide or monitor treatment for MRSA infections.      Scheduled Meds: . aspirin EC  81 mg Oral QHS  . atorvastatin  20 mg Oral QHS  . benzonatate  200 mg Oral TID  . enoxaparin (LOVENOX) injection  40 mg Subcutaneous Q24H  . ipratropium-albuterol  3 mL Nebulization BID  . levothyroxine  88 mcg Oral QAC breakfast  . mirtazapine  7.5 mg Oral QHS  . mometasone-formoterol  2 puff Inhalation BID  . oxyCODONE-acetaminophen  1 tablet Oral Once  . pantoprazole  40 mg Oral QHS  . piperacillin-tazobactam (ZOSYN)  IV  3.375 g Intravenous Q8H   Continuous Infusions:  Procedures/Studies: Dg Chest 2 View  Result Date: 03/22/2016 CLINICAL DATA:  Fevers and cough EXAM: CHEST  2 VIEW COMPARISON:  02/26/2016 FINDINGS: Cardiac shadow is within normal limits. The lungs  are hyperinflated consistent with COPD. Patchy infiltrative changes noted throughout the right lung. This predominately is located within the upper lobe on the lateral projection. No sizable effusion is seen. No bony abnormality is noted. Breast implants are seen. IMPRESSION: Predominately right upper lobe infiltrate. Electronically Signed   By: Inez Catalina M.D.   On: 03/22/2016 11:39   Ct Abdomen Pelvis W Contrast  Result Date: 02/26/2016 CLINICAL DATA:  Patient with nausea and vomiting. EXAM: CT ABDOMEN AND PELVIS WITH CONTRAST TECHNIQUE: Multidetector CT imaging of the abdomen and pelvis was performed using the standard protocol following bolus administration of intravenous contrast. CONTRAST:  75 cc ISOVUE-300 IOPAMIDOL (ISOVUE-300) INJECTION 61% COMPARISON:  CT abdomen pelvis 12/13/2012. FINDINGS: Lower chest: Normal heart size. Minimal atelectasis within the lower lobes bilaterally. No pleural effusion. Hepatobiliary: Liver is normal in size and contour. No focal hepatic lesion is identified. Patient status post cholecystectomy. Mild intrahepatic and extrahepatic biliary ductal dilatation. Common bile duct measures 11 mm. Pancreas: Mild pancreatic parenchymal atrophy. Spleen: Unremarkable Adrenals/Urinary Tract: Adrenal glands are normal. Kidneys enhance symmetrically with contrast. Too small to characterize low-attenuation lesion inferior pole right kidney. Urinary bladder is distended. Stomach/Bowel: Descending and sigmoid colonic diverticulosis. No CT evidence for acute diverticulitis. Mild circumferential wall thickening of the distal esophagus. The stomach and duodenum are distended with tapering to normal caliber small bowel. No free fluid or free intraperitoneal air. Vascular/Lymphatic: Peripheral calcified atherosclerotic plaque. No retroperitoneal lymphadenopathy. Reproductive: Status posthysterectomy. Other: None. Musculoskeletal: No aggressive or acute appearing osseous lesions. Lumbar spine  degenerative changes. IMPRESSION: The stomach and duodenum are distended with decompressed mid and distal small bowel. There appears to be gradual taper from distended duodenum to normal caliber small bowel. Ileus and/or early obstruction not excluded. Intrahepatic and extrahepatic biliary ductal dilatation likely physiologic given prior cholecystectomy. Obstruction is not entirely excluded. Recommend correlation with LFTs. Circumferential wall thickening of the distal esophagus which may be secondary to esophagitis. Recommend clinical correlation. Aortic atherosclerosis. Electronically Signed   By: Lovey Newcomer M.D.   On: 02/26/2016 17:40   Dg Abd Acute W/chest  Result Date: 02/26/2016 CLINICAL DATA:  Nausea and vomiting EXAM: DG ABDOMEN ACUTE W/ 1V CHEST COMPARISON:  Abdomen series January 25, 2016 FINDINGS: PA chest: There is no edema or consolidation. Heart size and pulmonary vascularity are normal. No adenopathy. Supine and upright  abdomen: There is moderate stool in the colon. There is no bowel dilatation or air-fluid levels suggesting bowel obstruction. No free air. There is a clip in the right pelvis. IMPRESSION: No bowel obstruction or free air.  No lung edema or consolidation. Electronically Signed   By: Lowella Grip III M.D.   On: 02/26/2016 14:07    Jance Siek, DO  [facility] Pager [phone]  If 7PM-7AM, please contact night-coverage [email or web address] Password [email or web address] 03/24/2016, 1:12 PM   LOS: 2 days

## 2016-03-24 NOTE — Progress Notes (Signed)
  Echocardiogram 2D Echocardiogram has been performed.  Amy [name] 03/24/2016, 3:22 PM

## 2016-03-25 LAB — BASIC METABOLIC PANEL
ANION GAP: 6 (ref 5–15)
BUN: 8 mg/dL (ref 6–20)
CALCIUM: 8.2 mg/dL — AB (ref 8.9–10.3)
CO2: 27 mmol/L (ref 22–32)
Chloride: 101 mmol/L (ref 101–111)
Creatinine, Ser: 0.73 mg/dL (ref 0.44–1.00)
GLUCOSE: 90 mg/dL (ref 65–99)
POTASSIUM: 3.9 mmol/L (ref 3.5–5.1)
SODIUM: 134 mmol/L — AB (ref 135–145)

## 2016-03-25 LAB — CBC
HCT: 26.9 % — ABNORMAL LOW (ref 36.0–46.0)
Hemoglobin: 8.9 g/dL — ABNORMAL LOW (ref 12.0–15.0)
MCH: 30.3 pg (ref 26.0–34.0)
MCHC: 33.1 g/dL (ref 30.0–36.0)
MCV: 91.5 fL (ref 78.0–100.0)
PLATELETS: 307 10*3/uL (ref 150–400)
RBC: 2.94 MIL/uL — AB (ref 3.87–5.11)
RDW: 15.2 % (ref 11.5–15.5)
WBC: 15 10*3/uL — AB (ref 4.0–10.5)

## 2016-03-25 LAB — EXPECTORATED SPUTUM ASSESSMENT W GRAM STAIN, RFLX TO RESP C

## 2016-03-25 LAB — EXPECTORATED SPUTUM ASSESSMENT W REFEX TO RESP CULTURE

## 2016-03-25 LAB — T4, FREE: FREE T4: 1.42 ng/dL — AB (ref 0.61–1.12)

## 2016-03-25 LAB — MAGNESIUM: Magnesium: 1.9 mg/dL (ref 1.7–2.4)

## 2016-03-25 MED ORDER — IPRATROPIUM-ALBUTEROL 0.5-2.5 (3) MG/3ML IN SOLN
3.0000 mL | Freq: Three times a day (TID) | RESPIRATORY_TRACT | Status: DC
  Administered 2016-03-26 (×2): 3 mL via RESPIRATORY_TRACT
  Filled 2016-03-25 (×2): qty 3

## 2016-03-25 NOTE — Progress Notes (Signed)
[facility] PROGRESS NOTE  Amy WIGGLESWORTH QQP:[id number] DOB: 18-Mar-1935 DOA: 03/22/2016 PCP: Tula Nakayama  Brief summary 81 year old female with a history of COPD, GERD, hyperlipidemia, hypothyroidism, esophageal stricture presented with one-day history of coughing, shortness of breath, and fever. Apparently, on the morning of admission, the patient felt like she had a burning sensation similar to her reflux in the past in her throat resulting in emesis. In addition, the patient also had fevers and chills with myalgias. She denied any chest pain,, headache, neck pain, dysuria, hematuria, medication, melena. She has some epigastric discomfort which has been chronic issue for her. The patient was recently discharged from the hospital after a stay from 01/24/2016 through 01/27/2016 when she was treated for intractable nausea and vomiting thought to be secondary to a viral gastroenteritis. In the emergency department, the patient had a fever 102.49F with tachycardia and WBC 13.4. Chest x-ray showed patchy right upper lobe infiltrates. The patient was admitted for treatment of pneumonia.  Assessment/Plan:  Sepsis due to PNA. The patient presented with hypoxia, leukocytosis, and tachycardia. Sepsis resolved, cont to treat PNA  HCAP/Aspiration pneumonia. Cont iv continue zosyn. MRSA screen--negative. Blood cultures: NGTD -speech therapy eval-->regular diet with thin liquid  Acute respiratory failure with hypoxia due to PNA. stable on [place]-->weaned to RA. continue DuoNebs.   PAF-resolved. likely precipitated by PNA. CHADSVASc = 94 (age, female). Echo. LVEF 60%. Cardiology did not recommended chronic anticoagulation. Decreased levothyroxine    Hypothyroidism. decrease dose of synthroid (88-->75) due suppressed TSH, elevated free t4  Esophageal stricture. Presumably from reflux esophagitis. Able to tolerate diet. Continue Protonix  Hyperlipidemia. Continue statin   Code Status:  DNR Family Communication: d/w patient, rn (indicate person spoken with, relationship, and if by phone, the number) Disposition Plan: home 2-3 days   Consultants:  Cardiology   Procedures:  Echo   Antibiotics: azithromycin 3/14 Ceftriaxone 3/14  Zosyn 3/15>>>   (indicate start date, and stop date if known)  HPI/Subjective: Alert, reports feeling better, has mild cough   Objective: Vitals:   03/24/16 2031 03/25/16 0553  BP: (!) 139/58 (!) 103/43  Pulse: 93 89  Resp: 18 18  Temp: 100.3 F (37.9 C) 98.1 F (36.7 C)    Intake/Output Summary (Last 24 hours) at 03/25/16 1347 Last data filed at 03/25/16 0840  Gross per 24 hour  Intake              240 ml  Output                0 ml  Net              240 ml   Filed Weights   03/22/16 1157 03/22/16 1550  Weight: 54.9 kg (121 lb) 55 kg (121 lb 3.2 oz)    Exam:   General:  No distress   Cardiovascular: s1,s2 rrr  Respiratory: few rales Rlung  Abdomen: soft, nt, nd   Musculoskeletal: no leg edema   Data Reviewed: Basic Metabolic Panel:  Recent Labs Lab 03/22/16 1143 03/23/16 0625 03/24/16 0559 03/25/16 0633  NA 140 140 136 134*  K 3.3* 3.8 3.7 3.9  CL 105 108 103 101  CO2 28 28 29 27   GLUCOSE 126* 105* 103* 90  BUN 7 7 9 8   CREATININE 0.78 0.63 0.82 0.73  CALCIUM 8.7* 7.7* 7.6* 8.2*  MG  --   --   --  1.9   Liver Function Tests:  Recent Labs Lab 03/22/16 1143  AST 33  ALT 20  ALKPHOS 129*  BILITOT 0.6  PROT 7.2  ALBUMIN 3.5   No results for input(s): LIPASE, AMYLASE in the last 168 hours. No results for input(s): AMMONIA in the last 168 hours. CBC:  Recent Labs Lab 03/22/16 1143 03/23/16 0625 03/24/16 0559 03/25/16 0633  WBC 13.4* 19.8* 15.6* 15.0*  NEUTROABS 11.0*  --   --   --   HGB 10.5* 8.6* 7.8* 8.9*  HCT 32.2* 27.1* 24.3* 26.9*  MCV 90.7 91.6 91.0 91.5  PLT 359 305 242 307   Cardiac Enzymes: No results for input(s): CKTOTAL, CKMB, CKMBINDEX, TROPONINI in the last  168 hours. BNP (last 3 results) No results for input(s): BNP in the last 8760 hours.  ProBNP (last 3 results) No results for input(s): PROBNP in the last 8760 hours.  CBG: No results for input(s): GLUCAP in the last 168 hours.  Recent Results (from the past 240 hour(s))  Blood Culture (routine x 2)     Status: None (Preliminary result)   Collection Time: 03/22/16 11:43 AM  Result Value Ref Range Status   Specimen Description BLOOD LEFT WRIST  Final   Special Requests BOTTLES DRAWN AEROBIC AND ANAEROBIC 5CC  Final   Culture   Final    NO GROWTH 3 DAYS Performed at [facility], [address], [place], [place] [address]    Report Status PENDING  Incomplete  Urine culture     Status: None   Collection Time: 03/22/16 11:43 AM  Result Value Ref Range Status   Specimen Description URINE, RANDOM  Final   Special Requests NONE  Final   Culture   Final    NO GROWTH Performed at [facility], [address], [place], [place] [address]    Report Status 03/23/2016 FINAL  Final  Blood Culture (routine x 2)     Status: None (Preliminary result)   Collection Time: 03/22/16 11:52 AM  Result Value Ref Range Status   Specimen Description BLOOD LEFT ANTECUBITAL  Final   Special Requests BOTTLES DRAWN AEROBIC AND ANAEROBIC 5CC  Final   Culture   Final    NO GROWTH 3 DAYS Performed at [facility], [place] [address], [place], [place] [address]    Report Status PENDING  Incomplete  MRSA PCR Screening     Status: None   Collection Time: 03/23/16 10:01 AM  Result Value Ref Range Status   MRSA by PCR NEGATIVE NEGATIVE Final    Comment:        The GeneXpert MRSA Assay (FDA approved for NASAL specimens only), is one component of a comprehensive MRSA colonization surveillance program. It is not intended to diagnose MRSA infection nor to guide or monitor treatment for MRSA infections.      Studies: No results found.  Scheduled Meds: . aspirin EC  81 mg Oral QHS  .  atorvastatin  20 mg Oral QHS  . benzonatate  200 mg Oral TID  . diltiazem  30 mg Oral Q6H  . enoxaparin (LOVENOX) injection  40 mg Subcutaneous Q24H  . ipratropium-albuterol  3 mL Nebulization BID  . levothyroxine  75 mcg Oral QAC breakfast  . mirtazapine  7.5 mg Oral QHS  . mometasone-formoterol  2 puff Inhalation BID  . oxyCODONE-acetaminophen  1 tablet Oral Once  . pantoprazole  40 mg Oral QHS  . piperacillin-tazobactam (ZOSYN)  IV  3.375 g Intravenous Q8H   Continuous Infusions:  Active Problems:   CAP (community acquired pneumonia)  Pneumonia   Sepsis ([place])   Lobar pneumonia ([place])   Aspiration pneumonia of right lung due to vomit ([place])   Acute respiratory failure with hypoxia ([facility])   Atrial fibrillation ([place])    Time spent: >35 minutes    Kinnie Feil  [facility] Pager [phone]. If 7PM-7AM, please contact night-coverage at [email or web address], password [facility] 03/25/2016, 1:47 PM  LOS: 3 days

## 2016-03-25 NOTE — Progress Notes (Signed)
No further atrial arrhythmias on monitor. Will sign off.  Call with any questions.

## 2016-03-25 NOTE — Evaluation (Signed)
Physical Therapy Evaluation Patient Details Name: Amy [name] MRN: [id number] DOB: May 27, 1935 Today's Date: 03/25/2016   History of Present Illness  81 year old female with a history of COPD, GERD, hyperlipidemia, hypothyroidism, esophageal stricture presented with one-day history of coughing, shortness of breath, and fever.   Clinical Impression  Pt is independent with mobility, she ambulated 300' without an assistive device, with no loss of balance. SaO2 91% on RA walking, HR 107. From PT standpoint she is ready to DC home, no follow up PT needed. PT signing off.     Follow Up Recommendations No PT follow up    Equipment Recommendations  None recommended by PT    Recommendations for Other Services       Precautions / Restrictions Precautions Precautions: Fall Precaution Comments: no falls in past 1 year Restrictions Weight Bearing Restrictions: No      Mobility  Bed Mobility Overal bed mobility: Independent                Transfers Overall transfer level: Independent                  Ambulation/Gait Ambulation/Gait assistance: Independent Ambulation Distance (Feet): 300 Feet Assistive device: None Gait Pattern/deviations: WFL(Within Functional Limits)     General Gait Details: steady with no LOB, HR 107 walking, SaO2 91% on RA walking  Stairs            Wheelchair Mobility    Modified Rankin (Stroke Patients Only)       Balance Overall balance assessment: Independent                                           Pertinent Vitals/Pain Pain Assessment: No/denies pain    Home Living Family/patient expects to be discharged to:: Private residence Living Arrangements: Alone Available Help at Discharge: Friend(s);Available PRN/intermittently Type of Home: House Home Access: Stairs to enter   Entrance Stairs-Number of Steps: 2   Home Equipment: None      Prior Function Level of Independence: Independent          Comments: drives     Hand Dominance        Extremity/Trunk Assessment   Upper Extremity Assessment Upper Extremity Assessment: Overall WFL for tasks assessed    Lower Extremity Assessment Lower Extremity Assessment: Overall WFL for tasks assessed    Cervical / Trunk Assessment Cervical / Trunk Assessment: Normal  Communication   Communication: No difficulties  Cognition Arousal/Alertness: Awake/alert Behavior During Therapy: WFL for tasks assessed/performed Overall Cognitive Status: Within Functional Limits for tasks assessed                      General Comments      Exercises     Assessment/Plan    PT Assessment Patent does not need any further PT services  PT Problem List         PT Treatment Interventions      PT Goals (Current goals can be found in the Care Plan section)  Acute Rehab PT Goals Patient Stated Goal: likes to visit her sister and shop with friends PT Goal Formulation: All assessment and education complete, DC therapy    Frequency     Barriers to discharge        Co-evaluation               End of  Session Equipment Utilized During Treatment: Gait belt Activity Tolerance: Patient tolerated treatment well Patient left: in chair;with call bell/phone within reach Nurse Communication: Mobility status           Time: 7544-9201 PT Time Calculation (min) (ACUTE ONLY): 15 min   Charges:   PT Evaluation $PT Eval Low Complexity: 1 Procedure     PT G Codes:         Amy [name] 03/25/2016, 2:52 PM [phone]

## 2016-03-26 LAB — CBC
HEMATOCRIT: 23.5 % — AB (ref 36.0–46.0)
Hemoglobin: 7.8 g/dL — ABNORMAL LOW (ref 12.0–15.0)
MCH: 30.1 pg (ref 26.0–34.0)
MCHC: 33.2 g/dL (ref 30.0–36.0)
MCV: 90.7 fL (ref 78.0–100.0)
Platelets: 273 10*3/uL (ref 150–400)
RBC: 2.59 MIL/uL — ABNORMAL LOW (ref 3.87–5.11)
RDW: 15.2 % (ref 11.5–15.5)
WBC: 10.7 10*3/uL — ABNORMAL HIGH (ref 4.0–10.5)

## 2016-03-26 MED ORDER — METOPROLOL TARTRATE 12.5 MG HALF TABLET
12.5000 mg | ORAL_TABLET | Freq: Two times a day (BID) | ORAL | Status: DC
Start: 2016-03-26 — End: 2016-03-26

## 2016-03-26 MED ORDER — LEVOTHYROXINE SODIUM 75 MCG PO TABS: 75.0000 ug | ORAL_TABLET | Freq: Every day | ORAL | 1 refills | Status: DC

## 2016-03-26 MED ORDER — AMOXICILLIN-POT CLAVULANATE 875-125 MG PO TABS: 1.0000 | ORAL_TABLET | Freq: Two times a day (BID) | ORAL | 0 refills | Status: DC

## 2016-03-26 MED ORDER — AMOXICILLIN-POT CLAVULANATE 875-125 MG PO TABS: 1.0000 | ORAL_TABLET | Freq: Two times a day (BID) | ORAL | Status: DC

## 2016-03-26 MED ORDER — METOPROLOL TARTRATE 25 MG PO TABS
12.5000 mg | ORAL_TABLET | Freq: Two times a day (BID) | ORAL | 1 refills | Status: DC
Start: 2016-03-26 — End: 2020-11-22

## 2016-03-26 MED ORDER — BENZONATATE 200 MG PO CAPS: 200.0000 mg | ORAL_CAPSULE | Freq: Three times a day (TID) | ORAL | 0 refills | Status: DC

## 2016-03-26 MED ORDER — METOPROLOL TARTRATE 12.5 MG HALF TABLET: 12.5000 mg | ORAL_TABLET | Freq: Two times a day (BID) | ORAL | Status: DC

## 2016-03-26 NOTE — Discharge Summary (Signed)
Physician Discharge Summary  Amy [name]:[id number] DOB: 1935/06/11 DOA: 03/22/2016  PCP: Tula Nakayama  Admit date: 03/22/2016 Discharge date: 03/26/2016  Admitted From: Home Disposition:  Home   Recommendations for Outpatient Follow-up:  1. Follow up with PCP in 1-2 weeks 2. Please obtain BMP/CBC in one week  Home Health:No  Equipment/Devices: none  Discharge Condition: Stable CODE STATUS: DNR Diet recommendation: Heart Healthy    Brief/Interim Summary: 81 year old female with a history of COPD, GERD, hyperlipidemia, hypothyroidism, esophageal stricture presented with one-day history of coughing, shortness of breath, and fever. Apparently, on the morning of admission, the patient felt like she had a burning sensation similar to her reflux in the past in her throat resulting in emesis. In addition, the patient also had fevers and chills with myalgias. She denied any chest pain,, headache, neck pain, dysuria, hematuria, medication, melena. She has some epigastric discomfort which has been chronic issue for her. The patient was recently discharged from the hospital after a stay from 01/24/2016 through 01/27/2016 when she was treated for intractable nausea and vomiting thought to be secondary to a viral gastroenteritis. In the emergency department, the patient had a fever 102.26F with tachycardia and WBC 13.4. Chest x-ray showed patchy right upper lobe infiltrates. The patient was admitted for treatment of pneumonia.  Discharge Diagnoses:  Sepsis -The patient presented with hypoxia, leukocytosis, and tachycardia -Secondary to HCAP/Aspiration -lactic acid 1.11 -continue IVF-->saline lock -UA neg for pyuria -d/c ceftriaxone/azithromycin -start zosyn-->home with 3 more days of amox/clav to complete 7 days of tx  HCAP/Aspiration pneumonia -d/c ceftriaxone/azithromycin -continue zosyn-->home with 3 more days of amox/clav to complete 7 days of tx -MRSA  screen--negative -speech therapy eval-->regular diet with thin liquid  Acute respiratory failure with hypoxia -stable on [place]-->weaned to RA -continue DuoNebs -continue LABA -wean oxygen for sat >92%  New onset Afib/Aflutter -confirmed by EKG -likely precipitated by PNA -CHADSVASc = 3 (age, female) -previously saw Dr. Marlou Porch 04/25/13--did not have afib at that time -mild RVR--add dilitiazem -likely d/c ASA and start NOAC--defer to cardiology -consult cardiology-->did not recommend anticoagulation as pt has converted back to sinus rhythm and she has 2 identifiable causes of afib with her thyroid and infection which when treated appropriately, pt should maintain sinus -low dose metoprolol for now--12.5mg  bid -Echo--EF 60-65%, PASP 53  Hypothyroidism -continue synthroid -decrease dose of synthroid (88-->75) due suppressed TSH -check Free T4--1.42 -recheck TSH 3-4 weeks  Esophageal stricture -Presumably from reflux esophagitis -Able to tolerate diet -Continue Protonix  Hyperlipidemia -Continue statin   Discharge Instructions  Discharge Instructions    Diet - low sodium heart healthy    Complete by:  As directed    Increase activity slowly    Complete by:  As directed      Allergies as of 03/26/2016      Reactions   Codeine Other (See Comments)   hallucination      Medication List    STOP taking these medications   ibuprofen 200 MG tablet Commonly known as:  ADVIL,MOTRIN     TAKE these medications   ALPRAZolam 0.25 MG tablet Commonly known as:  XANAX Take 1 tablet by mouth daily as needed for anxiety or sleep.   amoxicillin-clavulanate 875-125 MG tablet Commonly known as:  AUGMENTIN Take 1 tablet by mouth every 12 (twelve) hours.   aspirin EC 81 MG tablet Take 81 mg by mouth at bedtime.   atorvastatin 20 MG tablet Commonly known as:  LIPITOR Take 20 mg by mouth at  bedtime.   benzonatate 200 MG capsule Commonly known as:  TESSALON Take 1 capsule  (200 mg total) by mouth 3 (three) times daily.   budesonide-formoterol 160-4.5 MCG/ACT inhaler Commonly known as:  SYMBICORT Inhale 2 puffs into the lungs 2 (two) times daily as needed (shortness of breath).   fluticasone 50 MCG/ACT nasal spray Commonly known as:  FLONASE Place 1-2 sprays into both nostrils daily as needed for allergies.   levothyroxine 75 MCG tablet Commonly known as:  SYNTHROID, LEVOTHROID Take 1 tablet (75 mcg total) by mouth daily before breakfast. Start taking on:  03/27/2016 What changed:  medication strength  how much to take  when to take this   metoprolol tartrate 25 MG tablet Commonly known as:  LOPRESSOR Take 0.5 tablets (12.5 mg total) by mouth 2 (two) times daily.   mirtazapine 15 MG tablet Commonly known as:  REMERON Take 7.5 mg by mouth at bedtime.   ondansetron 4 MG tablet Commonly known as:  ZOFRAN Take 1 tablet (4 mg total) by mouth every 6 (six) hours. What changed:  when to take this  reasons to take this   pantoprazole 40 MG tablet Commonly known as:  PROTONIX Take 40 mg by mouth at bedtime.   traMADol 50 MG tablet Commonly known as:  ULTRAM Take 50 mg by mouth every 6 (six) hours as needed for moderate pain or severe pain.   Vitamin D3 2000 units Tabs Take 2,000 Units by mouth at bedtime.       Allergies  Allergen Reactions  . Codeine Other (See Comments)    hallucination    Consultations:  cardiology   Procedures/Studies: Dg Chest 2 View  Result Date: 03/22/2016 CLINICAL DATA:  Fevers and cough EXAM: CHEST  2 VIEW COMPARISON:  02/26/2016 FINDINGS: Cardiac shadow is within normal limits. The lungs are hyperinflated consistent with COPD. Patchy infiltrative changes noted throughout the right lung. This predominately is located within the upper lobe on the lateral projection. No sizable effusion is seen. No bony abnormality is noted. Breast implants are seen. IMPRESSION: Predominately right upper lobe infiltrate.  Electronically Signed   By: Inez Catalina M.D.   On: 03/22/2016 11:39   Ct Abdomen Pelvis W Contrast  Result Date: 02/26/2016 CLINICAL DATA:  Patient with nausea and vomiting. EXAM: CT ABDOMEN AND PELVIS WITH CONTRAST TECHNIQUE: Multidetector CT imaging of the abdomen and pelvis was performed using the standard protocol following bolus administration of intravenous contrast. CONTRAST:  75 cc ISOVUE-300 IOPAMIDOL (ISOVUE-300) INJECTION 61% COMPARISON:  CT abdomen pelvis 12/13/2012. FINDINGS: Lower chest: Normal heart size. Minimal atelectasis within the lower lobes bilaterally. No pleural effusion. Hepatobiliary: Liver is normal in size and contour. No focal hepatic lesion is identified. Patient status post cholecystectomy. Mild intrahepatic and extrahepatic biliary ductal dilatation. Common bile duct measures 11 mm. Pancreas: Mild pancreatic parenchymal atrophy. Spleen: Unremarkable Adrenals/Urinary Tract: Adrenal glands are normal. Kidneys enhance symmetrically with contrast. Too small to characterize low-attenuation lesion inferior pole right kidney. Urinary bladder is distended. Stomach/Bowel: Descending and sigmoid colonic diverticulosis. No CT evidence for acute diverticulitis. Mild circumferential wall thickening of the distal esophagus. The stomach and duodenum are distended with tapering to normal caliber small bowel. No free fluid or free intraperitoneal air. Vascular/Lymphatic: Peripheral calcified atherosclerotic plaque. No retroperitoneal lymphadenopathy. Reproductive: Status posthysterectomy. Other: None. Musculoskeletal: No aggressive or acute appearing osseous lesions. Lumbar spine degenerative changes. IMPRESSION: The stomach and duodenum are distended with decompressed mid and distal small bowel. There appears to be gradual taper  from distended duodenum to normal caliber small bowel. Ileus and/or early obstruction not excluded. Intrahepatic and extrahepatic biliary ductal dilatation likely  physiologic given prior cholecystectomy. Obstruction is not entirely excluded. Recommend correlation with LFTs. Circumferential wall thickening of the distal esophagus which may be secondary to esophagitis. Recommend clinical correlation. Aortic atherosclerosis. Electronically Signed   By: Lovey Newcomer M.D.   On: 02/26/2016 17:40   Dg Abd Acute W/chest  Result Date: 02/26/2016 CLINICAL DATA:  Nausea and vomiting EXAM: DG ABDOMEN ACUTE W/ 1V CHEST COMPARISON:  Abdomen series January 25, 2016 FINDINGS: PA chest: There is no edema or consolidation. Heart size and pulmonary vascularity are normal. No adenopathy. Supine and upright abdomen: There is moderate stool in the colon. There is no bowel dilatation or air-fluid levels suggesting bowel obstruction. No free air. There is a clip in the right pelvis. IMPRESSION: No bowel obstruction or free air.  No lung edema or consolidation. Electronically Signed   By: Lowella Grip III M.D.   On: 02/26/2016 14:07         Discharge Exam: Vitals:   03/26/16 0553 03/26/16 1300  BP: (!) 108/46 133/66  Pulse: 73 89  Resp: 20 18  Temp: 99 F (37.2 C) 98.3 F (36.8 C)   Vitals:   03/26/16 0553 03/26/16 0815 03/26/16 1300 03/26/16 1351  BP: (!) 108/46  133/66   Pulse: 73  89   Resp: 20  18   Temp: 99 F (37.2 C)  98.3 F (36.8 C)   TempSrc: Oral  Oral   SpO2:  93% 95% 92%  Weight:      Height:        General: Pt is alert, awake, not in acute distress Cardiovascular: RRR, S1/S2 +, no rubs, no gallops Respiratory: R>L basilar rales, no wheeze Abdominal: Soft, NT, ND, bowel sounds + Extremities: no edema, no cyanosis   The results of significant diagnostics from this hospitalization (including imaging, microbiology, ancillary and laboratory) are listed below for reference.    Significant Diagnostic Studies: Dg Chest 2 View  Result Date: 03/22/2016 CLINICAL DATA:  Fevers and cough EXAM: CHEST  2 VIEW COMPARISON:  02/26/2016 FINDINGS: Cardiac  shadow is within normal limits. The lungs are hyperinflated consistent with COPD. Patchy infiltrative changes noted throughout the right lung. This predominately is located within the upper lobe on the lateral projection. No sizable effusion is seen. No bony abnormality is noted. Breast implants are seen. IMPRESSION: Predominately right upper lobe infiltrate. Electronically Signed   By: Inez Catalina M.D.   On: 03/22/2016 11:39   Ct Abdomen Pelvis W Contrast  Result Date: 02/26/2016 CLINICAL DATA:  Patient with nausea and vomiting. EXAM: CT ABDOMEN AND PELVIS WITH CONTRAST TECHNIQUE: Multidetector CT imaging of the abdomen and pelvis was performed using the standard protocol following bolus administration of intravenous contrast. CONTRAST:  75 cc ISOVUE-300 IOPAMIDOL (ISOVUE-300) INJECTION 61% COMPARISON:  CT abdomen pelvis 12/13/2012. FINDINGS: Lower chest: Normal heart size. Minimal atelectasis within the lower lobes bilaterally. No pleural effusion. Hepatobiliary: Liver is normal in size and contour. No focal hepatic lesion is identified. Patient status post cholecystectomy. Mild intrahepatic and extrahepatic biliary ductal dilatation. Common bile duct measures 11 mm. Pancreas: Mild pancreatic parenchymal atrophy. Spleen: Unremarkable Adrenals/Urinary Tract: Adrenal glands are normal. Kidneys enhance symmetrically with contrast. Too small to characterize low-attenuation lesion inferior pole right kidney. Urinary bladder is distended. Stomach/Bowel: Descending and sigmoid colonic diverticulosis. No CT evidence for acute diverticulitis. Mild circumferential wall thickening of the distal  esophagus. The stomach and duodenum are distended with tapering to normal caliber small bowel. No free fluid or free intraperitoneal air. Vascular/Lymphatic: Peripheral calcified atherosclerotic plaque. No retroperitoneal lymphadenopathy. Reproductive: Status posthysterectomy. Other: None. Musculoskeletal: No aggressive or acute  appearing osseous lesions. Lumbar spine degenerative changes. IMPRESSION: The stomach and duodenum are distended with decompressed mid and distal small bowel. There appears to be gradual taper from distended duodenum to normal caliber small bowel. Ileus and/or early obstruction not excluded. Intrahepatic and extrahepatic biliary ductal dilatation likely physiologic given prior cholecystectomy. Obstruction is not entirely excluded. Recommend correlation with LFTs. Circumferential wall thickening of the distal esophagus which may be secondary to esophagitis. Recommend clinical correlation. Aortic atherosclerosis. Electronically Signed   By: Lovey Newcomer M.D.   On: 02/26/2016 17:40   Dg Abd Acute W/chest  Result Date: 02/26/2016 CLINICAL DATA:  Nausea and vomiting EXAM: DG ABDOMEN ACUTE W/ 1V CHEST COMPARISON:  Abdomen series January 25, 2016 FINDINGS: PA chest: There is no edema or consolidation. Heart size and pulmonary vascularity are normal. No adenopathy. Supine and upright abdomen: There is moderate stool in the colon. There is no bowel dilatation or air-fluid levels suggesting bowel obstruction. No free air. There is a clip in the right pelvis. IMPRESSION: No bowel obstruction or free air.  No lung edema or consolidation. Electronically Signed   By: Lowella Grip III M.D.   On: 02/26/2016 14:07     Microbiology: Recent Results (from the past 240 hour(s))  Blood Culture (routine x 2)     Status: None (Preliminary result)   Collection Time: 03/22/16 11:43 AM  Result Value Ref Range Status   Specimen Description BLOOD LEFT WRIST  Final   Special Requests BOTTLES DRAWN AEROBIC AND ANAEROBIC 5CC  Final   Culture   Final    NO GROWTH 3 DAYS Performed at [facility], [address], [place], [place] [address]    Report Status PENDING  Incomplete  Urine culture     Status: None   Collection Time: 03/22/16 11:43 AM  Result Value Ref Range Status   Specimen Description URINE, RANDOM  Final    Special Requests NONE  Final   Culture   Final    NO GROWTH Performed at [facility], [address] [place], [place] [address]    Report Status 03/23/2016 FINAL  Final  Blood Culture (routine x 2)     Status: None (Preliminary result)   Collection Time: 03/22/16 11:52 AM  Result Value Ref Range Status   Specimen Description BLOOD LEFT ANTECUBITAL  Final   Special Requests BOTTLES DRAWN AEROBIC AND ANAEROBIC 5CC  Final   Culture   Final    NO GROWTH 3 DAYS Performed at [facility], [place] [address] [place], [place] [address]    Report Status PENDING  Incomplete  MRSA PCR Screening     Status: None   Collection Time: 03/23/16 10:01 AM  Result Value Ref Range Status   MRSA by PCR NEGATIVE NEGATIVE Final    Comment:        The GeneXpert MRSA Assay (FDA approved for NASAL specimens only), is one component of a comprehensive MRSA colonization surveillance program. It is not intended to diagnose MRSA infection nor to guide or monitor treatment for MRSA infections.   Culture, sputum-assessment     Status: None   Collection Time: 03/25/16  6:41 PM  Result Value Ref Range Status   Specimen Description SPU  Final  Special Requests NONE  Final   Sputum evaluation THIS SPECIMEN IS ACCEPTABLE FOR SPUTUM CULTURE  Final   Report Status 03/25/2016 FINAL  Final  Culture, respiratory (NON-Expectorated)     Status: None (Preliminary result)   Collection Time: 03/25/16  6:41 PM  Result Value Ref Range Status   Specimen Description SPU  Final   Special Requests NONE Reflexed from [id number]  Final   Gram Stain   Final    ABUNDANT WBC PRESENT, PREDOMINANTLY PMN FEW SQUAMOUS EPITHELIAL CELLS PRESENT FEW BUDDING YEAST SEEN    Culture   Final    TOO YOUNG TO READ Performed at [facility], [place] [address] [place], [place] [address]    Report Status PENDING  Incomplete     Labs: Basic Metabolic Panel:  Recent Labs Lab 03/22/16 1143 03/23/16 0625 03/24/16 0559  03/25/16 0633  NA 140 140 136 134*  K 3.3* 3.8 3.7 3.9  CL 105 108 103 101  CO2 28 28 29 27   GLUCOSE 126* 105* 103* 90  BUN 7 7 9 8   CREATININE 0.78 0.63 0.82 0.73  CALCIUM 8.7* 7.7* 7.6* 8.2*  MG  --   --   --  1.9   Liver Function Tests:  Recent Labs Lab 03/22/16 1143  AST 33  ALT 20  ALKPHOS 129*  BILITOT 0.6  PROT 7.2  ALBUMIN 3.5   No results for input(s): LIPASE, AMYLASE in the last 168 hours. No results for input(s): AMMONIA in the last 168 hours. CBC:  Recent Labs Lab 03/22/16 1143 03/23/16 0625 03/24/16 0559 03/25/16 0633 03/26/16 0527  WBC 13.4* 19.8* 15.6* 15.0* 10.7*  NEUTROABS 11.0*  --   --   --   --   HGB 10.5* 8.6* 7.8* 8.9* 7.8*  HCT 32.2* 27.1* 24.3* 26.9* 23.5*  MCV 90.7 91.6 91.0 91.5 90.7  PLT 359 305 242 307 273   Cardiac Enzymes: No results for input(s): CKTOTAL, CKMB, CKMBINDEX, TROPONINI in the last 168 hours. BNP: Invalid input(s): POCBNP CBG: No results for input(s): GLUCAP in the last 168 hours.  Time coordinating discharge:  Greater than 30 minutes  Signed:  Erynne Kealey, DO [facility] Pager: [phone] 03/26/2016, 2:43 PM

## 2016-03-26 NOTE — Care Management Important Message (Signed)
Important Message  Patient Details  Name: Amy [name] MRN: [id number] Date of Birth: 12-11-1935   Medicare Important Message Given:  Yes    Erenest Rasher, RN 03/26/2016, 3:37 PM

## 2016-03-26 NOTE — Progress Notes (Signed)
Patient discharged home.  Leaving with personal belongings and prescriptions.  Reports understanding of discharge instructions.  Room air, denies pain, no s/s of distress.  Accompanied by daughter.  No complaints.

## 2016-03-27 LAB — CULTURE, BLOOD (ROUTINE X 2)
CULTURE: NO GROWTH
Culture: NO GROWTH

## 2016-03-28 LAB — CULTURE, RESPIRATORY W GRAM STAIN

## 2016-03-28 LAB — CULTURE, RESPIRATORY

## 2016-04-03 ENCOUNTER — Emergency Department (HOSPITAL_BASED_OUTPATIENT_CLINIC_OR_DEPARTMENT_OTHER)
Admission: EM | Admit: 2016-04-03 | Discharge: 2016-04-03 | Disposition: A | Discharge status: Home / Self Care | Payer: Medicare Other | Attending: Emergency Medicine | Admitting: Emergency Medicine

## 2016-04-03 ENCOUNTER — Emergency Department (HOSPITAL_BASED_OUTPATIENT_CLINIC_OR_DEPARTMENT_OTHER): Payer: Medicare Other

## 2016-04-03 ENCOUNTER — Encounter (HOSPITAL_BASED_OUTPATIENT_CLINIC_OR_DEPARTMENT_OTHER): Payer: Self-pay

## 2016-04-03 DIAGNOSIS — Z79899 Other long term (current) drug therapy: Secondary | ICD-10-CM | POA: Diagnosis not present

## 2016-04-03 DIAGNOSIS — Z87891 Personal history of nicotine dependence: Secondary | ICD-10-CM | POA: Diagnosis not present

## 2016-04-03 DIAGNOSIS — E039 Hypothyroidism, unspecified: Secondary | ICD-10-CM | POA: Diagnosis not present

## 2016-04-03 DIAGNOSIS — J449 Chronic obstructive pulmonary disease, unspecified: Secondary | ICD-10-CM | POA: Diagnosis not present

## 2016-04-03 DIAGNOSIS — R531 Weakness: Secondary | ICD-10-CM

## 2016-04-03 DIAGNOSIS — D649 Anemia, unspecified: Secondary | ICD-10-CM | POA: Diagnosis not present

## 2016-04-03 DIAGNOSIS — Z7982 Long term (current) use of aspirin: Secondary | ICD-10-CM | POA: Insufficient documentation

## 2016-04-03 HISTORY — DX: Unspecified atrial fibrillation: I48.91

## 2016-04-03 LAB — CBC WITH DIFFERENTIAL/PLATELET
Basophils Absolute: 0 10*3/uL (ref 0.0–0.1)
Basophils Relative: 0 %
EOS ABS: 0.2 10*3/uL (ref 0.0–0.7)
Eosinophils Relative: 3 %
HCT: 33.6 % — ABNORMAL LOW (ref 36.0–46.0)
HEMOGLOBIN: 10.8 g/dL — AB (ref 12.0–15.0)
LYMPHS ABS: 3.6 10*3/uL (ref 0.7–4.0)
LYMPHS PCT: 46 %
MCH: 30.1 pg (ref 26.0–34.0)
MCHC: 32.1 g/dL (ref 30.0–36.0)
MCV: 93.6 fL (ref 78.0–100.0)
MONOS PCT: 8 %
Monocytes Absolute: 0.6 10*3/uL (ref 0.1–1.0)
Neutro Abs: 3.3 10*3/uL (ref 1.7–7.7)
Neutrophils Relative %: 43 %
PLATELETS: 655 10*3/uL — AB (ref 150–400)
RBC: 3.59 MIL/uL — ABNORMAL LOW (ref 3.87–5.11)
RDW: 14.6 % (ref 11.5–15.5)
WBC: 7.7 10*3/uL (ref 4.0–10.5)

## 2016-04-03 LAB — COMPREHENSIVE METABOLIC PANEL
ALK PHOS: 98 U/L (ref 38–126)
ALT: 12 U/L — AB (ref 14–54)
ANION GAP: 8 (ref 5–15)
AST: 22 U/L (ref 15–41)
Albumin: 3.2 g/dL — ABNORMAL LOW (ref 3.5–5.0)
BUN: 6 mg/dL (ref 6–20)
CALCIUM: 8.9 mg/dL (ref 8.9–10.3)
CO2: 30 mmol/L (ref 22–32)
Chloride: 99 mmol/L — ABNORMAL LOW (ref 101–111)
Creatinine, Ser: 0.72 mg/dL (ref 0.44–1.00)
Glucose, Bld: 112 mg/dL — ABNORMAL HIGH (ref 65–99)
Potassium: 4.1 mmol/L (ref 3.5–5.1)
Sodium: 137 mmol/L (ref 135–145)
Total Bilirubin: 0.3 mg/dL (ref 0.3–1.2)
Total Protein: 7.5 g/dL (ref 6.5–8.1)

## 2016-04-03 LAB — TROPONIN I: Troponin I: <0.03 ng/mL (ref <0.03)

## 2016-04-03 LAB — OCCULT BLOOD X 1 CARD TO LAB, STOOL: FECAL OCCULT BLD: NEGATIVE

## 2016-04-03 MED ORDER — SODIUM CHLORIDE 0.9 % IV BOLUS (SEPSIS)
500.0000 mL | Freq: Once | INTRAVENOUS | Status: AC
  Administered 2016-04-03: 500 mL via INTRAVENOUS

## 2016-04-03 MED ORDER — ONDANSETRON 4 MG PO TBDP: 4.0000 mg | ORAL_TABLET | Freq: Three times a day (TID) | ORAL | 0 refills | Status: DC | PRN

## 2016-04-03 NOTE — ED Notes (Signed)
Patient transported to X-ray 

## 2016-04-03 NOTE — ED Notes (Signed)
ED Provider at bedside. 

## 2016-04-03 NOTE — Discharge Instructions (Signed)
Follow-up tomorrow as planned. Your x-ray still shows pneumonia but at this point it could just be that it hasn't cleared yet from your previous pneumonia. Return sooner if more fevers chills or difficulty breathing. Will need to be further evaluated for your anemia. Her hemoglobin has increased but I suspect some of this is due to dehydration also.

## 2016-04-03 NOTE — ED Provider Notes (Signed)
[facility] Provider Note   CSN: [id number] Arrival date & time: 04/03/16  1504  By signing my name below, I, [name], attest that this documentation has been prepared under the direction and in the presence of [name], MD . Electronically Signed: [name], ED Scribe. 04/03/2016. 3:27 PM.    History   Chief Complaint Chief Complaint  Patient presents with  . Weakness    The history is provided by the patient. No language interpreter was used.   HPI Comments:  Amy [name] is a 81 y.o. female who presents to the Emergency Department complaining of constant generalized weakness for the past several days. Pt reports that her appetite has been decreased. Pt was discharged from the hospital last week for pnemonis, and this morning she called her PCP who referred her to the ED because her hgb was low. Pt notes that she has been having worsening GERD since being admitted. Pt recently finished a course of antibiotics.  No alleviating factors noted. Pt denies diarrhea, blood in stools, vaginal bleeding, SOB, fever, cough.   Past Medical History:  Diagnosis Date  . Anxiety   . Arthritis   . Atrial fibrillation ([place])   . COPD (chronic obstructive pulmonary disease) ([place])   . Esophageal stricture    s/p repeated dilations, Dr. Watt Climes  . GERD (gastroesophageal reflux disease)   . Hiatal hernia   . Hyperlipidemia   . Hypothyroidism   . Osteoarthritis   . Osteoporosis   . Pulmonary nodules   . Renal lesion    1cm left kidney    Patient Active Problem List   Diagnosis Date Noted  . Atrial fibrillation ([place]) 03/24/2016  . Lobar pneumonia ([place]) 03/23/2016  . Aspiration pneumonia of right lung due to vomit ([place]) 03/23/2016  . Acute respiratory failure with hypoxia ([place]) 03/23/2016  . Sepsis ([place])   . CAP (community acquired pneumonia) 03/22/2016  . Pneumonia 03/22/2016  . Viral gastroenteritis 01/25/2016  . Dehydration 01/25/2016  .  Leukocytosis 01/25/2016  . Hypokalemia 01/25/2016  . Hyperglycemia 01/25/2016  . Intractable nausea and vomiting 01/25/2016  . Epigastric pain   . Normocytic anemia 12/12/2012  . GERD (gastroesophageal reflux disease)   . Hypothyroidism   . Arthritis   . Renal lesion   . Osteoarthritis   . Osteoporosis   . COPD (chronic obstructive pulmonary disease) ([place])   . Esophageal stricture   . Pulmonary nodules   . Anxiety   . Hyperlipidemia   . Biliary colic 22/63/3354  . Multiple pulmonary nodules 04/24/2012  . COPD GOLD III 04/23/2012  . Abdominal pain, unspecified site 04/23/2012  . Medial meniscus tear 02/22/2011    Past Surgical History:  Procedure Laterality Date  . ABDOMINAL HYSTERECTOMY    . BREAST ENHANCEMENT SURGERY  2006  . CATARACT EXTRACTION W/ INTRAOCULAR LENS  IMPLANT, BILATERAL  '09  . ESOPHAGEAL DILATION    . KNEE ARTHROSCOPY  02/22/2011   Procedure: ARTHROSCOPY KNEE;  Surgeon: Gearlean Alf, MD;  Location: [facility];  Service: Orthopedics;  Laterality: Right;  WITH DEBRIDEMENT     OB History    No data available       Home Medications    Prior to Admission medications   Medication Sig Start Date End Date Taking? Authorizing Provider  ALPRAZolam Duanne Moron) 0.25 MG tablet Take 1 tablet by mouth daily as needed for anxiety or sleep.  03/26/12  Yes Historical Provider, MD  aspirin EC 81 MG  tablet Take 81 mg by mouth at bedtime.   Yes Historical Provider, MD  atorvastatin (LIPITOR) 20 MG tablet Take 20 mg by mouth at bedtime. 01/31/16  Yes Historical Provider, MD  benzonatate (TESSALON) 200 MG capsule Take 1 capsule (200 mg total) by mouth 3 (three) times daily. 03/26/16  Yes Orson Eva, MD  budesonide-formoterol [facility]) 160-4.5 MCG/ACT inhaler Inhale 2 puffs into the lungs 2 (two) times daily as needed (shortness of breath). 05/30/12  Yes Tanda Rockers, MD  Cholecalciferol (VITAMIN D3) 2000 UNITS TABS Take 2,000 Units by mouth at bedtime.    Yes  Historical Provider, MD  fluticasone (FLONASE) 50 MCG/ACT nasal spray Place 1-2 sprays into both nostrils daily as needed for allergies. 02/04/16  Yes Historical Provider, MD  levothyroxine (SYNTHROID, LEVOTHROID) 75 MCG tablet Take 1 tablet (75 mcg total) by mouth daily before breakfast. 03/27/16  Yes Orson Eva, MD  metoprolol tartrate (LOPRESSOR) 25 MG tablet Take 0.5 tablets (12.5 mg total) by mouth 2 (two) times daily. 03/26/16  Yes Orson Eva, MD  mirtazapine (REMERON) 15 MG tablet Take 7.5 mg by mouth at bedtime.  10/17/13  Yes Historical Provider, MD  ondansetron (ZOFRAN) 4 MG tablet Take 1 tablet (4 mg total) by mouth every 6 (six) hours. Patient taking differently: Take 4 mg by mouth every 8 (eight) hours as needed for nausea or vomiting.  02/26/16  Yes Shary Decamp, PA-C  pantoprazole (PROTONIX) 40 MG tablet Take 40 mg by mouth at bedtime. 02/02/16  Yes Historical Provider, MD  traMADol (ULTRAM) 50 MG tablet Take 50 mg by mouth every 6 (six) hours as needed for moderate pain or severe pain.    Yes Historical Provider, MD  amoxicillin-clavulanate (AUGMENTIN) 875-125 MG tablet Take 1 tablet by mouth every 12 (twelve) hours. 03/26/16   Orson Eva, MD  ondansetron (ZOFRAN-ODT) 4 MG disintegrating tablet Take 1 tablet (4 mg total) by mouth every 8 (eight) hours as needed for nausea or vomiting. 04/03/16   [name], MD    Family History Family History  Problem Relation Age of Onset  . Heart disease Father   . Rheum arthritis Father   . Heart failure Mother   . Diabetes Brother     Social History Social History  Substance Use Topics  . Smoking status: Former Smoker    Packs/day: 1.00    Years: 30.00    Types: Cigarettes    Quit date: 01/09/1997  . Smokeless tobacco: Never Used  . Alcohol use No     Allergies   Codeine   Review of Systems Review of Systems  Constitutional: Positive for appetite change. Negative for fever.  Respiratory: Negative for cough and shortness of breath.    Gastrointestinal: Negative for blood in stool and diarrhea.  Genitourinary: Negative for vaginal bleeding.  Neurological: Positive for weakness.  All other systems reviewed and are negative.    Physical Exam Updated Vital Signs BP (!) 144/79   Pulse 71   Temp 97.9 F (36.6 C) (Oral)   Resp (!) 23   Ht 5\' 5"  (1.651 m)   Wt 121 lb (54.9 kg)   SpO2 93%   BMI 20.14 kg/m   Physical Exam  Constitutional: She appears well-developed and well-nourished. No distress.  HENT:  Head: Normocephalic and atraumatic.  Eyes: Conjunctivae are normal.  Cardiovascular: Normal rate.   Pulmonary/Chest: Effort normal and breath sounds normal. No respiratory distress. She has no wheezes. She has no rales. She exhibits no tenderness.  Abdominal: She  exhibits distension. There is no tenderness. There is no rebound and no guarding.  Musculoskeletal: She exhibits no edema.  Neurological: She is alert.  Skin: Skin is warm and dry. There is pallor.  Psychiatric: She has a normal mood and affect.  Nursing note and vitals reviewed.    ED Treatments / Results  Labs (all labs ordered are listed, but only abnormal results are displayed) Labs Reviewed  CBC WITH DIFFERENTIAL/PLATELET - Abnormal; Notable for the following:       Result Value   RBC 3.59 (*)    Hemoglobin 10.8 (*)    HCT 33.6 (*)    Platelets 655 (*)    All other components within normal limits  COMPREHENSIVE METABOLIC PANEL - Abnormal; Notable for the following:    Chloride 99 (*)    Glucose, Bld 112 (*)    Albumin 3.2 (*)    ALT 12 (*)    All other components within normal limits  OCCULT BLOOD X 1 CARD TO LAB, STOOL  TROPONIN I    EKG  EKG Interpretation  Date/Time:  Monday April 03 2016 15:46:40 EDT Ventricular Rate:  64 PR Interval:    QRS Duration: 97 QT Interval:  425 QTC Calculation: 439 R Axis:   70 Text Interpretation:  Sinus rhythm Borderline low voltage, extremity leads Nonspecific T abnrm, anterolateral leads  afib resolved Confirmed by Alvino Chapel  MD, Kutler Vanvranken [phone]) on 04/03/2016 4:16:36 PM       Radiology Dg Chest 2 View  Result Date: 04/03/2016 CLINICAL DATA:  Cough, chest pain x1 week EXAM: CHEST  2 VIEW COMPARISON:  03/22/2016 FINDINGS: Right upper lobe, right perihilar, and left perihilar opacities, suspicious for multifocal pneumonia. No pleural effusion or pneumothorax. The heart is normal in size. Visualized osseous structures are within normal limits. IMPRESSION: Multifocal patchy opacities, right lung predominant, suspicious for pneumonia. Overall appearance is similar to the prior. Electronically Signed   By: Julian Hy M.D.   On: 04/03/2016 16:21    Procedures Procedures (including critical care time)  Medications Ordered in ED Medications  sodium chloride 0.9 % bolus 500 mL (0 mLs Intravenous Stopped 04/03/16 1745)     Initial Impression / Assessment and Plan / ED Course  I have reviewed the triage vital signs and the nursing notes.  Pertinent labs & imaging results that were available during my care of the patient were reviewed by me and considered in my medical decision making (see chart for details).     Patient presents with generalized weakness. Has had some nausea. Reportedly told to come in by her primary care doctor because of the anemia that she has had. He had a rare cough but this has improved since being admitted to the hospital with pneumonia recently. No fevers. No black stools. Has had some nausea but just finish up her antibiotics. Has had somewhat decreased oral intake. Labs overall reassuring. Hemoglobin increased to about 10 by think is probably somewhat hemoconcentrated due to some dehydration. IV fluid given patient does feel somewhat better. Tolerated orals here. X-ray shows stability from previous x-ray that showed a pneumonia. Clinically she is having less cough no sputum production and no fevers. I think this is likely a persistent x-ray that has not  cleared up.. Although she'll need to be followed further make sure this is a recurrence of pneumonia. Patient has follow-up with her primary care doctor tomorrow.  Final Clinical Impressions(s) / ED Diagnoses   Final diagnoses:  Weakness  Anemia, unspecified type  New Prescriptions Discharge Medication List as of 04/03/2016  5:34 PM    START taking these medications   Details  ondansetron (ZOFRAN-ODT) 4 MG disintegrating tablet Take 1 tablet (4 mg total) by mouth every 8 (eight) hours as needed for nausea or vomiting., Starting Mon 04/03/2016, Print      .reind    [name], MD 04/03/16 [phone]

## 2016-04-03 NOTE — ED Notes (Signed)
Patient denies pain and is resting comfortably.  

## 2016-04-03 NOTE — ED Triage Notes (Signed)
Pt states she was just discharged from the hospital, called her PCP this morning to verify her follow up appointment and tell them that she was still having generalized weakness and they said her hgb was low when she was discharged and advised her to come to the ED to get it checked out.  Family states she has had decreased appetite and nausea.  Pt denies bloody stools, denies blood in her vomit.

## 2016-04-25 ENCOUNTER — Emergency Department (HOSPITAL_COMMUNITY): Payer: Medicare Other

## 2016-04-25 ENCOUNTER — Emergency Department (HOSPITAL_COMMUNITY)
Admission: EM | Admit: 2016-04-25 | Discharge: 2016-04-26 | Disposition: A | Discharge status: Home / Self Care | Payer: Medicare Other | Attending: Emergency Medicine | Admitting: Emergency Medicine

## 2016-04-25 DIAGNOSIS — Z87891 Personal history of nicotine dependence: Secondary | ICD-10-CM | POA: Diagnosis not present

## 2016-04-25 DIAGNOSIS — Z7982 Long term (current) use of aspirin: Secondary | ICD-10-CM | POA: Insufficient documentation

## 2016-04-25 DIAGNOSIS — Z79899 Other long term (current) drug therapy: Secondary | ICD-10-CM | POA: Diagnosis not present

## 2016-04-25 DIAGNOSIS — E039 Hypothyroidism, unspecified: Secondary | ICD-10-CM | POA: Diagnosis not present

## 2016-04-25 DIAGNOSIS — K209 Esophagitis, unspecified without bleeding: Secondary | ICD-10-CM

## 2016-04-25 DIAGNOSIS — K529 Noninfective gastroenteritis and colitis, unspecified: Secondary | ICD-10-CM | POA: Diagnosis not present

## 2016-04-25 DIAGNOSIS — J449 Chronic obstructive pulmonary disease, unspecified: Secondary | ICD-10-CM | POA: Diagnosis not present

## 2016-04-25 DIAGNOSIS — R1084 Generalized abdominal pain: Secondary | ICD-10-CM | POA: Diagnosis not present

## 2016-04-25 DIAGNOSIS — R1032 Left lower quadrant pain: Secondary | ICD-10-CM | POA: Diagnosis present

## 2016-04-25 LAB — CBC WITH DIFFERENTIAL/PLATELET
BASOS ABS: 0 10*3/uL (ref 0.0–0.1)
Basophils Relative: 0 %
EOS PCT: 5 %
Eosinophils Absolute: 0.5 10*3/uL (ref 0.0–0.7)
HEMATOCRIT: 32.8 % — AB (ref 36.0–46.0)
Hemoglobin: 10.5 g/dL — ABNORMAL LOW (ref 12.0–15.0)
LYMPHS ABS: 4 10*3/uL (ref 0.7–4.0)
LYMPHS PCT: 34 %
MCH: 29.4 pg (ref 26.0–34.0)
MCHC: 32 g/dL (ref 30.0–36.0)
MCV: 91.9 fL (ref 78.0–100.0)
MONO ABS: 0.9 10*3/uL (ref 0.1–1.0)
Monocytes Relative: 8 %
NEUTROS ABS: 6 10*3/uL (ref 1.7–7.7)
Neutrophils Relative %: 53 %
PLATELETS: 234 10*3/uL (ref 150–400)
RBC: 3.57 MIL/uL — ABNORMAL LOW (ref 3.87–5.11)
RDW: 15.8 % — AB (ref 11.5–15.5)
WBC: 11.5 10*3/uL — ABNORMAL HIGH (ref 4.0–10.5)

## 2016-04-25 LAB — COMPREHENSIVE METABOLIC PANEL
ALBUMIN: 3.2 g/dL — AB (ref 3.5–5.0)
ALT: 15 U/L (ref 14–54)
AST: 24 U/L (ref 15–41)
Alkaline Phosphatase: 81 U/L (ref 38–126)
Anion gap: 5 (ref 5–15)
BUN: 9 mg/dL (ref 6–20)
CHLORIDE: 105 mmol/L (ref 101–111)
CO2: 27 mmol/L (ref 22–32)
CREATININE: 0.75 mg/dL (ref 0.44–1.00)
Calcium: 8.6 mg/dL — ABNORMAL LOW (ref 8.9–10.3)
GFR calc Af Amer: >60 mL/min (ref >60)
GFR calc non Af Amer: >60 mL/min (ref >60)
GLUCOSE: 111 mg/dL — AB (ref 65–99)
POTASSIUM: 3.4 mmol/L — AB (ref 3.5–5.1)
SODIUM: 137 mmol/L (ref 135–145)
Total Bilirubin: 0.4 mg/dL (ref 0.3–1.2)
Total Protein: 6.4 g/dL — ABNORMAL LOW (ref 6.5–8.1)

## 2016-04-25 LAB — URINALYSIS, ROUTINE W REFLEX MICROSCOPIC
BILIRUBIN URINE: NEGATIVE
Bacteria, UA: NONE SEEN
GLUCOSE, UA: NEGATIVE mg/dL
Ketones, ur: NEGATIVE mg/dL
Leukocytes, UA: NEGATIVE
NITRITE: NEGATIVE
Protein, ur: NEGATIVE mg/dL
SPECIFIC GRAVITY, URINE: 1.002 — AB (ref 1.005–1.030)
Squamous Epithelial / LPF: NONE SEEN
pH: 7 (ref 5.0–8.0)

## 2016-04-25 LAB — LIPASE, BLOOD: Lipase: 46 U/L (ref 11–51)

## 2016-04-25 MED ORDER — SODIUM CHLORIDE 0.9 % IV SOLN: INTRAVENOUS | Status: DC

## 2016-04-25 MED ORDER — IOPAMIDOL (ISOVUE-300) INJECTION 61%: 100.0000 mL | Freq: Once | INTRAVENOUS | Status: DC | PRN

## 2016-04-25 MED ORDER — IOPAMIDOL (ISOVUE-300) INJECTION 61%
INTRAVENOUS | Status: AC
  Filled 2016-04-25: qty 100

## 2016-04-25 MED ORDER — SODIUM CHLORIDE 0.9 % IV BOLUS (SEPSIS)
1000.0000 mL | Freq: Once | INTRAVENOUS | Status: AC
  Administered 2016-04-25: 1000 mL via INTRAVENOUS

## 2016-04-25 NOTE — ED Provider Notes (Signed)
[facility] DEPT Provider Note   CSN: [id number] Arrival date & time: 04/25/16  2027     History   Chief Complaint Chief Complaint  Patient presents with  . Abdominal Pain    HPI Amy [name] is a 81 y.o. female.  Pt presents to the ED today with LLQ pain.  She said that it started this morning.  She attributes it to eating fried squash last night.  She denies f/c or n/v.  She has had nl bm.  The pt has never had anything like this in the past.      Past Medical History:  Diagnosis Date  . Anxiety   . Arthritis   . Atrial fibrillation ([place])   . COPD (chronic obstructive pulmonary disease) ([place])   . Esophageal stricture    s/p repeated dilations, Dr. Watt Climes  . GERD (gastroesophageal reflux disease)   . Hiatal hernia   . Hyperlipidemia   . Hypothyroidism   . Osteoarthritis   . Osteoporosis   . Pulmonary nodules   . Renal lesion    1cm left kidney    Patient Active Problem List   Diagnosis Date Noted  . Atrial fibrillation ([place]) 03/24/2016  . Lobar pneumonia ([place]) 03/23/2016  . Aspiration pneumonia of right lung due to vomit ([place]) 03/23/2016  . Acute respiratory failure with hypoxia ([place]) 03/23/2016  . Sepsis ([place])   . CAP (community acquired pneumonia) 03/22/2016  . Pneumonia 03/22/2016  . Viral gastroenteritis 01/25/2016  . Dehydration 01/25/2016  . Leukocytosis 01/25/2016  . Hypokalemia 01/25/2016  . Hyperglycemia 01/25/2016  . Intractable nausea and vomiting 01/25/2016  . Epigastric pain   . Normocytic anemia 12/12/2012  . GERD (gastroesophageal reflux disease)   . Hypothyroidism   . Arthritis   . Renal lesion   . Osteoarthritis   . Osteoporosis   . COPD (chronic obstructive pulmonary disease) ([place])   . Esophageal stricture   . Pulmonary nodules   . Anxiety   . Hyperlipidemia   . Biliary colic 26/71/2458  . Multiple pulmonary nodules 04/24/2012  . COPD GOLD III 04/23/2012  . Abdominal pain, unspecified site 04/23/2012  . Medial meniscus  tear 02/22/2011    Past Surgical History:  Procedure Laterality Date  . ABDOMINAL HYSTERECTOMY    . BREAST ENHANCEMENT SURGERY  2006  . CATARACT EXTRACTION W/ INTRAOCULAR LENS  IMPLANT, BILATERAL  '09  . ESOPHAGEAL DILATION    . KNEE ARTHROSCOPY  02/22/2011   Procedure: ARTHROSCOPY KNEE;  Surgeon: Gearlean Alf, MD;  Location[facility];  Service: Orthopedics;  Laterality: Right;  WITH DEBRIDEMENT     OB History    No data available       Home Medications    Prior to Admission medications   Medication Sig Start Date End Date Taking? Authorizing Provider  ALPRAZolam (XANAX) 0.25 MG tablet Take 0.25 mg by mouth 3 (three) times daily as needed for anxiety or sleep.    Yes Historical Provider, MD  aspirin EC 81 MG tablet Take 81 mg by mouth at bedtime.   Yes Historical Provider, MD  atorvastatin (LIPITOR) 20 MG tablet Take 20 mg by mouth at bedtime.   Yes Historical Provider, MD  budesonide-formoterol (SYMBICORT) 160-4.5 MCG/ACT inhaler Inhale 2 puffs into the lungs 2 (two) times daily as needed (for shortness of breath).    Yes Tanda Rockers, MD  Cholecalciferol (VITAMIN D3) 2000 UNITS TABS Take 2,000 Units by mouth at bedtime.    Yes Historical Provider, MD  fluticasone (FLONASE) 50 MCG/ACT nasal spray Place 1-2 sprays into both nostrils daily as needed for rhinitis.    Yes Historical Provider, MD  levothyroxine (SYNTHROID, LEVOTHROID) 75 MCG tablet Take 1 tablet (75 mcg total) by mouth daily before breakfast. 03/27/16  Yes Orson Eva, MD  metoprolol tartrate (LOPRESSOR) 25 MG tablet Take 0.5 tablets (12.5 mg total) by mouth 2 (two) times daily. 03/26/16  Yes Orson Eva, MD  mirtazapine (REMERON) 15 MG tablet Take 7.5 mg by mouth at bedtime.    Yes Historical Provider, MD  pantoprazole (PROTONIX) 40 MG tablet Take 40 mg by mouth at bedtime.   Yes Historical Provider, MD  traMADol (ULTRAM) 50 MG tablet Take 50 mg by mouth every 6 (six) hours as needed for moderate pain  or severe pain.    Yes Historical Provider, MD  HYDROcodone-acetaminophen (NORCO/VICODIN) 5-325 MG tablet Take 1 tablet by mouth every 4 (four) hours as needed. 04/26/16   Isla Pence, MD  ondansetron (ZOFRAN ODT) 4 MG disintegrating tablet Take 1 tablet (4 mg total) by mouth every 8 (eight) hours as needed for nausea or vomiting. 04/26/16   Isla Pence, MD    Family History Family History  Problem Relation Age of Onset  . Heart disease Father   . Rheum arthritis Father   . Heart failure Mother   . Diabetes Brother     Social History Social History  Substance Use Topics  . Smoking status: Former Smoker    Packs/day: 1.00    Years: 30.00    Types: Cigarettes    Quit date: 01/09/1997  . Smokeless tobacco: Never Used  . Alcohol use No     Allergies   Codeine   Review of Systems Review of Systems  Gastrointestinal: Positive for abdominal pain.  All other systems reviewed and are negative.    Physical Exam Updated Vital Signs BP (!) 143/69 (BP Location: Left Arm)   Pulse 65   Temp 98 F (36.7 C) (Oral)   Resp 16   Ht 5\' 5"  (1.651 m)   Wt 120 lb (54.4 kg)   SpO2 94%   BMI 19.97 kg/m   Physical Exam  Constitutional: She is oriented to person, place, and time. She appears well-developed and well-nourished.  HENT:  Head: Normocephalic and atraumatic.  Right Ear: External ear normal.  Left Ear: External ear normal.  Nose: Nose normal.  Mouth/Throat: Oropharynx is clear and moist.  Eyes: Conjunctivae and EOM are normal. Pupils are equal, round, and reactive to light.  Neck: Normal range of motion. Neck supple.  Cardiovascular: Normal rate, regular rhythm, normal heart sounds and intact distal pulses.   Pulmonary/Chest: Effort normal and breath sounds normal.  Abdominal: Soft. Bowel sounds are normal. There is tenderness in the left lower quadrant.  Musculoskeletal: Normal range of motion.  Neurological: She is alert and oriented to person, place, and time.    Skin: Skin is warm and dry. Capillary refill takes less than 2 seconds.  Psychiatric: She has a normal mood and affect. Her behavior is normal. Judgment and thought content normal.  Nursing note and vitals reviewed.    ED Treatments / Results  Labs (all labs ordered are listed, but only abnormal results are displayed) Labs Reviewed  COMPREHENSIVE METABOLIC PANEL - Abnormal; Notable for the following:       Result Value   Potassium 3.4 (*)    Glucose, Bld 111 (*)    Calcium 8.6 (*)    Total Protein 6.4 (*)  Albumin 3.2 (*)    All other components within normal limits  CBC WITH DIFFERENTIAL/PLATELET - Abnormal; Notable for the following:    WBC 11.5 (*)    RBC 3.57 (*)    Hemoglobin 10.5 (*)    HCT 32.8 (*)    RDW 15.8 (*)    All other components within normal limits  URINALYSIS, ROUTINE W REFLEX MICROSCOPIC - Abnormal; Notable for the following:    Color, Urine COLORLESS (*)    Specific Gravity, Urine 1.002 (*)    Hgb urine dipstick SMALL (*)    All other components within normal limits  LIPASE, BLOOD    EKG  EKG Interpretation None       Radiology Ct Abdomen Pelvis W Contrast  Result Date: 04/26/2016 CLINICAL DATA:  Acute onset of left lower quadrant abdominal cramping. Initial encounter. EXAM: CT ABDOMEN AND PELVIS WITH CONTRAST TECHNIQUE: Multidetector CT imaging of the abdomen and pelvis was performed using the standard protocol following bolus administration of intravenous contrast. CONTRAST:  100 mL of Isovue 300 IV contrast COMPARISON:  CT of the abdomen and pelvis performed 02/16/2016 FINDINGS: Lower chest: Diffuse wall thickening is noted along the distal esophagus, likely reflecting esophagitis. Mild patchy peripheral opacities are noted at the lung bases bilaterally, which may reflect an atypical infectious process. Hepatobiliary: The liver is grossly unremarkable. The patient is status post cholecystectomy, with clips noted along the gallbladder fossa. The  common bile duct is grossly unremarkable in appearance status post cholecystectomy. Pancreas: The pancreas is within normal limits. Spleen: The spleen is unremarkable in appearance. Adrenals/Urinary Tract: The adrenal glands are unremarkable in appearance. The kidneys are within normal limits. There is no evidence of hydronephrosis. No renal or ureteral stones are identified. No perinephric stranding is seen. Stomach/Bowel: The stomach is diffusely distended with fluid, solid material and air. There is diffuse distention of the duodenum and proximal jejunum, with suggestion of mucosal thickening along the jejunum, concerning for acute jejunitis, possibly infectious or inflammatory in nature. The more distal small bowel is decompressed and grossly unremarkable. The appendix is not visualized; there is no evidence for appendicitis. Scattered diverticulosis is noted along the sigmoid colon, without evidence of diverticulitis. Vascular/Lymphatic: Scattered calcification is seen along the abdominal aorta and its branches. The abdominal aorta is otherwise grossly unremarkable. The inferior vena cava is grossly unremarkable. No retroperitoneal lymphadenopathy is seen. No pelvic sidewall lymphadenopathy is identified. Reproductive: The bladder is significantly distended and grossly unremarkable. The patient is status post hysterectomy. No suspicious adnexal masses are seen. Other: No additional soft tissue abnormalities are seen. Musculoskeletal: No acute osseous abnormalities are identified. The visualized musculature is unremarkable in appearance. IMPRESSION: 1. Diffuse wall thickening along the distal esophagus, likely reflecting esophagitis. 2. Diffuse distention of the duodenum and proximal jejunum, with suggestion of mucosal thickening along the jejunum, concerning for acute jejunitis, possibly infectious or inflammatory in nature. 3. Associated diffuse distention of the stomach with fluid, solid material and air. 4.  Mild patchy peripheral opacities at the lung bases bilaterally, which may reflect an atypical infectious process. 5. Scattered diverticulosis along the sigmoid colon, without evidence of diverticulitis. 6. Scattered aortic atherosclerosis. Electronically Signed   By: Garald Balding M.D.   On: 04/26/2016 00:05    Procedures Procedures (including critical care time)  Medications Ordered in ED Medications  sodium chloride 0.9 % bolus 1,000 mL (1,000 mLs Intravenous New Bag/Given 04/25/16 2304)    And  0.9 %  sodium chloride infusion (not administered)  iopamidol (ISOVUE-300) 61 % injection (not administered)  iopamidol (ISOVUE-300) 61 % injection 100 mL (not administered)  gi cocktail (Maalox,Lidocaine,Donnatal) (not administered)     Initial Impression / Assessment and Plan / ED Course  I have reviewed the triage vital signs and the nursing notes.  Pertinent labs & imaging results that were available during my care of the patient were reviewed by me and considered in my medical decision making (see chart for details).    Pt is feeling much better.  She knows to return if worse and to f/u with GI.  Final Clinical Impressions(s) / ED Diagnoses   Final diagnoses:  Generalized abdominal pain  Esophagitis  Jejunitis    New Prescriptions New Prescriptions   HYDROCODONE-ACETAMINOPHEN (NORCO/VICODIN) 5-325 MG TABLET    Take 1 tablet by mouth every 4 (four) hours as needed.   ONDANSETRON (ZOFRAN ODT) 4 MG DISINTEGRATING TABLET    Take 1 tablet (4 mg total) by mouth every 8 (eight) hours as needed for nausea or vomiting.     Isla Pence, MD 04/26/16 [phone]

## 2016-04-25 NOTE — ED Triage Notes (Signed)
Pt states that she ate fried squash at 4pm yesterday for lunch. Pt states that she went to bed and woke up for breakfast . Pt states that after eating breakfast stomach began to cramp. Pt states that she never eats fried squash and believes that is the cause of her pain. Per [facility] pt was ambulatory on site, pt denies N/V/D, requested to be brought to [facility] for evaluation of pain. Pt has hx of GERD, hiatal hernia, and esophageal strictures/dilation. Marland Kitchen

## 2016-04-25 NOTE — ED Notes (Signed)
Bed: IO27 Expected date:  Expected time:  Means of arrival:  Comments: Abdominal pain, HTN

## 2016-04-26 MED ORDER — HYDROCODONE-ACETAMINOPHEN 5-325 MG PO TABS: 1.0000 | ORAL_TABLET | ORAL | 0 refills | Status: DC | PRN

## 2016-04-26 MED ORDER — ONDANSETRON 4 MG PO TBDP: 4.0000 mg | ORAL_TABLET | Freq: Three times a day (TID) | ORAL | 0 refills | Status: DC | PRN

## 2016-04-26 MED ORDER — GI COCKTAIL ~~LOC~~
30.0000 mL | Freq: Once | ORAL | Status: AC
  Administered 2016-04-26: 30 mL via ORAL
  Filled 2016-04-26: qty 30

## 2016-05-29 ENCOUNTER — Ambulatory Visit: Payer: Self-pay | Admitting: Surgery

## 2016-05-29 ENCOUNTER — Encounter (HOSPITAL_COMMUNITY): Payer: Self-pay | Admitting: Surgery

## 2016-05-29 DIAGNOSIS — K56600 Partial intestinal obstruction, unspecified as to cause: Secondary | ICD-10-CM | POA: Diagnosis present

## 2016-05-29 NOTE — H&P (Signed)
General Surgery [facility]  RENATTA SHRIEVES 05/29/2016 10:35 AM Location: [facility] Patient #: [id number] DOB: 1935/09/09 Widowed / Language: Cleophus Molt / Race: White Female   History of Present Illness Earnstine Regal MD; 05/29/2016 11:08 AM) The patient is a 81 year old female who presents with small bowel obstruction.  CC: small bowel obstruction  Patient is referred by Dr. Clarene Essex for evaluation of partial proximal jejunal obstruction. Patient first developed symptoms in January 2018. She experiences upper abdominal discomfort after eating a large meal. This is associated with nausea. It happens on a daily basis. Patient has been eating small meals in order to control her symptoms. She has lost 12-13 pounds over the past 2-3 months. Evaluation has included a CT scan of the abdomen on April 25, 2016 which showed diffuse distention of the duodenum and proximal jejunum with mucosal thickening representing an inflammatory process. Patient underwent endoscopy by Dr. Watt Climes which also demonstrated an area of partial obstruction in the proximal jejunum. Patient is now referred for consideration for operative intervention for diagnosis and probable resection of a segment of small intestine. Patient has had prior cholecystectomy in [place]. She has a distant history of abdominal hysterectomy and bilateral salpingo-oophorectomy. She presents today accompanied by her daughter.   Past Surgical History Dalbert Mayotte, [place]; 05/29/2016 10:36 AM) Breast Biopsy  Left. Cataract Surgery  Bilateral. Hysterectomy (not due to cancer) - Complete  Knee Surgery  Right. Oral Surgery   Diagnostic Studies History Dalbert Mayotte, [place]; 05/29/2016 10:36 AM) Colonoscopy  1-5 years ago Mammogram  within last year Pap Smear  >5 years ago  Allergies Dalbert Mayotte, [place]; 05/29/2016 10:37 AM) No Known Drug Allergies 05/29/2016 Allergies Reconciled   Medication  History Dalbert Mayotte, CMA; 05/29/2016 10:42 AM) ALPRAZolam (0.25MG  Tablet, Oral) Active. Levothyroxine Sodium (88MCG Tablet, Oral) Active. Mirtazapine (15MG  Tablet, Oral) Active. Potassimin (75MG  Tablet, Oral) Active. Aspirin (81MG  Tablet DR, Oral) Active. Pravastatin Sodium (80MG  Tablet, Oral) Active. TraMADol HCl (50MG  Tablet, Oral) Active. Vitamin D2 (2000UNIT Tablet, Oral) Active. Ondansetron HCl (4MG  Tablet, Oral) Active. Atorvastatin Calcium (20MG  Tablet, Oral) Active. Budesonide (180MCG/ACT Inhaler, Inhalation) Active. Cholecalciferol (2000UNIT Capsule, Oral) Active. Medications Reconciled  Social History Dalbert Mayotte, [place]; 05/29/2016 10:36 AM) Caffeine use  Coffee, Tea. Tobacco use  Former smoker.  Family History Dalbert Mayotte, [place]; 05/29/2016 10:36 AM) Alcohol Abuse  Father. Arthritis  Mother, Sister. Colon Polyps  Sister. Diabetes Mellitus  Brother. Heart Disease  Mother. Hypertension  Mother. Malignant Neoplasm Of Pancreas  Family Members In General. Thyroid problems  Brother, Sister.  Pregnancy / Birth History Dalbert Mayotte, [place]; 05/29/2016 10:36 AM) Age at menarche  46 years. Age of menopause  <47 Gravida  3 Maternal age  56-25 Para  3 Regular periods   Other Problems Dalbert Mayotte, CMA; 05/29/2016 10:36 AM) Anxiety Disorder  Arthritis  Bladder Problems  Chronic Obstructive Lung Disease  Gastroesophageal Reflux Disease  Heart murmur  Hemorrhoids  Hypercholesterolemia  Thyroid Disease     Review of Systems Dalbert Mayotte CMA; 05/29/2016 10:36 AM) General Present- Appetite Loss, Fever and Weight Loss. Not Present- Chills, Fatigue, Night Sweats and Weight Gain. Skin Present- Dryness. Not Present- Change in Wart/Mole, Hives, Jaundice, New Lesions, Non-Healing Wounds, Rash and Ulcer. HEENT Present- Hearing Loss, Hoarseness and Visual Disturbances. Not Present- Earache, Nose Bleed, Oral Ulcers, Ringing in the  Ears, Seasonal Allergies, Sinus Pain, Sore Throat, Wears glasses/contact lenses and Yellow Eyes. Respiratory Present- Difficulty Breathing. Not Present- Bloody sputum, Chronic Cough,  Snoring and Wheezing. Cardiovascular Present- Leg Cramps and Shortness of Breath. Not Present- Chest Pain, Difficulty Breathing Lying Down, Palpitations, Rapid Heart Rate and Swelling of Extremities. Gastrointestinal Present- Abdominal Pain, Bloating, Difficulty Swallowing, Excessive gas, Gets full quickly at meals, Hemorrhoids, Nausea and Vomiting. Not Present- Bloody Stool, Change in Bowel Habits, Chronic diarrhea, Constipation, Indigestion and Rectal Pain. Female Genitourinary Present- Urgency. Not Present- Frequency, Nocturia, Painful Urination and Pelvic Pain. Musculoskeletal Present- Back Pain, Joint Pain and Muscle Pain. Not Present- Joint Stiffness, Muscle Weakness and Swelling of Extremities. Neurological Present- Tingling. Not Present- Decreased Memory, Fainting, Headaches, Numbness, Seizures, Tremor, Trouble walking and Weakness. Psychiatric Present- Anxiety. Not Present- Bipolar, Change in Sleep Pattern, Depression, Fearful and Frequent crying. Endocrine Present- Cold Intolerance and Heat Intolerance. Not Present- Excessive Hunger, Hair Changes, Hot flashes and New Diabetes. Hematology Present- Easy Bruising. Not Present- Blood Thinners, Excessive bleeding, Gland problems, HIV and Persistent Infections.  Vitals Dalbert Mayotte CMA; 05/29/2016 10:44 AM) 05/29/2016 10:43 AM Weight: 113.4 lb Height: 65in Body Surface Area: 1.55 m Body Mass Index: 18.87 kg/m  Temp.: 97.62F  Pulse: 67 (Regular)  BP: 160/74 (Sitting, Left Arm, Standard)       Physical Exam Earnstine Regal MD; 05/29/2016 11:09 AM) The physical exam findings are as follows: Note:CONSTITUTIONAL See vital signs recorded above  GENERAL APPEARANCE Development: normal Nutritional status: normal Gross deformities:  none  SKIN Rash, lesions, ulcers: none Induration, erythema: none Nodules: none palpable  EYES Conjunctiva and lids: normal Pupils: equal and reactive Iris: normal bilaterally  EARS, NOSE, MOUTH, THROAT External ears: no lesion or deformity External nose: no lesion or deformity Hearing: grossly normal Lips: no lesion or deformity Dentition: normal for age Oral mucosa: moist  NECK Symmetric: yes Trachea: midline Thyroid: no palpable nodules  CHEST Respiratory effort: normal Retraction or accessory muscle use: no Breath sounds: normal bilaterally Rales, rhonchi, wheeze: none  CARDIOVASCULAR Auscultation: regular rhythm, normal rate Murmurs: none Pulses: carotid and radial pulse 2+ palpable Lower extremity edema: none Lower extremity varicosities: none  ABDOMEN Distension: none Masses: none palpable Tenderness: none Hepatosplenomegaly: not present Hernia: not present Well-healed laparoscopic incisions without evidence of herniation  MUSCULOSKELETAL Station and gait: normal Digits and nails: no clubbing or cyanosis Muscle strength: grossly normal all extremities Range of motion: grossly normal all extremities Deformity: none  LYMPHATIC Cervical: none palpable Supraclavicular: none palpable Axillary: none palpable  PSYCHIATRIC Oriented to person, place, and time: yes Mood and affect: normal for situation Judgment and insight: appropriate for situation    Assessment & Plan Earnstine Regal MD; 05/29/2016 11:11 AM) SMALL BOWEL OBSTRUCTION, PARTIAL (K56.600) Current Plans Patient presents with signs and symptoms of partial small bowel obstruction. Patient has had symptoms now for 2-4 months. She has experienced weight loss. Diagnostic imaging and endoscopy indicate a proximal jejunal level of obstruction. I have discussed her case with her gastroenterologist.  I have recommended proceeding with diagnostic laparoscopy and possible small bowel resection.  We have discussed the procedure. We have discussed the location of the surgical incisions. We have discussed the possible need for intraoperative endoscopy. We have discussed the hospital course to be anticipated and her postoperative recovery.  Patient understands and wishes to proceed with surgery in the near future. We will make arrangements at a time convenient for the patient.  The risks and benefits of the procedure have been discussed at length with the patient. The patient understands the proposed procedure, potential alternative treatments, and the course of recovery to be expected. All  of the patient's questions have been answered at this time. The patient wishes to proceed with surgery.   Earnstine Regal, MD, [facility] Office: [phone]

## 2016-05-29 NOTE — Patient Instructions (Signed)
Amy [name]  05/29/2016   Your procedure is scheduled on: 06/02/2016    Report to [facility] Main  Entrance Take [place]  elevators to 3rd floor to  [place] at   [place] AM.     Call this number if you have problems the morning of surgery [phone]    Remember: ONLY 1 PERSON MAY GO WITH YOU TO SHORT STAY TO GET  READY MORNING OF YOUR SURGERY.  Do not eat food or drink liquids :After Midnight.     Take these medicines the morning of surgery with A SIP OF WATER: Xanax if needed, Synthroid, Symbicort inhaler if needed and bring, Flonase if needed, Eue drops as usual, Pantoprazole ( Protonix), Metoprolol ( Lopressor)                                 You may not have any metal on your body including hair pins and              piercings  Do not wear jewelry, make-up, lotions, powders or perfumes, deodorant             Do not wear nail polish.  Do not shave  48 hours prior to surgery.                 Do not bring valuables to the hospital. [place].  Contacts, dentures or bridgework may not be worn into surgery.  Leave suitcase in the car. After surgery it may be brought to your room.                       Please read over the following fact sheets you were given: _____________________________________________________________________             [facility] - Preparing for Surgery Before surgery, you can play an important role.  Because skin is not sterile, your skin needs to be as free of germs as possible.  You can reduce the number of germs on your skin by washing with CHG (chlorahexidine gluconate) soap before surgery.  CHG is an antiseptic cleaner which kills germs and bonds with the skin to continue killing germs even after washing. Please DO NOT use if you have an allergy to CHG or antibacterial soaps.  If your skin becomes reddened/irritated stop using the CHG and inform your nurse when you  arrive at Short Stay. Do not shave (including legs and underarms) for at least 48 hours prior to the first CHG shower.  You may shave your face/neck. Please follow these instructions carefully:  1.  Shower with CHG Soap the night before surgery and the  morning of Surgery.  2.  If you choose to wash your hair, wash your hair first as usual with your  normal  shampoo.  3.  After you shampoo, rinse your hair and body thoroughly to remove the  shampoo.                           4.  Use CHG as you would any other liquid soap.  You can apply chg directly  to the skin and wash  Gently with a scrungie or clean washcloth.  5.  Apply the CHG Soap to your body ONLY FROM THE NECK DOWN.   Do not use on face/ open                           Wound or open sores. Avoid contact with eyes, ears mouth and genitals (private parts).                       Wash face,  Genitals (private parts) with your normal soap.             6.  Wash thoroughly, paying special attention to the area where your surgery  will be performed.  7.  Thoroughly rinse your body with warm water from the neck down.  8.  DO NOT shower/wash with your normal soap after using and rinsing off  the CHG Soap.                9.  Pat yourself dry with a clean towel.            10.  Wear clean pajamas.            11.  Place clean sheets on your bed the night of your first shower and do not  sleep with pets. Day of Surgery : Do not apply any lotions/deodorants the morning of surgery.  Please wear clean clothes to the hospital/surgery center.  FAILURE TO FOLLOW THESE INSTRUCTIONS MAY RESULT IN THE CANCELLATION OF YOUR SURGERY PATIENT SIGNATURE_________________________________  NURSE SIGNATURE__________________________________  ________________________________________________________________________

## 2016-05-30 ENCOUNTER — Encounter (HOSPITAL_COMMUNITY): Payer: Self-pay

## 2016-05-30 ENCOUNTER — Ambulatory Visit (HOSPITAL_COMMUNITY)
Admission: RE | Admit: 2016-05-30 | Discharge: 2016-05-30 | Disposition: A | Discharge status: Home / Self Care | Payer: Medicare Other | Source: Ambulatory Visit | Attending: Anesthesiology | Admitting: Anesthesiology

## 2016-05-30 ENCOUNTER — Encounter (HOSPITAL_COMMUNITY)
Admission: RE | Admit: 2016-05-30 | Discharge: 2016-05-30 | Disposition: A | Discharge status: Home / Self Care | Payer: Medicare Other | Source: Ambulatory Visit | Attending: Surgery | Admitting: Surgery

## 2016-05-30 DIAGNOSIS — Z01818 Encounter for other preprocedural examination: Secondary | ICD-10-CM | POA: Insufficient documentation

## 2016-05-30 DIAGNOSIS — Z01810 Encounter for preprocedural cardiovascular examination: Secondary | ICD-10-CM

## 2016-05-30 DIAGNOSIS — K56600 Partial intestinal obstruction, unspecified as to cause: Secondary | ICD-10-CM

## 2016-05-30 HISTORY — DX: Personal history of other medical treatment: Z92.89

## 2016-05-30 HISTORY — DX: Cardiac murmur, unspecified: R01.1

## 2016-05-30 LAB — CBC
HEMATOCRIT: 33.7 % — AB (ref 36.0–46.0)
Hemoglobin: 10.6 g/dL — ABNORMAL LOW (ref 12.0–15.0)
MCH: 30.1 pg (ref 26.0–34.0)
MCHC: 31.5 g/dL (ref 30.0–36.0)
MCV: 95.7 fL (ref 78.0–100.0)
Platelets: 263 10*3/uL (ref 150–400)
RBC: 3.52 MIL/uL — ABNORMAL LOW (ref 3.87–5.11)
RDW: 15.2 % (ref 11.5–15.5)
WBC: 7.5 10*3/uL (ref 4.0–10.5)

## 2016-05-30 LAB — ABO/RH: ABO/RH(D): O POS

## 2016-05-30 LAB — BASIC METABOLIC PANEL
ANION GAP: 6 (ref 5–15)
BUN: 8 mg/dL (ref 6–20)
CO2: 31 mmol/L (ref 22–32)
Calcium: 8.2 mg/dL — ABNORMAL LOW (ref 8.9–10.3)
Chloride: 102 mmol/L (ref 101–111)
Creatinine, Ser: 0.79 mg/dL (ref 0.44–1.00)
GFR calc Af Amer: >60 mL/min (ref >60)
Glucose, Bld: 100 mg/dL — ABNORMAL HIGH (ref 65–99)
POTASSIUM: 3.3 mmol/L — AB (ref 3.5–5.1)
SODIUM: 139 mmol/L (ref 135–145)

## 2016-05-30 NOTE — Progress Notes (Signed)
CBC and BMP done 05/30/2016 faxed via epic to Dr Harlow Asa

## 2016-05-30 NOTE — Progress Notes (Signed)
04/02/16-ekg=-epic  03/24/16-echo-epic

## 2016-06-02 ENCOUNTER — Inpatient Hospital Stay (HOSPITAL_COMMUNITY): Payer: Medicare Other | Admitting: Certified Registered Nurse Anesthetist

## 2016-06-02 ENCOUNTER — Encounter (HOSPITAL_COMMUNITY)
Admission: RE | Disposition: A | Discharge status: Home / Self Care | Payer: Self-pay | Source: Ambulatory Visit | Attending: Surgery

## 2016-06-02 ENCOUNTER — Encounter (HOSPITAL_COMMUNITY): Payer: Self-pay

## 2016-06-02 ENCOUNTER — Inpatient Hospital Stay (HOSPITAL_COMMUNITY)
Admission: RE | Admit: 2016-06-02 | Discharge: 2016-06-07 | DRG: 330 | Disposition: A | Discharge status: Home / Self Care | Payer: Medicare Other | Source: Ambulatory Visit | Attending: Surgery | Admitting: Surgery

## 2016-06-02 DIAGNOSIS — M1990 Unspecified osteoarthritis, unspecified site: Secondary | ICD-10-CM | POA: Diagnosis present

## 2016-06-02 DIAGNOSIS — Z90710 Acquired absence of both cervix and uterus: Secondary | ICD-10-CM | POA: Diagnosis not present

## 2016-06-02 DIAGNOSIS — D62 Acute posthemorrhagic anemia: Secondary | ICD-10-CM | POA: Diagnosis not present

## 2016-06-02 DIAGNOSIS — E7800 Pure hypercholesterolemia, unspecified: Secondary | ICD-10-CM | POA: Diagnosis present

## 2016-06-02 DIAGNOSIS — E039 Hypothyroidism, unspecified: Secondary | ICD-10-CM | POA: Diagnosis present

## 2016-06-02 DIAGNOSIS — Z8371 Family history of colonic polyps: Secondary | ICD-10-CM | POA: Diagnosis not present

## 2016-06-02 DIAGNOSIS — Z7982 Long term (current) use of aspirin: Secondary | ICD-10-CM

## 2016-06-02 DIAGNOSIS — Z8261 Family history of arthritis: Secondary | ICD-10-CM

## 2016-06-02 DIAGNOSIS — K91840 Postprocedural hemorrhage and hematoma of a digestive system organ or structure following a digestive system procedure: Secondary | ICD-10-CM | POA: Diagnosis not present

## 2016-06-02 DIAGNOSIS — K5669 Other partial intestinal obstruction: Principal | ICD-10-CM | POA: Diagnosis present

## 2016-06-02 DIAGNOSIS — K56600 Partial intestinal obstruction, unspecified as to cause: Secondary | ICD-10-CM

## 2016-06-02 DIAGNOSIS — Z800 Family history of malignant neoplasm of digestive organs: Secondary | ICD-10-CM

## 2016-06-02 DIAGNOSIS — Z87891 Personal history of nicotine dependence: Secondary | ICD-10-CM

## 2016-06-02 DIAGNOSIS — K649 Unspecified hemorrhoids: Secondary | ICD-10-CM | POA: Diagnosis present

## 2016-06-02 DIAGNOSIS — Z825 Family history of asthma and other chronic lower respiratory diseases: Secondary | ICD-10-CM | POA: Diagnosis not present

## 2016-06-02 DIAGNOSIS — K219 Gastro-esophageal reflux disease without esophagitis: Secondary | ICD-10-CM | POA: Diagnosis present

## 2016-06-02 DIAGNOSIS — J449 Chronic obstructive pulmonary disease, unspecified: Secondary | ICD-10-CM | POA: Diagnosis present

## 2016-06-02 DIAGNOSIS — Y92230 Patient room in hospital as the place of occurrence of the external cause: Secondary | ICD-10-CM | POA: Diagnosis not present

## 2016-06-02 DIAGNOSIS — Y832 Surgical operation with anastomosis, bypass or graft as the cause of abnormal reaction of the patient, or of later complication, without mention of misadventure at the time of the procedure: Secondary | ICD-10-CM | POA: Diagnosis not present

## 2016-06-02 DIAGNOSIS — Z885 Allergy status to narcotic agent status: Secondary | ICD-10-CM | POA: Diagnosis not present

## 2016-06-02 DIAGNOSIS — F419 Anxiety disorder, unspecified: Secondary | ICD-10-CM | POA: Diagnosis present

## 2016-06-02 DIAGNOSIS — H9190 Unspecified hearing loss, unspecified ear: Secondary | ICD-10-CM | POA: Diagnosis present

## 2016-06-02 HISTORY — PX: LAPAROSCOPIC SMALL BOWEL RESECTION: SHX5929

## 2016-06-02 SURGERY — EXCISION, SMALL INTESTINE, LAPAROSCOPIC
Anesthesia: General

## 2016-06-02 MED ORDER — CHLORHEXIDINE GLUCONATE CLOTH 2 % EX PADS: 6.0000 | MEDICATED_PAD | Freq: Once | CUTANEOUS | Status: DC

## 2016-06-02 MED ORDER — DEXAMETHASONE SODIUM PHOSPHATE 10 MG/ML IJ SOLN
INTRAMUSCULAR | Status: AC
  Filled 2016-06-02: qty 1

## 2016-06-02 MED ORDER — PROPOFOL 10 MG/ML IV BOLUS
INTRAVENOUS | Status: AC
  Filled 2016-06-02: qty 20

## 2016-06-02 MED ORDER — ONDANSETRON HCL 4 MG/2ML IJ SOLN
INTRAMUSCULAR | Status: DC | PRN
  Administered 2016-06-02: 4 mg via INTRAVENOUS

## 2016-06-02 MED ORDER — SUGAMMADEX SODIUM 200 MG/2ML IV SOLN
INTRAVENOUS | Status: DC | PRN
  Administered 2016-06-02: 150 mg via INTRAVENOUS

## 2016-06-02 MED ORDER — ACETAMINOPHEN 325 MG PO TABS
650.0000 mg | ORAL_TABLET | Freq: Four times a day (QID) | ORAL | Status: DC | PRN
  Filled 2016-06-02: qty 2

## 2016-06-02 MED ORDER — LACTATED RINGERS IV SOLN
INTRAVENOUS | Status: DC | PRN
  Administered 2016-06-02: 07:00:00 via INTRAVENOUS

## 2016-06-02 MED ORDER — BUPIVACAINE HCL (PF) 0.25 % IJ SOLN
INTRAMUSCULAR | Status: AC
  Filled 2016-06-02: qty 30

## 2016-06-02 MED ORDER — 0.9 % SODIUM CHLORIDE (POUR BTL) OPTIME
TOPICAL | Status: DC | PRN
  Administered 2016-06-02: 2000 mL

## 2016-06-02 MED ORDER — PROPOFOL 10 MG/ML IV BOLUS
INTRAVENOUS | Status: DC | PRN
  Administered 2016-06-02: 100 mg via INTRAVENOUS

## 2016-06-02 MED ORDER — ROCURONIUM BROMIDE 50 MG/5ML IV SOSY
PREFILLED_SYRINGE | INTRAVENOUS | Status: DC | PRN
  Administered 2016-06-02: 40 mg via INTRAVENOUS
  Administered 2016-06-02: 5 mg via INTRAVENOUS

## 2016-06-02 MED ORDER — ONDANSETRON HCL 4 MG/2ML IJ SOLN: 4.0000 mg | Freq: Once | INTRAMUSCULAR | Status: DC | PRN

## 2016-06-02 MED ORDER — EPHEDRINE SULFATE 50 MG/ML IJ SOLN
INTRAMUSCULAR | Status: DC | PRN
  Administered 2016-06-02: 5 mg via INTRAVENOUS
  Administered 2016-06-02 (×3): 10 mg via INTRAVENOUS

## 2016-06-02 MED ORDER — MORPHINE SULFATE (PF) 4 MG/ML IV SOLN
1.0000 mg | INTRAVENOUS | Status: DC | PRN
  Administered 2016-06-02 (×2): 2 mg via INTRAVENOUS
  Administered 2016-06-02: 12:00:00 1 mg via INTRAVENOUS
  Administered 2016-06-03: 4 mg via INTRAVENOUS
  Administered 2016-06-03: 2 mg via INTRAVENOUS
  Filled 2016-06-02 (×5): qty 1

## 2016-06-02 MED ORDER — ACETAMINOPHEN 650 MG RE SUPP
650.0000 mg | Freq: Four times a day (QID) | RECTAL | Status: DC | PRN
  Administered 2016-06-05 – 2016-06-06 (×3): 650 mg via RECTAL
  Filled 2016-06-02 (×3): qty 1

## 2016-06-02 MED ORDER — LIDOCAINE 2% (20 MG/ML) 5 ML SYRINGE
INTRAMUSCULAR | Status: AC
  Filled 2016-06-02: qty 5

## 2016-06-02 MED ORDER — ACETAMINOPHEN 500 MG PO TABS
ORAL_TABLET | ORAL | Status: AC
  Filled 2016-06-02: qty 2

## 2016-06-02 MED ORDER — ERTAPENEM SODIUM 1 G IJ SOLR
1.0000 g | INTRAMUSCULAR | Status: AC
  Administered 2016-06-02: 1 g via INTRAVENOUS
  Filled 2016-06-02: qty 1

## 2016-06-02 MED ORDER — KCL IN DEXTROSE-NACL 20-5-0.45 MEQ/L-%-% IV SOLN
INTRAVENOUS | Status: DC
  Administered 2016-06-03 – 2016-06-05 (×6): via INTRAVENOUS
  Filled 2016-06-02 (×7): qty 1000

## 2016-06-02 MED ORDER — ACETAMINOPHEN 500 MG PO TABS
1000.0000 mg | ORAL_TABLET | Freq: Once | ORAL | Status: AC
  Administered 2016-06-02: 1000 mg via ORAL

## 2016-06-02 MED ORDER — PANTOPRAZOLE SODIUM 40 MG IV SOLR
40.0000 mg | Freq: Every day | INTRAVENOUS | Status: DC
  Administered 2016-06-02: 21:00:00 40 mg via INTRAVENOUS
  Filled 2016-06-02: qty 40

## 2016-06-02 MED ORDER — ONDANSETRON HCL 4 MG/2ML IJ SOLN
4.0000 mg | Freq: Four times a day (QID) | INTRAMUSCULAR | Status: DC | PRN
  Administered 2016-06-02 – 2016-06-03 (×2): 4 mg via INTRAVENOUS
  Filled 2016-06-02 (×2): qty 2

## 2016-06-02 MED ORDER — FENTANYL CITRATE (PF) 100 MCG/2ML IJ SOLN
INTRAMUSCULAR | Status: DC | PRN
  Administered 2016-06-02 (×2): 25 ug via INTRAVENOUS
  Administered 2016-06-02: 50 ug via INTRAVENOUS

## 2016-06-02 MED ORDER — DEXAMETHASONE SODIUM PHOSPHATE 10 MG/ML IJ SOLN
INTRAMUSCULAR | Status: DC | PRN
  Administered 2016-06-02: 10 mg via INTRAVENOUS

## 2016-06-02 MED ORDER — ONDANSETRON HCL 4 MG/2ML IJ SOLN
INTRAMUSCULAR | Status: AC
  Filled 2016-06-02: qty 2

## 2016-06-02 MED ORDER — LIDOCAINE 2% (20 MG/ML) 5 ML SYRINGE
INTRAMUSCULAR | Status: DC | PRN
  Administered 2016-06-02: 50 mg via INTRAVENOUS

## 2016-06-02 MED ORDER — FENTANYL CITRATE (PF) 250 MCG/5ML IJ SOLN
INTRAMUSCULAR | Status: AC
  Filled 2016-06-02: qty 5

## 2016-06-02 MED ORDER — ENOXAPARIN SODIUM 40 MG/0.4ML ~~LOC~~ SOLN: 40.0000 mg | SUBCUTANEOUS | Status: DC

## 2016-06-02 MED ORDER — ONDANSETRON 4 MG PO TBDP: 4.0000 mg | ORAL_TABLET | Freq: Four times a day (QID) | ORAL | Status: DC | PRN

## 2016-06-02 MED ORDER — FENTANYL CITRATE (PF) 100 MCG/2ML IJ SOLN
INTRAMUSCULAR | Status: AC
  Filled 2016-06-02: qty 4

## 2016-06-02 MED ORDER — FENTANYL CITRATE (PF) 100 MCG/2ML IJ SOLN
25.0000 ug | INTRAMUSCULAR | Status: DC | PRN
  Administered 2016-06-02: 25 ug via INTRAVENOUS
  Administered 2016-06-02: 50 ug via INTRAVENOUS

## 2016-06-02 MED ORDER — MENTHOL 3 MG MT LOZG
1.0000 | LOZENGE | OROMUCOSAL | Status: DC | PRN
  Administered 2016-06-02: 18:00:00 3 mg via ORAL
  Filled 2016-06-02: qty 9

## 2016-06-02 MED ORDER — PHENYLEPHRINE HCL 10 MG/ML IJ SOLN
INTRAMUSCULAR | Status: DC | PRN
  Administered 2016-06-02: 40 ug via INTRAVENOUS

## 2016-06-02 SURGICAL SUPPLY — 35 items
CHLORAPREP W/TINT 26ML (MISCELLANEOUS) ×3 IMPLANT
CLOSURE WOUND 1/2 X4 (GAUZE/BANDAGES/DRESSINGS)
COVER SURGICAL LIGHT HANDLE (MISCELLANEOUS) ×3 IMPLANT
DECANTER SPIKE VIAL GLASS SM (MISCELLANEOUS) IMPLANT
DRSG OPSITE POSTOP 4X6 (GAUZE/BANDAGES/DRESSINGS) ×3 IMPLANT
ELECT REM PT RETURN 15FT ADLT (MISCELLANEOUS) ×3 IMPLANT
GAUZE SPONGE 2X2 8PLY STRL LF (GAUZE/BANDAGES/DRESSINGS) ×1 IMPLANT
GLOVE BIOGEL PI IND STRL 7.0 (GLOVE) ×1 IMPLANT
GLOVE BIOGEL PI INDICATOR 7.0 (GLOVE) ×2
GLOVE SURG ORTHO 8.0 STRL STRW (GLOVE) ×3 IMPLANT
GOWN STRL REUS W/TWL LRG LVL3 (GOWN DISPOSABLE) ×3 IMPLANT
GOWN STRL REUS W/TWL XL LVL3 (GOWN DISPOSABLE) ×6 IMPLANT
IRRIG SUCT STRYKERFLOW 2 WTIP (MISCELLANEOUS)
IRRIGATION SUCT STRKRFLW 2 WTP (MISCELLANEOUS) IMPLANT
KIT BASIN OR (CUSTOM PROCEDURE TRAY) ×3 IMPLANT
RELOAD PROXIMATE 75MM BLUE (ENDOMECHANICALS) ×6 IMPLANT
SHEARS HARMONIC ACE PLUS 36CM (ENDOMECHANICALS) IMPLANT
SOLUTION ANTI FOG 6CC (MISCELLANEOUS) ×3 IMPLANT
SPONGE GAUZE 2X2 STER 10/PKG (GAUZE/BANDAGES/DRESSINGS) ×2
STAPLER GUN LINEAR PROX 60 (STAPLE) ×3 IMPLANT
STAPLER PROXIMATE 75MM BLUE (STAPLE) ×3 IMPLANT
STRIP CLOSURE SKIN 1/2X4 (GAUZE/BANDAGES/DRESSINGS) IMPLANT
SUT NOVA NAB GS-21 0 18 T12 DT (SUTURE) ×3 IMPLANT
SUT SILK 2 0 (SUTURE) ×2
SUT SILK 2-0 18XBRD TIE 12 (SUTURE) ×1 IMPLANT
SUT VIC AB 4-0 PS2 27 (SUTURE) IMPLANT
TAPE CLOTH SURG 4X10 WHT LF (GAUZE/BANDAGES/DRESSINGS) ×3 IMPLANT
TOWEL OR 17X26 10 PK STRL BLUE (TOWEL DISPOSABLE) ×3 IMPLANT
TRAY FOLEY W/METER SILVER 16FR (SET/KITS/TRAYS/PACK) IMPLANT
TRAY LAPAROSCOPIC (CUSTOM PROCEDURE TRAY) ×3 IMPLANT
TROCAR XCEL BLUNT TIP 100MML (ENDOMECHANICALS) ×3 IMPLANT
TROCAR XCEL NON-BLD 11X100MML (ENDOMECHANICALS) IMPLANT
TROCAR XCEL UNIV SLVE 11M 100M (ENDOMECHANICALS) IMPLANT
TUBING INSUF HEATED (TUBING) ×3 IMPLANT
WATER STERILE IRR 1500ML POUR (IV SOLUTION) ×3 IMPLANT

## 2016-06-02 NOTE — Progress Notes (Signed)
Patient has band aid on right big toe due to having an ingrown toenail cut out and it being sore. Patient requested nurse to leave bandaid. Nurse assessed skin around bandaid. There is no redness or swelling notes.

## 2016-06-02 NOTE — Anesthesia Procedure Notes (Signed)
Procedure Name: Intubation Date/Time: 06/02/2016 7:29 AM Performed by: West Pugh Pre-anesthesia Checklist: Patient identified, Emergency Drugs available, Suction available, Patient being monitored and Timeout performed Patient Re-evaluated:Patient Re-evaluated prior to inductionOxygen Delivery Method: Circle system utilized Preoxygenation: Pre-oxygenation with 100% oxygen Intubation Type: IV induction and Cricoid Pressure applied Ventilation: Mask ventilation without difficulty Laryngoscope Size: Mac and 4 Grade View: Grade I Tube type: Oral Tube size: 7.0 mm Number of attempts: 2 Airway Equipment and Method: Stylet Placement Confirmation: ETT inserted through vocal cords under direct vision,  positive ETCO2,  CO2 detector and breath sounds checked- equal and bilateral Secured at: 23 cm Tube secured with: Tape Dental Injury: Teeth and Oropharynx as per pre-operative assessment

## 2016-06-02 NOTE — Brief Op Note (Signed)
06/02/2016  8:48 AM  PATIENT:  Amy [name]  81 y.o. female  PRE-OPERATIVE DIAGNOSIS:  partial small bowel obstruction  POST-OPERATIVE DIAGNOSIS:  partial small bowel obstruction  PROCEDURE:  Procedure(s): DIAGNOSTIC LAPAROSCOPY SMALL BOWEL RESECTION (N/A)  SURGEON:  Surgeon(s) and Role:    Armandina Gemma, MD - Primary  ANESTHESIA:   general  EBL:  No intake/output data recorded.  BLOOD ADMINISTERED:none  DRAINS: none   LOCAL MEDICATIONS USED:  NONE  SPECIMEN:  Excision  DISPOSITION OF SPECIMEN:  PATHOLOGY  COUNTS:  YES  TOURNIQUET:  * No tourniquets in log *  DICTATION: .Other Dictation: Dictation Number [id number]  PLAN OF CARE: Admit to inpatient   PATIENT DISPOSITION:  PACU - hemodynamically stable.   Delay start of Pharmacological VTE agent (>24hrs) due to surgical blood loss or risk of bleeding: yes  Earnstine Regal, MD, [facility] Office: [phone]

## 2016-06-02 NOTE — Interval H&P Note (Signed)
History and Physical Interval Note:  06/02/2016 7:05 AM  Amy [name]  has presented today for surgery, with the diagnosis of partial small bowel obstruction  The various methods of treatment have been discussed with the patient and family. After consideration of risks, benefits and other options for treatment, the patient has consented to    Procedure(s): DIAGNOSTIC LAPAROSCOPY SMALL BOWEL RESECTION (N/A) as a surgical intervention .    The patient's history has been reviewed, patient examined, no change in status, stable for surgery.  I have reviewed the patient's chart and labs.  Questions were answered to the patient's satisfaction.    Earnstine Regal, MD, [facility] Office: [place]

## 2016-06-02 NOTE — Anesthesia Preprocedure Evaluation (Addendum)
Anesthesia Evaluation  Patient identified by MRN, date of birth, ID band Patient awake    Reviewed: Allergy & Precautions, NPO status , Patient's Chart, lab work & pertinent test results, reviewed documented beta blocker date and time   Airway Mallampati: I  TM Distance: >3 FB Neck ROM: Full    Dental  (+) Dental Advisory Given, Edentulous Upper, Edentulous Lower   Pulmonary COPD,  COPD inhaler, former smoker,    Pulmonary exam normal breath sounds clear to auscultation       Cardiovascular Normal cardiovascular exam+ dysrhythmias Atrial Fibrillation  Rhythm:Regular Rate:Normal  Echo 03/24/16: - The patient was in atrial fibrillation. Normal LV size with EF 60-65%. Normal RV size and systolic function. Moderate pulmonary hypertension. Dilated IVC suggesting elevated RV filling pressure.   Neuro/Psych negative neurological ROS     GI/Hepatic Neg liver ROS, GERD  Medicated,Esophageal stricture s/p multiple dilations Small bowel obstruction   Endo/Other  Hypothyroidism   Renal/GU negative Renal ROS     Musculoskeletal  (+) Arthritis ,   Abdominal   Peds  Hematology  (+) Blood dyscrasia, anemia ,   Anesthesia Other Findings Day of surgery medications reviewed with the patient.  Reproductive/Obstetrics                            Anesthesia Physical Anesthesia Plan  ASA: III  Anesthesia Plan: General   Post-op Pain Management:    Induction: Intravenous, Rapid sequence and Cricoid pressure planned  Airway Management Planned: Oral ETT  Additional Equipment:   Intra-op Plan:   Post-operative Plan: Extubation in OR  Informed Consent: I have reviewed the patients History and Physical, chart, labs and discussed the procedure including the risks, benefits and alternatives for the proposed anesthesia with the patient or authorized representative who has indicated his/her understanding and  acceptance.   Dental advisory given  Plan Discussed with: CRNA  Anesthesia Plan Comments:         Anesthesia Quick Evaluation

## 2016-06-02 NOTE — Op Note (Deleted)
  The note originally documented on this encounter has been moved the the encounter in which it belongs.  

## 2016-06-02 NOTE — Op Note (Signed)
Amy [name], Amy [name] NO.:  [id number]  MEDICAL RECORD NO.:  [id number]  LOCATION:                                 FACILITY:  PHYSICIAN:  Earnstine Regal, MD      DATE OF BIRTH:  [birth year]  DATE OF PROCEDURE:  06/02/2016                               OPERATIVE REPORT   PREOPERATIVE DIAGNOSIS:  Partial small-bowel obstruction.  POSTOPERATIVE DIAGNOSIS:  Small bowel stricture.  PROCEDURE: 1. Diagnostic laparoscopy. 2. Open laparotomy with small-bowel resection and primary anastomosis.  SURGEON:  Armandina Gemma, MD  ANESTHESIA:  General.  ESTIMATED BLOOD LOSS:  Minimal.  PREPARATION:  ChloraPrep.  COMPLICATIONS:  None.  INDICATIONS:  The patient is a 81 year old female referred by Dr. Clarene Essex, from Gastroenterology for evaluation of partial small bowel obstruction.  The patient first developed symptoms in January of 2018. She experienced upper abdominal discomfort after eating a large meal. This was associated with nausea.  It now occurs on a daily basis.  She is eating small meals.  She has lost approximately 12 to 13 pounds over the last 2-3 months.  CT scan of the abdomen demonstrated diffuse distention of the duodenum and proximal jejunum with mucosal thickening. The patient underwent endoscopy by Dr. Watt Climes, which demonstrated an area of partial obstruction in the proximal jejunum.  The patient now comes to Surgery for laparoscopy and possible small bowel resection.  BODY OF REPORT:  Procedure was done in OR #1 at the [facility].  The patient was brought to the operating room, placed in the supine position on the operating room table.  Following administration of general anesthesia, the patient was positioned and then prepped and draped in the usual aseptic fashion.  After ascertaining that an adequate level of anesthesia had been achieved, an incision was made just below the umbilicus and carried down to the fascia.  Fascia  was incised and the peritoneal cavity was entered cautiously.  A #0 Vicryl pursestring suture was placed in the fascia.  A Hasson cannula was introduced under direct vision and secured with a purse-string suture.  Abdomen was insufflated with carbon dioxide. Laparoscope was introduced and the abdomen was explored.  Operative port was placed in the left upper quadrant.  The liver appears grossly normal.  The gallbladder is surgically absent.  Stomach appears normal. Colon appears normal.  Peritoneal surfaces, and omentum appeared normal. The omentum was elevated off the underlying small bowel.  Proximal small bowel was markedly dilated.  There is a transition point in the mid jejunum between dilated small bowel to what appears to be normal caliber small bowel.  This appears to be a benign stricture.  At this point, the procedure was converted to open surgery.  The incision below the umbilicus was extended caudad.  Dissection was carried through subcutaneous tissues and fascia was incised in the midline.  The loop of small bowel was identified and delivered up in through the wound.  There appears to be a focal stricture measuring approximately 1 cm in length.  There was a clear transition point between dilated thickened small bowel and normal distal small bowel. Using GIA  staplers, the bowel was transected proximal and distal to the stricture.  Mesentery was divided between hemostats and ligated with 2-0 silk ties.  Specimen was submitted to Pathology where it was opened and the pathologist felt this would look like a benign stricture.  It will be submitted.  Next, a side-to-side functionally end-to-end anastomosis was created between the proximal and distal jejunum.  This was performed with a GIA stapler.  Enterotomy was closed with a TA-60 stapler.  Mesenteric defect was closed with interrupted 2-0 silk sutures.  Good hemostasis was noted.  Bowel was returned to the peritoneal cavity  and covered with omentum. Gowns and gloves were changed.  Midline wound was closed with interrupted 0 Novafil simple sutures.  Subcutaneous tissues were irrigated.  Skin was closed with stainless steel staples.  A honey-comb dressing was applied.  The patient was awakened from anesthesia and brought to the recovery room.  The patient tolerated the procedure well.    Earnstine Regal, MD, [facility] Office: [phone]   TMG/MEDQ  D:  06/02/2016  T:  06/02/2016  Job:  [id number]  cc:   Jeryl Columbia, M.D. Fax: [phone]

## 2016-06-02 NOTE — Transfer of Care (Signed)
Immediate Anesthesia Transfer of Care Note  Patient: Amy [name]  Procedure(s) Performed: Procedure(s): DIAGNOSTIC LAPAROSCOPY SMALL BOWEL RESECTION (N/A)  Patient Location: PACU  Anesthesia Type:General  Level of Consciousness:  sedated, patient cooperative and responds to stimulation  Airway & Oxygen Therapy:Patient Spontanous Breathing and Patient connected to face mask oxgen  Post-op Assessment:  Report given to PACU RN and Post -op Vital signs reviewed and stable  Post vital signs:  Reviewed and stable  Last Vitals:  Vitals:   06/02/16 0512  BP: (!) 154/62  Pulse: (!) 57  Resp: 18  Temp: 33.3 C    Complications: No apparent anesthesia complications

## 2016-06-03 LAB — CBC
HCT: 25 % — ABNORMAL LOW (ref 36.0–46.0)
HEMATOCRIT: 24.7 % — AB (ref 36.0–46.0)
HEMATOCRIT: 24.3 % — AB (ref 36.0–46.0)
HEMOGLOBIN: 8.1 g/dL — AB (ref 12.0–15.0)
HEMOGLOBIN: 8.3 g/dL — AB (ref 12.0–15.0)
Hemoglobin: 8.1 g/dL — ABNORMAL LOW (ref 12.0–15.0)
MCH: 30.9 pg (ref 26.0–34.0)
MCH: 31.2 pg (ref 26.0–34.0)
MCH: 31.2 pg (ref 26.0–34.0)
MCHC: 32.8 g/dL (ref 30.0–36.0)
MCHC: 33.2 g/dL (ref 30.0–36.0)
MCHC: 33.3 g/dL (ref 30.0–36.0)
MCV: 94.3 fL (ref 78.0–100.0)
MCV: 94 fL (ref 78.0–100.0)
MCV: 93.5 fL (ref 78.0–100.0)
Platelets: 255 10*3/uL (ref 150–400)
Platelets: 283 10*3/uL (ref 150–400)
Platelets: 253 10*3/uL (ref 150–400)
RBC: 2.62 MIL/uL — AB (ref 3.87–5.11)
RBC: 2.66 MIL/uL — ABNORMAL LOW (ref 3.87–5.11)
RBC: 2.6 MIL/uL — AB (ref 3.87–5.11)
RDW: 15.2 % (ref 11.5–15.5)
RDW: 15.5 % (ref 11.5–15.5)
RDW: 15.4 % (ref 11.5–15.5)
WBC: 17.6 10*3/uL — AB (ref 4.0–10.5)
WBC: 19.4 10*3/uL — ABNORMAL HIGH (ref 4.0–10.5)
WBC: 21.6 10*3/uL — AB (ref 4.0–10.5)

## 2016-06-03 LAB — PROTIME-INR
INR: 1.23
Prothrombin Time: 15.6 seconds — ABNORMAL HIGH (ref 11.4–15.2)

## 2016-06-03 LAB — BASIC METABOLIC PANEL
ANION GAP: 7 (ref 5–15)
BUN: 19 mg/dL (ref 6–20)
CO2: 31 mmol/L (ref 22–32)
Calcium: 7.9 mg/dL — ABNORMAL LOW (ref 8.9–10.3)
Chloride: 102 mmol/L (ref 101–111)
Creatinine, Ser: 1.02 mg/dL — ABNORMAL HIGH (ref 0.44–1.00)
GFR calc non Af Amer: 51 mL/min — ABNORMAL LOW (ref >60)
GFR, EST AFRICAN AMERICAN: 59 mL/min — AB (ref >60)
Glucose, Bld: 179 mg/dL — ABNORMAL HIGH (ref 65–99)
POTASSIUM: 4 mmol/L (ref 3.5–5.1)
SODIUM: 140 mmol/L (ref 135–145)

## 2016-06-03 LAB — MRSA PCR SCREENING: MRSA by PCR: NEGATIVE

## 2016-06-03 MED ORDER — PANTOPRAZOLE SODIUM 40 MG IV SOLR
40.0000 mg | Freq: Two times a day (BID) | INTRAVENOUS | Status: DC
  Administered 2016-06-03 – 2016-06-05 (×6): 40 mg via INTRAVENOUS
  Filled 2016-06-03 (×6): qty 40

## 2016-06-03 MED ORDER — SODIUM CHLORIDE 0.9 % IV BOLUS (SEPSIS)
1000.0000 mL | Freq: Once | INTRAVENOUS | Status: AC
  Administered 2016-06-03: 1000 mL via INTRAVENOUS

## 2016-06-03 MED ORDER — MORPHINE SULFATE (PF) 2 MG/ML IV SOLN
1.0000 mg | INTRAVENOUS | Status: DC | PRN
Start: 2016-06-03 — End: 2016-06-06
  Administered 2016-06-03 – 2016-06-05 (×9): 2 mg via INTRAVENOUS
  Administered 2016-06-05: 1 mg via INTRAVENOUS
  Filled 2016-06-03 (×2): qty 1
  Filled 2016-06-03: qty 2
  Filled 2016-06-03 (×7): qty 1

## 2016-06-03 MED ORDER — SODIUM CHLORIDE 0.9 % IV BOLUS (SEPSIS)
500.0000 mL | Freq: Once | INTRAVENOUS | Status: AC
  Administered 2016-06-03: 500 mL via INTRAVENOUS

## 2016-06-03 MED ORDER — SODIUM CHLORIDE 0.9 % IV BOLUS (SEPSIS)
500.0000 mL | Freq: Once | INTRAVENOUS | Status: AC
  Administered 2016-06-03: 04:00:00 500 mL via INTRAVENOUS

## 2016-06-03 MED ORDER — PHENOL 1.4 % MT LIQD
1.0000 | OROMUCOSAL | Status: DC | PRN
  Administered 2016-06-03: 1 via OROMUCOSAL
  Filled 2016-06-03: qty 177

## 2016-06-03 NOTE — Progress Notes (Signed)
[facility] Office:  [phone] General Surgery Progress Note   LOS: 1 day  POD -  1 Day Post-Op  Chief Complaint: Bowel obstruction  Assessment and Plan: 1.  DIAGNOSTIC LAPAROSCOPY, SMALL BOWEL RESECTION - 06/02/2016 - [facility]  For bowel obstruction  Soft BP last PM and Hgb down - moved to ICU  Looks okay today  Will follow Hgb and continue NGT  2.  Anemia  Hgb - 8.1 - 06/03/2016 3.  Foley  Will leave for now  4.  DVT prophylaxis - on hold for bleeding   Principal Problem:   Small bowel obstruction, partial (HCC) Active Problems:   Partial small bowel obstruction (HCC)   Subjective:  Doing okay.  Wants phone to work so she can call her family  Objective:   Vitals:   06/03/16 0500 06/03/16 0622  BP: (!) 97/47   Pulse: 83   Resp: 20   Temp: 98.4 F (36.9 C) 98 F (36.7 C)     Intake/Output from previous day:  05/25 0701 - 05/26 0700 In: 1722.5 [I.V.:1662.5; NG/GT:60] Out: 2710 [Urine:1850; Emesis/NG output:850; Blood:10]  Intake/Output this shift:  No intake/output data recorded.   Physical Exam:   General: Older WF who is alert and oriented.    HEENT: Normal. Pupils equal. .   Lungs: Clear   Abdomen: Soft, quiet.   Wound: Clean   Lab Results:    Recent Labs  06/03/16 0341  WBC 17.6*  HGB 8.1*  HCT 24.7*  PLT 255    BMET   Recent Labs  06/03/16 0341  NA 140  K 4.0  CL 102  CO2 31  GLUCOSE 179*  BUN 19  CREATININE 1.02*  CALCIUM 7.9*    PT/INR  No results for input(s): LABPROT, INR in the last 72 hours.  ABG  No results for input(s): PHART, HCO3 in the last 72 hours.  Invalid input(s): PCO2, PO2   Studies/Results:  No results found.   Anti-infectives:   Anti-infectives    Start     Dose/Rate Route Frequency Ordered Stop   06/02/16 0508  ertapenem [facility]) 1 g in sodium chloride 0.9 % 50 mL IVPB     1 g 100 mL/hr over 30 Minutes Intravenous On call to O.R. 06/02/16 0508 06/02/16 0805      Alphonsa Overall, MD,  FACS Pager: [facility] Office: [phone] 06/03/2016

## 2016-06-03 NOTE — Progress Notes (Signed)
Patient transferred to step down ICU.

## 2016-06-03 NOTE — Progress Notes (Signed)
500 cc bolus done v/s as follows Bp- 97/47 , Temp. 98.4 Pulse- 83 , O2 sat- 98 2L O2 , RR- 24 . Dr. Donne Hazel was notified and aware of the new lab results. We will continue to monitor.

## 2016-06-03 NOTE — Progress Notes (Signed)
Nurse paged Dr. Donne Hazel concerning NG tube drainage and VS. Dr. Donne Hazel is aware of patients VS as follows: 98.0 Oral, P 80, BP 101/40, 100% 2LO2. Dr. Donne Hazel aware aware of red drainage in NG tube and 57ml urine in foley bag. Nurse entered verbal orders from Dr. Donne Hazel as follows: 531ml NS bolus, BMP/CBC STAT. Bolus is currently running, lab to draw labs.

## 2016-06-03 NOTE — Progress Notes (Signed)
Patient ID: Amy [name], female   DOB: May 05, 1935, 81 y.o.   MRN: [id number] Some bloody drainage from ng does not appear to be large volume, bp on low side with low uop, labs with decreased hct.  Can only see brief op note and appears had sbr.  Will increase protonix, recheck labs and place in monitored setting

## 2016-06-04 LAB — BASIC METABOLIC PANEL
ANION GAP: 3 — AB (ref 5–15)
BUN: 20 mg/dL (ref 6–20)
CALCIUM: 7.7 mg/dL — AB (ref 8.9–10.3)
CO2: 29 mmol/L (ref 22–32)
Chloride: 108 mmol/L (ref 101–111)
Creatinine, Ser: 0.65 mg/dL (ref 0.44–1.00)
GFR calc non Af Amer: >60 mL/min (ref >60)
GLUCOSE: 111 mg/dL — AB (ref 65–99)
POTASSIUM: 4.4 mmol/L (ref 3.5–5.1)
Sodium: 140 mmol/L (ref 135–145)

## 2016-06-04 LAB — CBC WITH DIFFERENTIAL/PLATELET
BASOS ABS: 0 10*3/uL (ref 0.0–0.1)
BASOS PCT: 0 %
Eosinophils Absolute: 0 10*3/uL (ref 0.0–0.7)
Eosinophils Relative: 0 %
HEMATOCRIT: 19.2 % — AB (ref 36.0–46.0)
HEMOGLOBIN: 6.3 g/dL — AB (ref 12.0–15.0)
LYMPHS PCT: 33 %
Lymphs Abs: 4.3 10*3/uL — ABNORMAL HIGH (ref 0.7–4.0)
MCH: 31 pg (ref 26.0–34.0)
MCHC: 32.8 g/dL (ref 30.0–36.0)
MCV: 94.6 fL (ref 78.0–100.0)
MONOS PCT: 7 %
Monocytes Absolute: 0.9 10*3/uL (ref 0.1–1.0)
NEUTROS ABS: 7.8 10*3/uL — AB (ref 1.7–7.7)
NEUTROS PCT: 60 %
Platelets: 205 10*3/uL (ref 150–400)
RBC: 2.03 MIL/uL — ABNORMAL LOW (ref 3.87–5.11)
RDW: 15.8 % — ABNORMAL HIGH (ref 11.5–15.5)
WBC: 13.1 10*3/uL — ABNORMAL HIGH (ref 4.0–10.5)

## 2016-06-04 LAB — CBC
HCT: 30.6 % — ABNORMAL LOW (ref 36.0–46.0)
HEMOGLOBIN: 10.1 g/dL — AB (ref 12.0–15.0)
MCH: 30.1 pg (ref 26.0–34.0)
MCHC: 33 g/dL (ref 30.0–36.0)
MCV: 91.1 fL (ref 78.0–100.0)
Platelets: 223 10*3/uL (ref 150–400)
RBC: 3.36 MIL/uL — ABNORMAL LOW (ref 3.87–5.11)
RDW: 16.3 % — ABNORMAL HIGH (ref 11.5–15.5)
WBC: 14.7 10*3/uL — ABNORMAL HIGH (ref 4.0–10.5)

## 2016-06-04 LAB — PREPARE RBC (CROSSMATCH)

## 2016-06-04 MED ORDER — SODIUM CHLORIDE 0.9 % IV SOLN: Freq: Once | INTRAVENOUS | Status: AC

## 2016-06-04 MED ORDER — SODIUM CHLORIDE 0.9 % IV SOLN
Freq: Once | INTRAVENOUS | Status: AC
  Administered 2016-06-04: 10 mL/h via INTRAVENOUS

## 2016-06-04 NOTE — Progress Notes (Signed)
[facility] Office:  [phone] General Surgery Progress Note   LOS: 2 days  POD -  2 Days Post-Op  Chief Complaint: Bowel obstruction  Assessment and Plan: 1.  DIAGNOSTIC LAPAROSCOPY, SMALL BOWEL RESECTION - 06/02/2016 - [facility]  For bowel obstruction secondary to benign stricutre  She looks good looking at her.  Her continued drop in hgb may be partially dilutional, but will leave in ICU because of continued decreasing Hgb.  Will plan repeat labs this afternoon.  I spoke to daughter, Campbell Lerner by phone.   2.  Anemia  Hgb - 6.3 - 06/04/2016  To transfuse 2 units. 3.  Foley  Will leave for now  4.  DVT prophylaxis - on hold for bleeding   Principal Problem:   Small bowel obstruction, partial (HCC) Active Problems:   Partial small bowel obstruction (HCC)   Subjective:  Looks okay.  She says that he abdominal pain is not bad.  Objective:   Vitals:   06/04/16 0531 06/04/16 0600  BP:  (!) 105/41  Pulse:  84  Resp:  10  Temp: 98.2 F (36.8 C)      Intake/Output from previous day:  05/26 0701 - 05/27 0700 In: 2776.7 [I.V.:2726.7; NG/GT:50] Out: 6712 [WPYKD:9833; Emesis/NG output:450]  Intake/Output this shift:  No intake/output data recorded.   Physical Exam:   General: Older WF who is alert and oriented.  NGT output dark.   HEENT: Normal. Pupils equal. .   Lungs: Clear   Abdomen: Soft.  She has BS.   Wound: Clean   Lab Results:     Recent Labs  06/03/16 1348 06/04/16 0537  WBC 21.6* 13.1*  HGB 8.1* 6.3*  HCT 24.3* 19.2*  PLT 253 205    BMET    Recent Labs  06/03/16 0341 06/04/16 0537  NA 140 140  K 4.0 4.4  CL 102 108  CO2 31 29  GLUCOSE 179* 111*  BUN 19 20  CREATININE 1.02* 0.65  CALCIUM 7.9* 7.7*    PT/INR    Recent Labs  06/03/16 0834  LABPROT 15.6*  INR 1.23    ABG  No results for input(s): PHART, HCO3 in the last 72 hours.  Invalid input(s): PCO2, PO2   Studies/Results:  No results  found.   Anti-infectives:   Anti-infectives    Start     Dose/Rate Route Frequency Ordered Stop   06/02/16 0508  ertapenem [facility]) 1 g in sodium chloride 0.9 % 50 mL IVPB     1 g 100 mL/hr over 30 Minutes Intravenous On call to O.R. 06/02/16 0508 06/03/16 1033      Alphonsa Overall, MD, FACS Pager: [facility] Office: [phone] 06/04/2016

## 2016-06-04 NOTE — Progress Notes (Signed)
Initial Nutrition Assessment  DOCUMENTATION CODES:   Non-severe (moderate) malnutrition in context of chronic illness  INTERVENTION:   RD will order supplements when diet advanced. Pt likes Boost Breeze but prefers vanilla or chocolate Ensure.   Recommend nutrition support if unable to advance diet in 1-2 days. Pt at moderate risk for refeeding.   NUTRITION DIAGNOSIS:   Malnutrition (moderate) related to chronic illness, SBO as evidenced by moderate depletion of body fat, moderate depletions of muscle mass, energy intake < or equal to 75% for > or equal to 1 month.  GOAL:   Patient will meet greater than or equal to 90% of their needs  MONITOR:   Diet advancement, Labs, Weight trends, I & O's  REASON FOR ASSESSMENT:   Malnutrition Screening Tool    ASSESSMENT:   Patient is referred by Dr. Clarene Essex for evaluation of partial proximal jejunal obstruction.  Patient first developed symptoms in January 2018.  She experiences upper abdominal discomfort after eating a large meal.  This is associated with nausea.  It happens on a daily basis.  Patient has been eating small meals in order to control her symptoms.  She has lost 12-13 pounds over the past 2-3 months.  Evaluation has included a CT scan of the abdomen on April 25, 2016 which showed diffuse distention of the duodenum and proximal jejunum with mucosal thickening representing an inflammatory process.  Patient underwent endoscopy by Dr. Watt Climes which also demonstrated an area of partial obstruction in the proximal jejunum.  Pt now s/p DIAGNOSTIC LAPAROSCOPY, SMALL BOWEL RESECTION - 06/02/2016   Met with pt in room today. Pt reports that she has not been eating well for over 1 month now and that she has early feelings of fullness and nausea. Pt reports that these symptoms started around January but have progressively gotten worse. Pt reports that she has lost 12-13lbs in 2 months; per chart pt appears weight stable since January. Pt  currently NPO with NGT in place. Pt likes Boost Breeze but prefers vanilla or chocolate Ensure; RD will order supplements when diet advanced. Recommend nutrition support if unable to advance diet in 1-2 days; pt at moderate risk for refeeding.    Medications reviewed and include: protonix, morphine  Labs reviewed: Ca 7.7(L) Wbc- 13.1(H), Hgb 6.3(L), Hct 19.2(L)  Nutrition-Focused physical exam completed. Findings are moderate fat depletion in orbital region, chest, and upper arms, moderate to severe muscle depletion over entire body, and no edema.   Diet Order:  Diet NPO time specified Except for: Ice Chips, Sips with Meds  Skin:  Wound (see comment) (incision abdomen )  Last BM:  5/27  Height:   Ht Readings from Last 1 Encounters:  06/03/16 '5\' 5"'  (1.651 m)    Weight:   Wt Readings from Last 1 Encounters:  06/04/16 125 lb 14.1 oz (57.1 kg)    Ideal Body Weight:  56.8 kg  BMI:  Body mass index is 20.95 kg/m.  Estimated Nutritional Needs:   Kcal:  1500-1800kcal/day   Protein:  74-81g/day   Fluid:  >1.5L/day   EDUCATION NEEDS:   No education needs identified at this time  Koleen Distance MS, RD, LDN Pager #- [phone]

## 2016-06-04 NOTE — Anesthesia Postprocedure Evaluation (Signed)
Anesthesia Post Note  Patient: ELEXIS POLLAK  Procedure(s) Performed: Procedure(s) (LRB): DIAGNOSTIC LAPAROSCOPY SMALL BOWEL RESECTION (N/A)  Patient location during evaluation: PACU Anesthesia Type: General Level of consciousness: awake and alert Pain management: pain level controlled Vital Signs Assessment: post-procedure vital signs reviewed and stable Respiratory status: spontaneous breathing, nonlabored ventilation, respiratory function stable and patient connected to nasal cannula oxygen Cardiovascular status: blood pressure returned to baseline and stable Postop Assessment: no signs of nausea or vomiting Anesthetic complications: no       Last Vitals:  Vitals:   06/04/16 1700 06/04/16 1800  BP:  (!) 135/39  Pulse: 81 94  Resp: 12 (!) 25  Temp:      Last Pain:  Vitals:   06/04/16 1600  TempSrc: Oral  PainSc:                  Catalina Gravel

## 2016-06-05 LAB — BASIC METABOLIC PANEL
ANION GAP: 3 — AB (ref 5–15)
BUN: 10 mg/dL (ref 6–20)
CHLORIDE: 105 mmol/L (ref 101–111)
CO2: 31 mmol/L (ref 22–32)
Calcium: 8.3 mg/dL — ABNORMAL LOW (ref 8.9–10.3)
Creatinine, Ser: 0.62 mg/dL (ref 0.44–1.00)
GFR calc non Af Amer: >60 mL/min (ref >60)
Glucose, Bld: 118 mg/dL — ABNORMAL HIGH (ref 65–99)
Potassium: 4.1 mmol/L (ref 3.5–5.1)
Sodium: 139 mmol/L (ref 135–145)

## 2016-06-05 LAB — CBC WITH DIFFERENTIAL/PLATELET
BASOS ABS: 0 10*3/uL (ref 0.0–0.1)
BASOS PCT: 0 %
Eosinophils Absolute: 0.4 10*3/uL (ref 0.0–0.7)
Eosinophils Relative: 4 %
HEMATOCRIT: 27.9 % — AB (ref 36.0–46.0)
HEMOGLOBIN: 9 g/dL — AB (ref 12.0–15.0)
Lymphocytes Relative: 41 %
Lymphs Abs: 4.2 10*3/uL — ABNORMAL HIGH (ref 0.7–4.0)
MCH: 29.7 pg (ref 26.0–34.0)
MCHC: 32.3 g/dL (ref 30.0–36.0)
MCV: 92.1 fL (ref 78.0–100.0)
Monocytes Absolute: 0.9 10*3/uL (ref 0.1–1.0)
Monocytes Relative: 9 %
NEUTROS ABS: 4.6 10*3/uL (ref 1.7–7.7)
NEUTROS PCT: 46 %
Platelets: 204 10*3/uL (ref 150–400)
RBC: 3.03 MIL/uL — AB (ref 3.87–5.11)
RDW: 16.8 % — ABNORMAL HIGH (ref 11.5–15.5)
WBC: 10.1 10*3/uL (ref 4.0–10.5)

## 2016-06-05 MED ORDER — DIPHENHYDRAMINE HCL 50 MG/ML IJ SOLN
25.0000 mg | Freq: Three times a day (TID) | INTRAMUSCULAR | Status: DC | PRN
  Administered 2016-06-05 (×2): 25 mg via INTRAVENOUS
  Filled 2016-06-05 (×2): qty 1

## 2016-06-05 MED ORDER — ORAL CARE MOUTH RINSE
15.0000 mL | Freq: Two times a day (BID) | OROMUCOSAL | Status: DC
  Administered 2016-06-05 (×2): 15 mL via OROMUCOSAL

## 2016-06-05 MED ORDER — CHLORHEXIDINE GLUCONATE 0.12 % MT SOLN
15.0000 mL | Freq: Two times a day (BID) | OROMUCOSAL | Status: DC
  Administered 2016-06-05 – 2016-06-06 (×4): 15 mL via OROMUCOSAL
  Filled 2016-06-05 (×3): qty 15

## 2016-06-05 MED ORDER — KCL IN DEXTROSE-NACL 20-5-0.45 MEQ/L-%-% IV SOLN
INTRAVENOUS | Status: DC
  Administered 2016-06-05: 13:00:00 via INTRAVENOUS
  Administered 2016-06-06: 50 mL/h via INTRAVENOUS
  Filled 2016-06-05 (×5): qty 1000

## 2016-06-05 NOTE — Care Management Note (Signed)
Case Management Note  Patient Details  Name: Amy [name] MRN: [id number] Date of Birth: [birth year]  Subjective/Objective:                  Chief Complaint: Bowel obstruction  Assessment and Plan: 1.  DIAGNOSTIC LAPAROSCOPY, SMALL BOWEL RESECTION - 06/02/2016 - [facility]             For bowel obstruction secondary to benign stricutre             Continues to look good - passed flatus.  BP stable.             Plan - remove NGT and transfer to floor          Action/Plan: Lives alone at home Date:  Jun 05, 2016 Chart reviewed for concurrent status and case management needs. Will continue to follow patient progress. Discharge Planning: following for needs Expected discharge date: [id number] Velva Harman, BSN, [place], [place] Expected Discharge Date:                  Expected Discharge Plan:  Home/Self Care  In-House Referral:     Discharge planning Services  CM Consult  Post Acute Care Choice:    Choice offered to:     DME Arranged:    DME Agency:     HH Arranged:    HH Agency:     Status of Service:  In process, will continue to follow  If discussed at Long Length of Stay Meetings, dates discussed:    Additional Comments:  Leeroy Cha, RN 06/05/2016, 8:38 AM

## 2016-06-05 NOTE — Progress Notes (Signed)
[facility] Office:  [phone] General Surgery Progress Note   LOS: 3 days  POD -  3 Days Post-Op  Chief Complaint: Bowel obstruction  Assessment and Plan: 1.  DIAGNOSTIC LAPAROSCOPY, SMALL BOWEL RESECTION - 06/02/2016 - [facility]  For bowel obstruction secondary to benign stricutre  Continues to look good - passed flatus.  BP stable.  Plan - remove NGT and transfer to floor  2.  Anemia  Hgb - 9.0 - 06/05/2016 (ater two units PRBC)  3.  Foley  Remove today 4.  DVT prophylaxis - on hold for bleeding   Principal Problem:   Small bowel obstruction, partial (HCC) Active Problems:   Partial small bowel obstruction (HCC)   Subjective:  Looks okay.  Passed flatus.  Sat out of bed yesterday.  Objective:   Vitals:   06/05/16 0400 06/05/16 0600  BP: (!) 130/56 (!) 146/53  Pulse: 77 90  Resp: 13 19  Temp:       Intake/Output from previous day:  05/27 0701 - 05/28 0700 In: 3765.4 [I.V.:2468.8; Blood:776.7; NG/GT:520] Out: 3050 [Urine:3050]  Intake/Output this shift:  No intake/output data recorded.   Physical Exam:   General: Older WF who is alert and oriented.  NGT removed.   HEENT: Normal. Pupils equal. .   Lungs: Clear   Abdomen: Soft.  She has BS.   Wound: Clean   Lab Results:     Recent Labs  06/04/16 1458 06/05/16 0326  WBC 14.7* 10.1  HGB 10.1* 9.0*  HCT 30.6* 27.9*  PLT 223 204    BMET    Recent Labs  06/04/16 0537 06/05/16 0326  NA 140 139  K 4.4 4.1  CL 108 105  CO2 29 31  GLUCOSE 111* 118*  BUN 20 10  CREATININE 0.65 0.62  CALCIUM 7.7* 8.3*    PT/INR    Recent Labs  06/03/16 0834  LABPROT 15.6*  INR 1.23    ABG  No results for input(s): PHART, HCO3 in the last 72 hours.  Invalid input(s): PCO2, PO2   Studies/Results:  No results found.   Anti-infectives:   Anti-infectives    Start     Dose/Rate Route Frequency Ordered Stop   06/02/16 0508  ertapenem [facility]) 1 g in sodium chloride 0.9 % 50 mL IVPB     1  g 100 mL/hr over 30 Minutes Intravenous On call to O.R. 06/02/16 0508 06/03/16 1033      Alphonsa Overall, MD, FACS Pager: [facility] Office: [phone] 06/05/2016

## 2016-06-06 ENCOUNTER — Encounter (HOSPITAL_COMMUNITY): Payer: Self-pay | Admitting: Surgery

## 2016-06-06 LAB — CBC WITH DIFFERENTIAL/PLATELET
BASOS PCT: 0 %
Basophils Absolute: 0 10*3/uL (ref 0.0–0.1)
EOS ABS: 0.4 10*3/uL (ref 0.0–0.7)
EOS PCT: 5 %
HCT: 28 % — ABNORMAL LOW (ref 36.0–46.0)
Hemoglobin: 9.2 g/dL — ABNORMAL LOW (ref 12.0–15.0)
LYMPHS ABS: 2.5 10*3/uL (ref 0.7–4.0)
Lymphocytes Relative: 33 %
MCH: 30.1 pg (ref 26.0–34.0)
MCHC: 32.9 g/dL (ref 30.0–36.0)
MCV: 91.5 fL (ref 78.0–100.0)
MONO ABS: 0.8 10*3/uL (ref 0.1–1.0)
MONOS PCT: 10 %
Neutro Abs: 4 10*3/uL (ref 1.7–7.7)
Neutrophils Relative %: 52 %
Platelets: 212 10*3/uL (ref 150–400)
RBC: 3.06 MIL/uL — ABNORMAL LOW (ref 3.87–5.11)
RDW: 16.2 % — AB (ref 11.5–15.5)
WBC: 7.7 10*3/uL (ref 4.0–10.5)

## 2016-06-06 LAB — TYPE AND SCREEN
ABO/RH(D): O POS
ANTIBODY SCREEN: NEGATIVE
UNIT DIVISION: 0
Unit division: 0
Unit division: 0

## 2016-06-06 LAB — BPAM RBC
BLOOD PRODUCT EXPIRATION DATE: 201806152359
BLOOD PRODUCT EXPIRATION DATE: 201806202359
BLOOD PRODUCT EXPIRATION DATE: 201806202359
ISSUE DATE / TIME: 201805270728
ISSUE DATE / TIME: 201805270957
UNIT TYPE AND RH: 5100
UNIT TYPE AND RH: 5100
Unit Type and Rh: 5100

## 2016-06-06 MED ORDER — METOPROLOL TARTRATE 12.5 MG HALF TABLET
12.5000 mg | ORAL_TABLET | Freq: Two times a day (BID) | ORAL | Status: DC
  Administered 2016-06-06 – 2016-06-07 (×3): 12.5 mg via ORAL
  Filled 2016-06-06 (×3): qty 1

## 2016-06-06 MED ORDER — LEVOTHYROXINE SODIUM 75 MCG PO TABS
75.0000 ug | ORAL_TABLET | Freq: Every day | ORAL | Status: DC
  Administered 2016-06-06 – 2016-06-07 (×2): 75 ug via ORAL
  Filled 2016-06-06 (×2): qty 1

## 2016-06-06 MED ORDER — ALPRAZOLAM 0.25 MG PO TABS
0.2500 mg | ORAL_TABLET | Freq: Two times a day (BID) | ORAL | Status: DC
  Administered 2016-06-06 – 2016-06-07 (×3): 0.25 mg via ORAL
  Filled 2016-06-06 (×3): qty 1

## 2016-06-06 NOTE — Progress Notes (Signed)
  General Surgery [facility]  Assessment & Plan: POD#4 - status post small bowel resection  Resolving ileus - passing flatus and having BM's  Begin clear liquid diet - advance as tolerated  Resume home meds  OOB, ambulate in halls  Anemia  Likely intra-abdominal bleeding after small bowel resection  Hgb now stable        Earnstine Regal, MD, [facility]       Office: [phone]    Chief Complaint: Status post small bowel resection  Subjective: Patient in bed, comfortable, denies pain.  "Tired".  No nausea or emesis.  Passing flatus and having BM's.  Objective: Vital signs in last 24 hours: Temp:  [97.8 F (36.6 C)-98.4 F (36.9 C)] 97.8 F (36.6 C) (05/29 0536) Pulse Rate:  [79-115] 79 (05/29 0536) Resp:  [14-20] 18 (05/29 0536) BP: (122-175)/(67-81) 153/68 (05/29 0536) SpO2:  [94 %-98 %] 96 % (05/29 0536) Last BM Date: 06/05/16  Intake/Output from previous day: 05/28 0701 - 05/29 0700 In: 1466.7 [I.V.:1466.7] Out: 2452 [Urine:2100; Emesis/NG output:350; Stool:2] Intake/Output this shift: No intake/output data recorded.  Physical Exam: HEENT - sclerae clear, mucous membranes moist Neck - soft Chest - clear bilaterally Cor - RRR Abdomen - soft, mild distension; active BS present; midline wound dry and intact with honeycomb dressing Ext - no edema, non-tender Neuro - alert & oriented, no focal deficits  Lab Results:   Recent Labs  06/05/16 0326 06/06/16 0438  WBC 10.1 7.7  HGB 9.0* 9.2*  HCT 27.9* 28.0*  PLT 204 212   BMET  Recent Labs  06/04/16 0537 06/05/16 0326  NA 140 139  K 4.4 4.1  CL 108 105  CO2 29 31  GLUCOSE 111* 118*  BUN 20 10  CREATININE 0.65 0.62  CALCIUM 7.7* 8.3*   PT/INR  Recent Labs  06/03/16 0834  LABPROT 15.6*  INR 1.23   Comprehensive Metabolic Panel:    Component Value Date/Time   NA 139 06/05/2016 0326   NA 140 06/04/2016 0537   K 4.1 06/05/2016 0326   K 4.4 06/04/2016 0537   CL 105 06/05/2016 0326   CL 108 06/04/2016 0537   CO2 31 06/05/2016 0326   CO2 29 06/04/2016 0537   BUN 10 06/05/2016 0326   BUN 20 06/04/2016 0537   CREATININE 0.62 06/05/2016 0326   CREATININE 0.65 06/04/2016 0537   GLUCOSE 118 (H) 06/05/2016 0326   GLUCOSE 111 (H) 06/04/2016 0537   CALCIUM 8.3 (L) 06/05/2016 0326   CALCIUM 7.7 (L) 06/04/2016 0537   AST 24 04/25/2016 2121   AST 22 04/03/2016 1532   ALT 15 04/25/2016 2121   ALT 12 (L) 04/03/2016 1532   ALKPHOS 81 04/25/2016 2121   ALKPHOS 98 04/03/2016 1532   BILITOT 0.4 04/25/2016 2121   BILITOT 0.3 04/03/2016 1532   PROT 6.4 (L) 04/25/2016 2121   PROT 7.5 04/03/2016 1532   ALBUMIN 3.2 (L) 04/25/2016 2121   ALBUMIN 3.2 (L) 04/03/2016 1532    Studies/Results: No results found.    Amy [name] 06/06/2016  Patient ID: Amy [name], female   DOB: 04-08-1935, 81 y.o.   MRN: [id number]

## 2016-06-07 NOTE — Progress Notes (Signed)
Patient complaints of 3 loose stools since solid food. Dr Harlow Asa notified with orders received. Bethann Punches RN

## 2016-06-07 NOTE — Discharge Summary (Signed)
Physician Discharge Summary [facility]  Patient ID: Amy [name] MRN: [id number] DOB/AGE: 1935-02-17 81 y.o.  Admit date: 06/02/2016 Discharge date: 06/07/2016  Admission Diagnoses:  Partial small bowel obstruction  Discharge Diagnoses:  Principal Problem:   Small bowel obstruction, partial ([place]) Active Problems:   Partial small bowel obstruction [facility])   Discharged Condition: good  Hospital Course: Patient was admitted for observation following small bowel surgery.  Post op course was complicated by acute blood loss, likely intra-abdominal, requiring transfusion.  Patient stabilized and transferred to floor.  Pain was well controlled.  Tolerated advancing diet. Patient was prepared for discharge home on POD#5.  Consults: None  Treatments: surgery: small bowel resection, diagnostic laparoscopy  Discharge Exam: Blood pressure (!) 131/58, pulse 93, temperature 98.5 F (36.9 C), temperature source Oral, resp. rate 16, height 5\' 5"  (1.651 m), weight 57.1 kg (125 lb 14.1 oz), SpO2 96 %. HEENT - clear Neck - soft Chest - clear bilaterally Cor - RRR Abd - midline wound dry and intact; soft; active BS  Disposition: Home  Discharge Instructions    Diet - low sodium heart healthy    Complete by:  As directed    Discharge instructions    Complete by:  As directed    [facility]  OPEN ABDOMINAL SURGERY: POST OP INSTRUCTIONS  Always review your discharge instruction sheet given to you by the facility where your surgery was performed.  A prescription for pain medication may be given to you upon discharge.  Take your pain medication as prescribed.  If narcotic pain medicine is not needed, then you may take acetaminophen (Tylenol) or ibuprofen (Advil) as needed. Take your usually prescribed medications unless otherwise directed. If you need a refill on your pain medication, please contact your pharmacy. They will contact our office to request  authorization.  Prescriptions will not be filled after 5 pm or on weekends. You should follow a light diet the first few days after arrival home, such as soup and crackers, unless your doctor has advised otherwise. A high-fiber, low fat diet can be resumed as tolerated.  Be sure to include plenty of fluids daily.  Most patients will experience some swelling and bruising in the area of the incision. Ice packs will help. Swelling and bruising can take several days to resolve. It is common to experience some constipation if taking pain medication after surgery.  Increasing fluid intake and taking a stool softener will usually help or prevent this problem from occurring.  A mild laxative (Milk of Magnesia or Miralax) should be taken according to package directions if there are no bowel movements after 48 hours.  You may have steri-strips (small skin tapes) in place directly over the incision.  These strips should be left on the skin for 5-7 days.  Any sutures or staples will be removed at the office during your follow-up visit. You may find that a light gauze bandage over your incision may keep your staples from being rubbed or pulled. You may shower and replace the bandage daily. ACTIVITIES:  You may resume regular (light) daily activities beginning the next day - such as daily self-care, walking, climbing stairs - gradually increasing activities as tolerated.  You may have sexual intercourse when it is comfortable.  Refrain from any heavy lifting or straining until approved by your doctor.  You may drive when you no longer are taking prescription pain medication, you can comfortably wear a seatbelt, and you can safely maneuver  your car and apply brakes. You should see your doctor in the office for a follow-up appointment approximately 2-3 weeks after your surgery.  Make sure that you call for this appointment within a day or two after you arrive home to insure a convenient appointment time.  WHEN TO CALL YOUR  DOCTOR: Fever greater than 101.0 Inability to urinate Persistent nausea and/or vomiting Extreme swelling or bruising Continued bleeding from incision Increased pain, redness, or drainage from the incision Difficulty swallowing or breathing Muscle cramping or spasms Numbness or tingling in hands or around lips  IF YOU HAVE DISABILITY OR FAMILY LEAVE FORMS, YOU MUST BRING THEM TO THE OFFICE FOR PROCESSING.  PLEASE DO NOT GIVE THEM TO YOUR DOCTOR.  The clinic staff is available to answer your questions during regular business hours.  Please don't hesitate to call and ask to speak to one of the nurses if you have concerns.  [facility] [place] Office: [phone]  For further questions, please visit [email or web address]   Increase activity slowly    Complete by:  As directed    No dressing needed    Complete by:  As directed      Allergies as of 06/07/2016      Reactions   Codeine Other (See Comments)   Reaction:  Hallucinations      Medication List    TAKE these medications   ALPRAZolam 0.25 MG tablet Commonly known as:  XANAX Take 0.25 mg by mouth 2 (two) times daily.   aspirin EC 81 MG tablet Take 81 mg by mouth at bedtime.   atorvastatin 20 MG tablet Commonly known as:  LIPITOR Take 20 mg by mouth at bedtime.   budesonide-formoterol 160-4.5 MCG/ACT inhaler Commonly known as:  SYMBICORT Inhale 2 puffs into the lungs 2 (two) times daily as needed (for shortness of breath).   fluticasone 50 MCG/ACT nasal spray Commonly known as:  FLONASE Place 1-2 sprays into both nostrils daily as needed for rhinitis.   HYDROcodone-acetaminophen 5-325 MG tablet Commonly known as:  NORCO/VICODIN Take 1 tablet by mouth every 4 (four) hours as needed.   hydroxypropyl methylcellulose / hypromellose 2.5 % ophthalmic solution Commonly known as:  ISOPTO TEARS / GONIOVISC Place 1-2 drops into both eyes 3 (three) times daily as needed (to soothe dry eyes.).    levothyroxine 75 MCG tablet Commonly known as:  SYNTHROID, LEVOTHROID Take 1 tablet (75 mcg total) by mouth daily before breakfast.   metoprolol tartrate 25 MG tablet Commonly known as:  LOPRESSOR Take 0.5 tablets (12.5 mg total) by mouth 2 (two) times daily.   mirtazapine 15 MG tablet Commonly known as:  REMERON Take 15 mg by mouth at bedtime.   ondansetron 4 MG disintegrating tablet Commonly known as:  ZOFRAN ODT Take 1 tablet (4 mg total) by mouth every 8 (eight) hours as needed for nausea or vomiting.   pantoprazole 40 MG tablet Commonly known as:  PROTONIX Take 40 mg by mouth 2 (two) times daily.   Potassium 99 MG Tabs Take 99 mg by mouth at bedtime.   promethazine 12.5 MG tablet Commonly known as:  PHENERGAN Take 12.5 mg by mouth every 8 (eight) hours as needed for nausea.   traMADol 50 MG tablet Commonly known as:  ULTRAM Take 50 mg by mouth 2 (two) times daily.   Vitamin D3 2000 units Tabs Take 2,000 Units by mouth at bedtime.      Follow-up Information    Armandina Gemma, MD. Schedule an appointment as  soon as possible for a visit in 1 week(s).   Specialty:  General Surgery Contact information: [address] [place] [address] [phone]           Earnstine Regal, MD, [facility] Office: [phone]   Signed: Earnstine Regal 06/07/2016, 7:38 AM

## 2017-02-03 ENCOUNTER — Encounter (HOSPITAL_COMMUNITY): Payer: Self-pay | Admitting: Emergency Medicine

## 2017-02-03 ENCOUNTER — Other Ambulatory Visit: Payer: Self-pay

## 2017-02-03 ENCOUNTER — Emergency Department (HOSPITAL_COMMUNITY)
Admission: EM | Admit: 2017-02-03 | Discharge: 2017-02-03 | Disposition: A | Discharge status: Home / Self Care | Payer: Medicare Other | Attending: Emergency Medicine | Admitting: Emergency Medicine

## 2017-02-03 ENCOUNTER — Emergency Department (HOSPITAL_COMMUNITY): Payer: Medicare Other

## 2017-02-03 DIAGNOSIS — Z87891 Personal history of nicotine dependence: Secondary | ICD-10-CM | POA: Diagnosis not present

## 2017-02-03 DIAGNOSIS — Z7982 Long term (current) use of aspirin: Secondary | ICD-10-CM | POA: Diagnosis not present

## 2017-02-03 DIAGNOSIS — R1084 Generalized abdominal pain: Secondary | ICD-10-CM | POA: Insufficient documentation

## 2017-02-03 DIAGNOSIS — Z79899 Other long term (current) drug therapy: Secondary | ICD-10-CM | POA: Insufficient documentation

## 2017-02-03 DIAGNOSIS — J449 Chronic obstructive pulmonary disease, unspecified: Secondary | ICD-10-CM | POA: Insufficient documentation

## 2017-02-03 DIAGNOSIS — R197 Diarrhea, unspecified: Secondary | ICD-10-CM | POA: Insufficient documentation

## 2017-02-03 DIAGNOSIS — E039 Hypothyroidism, unspecified: Secondary | ICD-10-CM | POA: Insufficient documentation

## 2017-02-03 DIAGNOSIS — R112 Nausea with vomiting, unspecified: Secondary | ICD-10-CM

## 2017-02-03 LAB — COMPREHENSIVE METABOLIC PANEL
ALBUMIN: 4 g/dL (ref 3.5–5.0)
ALK PHOS: 98 U/L (ref 38–126)
ALT: 15 U/L (ref 14–54)
AST: 24 U/L (ref 15–41)
Anion gap: 11 (ref 5–15)
BUN: 15 mg/dL (ref 6–20)
CO2: 24 mmol/L (ref 22–32)
Calcium: 8.9 mg/dL (ref 8.9–10.3)
Chloride: 98 mmol/L — ABNORMAL LOW (ref 101–111)
Creatinine, Ser: 0.87 mg/dL (ref 0.44–1.00)
GFR calc non Af Amer: >60 mL/min (ref >60)
Glucose, Bld: 142 mg/dL — ABNORMAL HIGH (ref 65–99)
Potassium: 3.6 mmol/L (ref 3.5–5.1)
SODIUM: 133 mmol/L — AB (ref 135–145)
TOTAL PROTEIN: 7.6 g/dL (ref 6.5–8.1)
Total Bilirubin: 0.7 mg/dL (ref 0.3–1.2)

## 2017-02-03 LAB — URINALYSIS, ROUTINE W REFLEX MICROSCOPIC
BILIRUBIN URINE: NEGATIVE
Glucose, UA: NEGATIVE mg/dL
Ketones, ur: NEGATIVE mg/dL
LEUKOCYTES UA: NEGATIVE
NITRITE: NEGATIVE
PROTEIN: NEGATIVE mg/dL
SPECIFIC GRAVITY, URINE: 1.012 (ref 1.005–1.030)
pH: 6 (ref 5.0–8.0)

## 2017-02-03 LAB — CBC
HCT: 40.2 % (ref 36.0–46.0)
HEMOGLOBIN: 13.3 g/dL (ref 12.0–15.0)
MCH: 32 pg (ref 26.0–34.0)
MCHC: 33.1 g/dL (ref 30.0–36.0)
MCV: 96.9 fL (ref 78.0–100.0)
Platelets: 280 10*3/uL (ref 150–400)
RBC: 4.15 MIL/uL (ref 3.87–5.11)
RDW: 13.6 % (ref 11.5–15.5)
WBC: 13 10*3/uL — AB (ref 4.0–10.5)

## 2017-02-03 LAB — LIPASE, BLOOD: LIPASE: 24 U/L (ref 11–51)

## 2017-02-03 MED ORDER — GI COCKTAIL ~~LOC~~
30.0000 mL | Freq: Once | ORAL | Status: AC
  Administered 2017-02-03: 30 mL via ORAL
  Filled 2017-02-03: qty 30

## 2017-02-03 MED ORDER — ONDANSETRON 4 MG PO TBDP: 4.0000 mg | ORAL_TABLET | Freq: Three times a day (TID) | ORAL | 0 refills | Status: DC | PRN

## 2017-02-03 MED ORDER — IOPAMIDOL (ISOVUE-300) INJECTION 61%
INTRAVENOUS | Status: AC
  Filled 2017-02-03: qty 100

## 2017-02-03 MED ORDER — ONDANSETRON 4 MG PO TBDP
4.0000 mg | ORAL_TABLET | Freq: Once | ORAL | Status: AC | PRN
  Administered 2017-02-03: 4 mg via ORAL
  Filled 2017-02-03: qty 1

## 2017-02-03 MED ORDER — IOPAMIDOL (ISOVUE-300) INJECTION 61%
100.0000 mL | Freq: Once | INTRAVENOUS | Status: AC | PRN
  Administered 2017-02-03: 100 mL via INTRAVENOUS

## 2017-02-03 NOTE — ED Triage Notes (Signed)
Patient complaining of nausea and diarhea. Patient states it started around 11 am on 02/02/2017. Patient states she was visiting her mom and her mom had the stomach bug.

## 2017-02-03 NOTE — Discharge Instructions (Signed)
It was my pleasure taking care of you today!   Fortunately, your lab work and imaging was very reassuring. Zofran as needed for nausea. Increase hydration. Wash hands often.  It is VERY important that you monitor your symptoms and return to the Emergency Department if you develop any of the following symptoms:  You keep throwing up and can't keep fluids down.  You pass bloody or black tarry stools.  There is bright red blood in the stool. You do not seem to be getting better.  You have any questions or concerns.

## 2017-02-03 NOTE — ED Provider Notes (Signed)
[facility] DEPT Provider Note   CSN: [id number] Arrival date & time: 02/03/17  0235     History   Chief Complaint Chief Complaint  Patient presents with  . Nausea  . Emesis    HPI Amy [name] is a 82 y.o. female.  The history is provided by the patient and medical records. No language interpreter was used.  Emesis   Associated symptoms include abdominal pain and diarrhea.   Amy [name] is a 82 y.o. female  with a PMH of prior SBO with partial small bowel resection who presents to the Emergency Department complaining of acute onset of generalized abdominal pain associated with nausea, 3-5 episodes of emesis, and diarrhea which began at 11pm last night. No medications taken prior to ER arrival. She did have zofran PO once she got to the ER which has been helpful and subsided her nausea. No fever or chills. She does have family member who had the stomach bug and she was around them about 2 days ago.   Past Medical History:  Diagnosis Date  . Anxiety   . Arthritis   . Atrial fibrillation ([place])   . COPD (chronic obstructive pulmonary disease) ([place])   . Esophageal stricture    s/p repeated dilations, Dr. Watt Climes  . GERD (gastroesophageal reflux disease)   . Heart murmur   . Hiatal hernia   . History of blood transfusion    many years ago   . Hyperlipidemia   . Hypothyroidism   . Osteoarthritis   . Osteoporosis   . Pulmonary nodules   . Renal lesion    1cm left kidney    Patient Active Problem List   Diagnosis Date Noted  . Partial small bowel obstruction ([place]) 06/02/2016  . Small bowel obstruction, partial ([place]) 05/29/2016  . Atrial fibrillation ([place]) 03/24/2016  . Lobar pneumonia ([place]) 03/23/2016  . Aspiration pneumonia of right lung due to vomit ([place]) 03/23/2016  . Acute respiratory failure with hypoxia ([place]) 03/23/2016  . Sepsis ([place])   . CAP (community acquired pneumonia) 03/22/2016  . Pneumonia 03/22/2016  . Viral  gastroenteritis 01/25/2016  . Dehydration 01/25/2016  . Leukocytosis 01/25/2016  . Hypokalemia 01/25/2016  . Hyperglycemia 01/25/2016  . Intractable nausea and vomiting 01/25/2016  . Epigastric pain   . Normocytic anemia 12/12/2012  . GERD (gastroesophageal reflux disease)   . Hypothyroidism   . Arthritis   . Renal lesion   . Osteoarthritis   . Osteoporosis   . COPD (chronic obstructive pulmonary disease) ([place])   . Esophageal stricture   . Pulmonary nodules   . Anxiety   . Hyperlipidemia   . Biliary colic 59/93/5701  . Multiple pulmonary nodules 04/24/2012  . COPD GOLD III 04/23/2012  . Abdominal pain, unspecified site 04/23/2012  . Medial meniscus tear 02/22/2011    Past Surgical History:  Procedure Laterality Date  . ABDOMINAL HYSTERECTOMY    . BREAST ENHANCEMENT SURGERY  2006  . CATARACT EXTRACTION W/ INTRAOCULAR LENS  IMPLANT, BILATERAL  '09  . ESOPHAGEAL DILATION    . KNEE ARTHROSCOPY  02/22/2011   Procedure: ARTHROSCOPY KNEE;  Surgeon: Gearlean Alf, MD;  Location: [facility];  Service: Orthopedics;  Laterality: Right;  WITH DEBRIDEMENT   . LAPAROSCOPIC SMALL BOWEL RESECTION N/A 06/02/2016   Procedure: DIAGNOSTIC LAPAROSCOPY SMALL BOWEL RESECTION;  Surgeon: Armandina Gemma, MD;  Location: [facility];  Service: General;  Laterality: N/A;    OB History    No data available  Home Medications    Prior to Admission medications   Medication Sig Start Date End Date Taking? Authorizing Provider  ALPRAZolam (XANAX) 0.25 MG tablet Take 0.25 mg by mouth 2 (two) times daily.     [provider]  aspirin EC 81 MG tablet Take 81 mg by mouth at bedtime.    [provider]  atorvastatin (LIPITOR) 20 MG tablet Take 20 mg by mouth at bedtime.    [provider]  budesonide-formoterol (SYMBICORT) 160-4.5 MCG/ACT inhaler Inhale 2 puffs into the lungs 2 (two) times daily as needed (for shortness of breath).     Tanda Rockers, MD    Cholecalciferol (VITAMIN D3) 2000 UNITS TABS Take 2,000 Units by mouth at bedtime.     [provider]  fluticasone (FLONASE) 50 MCG/ACT nasal spray Place 1-2 sprays into both nostrils daily as needed for rhinitis.     [provider]  HYDROcodone-acetaminophen (NORCO/VICODIN) 5-325 MG tablet Take 1 tablet by mouth every 4 (four) hours as needed. Patient not taking: Reported on 05/29/2016 04/26/16   Isla Pence, MD  hydroxypropyl methylcellulose / hypromellose (ISOPTO TEARS / GONIOVISC) 2.5 % ophthalmic solution Place 1-2 drops into both eyes 3 (three) times daily as needed (to soothe dry eyes.).    [provider]  levothyroxine (SYNTHROID, LEVOTHROID) 75 MCG tablet Take 1 tablet (75 mcg total) by mouth daily before breakfast. 03/27/16   Tat, Shanon Brow, MD  metoprolol tartrate (LOPRESSOR) 25 MG tablet Take 0.5 tablets (12.5 mg total) by mouth 2 (two) times daily. 03/26/16   Orson Eva, MD  mirtazapine (REMERON) 15 MG tablet Take 15 mg by mouth at bedtime.     [provider]  ondansetron (ZOFRAN ODT) 4 MG disintegrating tablet Take 1 tablet (4 mg total) by mouth every 8 (eight) hours as needed for nausea or vomiting. 02/03/17   Ward, Ozella Almond, PA-C  pantoprazole (PROTONIX) 40 MG tablet Take 40 mg by mouth 2 (two) times daily.     [provider]  Potassium 99 MG TABS Take 99 mg by mouth at bedtime.    [provider]  promethazine (PHENERGAN) 12.5 MG tablet Take 12.5 mg by mouth every 8 (eight) hours as needed for nausea. 05/23/16   [provider]  traMADol (ULTRAM) 50 MG tablet Take 50 mg by mouth 2 (two) times daily.     [provider]    Family History Family History  Problem Relation Age of Onset  . Heart disease Father   . Rheum arthritis Father   . Heart failure Mother   . Diabetes Brother     Social History Social History   Tobacco Use  . Smoking status: Former Smoker    Packs/day: 1.00    Years: 30.00     Pack years: 30.00    Types: Cigarettes    Last attempt to quit: 01/09/1997    Years since quitting: 20.0  . Smokeless tobacco: Never Used  Substance Use Topics  . Alcohol use: No  . Drug use: No     Allergies   Codeine   Review of Systems Review of Systems  Gastrointestinal: Positive for abdominal pain, diarrhea, nausea and vomiting. Negative for blood in stool and constipation.  All other systems reviewed and are negative.    Physical Exam Updated Vital Signs BP (!) 166/82   Pulse 92   Temp 98.4 F (36.9 C) (Oral)   Resp 18   Ht 5\' 5"  (1.651 m)   Wt 57.6  kg (127 lb)   SpO2 94%   BMI 21.13 kg/m   Physical Exam  Constitutional: She is oriented to person, place, and time. She appears well-developed and well-nourished. No distress.  HENT:  Head: Normocephalic and atraumatic.  Cardiovascular: Normal rate, regular rhythm and normal heart sounds.  No murmur heard. Pulmonary/Chest: Effort normal and breath sounds normal. No respiratory distress.  Abdominal: Soft. She exhibits no distension.  Mild diffuse abdominal tenderness with no focality. No rebound or guarding.  Musculoskeletal: Normal range of motion.  Neurological: She is alert and oriented to person, place, and time.  Skin: Skin is warm and dry.  Nursing note and vitals reviewed.    ED Treatments / Results  Labs (all labs ordered are listed, but only abnormal results are displayed) Labs Reviewed  COMPREHENSIVE METABOLIC PANEL - Abnormal; Notable for the following components:      Result Value   Sodium 133 (*)    Chloride 98 (*)    Glucose, Bld 142 (*)    All other components within normal limits  CBC - Abnormal; Notable for the following components:   WBC 13.0 (*)    All other components within normal limits  URINALYSIS, ROUTINE W REFLEX MICROSCOPIC - Abnormal; Notable for the following components:   Hgb urine dipstick SMALL (*)    Bacteria, UA RARE (*)    Squamous Epithelial / LPF 0-5 (*)    All  other components within normal limits  LIPASE, BLOOD    EKG  EKG Interpretation None       Radiology Ct Abdomen Pelvis W Contrast  Result Date: 02/03/2017 CLINICAL DATA:  Nausea and diarrhea EXAM: CT ABDOMEN AND PELVIS WITH CONTRAST TECHNIQUE: Multidetector CT imaging of the abdomen and pelvis was performed using the standard protocol following bolus administration of intravenous contrast. CONTRAST:  135mL ISOVUE-300 IOPAMIDOL (ISOVUE-300) INJECTION 61% COMPARISON:  04/25/2016 FINDINGS: Lower chest: No acute abnormality. Bilateral breast implants are noted. Hepatobiliary: No focal liver abnormality is seen. Status post cholecystectomy. No biliary dilatation. Pancreas: Unremarkable. No pancreatic ductal dilatation or surrounding inflammatory changes. Spleen: Normal in size without focal abnormality. Adrenals/Urinary Tract: Adrenal glands are within normal limits bilaterally. A small cyst is noted in the lower pole of the right kidney stable from the prior exam. No bladder abnormality is seen. Stomach/Bowel: Mild sliding-type hiatal hernia is noted. Diffuse esophageal thickening is noted distally similar to that seen on the prior exam. No obstructive or inflammatory changes of the bowel are seen. Mild diverticular change in the sigmoid colon is noted. Vascular/Lymphatic: Aortic atherosclerosis. No enlarged abdominal or pelvic lymph nodes. Reproductive: Status post hysterectomy. No adnexal masses. Other: No abdominal wall hernia or abnormality. No abdominopelvic ascites. Musculoskeletal: Degenerative changes of the lumbar spine are again noted. IMPRESSION: Diffuse distal esophageal thickening as well as a sliding-type hiatal hernia. The esophageal changes are likely related to reflux esophagitis and are stable from the prior exam. No acute abnormality noted. Electronically Signed   By: Inez Catalina M.D.   On: 02/03/2017 07:20    Procedures Procedures (including critical care time)  Medications  Ordered in ED Medications  iopamidol (ISOVUE-300) 61 % injection (not administered)  ondansetron (ZOFRAN-ODT) disintegrating tablet 4 mg (4 mg Oral Given 02/03/17 0252)  iopamidol (ISOVUE-300) 61 % injection 100 mL (100 mLs Intravenous Contrast Given 02/03/17 0708)     Initial Impression / Assessment and Plan / ED Course  I have reviewed the triage vital signs and the nursing notes.  Pertinent labs & imaging  results that were available during my care of the patient were reviewed by me and considered in my medical decision making (see chart for details).    SANJUANA MRUK is a 82 y.o. female who presents to ED for generalized domino pain, nausea, vomiting, diarrhea.  Does have sick contact with family with stomach bug recently.  Afebrile and hemodynamically stable.  Does have diffuse generalized mild tenderness, but no peritoneal signs.  She has history of prior SBO with partial resection. Sxs likely secondary to viral gastroenteritis, however even history will obtain CT scan for further evaluation.  Labs reviewed and reassuring. Does have mild leukocytosis. UA with no signs of infection.  CT scan negative for acute abnormalities.  Will discharge home with symptomatic care with PCP follow-up if symptoms persist.  Reasons to return to ER discussed and all questions answered.  Patient seen by and discussed with Dr. Randal Buba who agrees with treatment plan.    Final Clinical Impressions(s) / ED Diagnoses   Final diagnoses:  Nausea vomiting and diarrhea    ED Discharge Orders        Ordered    ondansetron (ZOFRAN ODT) 4 MG disintegrating tablet  Every 8 hours PRN     02/03/17 0807       Ward, Ozella Almond, PA-C 02/03/17 7829    Palumbo, April, MD 02/03/17 2304

## 2017-02-03 NOTE — ED Notes (Signed)
Pt attempted to give a urine sample but was unsuccessful. Pt will try again

## 2017-04-23 ENCOUNTER — Other Ambulatory Visit: Payer: Self-pay | Admitting: Family Medicine

## 2017-04-23 DIAGNOSIS — N6489 Other specified disorders of breast: Secondary | ICD-10-CM

## 2017-05-18 ENCOUNTER — Ambulatory Visit: Payer: Self-pay

## 2017-05-18 ENCOUNTER — Ambulatory Visit
Admission: RE | Admit: 2017-05-18 | Discharge: 2017-05-18 | Disposition: A | Discharge status: Home / Self Care | Payer: Medicare Other | Source: Ambulatory Visit | Attending: Family Medicine | Admitting: Family Medicine

## 2017-05-18 DIAGNOSIS — N6489 Other specified disorders of breast: Secondary | ICD-10-CM

## 2018-03-20 ENCOUNTER — Encounter: Payer: Self-pay | Admitting: Neurology

## 2018-03-20 ENCOUNTER — Other Ambulatory Visit: Payer: Self-pay

## 2018-03-20 ENCOUNTER — Ambulatory Visit: Payer: Medicare Other | Admitting: Neurology

## 2018-03-20 VITALS — BP 166/76 | HR 73 | Ht 65.0 in | Wt 145.0 lb

## 2018-03-20 DIAGNOSIS — R202 Paresthesia of skin: Secondary | ICD-10-CM

## 2018-03-20 DIAGNOSIS — R269 Unspecified abnormalities of gait and mobility: Secondary | ICD-10-CM | POA: Insufficient documentation

## 2018-03-20 DIAGNOSIS — G629 Polyneuropathy, unspecified: Secondary | ICD-10-CM | POA: Diagnosis not present

## 2018-03-20 DIAGNOSIS — R200 Anesthesia of skin: Secondary | ICD-10-CM | POA: Diagnosis not present

## 2018-03-20 MED ORDER — GABAPENTIN 100 MG PO CAPS: 100.0000 mg | ORAL_CAPSULE | Freq: Three times a day (TID) | ORAL | 5 refills | Status: DC

## 2018-03-20 NOTE — Progress Notes (Signed)
[facility]  PATIENT: Amy [name] DOB: Aug 28, 1935  REFERRING DOCTOR OR PCP:  Bing Matter SOURCE: patient, notes from PCP  _________________________________   HISTORICAL  CHIEF COMPLAINT:  Chief Complaint  Patient presents with   New Patient (Initial Visit)    RM 12, alone. Paper referral from Bing Matter, [place] for tingling in lower extremities. This is chronic. Also has burning pain with this. Usually lasts for 1-2 days and notices it more at night when she goes to sit down and relax. No trouble sleeping. No personal hx diabetes. Denies any numbness in her feet.     HISTORY OF PRESENT ILLNESS:  I had the pleasure of seeing patient, Amy [name], at [facility] for neurologic consultation regarding the numbness and tingling in her legs.    She is an 83 year old woman who began to experience numbness and tingling with a burning sensation in her legs below her knees about a month or two ago.     Symptoms built up over a couple weeks.     She notes the symptoms more at night.    Moving around will make her more comfortable.    She notes mild weakness in her legs that has been gradual and present for at least a few years.   She notes her balance has gradually worsened over the past few years.     She has stress incontinence x many years and notes no recent change with her bladder function.    Tramadol helps her pain some.    She has hypothyroidism, COPD, mild depression (since husband died with cancer 03/09/10).  Since then she has had insomnia.   Remeron helps the insomnia.      REVIEW OF SYSTEMS: Constitutional: No fevers, chills, sweats, or change in appetite Eyes: No visual changes, double vision, eye pain Ear, nose and throat: No hearing loss, ear pain, nasal congestion, sore throat Cardiovascular: No chest pain, palpitations Respiratory: No shortness of breath at rest or with exertion.   No wheezes GastrointestinaI: No nausea, vomiting,  diarrhea, abdominal pain, fecal incontinence Genitourinary: No dysuria, urinary retention or frequency.  No nocturia. Musculoskeletal: No neck pain, back pain Integumentary: No rash, pruritus, skin lesions Neurological: as above Psychiatric: No depression at this time.  No anxiety Endocrine: No palpitations, diaphoresis, change in appetite, change in weigh or increased thirst Hematologic/Lymphatic: No anemia, purpura, petechiae. Allergic/Immunologic: No itchy/runny eyes, nasal congestion, recent allergic reactions, rashes  ALLERGIES: Allergies  Allergen Reactions   Codeine Other (See Comments)    Reaction:  Hallucinations    HOME MEDICATIONS:  Current Outpatient Medications:    ALPRAZolam (XANAX) 0.25 MG tablet, Take 0.25 mg by mouth 2 (two) times daily. , Disp: , Rfl:    aspirin EC 81 MG tablet, Take 81 mg by mouth at bedtime., Disp: , Rfl:    atorvastatin (LIPITOR) 20 MG tablet, Take 20 mg by mouth at bedtime., Disp: , Rfl: 11   budesonide-formoterol (SYMBICORT) 160-4.5 MCG/ACT inhaler, Inhale 2 puffs into the lungs 2 (two) times daily as needed (for shortness of breath). , Disp: , Rfl:    Cholecalciferol (VITAMIN D3) 2000 UNITS TABS, Take 2,000 Units by mouth at bedtime. , Disp: , Rfl:    fluticasone (FLONASE) 50 MCG/ACT nasal spray, Place 1-2 sprays into both nostrils daily as needed for rhinitis. , Disp: , Rfl: 11   hydroxypropyl methylcellulose / hypromellose (ISOPTO TEARS / GONIOVISC) 2.5 % ophthalmic solution, Place 1-2 drops into both eyes 3 (three)  times daily as needed (to soothe dry eyes.)., Disp: , Rfl:    levothyroxine (SYNTHROID, LEVOTHROID) 75 MCG tablet, Take 1 tablet (75 mcg total) by mouth daily before breakfast., Disp: 30 tablet, Rfl: 1   metoprolol tartrate (LOPRESSOR) 25 MG tablet, Take 0.5 tablets (12.5 mg total) by mouth 2 (two) times daily., Disp: 60 tablet, Rfl: 1   mirtazapine (REMERON) 15 MG tablet, Take 15 mg by mouth at bedtime. , Disp: , Rfl:      ondansetron (ZOFRAN ODT) 4 MG disintegrating tablet, Take 1 tablet (4 mg total) by mouth every 8 (eight) hours as needed for nausea or vomiting., Disp: 20 tablet, Rfl: 0   pantoprazole (PROTONIX) 40 MG tablet, Take 40 mg by mouth 2 (two) times daily. , Disp: , Rfl: 11   Potassium 99 MG TABS, Take 99 mg by mouth at bedtime., Disp: , Rfl:    traMADol (ULTRAM) 50 MG tablet, Take 50 mg by mouth 2 (two) times daily., Disp: , Rfl:    gabapentin (NEURONTIN) 100 MG capsule, Take 1 capsule (100 mg total) by mouth 3 (three) times daily. Take one po qAM, one po in the afternoon and two po qHS., Disp: 120 capsule, Rfl: 5  PAST MEDICAL HISTORY: Past Medical History:  Diagnosis Date   Anxiety    Arthritis    Atrial fibrillation (H[facility])    COPD (chronic obstructive pulmonary disease) (H[facility])    Esophageal stricture    s/p repeated dilations, Dr. Watt Climes   GERD (gastroesophageal reflux disease)    Hearing loss bilateral   has hearing aids   Heart murmur    Hiatal hernia    History of blood transfusion    many years ago    Hyperlipidemia    Hypothyroidism    Osteoarthritis    Osteoporosis    Pulmonary nodules    Renal lesion    1cm left kidney    PAST SURGICAL HISTORY: Past Surgical History:  Procedure Laterality Date   ABDOMINAL HYSTERECTOMY     BREAST ENHANCEMENT SURGERY  2006   CATARACT EXTRACTION W/ INTRAOCULAR LENS  IMPLANT, BILATERAL  '09   ESOPHAGEAL DILATION     KNEE ARTHROSCOPY  02/22/2011   Procedure: ARTHROSCOPY KNEE;  Surgeon: Gearlean Alf, MD;  Location: [facility];  Service: Orthopedics;  Laterality: Right;  WITH DEBRIDEMENT    LAPAROSCOPIC SMALL BOWEL RESECTION N/A 06/02/2016   Procedure: DIAGNOSTIC LAPAROSCOPY SMALL BOWEL RESECTION;  Surgeon: Armandina Gemma, MD;  Location: [facility];  Service: General;  Laterality: N/A;    FAMILY HISTORY: Family History  Problem Relation Age of Onset   Heart disease Father    Rheum arthritis  Father    Heart failure Mother    Diabetes Brother     SOCIAL HISTORY:  Social History   Socioeconomic History   Marital status: Widowed    Spouse name: Not on file   Number of children: 3   Years of education: 12   Highest education level: Not on file  Occupational History   Not on file  Social Needs   Financial resource strain: Not on file   Food insecurity:    Worry: Not on file    Inability: Not on file   Transportation needs:    Medical: Not on file    Non-medical: Not on file  Tobacco Use   Smoking status: Former Smoker    Packs/day: 1.00    Years: 30.00    Pack years: 30.00    Types: Cigarettes  Last attempt to quit: 01/09/1997    Years since quitting: 21.2   Smokeless tobacco: Never Used  Substance and Sexual Activity   Alcohol use: No   Drug use: No   Sexual activity: Not on file  Lifestyle   Physical activity:    Days per week: Not on file    Minutes per session: Not on file   Stress: Not on file  Relationships   Social connections:    Talks on phone: Not on file    Gets together: Not on file    Attends religious service: Not on file    Active member of club or organization: Not on file    Attends meetings of clubs or organizations: Not on file    Relationship status: Not on file   Intimate partner violence:    Fear of current or ex partner: Not on file    Emotionally abused: Not on file    Physically abused: Not on file    Forced sexual activity: Not on file  Other Topics Concern   Not on file  Social History Narrative   Right handed    Live alone   Caffeine use: 1 cup coffee every morning     PHYSICAL EXAM  Vitals:   03/20/18 1542  BP: (!) 166/76  Pulse: 73  Weight: 145 lb (65.8 kg)  Height: 5' 5" (1.651 m)    Body mass index is 24.13 kg/m.   General: The patient is well-developed and well-nourished and in no acute distress  Eyes:  Funduscopic exam shows normal optic discs and retinal vessels.  Neck:  The neck is supple, no carotid bruits are noted.  The neck is nontender.  Cardiovascular: The heart has a regular rate and rhythm with a normal S1 and S2. There were no murmurs, gallops or rubs.    Skin: Extremities are without rash or edema.   Musculoskeletal:  Back is nontender  Neurologic Exam  Mental status: The patient is alert and oriented x 3 at the time of the examination. The patient has apparent normal recent and remote memory, with an apparently normal attention span and concentration ability.   Speech is normal.  Cranial nerves: Extraocular movements are full. Facial symmetry is present. There is good facial sensation to soft touch bilaterally.Facial strength is normal.  Trapezius and sternocleidomastoid strength is normal. No dysarthria is noted.  The tongue is midline, and the patient has symmetric elevation of the soft palate. No obvious hearing deficits are noted.  Motor:  Muscle bulk is normal.   Tone is normal. Strength is  5 / 5 in all 4 extremities.   Sensory: Sensory testing is intact to pinprick, soft touch and vibration sensation in the arms.  Normal touch sensation in feet but reduced vibration sensation (25-50%)  Coordination: Cerebellar testing shows good finger-nose-finger and heel-to-shin bilaterally.  Gait and station: Station is normal.   Gait is fairly normal for age. Tandem gait is moderately wide. Romberg is negative.   Reflexes: Deep tendon reflexes are symmetric and normal in arms 2+ at knees and 1 at ankles.  No clonus.   Plantar responses are flexor.    DIAGNOSTIC DATA (LABS, IMAGING, TESTING) - I reviewed patient records, labs, notes, testing and imaging myself where available.  Lab Results  Component Value Date   WBC 13.0 (H) 02/03/2017   HGB 13.3 02/03/2017   HCT 40.2 02/03/2017   MCV 96.9 02/03/2017   PLT 280 02/03/2017      Component Value  Date/Time   NA 133 (L) 02/03/2017 0416   K 3.6 02/03/2017 0416   CL 98 (L) 02/03/2017 0416    CO2 24 02/03/2017 0416   GLUCOSE 142 (H) 02/03/2017 0416   BUN 15 02/03/2017 0416   CREATININE 0.87 02/03/2017 0416   CALCIUM 8.9 02/03/2017 0416   PROT 7.6 02/03/2017 0416   ALBUMIN 4.0 02/03/2017 0416   AST 24 02/03/2017 0416   ALT 15 02/03/2017 0416   ALKPHOS 98 02/03/2017 0416   BILITOT 0.7 02/03/2017 0416   GFRNONAA >60 02/03/2017 0416   GFRAA >60 02/03/2017 0416   No results found for: CHOL, HDL, LDLCALC, LDLDIRECT, TRIG, CHOLHDL Lab Results  Component Value Date   HGBA1C 5.1 01/25/2016   Lab Results  Component Value Date   ZOXWRUEA54 098 12/13/2012   Lab Results  Component Value Date   TSH 0.048 (L) 03/23/2016       ASSESSMENT AND PLAN  Numbness and tingling of both feet - Plan: NCV with EMG(electromyography), Multiple Myeloma Panel (SPEP&IFE w/QIG), Vitamin B12  Polyneuropathy - Plan: NCV with EMG(electromyography), Multiple Myeloma Panel (SPEP&IFE w/QIG), Vitamin B12  Gait disturbance   In summary, Ms. Mcauliff is an 83 year old woman with numbness in her feet who also notes mild worsening of her gait over the past year.  On examination, she has reduced vibration sensation in the toes but normal vibration sensation in the ankles.  She might have a mild polyneuropathy and we will check B12, SPEP and NCV/EMG to further evaluate.  Because the sensations are uncomfortable, I will have her start gabapentin and increase the dose based on the response and tolerability.  I will see her when she returns for the EMG.  She should call sooner if she has new or worsening neurologic symptoms.  Thank you for asking me to see Ms. Abair.  Please let me know if I can be of further assistance with her or other patients in the future.   Domanick Cuccia A. Felecia Shelling, MD, [facility] 01/28/1476, 2:95 PM Certified in Neurology, Clinical Neurophysiology, Sleep Medicine, Pain Medicine and Neuroimaging  [facility] [address], [place], [place] [address] [phone]

## 2018-03-22 LAB — MULTIPLE MYELOMA PANEL, SERUM
ALBUMIN SERPL ELPH-MCNC: 3.8 g/dL (ref 2.9–4.4)
ALBUMIN/GLOB SERPL: 1.1 (ref 0.7–1.7)
ALPHA 1: 0.2 g/dL (ref 0.0–0.4)
Alpha2 Glob SerPl Elph-Mcnc: 0.9 g/dL (ref 0.4–1.0)
B-Globulin SerPl Elph-Mcnc: 1.1 g/dL (ref 0.7–1.3)
GAMMA GLOB SERPL ELPH-MCNC: 1.3 g/dL (ref 0.4–1.8)
Globulin, Total: 3.5 g/dL (ref 2.2–3.9)
IGA/IMMUNOGLOBULIN A, SERUM: 409 mg/dL (ref 64–422)
IGG (IMMUNOGLOBIN G), SERUM: 1321 mg/dL (ref 700–1600)
IgM (Immunoglobulin M), Srm: 88 mg/dL (ref 26–217)
Total Protein: 7.3 g/dL (ref 6.0–8.5)

## 2018-03-22 LAB — VITAMIN B12: VITAMIN B 12: 1614 pg/mL — AB (ref 232–1245)

## 2018-03-25 ENCOUNTER — Telehealth: Payer: Self-pay | Admitting: *Deleted

## 2018-03-25 NOTE — Telephone Encounter (Signed)
Called and spoke with pt about lab results per Dr. Felecia Shelling. Pt verbalized understanding

## 2018-03-25 NOTE — Telephone Encounter (Signed)
-----   Message from Britt Bottom, MD sent at 03/23/2018  7:30 PM EDT ----- Please let the patient know that the lab work is fine.

## 2018-04-12 ENCOUNTER — Other Ambulatory Visit: Payer: Self-pay | Admitting: Neurology

## 2018-04-23 ENCOUNTER — Encounter: Payer: Medicare Other | Admitting: Neurology

## 2018-05-27 ENCOUNTER — Telehealth: Payer: Self-pay

## 2018-05-27 NOTE — Telephone Encounter (Signed)
I called and spoke to patient about rescheduling her NCV/EMG that was canceled back in April. Patient just lost her grandson over the weekend and is not ready to schedule at this time. I offered my condolences and advised her that it was ok and that I would reach back out to her in a couple of weeks. She voiced appreciation.

## 2018-07-17 ENCOUNTER — Other Ambulatory Visit: Payer: Self-pay | Admitting: Family Medicine

## 2018-07-17 DIAGNOSIS — Z1231 Encounter for screening mammogram for malignant neoplasm of breast: Secondary | ICD-10-CM

## 2018-07-28 ENCOUNTER — Encounter (HOSPITAL_COMMUNITY): Payer: Self-pay

## 2018-07-28 ENCOUNTER — Emergency Department (HOSPITAL_COMMUNITY): Payer: Medicare Other

## 2018-07-28 ENCOUNTER — Observation Stay (HOSPITAL_COMMUNITY)
Admission: EM | Admit: 2018-07-28 | Discharge: 2018-07-29 | Disposition: A | Discharge status: Home Health | Payer: Medicare Other | Attending: Internal Medicine | Admitting: Internal Medicine

## 2018-07-28 ENCOUNTER — Other Ambulatory Visit: Payer: Self-pay

## 2018-07-28 DIAGNOSIS — N3000 Acute cystitis without hematuria: Secondary | ICD-10-CM | POA: Diagnosis not present

## 2018-07-28 DIAGNOSIS — M810 Age-related osteoporosis without current pathological fracture: Secondary | ICD-10-CM | POA: Insufficient documentation

## 2018-07-28 DIAGNOSIS — J449 Chronic obstructive pulmonary disease, unspecified: Secondary | ICD-10-CM | POA: Insufficient documentation

## 2018-07-28 DIAGNOSIS — Z87891 Personal history of nicotine dependence: Secondary | ICD-10-CM | POA: Insufficient documentation

## 2018-07-28 DIAGNOSIS — N3001 Acute cystitis with hematuria: Secondary | ICD-10-CM

## 2018-07-28 DIAGNOSIS — R2681 Unsteadiness on feet: Secondary | ICD-10-CM | POA: Insufficient documentation

## 2018-07-28 DIAGNOSIS — E785 Hyperlipidemia, unspecified: Secondary | ICD-10-CM | POA: Insufficient documentation

## 2018-07-28 DIAGNOSIS — E039 Hypothyroidism, unspecified: Secondary | ICD-10-CM | POA: Diagnosis not present

## 2018-07-28 DIAGNOSIS — M069 Rheumatoid arthritis, unspecified: Secondary | ICD-10-CM | POA: Diagnosis not present

## 2018-07-28 DIAGNOSIS — M1990 Unspecified osteoarthritis, unspecified site: Secondary | ICD-10-CM | POA: Diagnosis present

## 2018-07-28 DIAGNOSIS — D649 Anemia, unspecified: Secondary | ICD-10-CM | POA: Diagnosis not present

## 2018-07-28 DIAGNOSIS — Z8679 Personal history of other diseases of the circulatory system: Secondary | ICD-10-CM | POA: Diagnosis present

## 2018-07-28 DIAGNOSIS — I10 Essential (primary) hypertension: Secondary | ICD-10-CM | POA: Insufficient documentation

## 2018-07-28 DIAGNOSIS — F329 Major depressive disorder, single episode, unspecified: Secondary | ICD-10-CM | POA: Diagnosis not present

## 2018-07-28 DIAGNOSIS — Z20828 Contact with and (suspected) exposure to other viral communicable diseases: Secondary | ICD-10-CM | POA: Insufficient documentation

## 2018-07-28 DIAGNOSIS — F419 Anxiety disorder, unspecified: Secondary | ICD-10-CM | POA: Diagnosis not present

## 2018-07-28 DIAGNOSIS — Z79890 Hormone replacement therapy (postmenopausal): Secondary | ICD-10-CM | POA: Insufficient documentation

## 2018-07-28 DIAGNOSIS — Z79899 Other long term (current) drug therapy: Secondary | ICD-10-CM | POA: Diagnosis not present

## 2018-07-28 DIAGNOSIS — I480 Paroxysmal atrial fibrillation: Secondary | ICD-10-CM | POA: Diagnosis not present

## 2018-07-28 DIAGNOSIS — E860 Dehydration: Secondary | ICD-10-CM | POA: Insufficient documentation

## 2018-07-28 DIAGNOSIS — Z7982 Long term (current) use of aspirin: Secondary | ICD-10-CM | POA: Diagnosis not present

## 2018-07-28 DIAGNOSIS — N390 Urinary tract infection, site not specified: Secondary | ICD-10-CM | POA: Diagnosis not present

## 2018-07-28 DIAGNOSIS — G629 Polyneuropathy, unspecified: Secondary | ICD-10-CM | POA: Diagnosis not present

## 2018-07-28 DIAGNOSIS — R531 Weakness: Secondary | ICD-10-CM

## 2018-07-28 DIAGNOSIS — I4891 Unspecified atrial fibrillation: Secondary | ICD-10-CM | POA: Diagnosis present

## 2018-07-28 DIAGNOSIS — K219 Gastro-esophageal reflux disease without esophagitis: Secondary | ICD-10-CM | POA: Diagnosis not present

## 2018-07-28 DIAGNOSIS — R627 Adult failure to thrive: Secondary | ICD-10-CM | POA: Diagnosis not present

## 2018-07-28 LAB — COMPREHENSIVE METABOLIC PANEL
ALT: 19 U/L (ref 0–44)
AST: 34 U/L (ref 15–41)
Albumin: 3.5 g/dL (ref 3.5–5.0)
Alkaline Phosphatase: 99 U/L (ref 38–126)
Anion gap: 10 (ref 5–15)
BUN: 9 mg/dL (ref 8–23)
CO2: 29 mmol/L (ref 22–32)
Calcium: 8.3 mg/dL — ABNORMAL LOW (ref 8.9–10.3)
Chloride: 92 mmol/L — ABNORMAL LOW (ref 98–111)
Creatinine, Ser: 0.84 mg/dL (ref 0.44–1.00)
GFR calc Af Amer: >60 mL/min (ref >60)
GFR calc non Af Amer: >60 mL/min (ref >60)
Glucose, Bld: 104 mg/dL — ABNORMAL HIGH (ref 70–99)
Potassium: 4.2 mmol/L (ref 3.5–5.1)
Sodium: 131 mmol/L — ABNORMAL LOW (ref 135–145)
Total Bilirubin: 0.7 mg/dL (ref 0.3–1.2)
Total Protein: 7.2 g/dL (ref 6.5–8.1)

## 2018-07-28 LAB — URINALYSIS, ROUTINE W REFLEX MICROSCOPIC
Bilirubin Urine: NEGATIVE
Glucose, UA: NEGATIVE mg/dL
Ketones, ur: NEGATIVE mg/dL
Nitrite: NEGATIVE
Protein, ur: NEGATIVE mg/dL
Specific Gravity, Urine: 1.005 (ref 1.005–1.030)
pH: 6 (ref 5.0–8.0)

## 2018-07-28 LAB — CBC WITH DIFFERENTIAL/PLATELET
Abs Immature Granulocytes: 0.05 10*3/uL (ref 0.00–0.07)
Basophils Absolute: 0.1 10*3/uL (ref 0.0–0.1)
Basophils Relative: 1 %
Eosinophils Absolute: 0 10*3/uL (ref 0.0–0.5)
Eosinophils Relative: 0 %
HCT: 37.8 % (ref 36.0–46.0)
Hemoglobin: 11.8 g/dL — ABNORMAL LOW (ref 12.0–15.0)
Immature Granulocytes: 1 %
Lymphocytes Relative: 42 %
Lymphs Abs: 3.9 10*3/uL (ref 0.7–4.0)
MCH: 31.2 pg (ref 26.0–34.0)
MCHC: 31.2 g/dL (ref 30.0–36.0)
MCV: 100 fL (ref 80.0–100.0)
Monocytes Absolute: 1.3 10*3/uL — ABNORMAL HIGH (ref 0.1–1.0)
Monocytes Relative: 14 %
Neutro Abs: 3.9 10*3/uL (ref 1.7–7.7)
Neutrophils Relative %: 42 %
Platelets: 352 10*3/uL (ref 150–400)
RBC: 3.78 MIL/uL — ABNORMAL LOW (ref 3.87–5.11)
RDW: 13.2 % (ref 11.5–15.5)
WBC: 9.2 10*3/uL (ref 4.0–10.5)
nRBC: 0 % (ref 0.0–0.2)

## 2018-07-28 LAB — TROPONIN I (HIGH SENSITIVITY)
Troponin I (High Sensitivity): 7 ng/L (ref <18)
Troponin I (High Sensitivity): 6 ng/L (ref <18)

## 2018-07-28 LAB — BRAIN NATRIURETIC PEPTIDE: B Natriuretic Peptide: 70.9 pg/mL (ref 0.0–100.0)

## 2018-07-28 LAB — HEMOGLOBIN A1C
Hgb A1c MFr Bld: 5.9 % — ABNORMAL HIGH (ref 4.8–5.6)
Mean Plasma Glucose: 122.63 mg/dL

## 2018-07-28 LAB — TSH: TSH: 1.058 u[IU]/mL (ref 0.350–4.500)

## 2018-07-28 LAB — SARS CORONAVIRUS 2 BY RT PCR (HOSPITAL ORDER, PERFORMED IN ~~LOC~~ HOSPITAL LAB): SARS Coronavirus 2: NEGATIVE

## 2018-07-28 LAB — MAGNESIUM: Magnesium: 2 mg/dL (ref 1.7–2.4)

## 2018-07-28 LAB — PHOSPHORUS: Phosphorus: 2.3 mg/dL — ABNORMAL LOW (ref 2.5–4.6)

## 2018-07-28 MED ORDER — ONDANSETRON HCL 4 MG PO TABS: 4.0000 mg | ORAL_TABLET | Freq: Four times a day (QID) | ORAL | Status: DC | PRN

## 2018-07-28 MED ORDER — LEVOTHYROXINE SODIUM 75 MCG PO TABS
75.0000 ug | ORAL_TABLET | Freq: Every day | ORAL | Status: DC
  Administered 2018-07-29: 75 ug via ORAL
  Filled 2018-07-28: qty 1

## 2018-07-28 MED ORDER — SODIUM CHLORIDE 0.9% FLUSH
3.0000 mL | Freq: Two times a day (BID) | INTRAVENOUS | Status: DC
  Administered 2018-07-28 – 2018-07-29 (×2): 3 mL via INTRAVENOUS

## 2018-07-28 MED ORDER — ACETAMINOPHEN 325 MG PO TABS: 650.0000 mg | ORAL_TABLET | Freq: Four times a day (QID) | ORAL | Status: DC | PRN

## 2018-07-28 MED ORDER — POLYVINYL ALCOHOL 1.4 % OP SOLN
1.0000 [drp] | Freq: Three times a day (TID) | OPHTHALMIC | Status: DC | PRN
  Administered 2018-07-29: 2 [drp] via OPHTHALMIC
  Filled 2018-07-28: qty 15

## 2018-07-28 MED ORDER — SODIUM CHLORIDE 0.9 % IV SOLN
1.0000 g | Freq: Once | INTRAVENOUS | Status: AC
  Administered 2018-07-28: 1 g via INTRAVENOUS
  Filled 2018-07-28: qty 10

## 2018-07-28 MED ORDER — DOCUSATE SODIUM 100 MG PO CAPS
100.0000 mg | ORAL_CAPSULE | Freq: Two times a day (BID) | ORAL | Status: DC
  Administered 2018-07-28: 100 mg via ORAL
  Filled 2018-07-28: qty 1

## 2018-07-28 MED ORDER — ACETAMINOPHEN 650 MG RE SUPP: 650.0000 mg | Freq: Four times a day (QID) | RECTAL | Status: DC | PRN

## 2018-07-28 MED ORDER — ONDANSETRON HCL 4 MG/2ML IJ SOLN: 4.0000 mg | Freq: Four times a day (QID) | INTRAMUSCULAR | Status: DC | PRN

## 2018-07-28 MED ORDER — PANTOPRAZOLE SODIUM 40 MG PO TBEC
40.0000 mg | DELAYED_RELEASE_TABLET | Freq: Two times a day (BID) | ORAL | Status: DC
  Administered 2018-07-28 – 2018-07-29 (×2): 40 mg via ORAL
  Filled 2018-07-28 (×2): qty 1

## 2018-07-28 MED ORDER — SORBITOL 70 % SOLN
30.0000 mL | Freq: Every day | Status: DC | PRN
  Filled 2018-07-28: qty 30

## 2018-07-28 MED ORDER — KETOROLAC TROMETHAMINE 15 MG/ML IJ SOLN
15.0000 mg | Freq: Four times a day (QID) | INTRAMUSCULAR | Status: DC | PRN
  Administered 2018-07-29: 15 mg via INTRAVENOUS
  Filled 2018-07-28: qty 1

## 2018-07-28 MED ORDER — TRAZODONE HCL 50 MG PO TABS: 50.0000 mg | ORAL_TABLET | Freq: Every evening | ORAL | Status: DC | PRN

## 2018-07-28 MED ORDER — GABAPENTIN 100 MG PO CAPS
100.0000 mg | ORAL_CAPSULE | Freq: Three times a day (TID) | ORAL | Status: DC
  Administered 2018-07-28 – 2018-07-29 (×3): 100 mg via ORAL
  Filled 2018-07-28 (×3): qty 1

## 2018-07-28 MED ORDER — FLUTICASONE PROPIONATE 50 MCG/ACT NA SUSP: 1.0000 | Freq: Every day | NASAL | Status: DC | PRN

## 2018-07-28 MED ORDER — FLUTICASONE FUROATE-VILANTEROL 200-25 MCG/INH IN AEPB
1.0000 | INHALATION_SPRAY | Freq: Every day | RESPIRATORY_TRACT | Status: DC
  Filled 2018-07-28: qty 28

## 2018-07-28 MED ORDER — VITAMIN D3 25 MCG (1000 UNIT) PO TABS
2000.0000 [IU] | ORAL_TABLET | Freq: Every day | ORAL | Status: DC
  Administered 2018-07-28: 20:00:00 2000 [IU] via ORAL
  Filled 2018-07-28: qty 2

## 2018-07-28 MED ORDER — SODIUM CHLORIDE 0.9 % IV SOLN
INTRAVENOUS | Status: AC
  Administered 2018-07-28 – 2018-07-29 (×2): via INTRAVENOUS

## 2018-07-28 MED ORDER — SENNOSIDES-DOCUSATE SODIUM 8.6-50 MG PO TABS: 1.0000 | ORAL_TABLET | Freq: Every evening | ORAL | Status: DC | PRN

## 2018-07-28 MED ORDER — HEPARIN SODIUM (PORCINE) 5000 UNIT/ML IJ SOLN: 5000.0000 [IU] | Freq: Three times a day (TID) | INTRAMUSCULAR | Status: DC

## 2018-07-28 MED ORDER — LOPERAMIDE HCL 2 MG PO CAPS
2.0000 mg | ORAL_CAPSULE | Freq: Two times a day (BID) | ORAL | Status: DC | PRN
  Administered 2018-07-28: 2 mg via ORAL
  Filled 2018-07-28: qty 1

## 2018-07-28 MED ORDER — MAGNESIUM CITRATE PO SOLN: 1.0000 | Freq: Once | ORAL | Status: DC | PRN

## 2018-07-28 MED ORDER — SODIUM CHLORIDE 0.9 % IV SOLN
1.0000 g | Freq: Two times a day (BID) | INTRAVENOUS | Status: DC
  Administered 2018-07-29: 1 g via INTRAVENOUS
  Filled 2018-07-28: qty 1
  Filled 2018-07-28: qty 10

## 2018-07-28 MED ORDER — LEVALBUTEROL HCL 0.63 MG/3ML IN NEBU: 0.6300 mg | INHALATION_SOLUTION | Freq: Four times a day (QID) | RESPIRATORY_TRACT | Status: DC | PRN

## 2018-07-28 MED ORDER — ENOXAPARIN SODIUM 40 MG/0.4ML ~~LOC~~ SOLN
40.0000 mg | SUBCUTANEOUS | Status: DC
  Administered 2018-07-28: 40 mg via SUBCUTANEOUS
  Filled 2018-07-28: qty 0.4

## 2018-07-28 MED ORDER — METOPROLOL TARTRATE 25 MG PO TABS
12.5000 mg | ORAL_TABLET | Freq: Two times a day (BID) | ORAL | Status: DC
  Administered 2018-07-28 – 2018-07-29 (×2): 12.5 mg via ORAL
  Filled 2018-07-28 (×2): qty 1

## 2018-07-28 MED ORDER — ATORVASTATIN CALCIUM 20 MG PO TABS
20.0000 mg | ORAL_TABLET | Freq: Every day | ORAL | Status: DC
  Administered 2018-07-28: 20 mg via ORAL
  Filled 2018-07-28: qty 1

## 2018-07-28 MED ORDER — MIRTAZAPINE 7.5 MG PO TABS
15.0000 mg | ORAL_TABLET | Freq: Every day | ORAL | Status: DC
  Administered 2018-07-28: 15 mg via ORAL
  Filled 2018-07-28: qty 2

## 2018-07-28 MED ORDER — ASPIRIN EC 81 MG PO TBEC
81.0000 mg | DELAYED_RELEASE_TABLET | Freq: Every day | ORAL | Status: DC
  Administered 2018-07-28: 81 mg via ORAL
  Filled 2018-07-28: qty 1

## 2018-07-28 MED ORDER — ALPRAZOLAM 0.25 MG PO TABS
0.2500 mg | ORAL_TABLET | Freq: Two times a day (BID) | ORAL | Status: DC | PRN
  Administered 2018-07-28: 0.25 mg via ORAL
  Filled 2018-07-28: qty 1

## 2018-07-28 MED ORDER — HYPROMELLOSE (GONIOSCOPIC) 2.5 % OP SOLN: 1.0000 [drp] | Freq: Three times a day (TID) | OPHTHALMIC | Status: DC | PRN

## 2018-07-28 NOTE — ED Triage Notes (Addendum)
EMS reports from home, Pt c/o of being unstable and weak this morning, states she has not eaten for several days. Hx of COPD. Pt ambulatory on arrival, walked without assistance to bathroom.  BP 150/80 HR 72 RR 16 Sp02 90 RA CBG 103  20 R hand

## 2018-07-28 NOTE — H&P (Signed)
History and Physical   Patient: Amy [name]                            PCP: Aletha Halim., PA-C                    DOB: Mar 17, 1935            DOA: 07/28/2018 YNW:[id number]             DOS: 07/28/2018, 11:50 AM  Patient coming from:   HOME  I have personally reviewed patient's medical records, in electronic medical records, including: [facility], and care everywhere.    Chief Complaint:   Chief Complaint  Patient presents with  . Fatigue  . Failure To Thrive    History of present illness:    Amy [name] is a 83 y.o. female with medical history significant of multiple comorbidities including: Anxiety,? P- A. Fib (not anticoagulated), OA, RA, COPD, not O2 dependent, GERD,HLD/ HTN, neuropathy, history of esophageal stricture, h/o SBO presenting With progressive generalized weaknesses, poor p.o. intake, urinary symptoms. Impression lives at home independently, not O2 dependent   Patient Denies having: Denies of having any upper respiratory symptoms, sick contact. Denies of having any fever, Chills, Cough, SOB, Chest Pain, Abd pain, N/V/D, headache, dizziness, lightheadedness, joint pain, rash, open wounds  ED Course:   ED evaluation: Vitals stable, temp 97.9, 71, 16, 150/92, 95% on room air, CBC WBC 9.2, hemoglobin 11.8, hematocrit 37.8, CMP reviewed sodium 131, BUN 9, creatinine 0.84 UA positive for leukocyte Estrace, negative nitrites, many bacteria, WBC 6-10 Chest x-ray reviewed within normal limit. SARS-CoV-2 negative   ED physician requested admission as he tolerated the patient is dehydrated, UTI, generalized weaknesses needs admission for at least observation and evaluation.   Review of Systems: As per HPI, otherwise 10 point review of systems were negative.   ----------------------------------------------------------------------------------------------------------------------  Allergies  Allergen Reactions  . Codeine Other (See Comments)    Reaction:   Hallucinations    Home MEDs:  Prior to Admission medications   Medication Sig Start Date End Date Taking? Authorizing Provider  ALPRAZolam (XANAX) 0.25 MG tablet Take 0.25 mg by mouth 2 (two) times daily.     [provider]  aspirin EC 81 MG tablet Take 81 mg by mouth at bedtime.    [provider]  atorvastatin (LIPITOR) 20 MG tablet Take 20 mg by mouth at bedtime.    [provider]  budesonide-formoterol (SYMBICORT) 160-4.5 MCG/ACT inhaler Inhale 2 puffs into the lungs 2 (two) times daily as needed (for shortness of breath).     Tanda Rockers, MD  Cholecalciferol (VITAMIN D3) 2000 UNITS TABS Take 2,000 Units by mouth at bedtime.     [provider]  fluticasone (FLONASE) 50 MCG/ACT nasal spray Place 1-2 sprays into both nostrils daily as needed for rhinitis.     [provider]  gabapentin (NEURONTIN) 100 MG capsule TAKE 1 CAP EVERY MORNING, 1 CAP IN THE AFTERNOON, AND 2 CAPS EVERY DAY AT BEDTIME 04/15/18   Sater, Nanine Means, MD  hydroxypropyl methylcellulose / hypromellose (ISOPTO TEARS / GONIOVISC) 2.5 % ophthalmic solution Place 1-2 drops into both eyes 3 (three) times daily as needed (to soothe dry eyes.).    [provider]  levothyroxine (SYNTHROID, LEVOTHROID) 75 MCG tablet Take 1 tablet (75 mcg total) by mouth daily before breakfast. 03/27/16   Orson Eva, MD  metoprolol  tartrate (LOPRESSOR) 25 MG tablet Take 0.5 tablets (12.5 mg total) by mouth 2 (two) times daily. 03/26/16   Orson Eva, MD  mirtazapine (REMERON) 15 MG tablet Take 15 mg by mouth at bedtime.     [provider]  ondansetron (ZOFRAN ODT) 4 MG disintegrating tablet Take 1 tablet (4 mg total) by mouth every 8 (eight) hours as needed for nausea or vomiting. 02/03/17   Ward, Ozella Almond, PA-C  pantoprazole (PROTONIX) 40 MG tablet Take 40 mg by mouth 2 (two) times daily.     [provider]  Potassium 99 MG TABS Take 99 mg by mouth at bedtime.    [provider]  traMADol (ULTRAM) 50 MG tablet Take 50 mg by mouth 2 (two) times daily.    [provider]    PRN MEDs: acetaminophen **OR** acetaminophen, fluticasone, hydroxypropyl methylcellulose / hypromellose, ketorolac, levalbuterol, magnesium citrate, ondansetron **OR** ondansetron (ZOFRAN) IV, senna-docusate, sorbitol, traZODone  Past Medical History:  Diagnosis Date  . Anxiety   . Arthritis   . Atrial fibrillation ([place])   . COPD (chronic obstructive pulmonary disease) ([place])   . Esophageal stricture    s/p repeated dilations, Dr. Watt Climes  . GERD (gastroesophageal reflux disease)   . Hearing loss bilateral   has hearing aids  . Heart murmur   . Hiatal hernia   . History of blood transfusion    many years ago   . Hyperlipidemia   . Hypothyroidism   . Osteoarthritis   . Osteoporosis   . Pulmonary nodules   . Renal lesion    1cm left kidney    Past Surgical History:  Procedure Laterality Date  . ABDOMINAL HYSTERECTOMY    . BREAST ENHANCEMENT SURGERY  2006  . CATARACT EXTRACTION W/ INTRAOCULAR LENS  IMPLANT, BILATERAL  '09  . ESOPHAGEAL DILATION    . KNEE ARTHROSCOPY  02/22/2011   Procedure: ARTHROSCOPY KNEE;  Surgeon: Gearlean Alf, MD;  Location: [facility];  Service: Orthopedics;  Laterality: Right;  WITH DEBRIDEMENT   . LAPAROSCOPIC SMALL BOWEL RESECTION N/A 06/02/2016   Procedure: DIAGNOSTIC LAPAROSCOPY SMALL BOWEL RESECTION;  Surgeon: Armandina Gemma, MD;  Location: [facility];  Service: General;  Laterality: N/A;     reports that she quit smoking about 21 years ago. Her smoking use included cigarettes. She has a 30.00 pack-year smoking history. She has never used smokeless tobacco. She reports that she does not drink alcohol or use drugs.   Family History  Problem Relation Age of Onset  . Heart disease Father   . Rheum arthritis Father   . Heart failure Mother   . Diabetes Brother     Physical Exam:   Vitals:   07/28/18 0733 07/28/18  0900 07/28/18 0930 07/28/18 1059  BP: 137/64 133/70 (!) 166/77 (!) 150/92  Pulse: 75 66 73 71  Resp: 16 14 15 16   Temp: 97.9 F (36.6 C)     TempSrc: Oral     SpO2: 92% 92% 93% 95%  Weight: 68 kg     Height: 5\' 5"  (1.651 m)      Constitutional: NAD, calm, comfortable Eyes: PERRL, lids and conjunctivae normal ENT: Mucous membranes are moist. Posterior pharynx clear of any exudate or lesions.Normal dentition.  Neck: normal, supple, no masses, no thyromegaly Respiratory: clear to auscultation bilaterally, no wheezing, no crackles. Normal respiratory effort. No accessory muscle use.  Cardiovascular: Regular rate and rhythm, no murmurs / rubs / gallops. No extremity edema. 2+ pedal pulses.  No carotid bruits.  Abdomen: no tenderness, no masses palpated. No hepatosplenomegaly. Bowel sounds positive.  Musculoskeletal: Severe generalized weaknesses, able to move all 4 extremities in bed.  No clubbing / cyanosis. No joint deformity upper and lower extremities. Good ROM, no contractures. Normal muscle tone.  Neurologic: CN II-XII grossly intact. Sensation intact, DTR normal. Strength 5/5 in all 4.  Psychiatric: Normal judgment and insight. Alert and oriented x 3. Normal mood.  Skin: no rashes, lesions, ulcers. No induration   Labs on admission:    I have personally reviewed following labs and imaging studies  CBC: Recent Labs  Lab 07/28/18 0814  WBC 9.2  NEUTROABS 3.9  HGB 11.8*  HCT 37.8  MCV 100.0  PLT 382   Basic Metabolic Panel: Recent Labs  Lab 07/28/18 0814  NA 131*  K 4.2  CL 92*  CO2 29  GLUCOSE 104*  BUN 9  CREATININE 0.84  CALCIUM 8.3*   GFR: Estimated Creatinine Clearance: 47.3 mL/min (by C-G formula based on SCr of 0.84 mg/dL). Liver Function Tests: Recent Labs  Lab 07/28/18 0814  AST 34  ALT 19  ALKPHOS 99  BILITOT 0.7  PROT 7.2  ALBUMIN 3.5  Urine analysis:    Component Value Date/Time   COLORURINE YELLOW 07/28/2018 0814   APPEARANCEUR HAZY (A)  07/28/2018 0814   LABSPEC 1.005 07/28/2018 0814   PHURINE 6.0 07/28/2018 0814   GLUCOSEU NEGATIVE 07/28/2018 0814   HGBUR MODERATE (A) 07/28/2018 0814   BILIRUBINUR NEGATIVE 07/28/2018 0814   [place] 07/28/2018 0814   PROTEINUR NEGATIVE 07/28/2018 0814   UROBILINOGEN 0.2 12/14/2012 0541   NITRITE NEGATIVE 07/28/2018 0814   LEUKOCYTESUR TRACE (A) 07/28/2018 0814     Radiologic Exams on Admission:   Dg Chest 2 View  Result Date: 07/28/2018 CLINICAL DATA:  Weakness and instability this morning. The patient reports not eating for several days. EXAM: CHEST - 2 VIEW COMPARISON:  Chest dated 05/30/2016. FINDINGS: Mild-to-moderate enlargement of the cardiac silhouette with an interval increase in size. Mildly prominent pulmonary vasculature. The lungs remain hyperexpanded with stable mild prominence of the interstitial markings. No pleural fluid. Unremarkable bones. Upper abdominal surgical clips. IMPRESSION: 1. Interval mild to moderate cardiomegaly and mild pulmonary vascular congestion. 2. Stable changes of COPD. Electronically Signed   By: Claudie Revering M.D.   On: 07/28/2018 10:07    EKG:   Independently reviewed.   Orders placed or performed during the hospital encounter of 07/28/18  . EKG 12-Lead  . EKG 12-Lead  . EKG 12-Lead  . EKG 12-Lead  . EKG 12-Lead   --------------------------------------------------------------------------------------------------------------------------    Assessment / Plan:   Principal Problem:   UTI (urinary tract infection) Active Problems:   Weakness   COPD GOLD III   GERD (gastroesophageal reflux disease)   Hypothyroidism   Arthritis   Osteoarthritis   Osteoporosis   COPD (chronic obstructive pulmonary disease) (HCC)   Anxiety   Hyperlipidemia   Normocytic anemia   Dehydration  h/o  Atrial fibrillation (H[facility])  Principal Problem:   UTI (urinary tract infection) -Patient will be admitted for general observation -Continue IV  Rocephin, -We will follow with urine culture and narrow down antibiotics accordingly  Acute on chronic progressive weakness -Patient apparently lives alone, no history of recent falls, no traumatic injuries -Continue to monitor closely, fall precautions -We will consult PT/OT for evaluation and recommendations  Dehydration/poor p.o. intake -We will continue gentle IV fluid hydration -We will monitor closely -We will encourage p.o. intake,  Active Problems:    COPD GOLD III  -not O2 dependent at baseline, stable PRN DuoNeb, O2 via nasal cannula    GERD (gastroesophageal reflux disease) -Continue PPI    Hypothyroidism -Continue home dose Synthroid -We will check TSH  History of : arthritis / Osteoarthritis / Osteoporosis -Continue PRN analgesics  Depression/anxiety -Continue home medication of Remeron, PRN Xanax       Normocytic anemia -Currently stable, monitoring H&H  Hyperlipidemia  -Continue statins  hypertension -Currently mild elevated blood pressure, continue current home medication of Lopressor -PRN IV hydralazine -If she remains hypertensive, prior to discharge titration of blood pressure, additional medication may be needed   H/o  P- Atrial fibrillation ([place]) -Patient is currently on aspirin, statins, beta-blockers -2 ECG on this admission was reviewed personally, both in normal sinus rhythm  Electronic history reports A. fib with no details -Last echo 03/24/2016 -reporting A. Fib, ED J EF 60-65% -For some reason patient has not on chronic anticoagulation therapy -Chads 2 Vasc = at least 4, HAS-BLED = 2 -Patient is to be monitored closely, chronic anticoagulation may be considered Patient does not know why she is not on chronic anticoagulation, immediate electronic records were reviewed no obvious evidence. -We will consider prior to discharge -Echo ordered    DVT prophylaxis: SCD/Compression stockings and Lovenox SQ Code Status:   Code Status: Full  Code  Family Communication:  The above findings and plan of care has been discussed with patient in detail, they expressed understanding and agreement of above plan.   Disposition Plan:  Anticipated 1-2 days Consults called:  None  Admission status: Patient will be admitted as Observation, with a less than 2 midnight length of stay.   Cultures:  -Urine cultures Antimicrobial: -07/28/2018 Rocephin IV >>>  Imaging: 2D echo 03/24/2016 -Impressions:  - The patient was in atrial fibrillation. Normal LV size with EF   60-65%. Normal RV size and systolic function. Moderate pulmonary   hypertension. Dilated IVC suggesting elevated RV filling   pressure.   CHEST - 2 VIEW IMPRESSION: 1. Interval mild to moderate cardiomegaly and mild pulmonary vascular congestion. 2. Stable changes of COPD.  ---------------------------------------------------------------------------------------------------------------------------------------------------------------------------------------------------------------------------------------  Time spent: > than  66  Min.   SIGNED: Deatra James, MD, FACP, FHM. [facility],  Pager [phone]  If 7PM-7AM, please contact night-coverage [email or web address] 07/28/2018, 11:50 AM

## 2018-07-28 NOTE — ED Notes (Signed)
Pt ambulated to bathroom, given warm blanket

## 2018-07-28 NOTE — ED Notes (Signed)
ED TO INPATIENT HANDOFF REPORT  Name/Age/Gender Amy [name] 83 y.o. female  Code Status    Code Status Orders  (From admission, onward)         Start     Ordered   07/28/18 1131  Full code  Continuous     07/28/18 1134        Code Status History    Date Active Date Inactive Code Status Order ID Comments User Context   06/02/2016 1053 06/07/2016 1628 Full Code [id number]  Armandina Gemma, MD Inpatient   03/22/2016 1627 03/26/2016 1859 DNR [id number]  Hosie Poisson, MD Inpatient   01/25/2016 1014 01/27/2016 1611 DNR [id number]  Barton Dubois, MD Inpatient   12/11/2012 2057 12/14/2012 1512 Full Code [id number]  Leighton Ruff, MD Inpatient   Advance Care Planning Activity      Home/SNF/Other Home  Chief Complaint Generalized weakness  Level of Care/Admitting Diagnosis ED Disposition    ED Disposition Condition [facility] Area: [facility] [100102]  Level of Care: Telemetry [5]  Admit to tele based on following criteria: Other see comments  Comments: A-fib  Covid Evaluation: Confirmed COVID Negative  Diagnosis: UTI (urinary tract infection) [884166]  Admitting Physician: Acquanetta Sit  Attending Physician: Acquanetta Sit  PT Class (Do Not Modify): Observation [104]  PT Acc Code (Do Not Modify): Observation [10022]       Medical History Past Medical History:  Diagnosis Date  . Anxiety   . Arthritis   . Atrial fibrillation ([place])   . COPD (chronic obstructive pulmonary disease) ([place])   . Esophageal stricture    s/p repeated dilations, Dr. Watt Climes  . GERD (gastroesophageal reflux disease)   . Hearing loss bilateral   has hearing aids  . Heart murmur   . Hiatal hernia   . History of blood transfusion    many years ago   . Hyperlipidemia   . Hypothyroidism   . Osteoarthritis   . Osteoporosis   . Pulmonary nodules   . Renal lesion    1cm left kidney    Allergies Allergies  Allergen Reactions  . Codeine  Other (See Comments)    Reaction:  Hallucinations    IV Location/Drains/Wounds Patient Lines/Drains/Airways Status   Active Line/Drains/Airways    Name:   Placement date:   Placement time:   Site:   Days:   Peripheral IV 07/28/18 Anterior;Right Hand   07/28/18    0734    Hand   less than 1   Incision (Closed) 06/02/16 Abdomen Other (Comment)   06/02/16    0846     786   Incision - 1 Port Abdomen 1: Left;Upper   06/02/16    0800     786          Labs/Imaging Results for orders placed or performed during the hospital encounter of 07/28/18 (from the past 48 hour(s))  CBC with Differential/Platelet     Status: Abnormal   Collection Time: 07/28/18  8:14 AM  Result Value Ref Range   WBC 9.2 4.0 - 10.5 K/uL   RBC 3.78 (L) 3.87 - 5.11 MIL/uL   Hemoglobin 11.8 (L) 12.0 - 15.0 g/dL   HCT 37.8 36.0 - 46.0 %   MCV 100.0 80.0 - 100.0 fL   MCH 31.2 26.0 - 34.0 pg   MCHC 31.2 30.0 - 36.0 g/dL   RDW 13.2 11.5 - 15.5 %   Platelets 352 150 - 400 K/uL  nRBC 0.0 0.0 - 0.2 %   Neutrophils Relative % 42 %   Neutro Abs 3.9 1.7 - 7.7 K/uL   Lymphocytes Relative 42 %   Lymphs Abs 3.9 0.7 - 4.0 K/uL   Monocytes Relative 14 %   Monocytes Absolute 1.3 (H) 0.1 - 1.0 K/uL   Eosinophils Relative 0 %   Eosinophils Absolute 0.0 0.0 - 0.5 K/uL   Basophils Relative 1 %   Basophils Absolute 0.1 0.0 - 0.1 K/uL   Immature Granulocytes 1 %   Abs Immature Granulocytes 0.05 0.00 - 0.07 K/uL    Comment: Performed at [facility], [place] [address] [place], [place] [address]  Comprehensive metabolic panel     Status: Abnormal   Collection Time: 07/28/18  8:14 AM  Result Value Ref Range   Sodium 131 (L) 135 - 145 mmol/L   Potassium 4.2 3.5 - 5.1 mmol/L   Chloride 92 (L) 98 - 111 mmol/L   CO2 29 22 - 32 mmol/L   Glucose, Bld 104 (H) 70 - 99 mg/dL   BUN 9 8 - 23 mg/dL   Creatinine, Ser 0.84 0.44 - 1.00 mg/dL   Calcium 8.3 (L) 8.9 - 10.3 mg/dL   Total Protein 7.2 6.5 - 8.1 g/dL   Albumin  3.5 3.5 - 5.0 g/dL   AST 34 15 - 41 U/L   ALT 19 0 - 44 U/L   Alkaline Phosphatase 99 38 - 126 U/L   Total Bilirubin 0.7 0.3 - 1.2 mg/dL   GFR calc non Af Amer >60 >60 mL/min   GFR calc Af Amer >60 >60 mL/min   Anion gap 10 5 - 15    Comment: Performed at [facility], [place] [address] [place], [place] [address]  Urinalysis, Routine w reflex microscopic     Status: Abnormal   Collection Time: 07/28/18  8:14 AM  Result Value Ref Range   Color, Urine YELLOW YELLOW   APPearance HAZY (A) CLEAR   Specific Gravity, Urine 1.005 1.005 - 1.030   pH 6.0 5.0 - 8.0   Glucose, UA NEGATIVE NEGATIVE mg/dL   Hgb urine dipstick MODERATE (A) NEGATIVE   Bilirubin Urine NEGATIVE NEGATIVE   Ketones, ur NEGATIVE NEGATIVE mg/dL   Protein, ur NEGATIVE NEGATIVE mg/dL   Nitrite NEGATIVE NEGATIVE   Leukocytes,Ua TRACE (A) NEGATIVE   RBC / HPF 6-10 0 - 5 RBC/hpf   WBC, UA 6-10 0 - 5 WBC/hpf   Bacteria, UA MANY (A) NONE SEEN   Squamous Epithelial / LPF 0-5 0 - 5   Crystals PRESENT (A) NEGATIVE    Comment: Performed at [facility], [place] [address] [place], [place] [address]  Troponin I (High Sensitivity)     Status: None   Collection Time: 07/28/18  8:14 AM  Result Value Ref Range   Troponin I (High Sensitivity) 7 <18 ng/L    Comment: (NOTE) Elevated high sensitivity troponin I (hsTnI) values and significant  changes across serial measurements may suggest ACS but many other  chronic and acute conditions are known to elevate hsTnI results.  Refer to the "Links" section for chest pain algorithms and additional  guidance. Performed at [facility], [place] [address], [place], [place] [address]   Magnesium     Status: None   Collection Time: 07/28/18  8:14 AM  Result Value Ref Range   Magnesium 2.0 1.7 - 2.4 mg/dL    Comment: Performed at [facility],  [place] [address] [place], [place] [address]  Phosphorus     Status: Abnormal    Collection Time: 07/28/18  8:14 AM  Result Value Ref Range   Phosphorus 2.3 (L) 2.5 - 4.6 mg/dL    Comment: Performed at [facility], [place] [address] [place], [place] [address]  SARS Coronavirus 2 (CEPHEID- Performed in [place] hospital lab), Hosp Order     Status: None   Collection Time: 07/28/18  8:15 AM   Specimen: Nasopharyngeal Swab  Result Value Ref Range   SARS Coronavirus 2 NEGATIVE NEGATIVE    Comment: (NOTE) If result is NEGATIVE SARS-CoV-2 target nucleic acids are NOT DETECTED. The SARS-CoV-2 RNA is generally detectable in upper and lower  respiratory specimens during the acute phase of infection. The lowest  concentration of SARS-CoV-2 viral copies this assay can detect is 250  copies / mL. A negative result does not preclude SARS-CoV-2 infection  and should not be used as the sole basis for treatment or other  patient management decisions.  A negative result may occur with  improper specimen collection / handling, submission of specimen other  than nasopharyngeal swab, presence of viral mutation(s) within the  areas targeted by this assay, and inadequate number of viral copies  (<250 copies / mL). A negative result must be combined with clinical  observations, patient history, and epidemiological information. If result is POSITIVE SARS-CoV-2 target nucleic acids are DETECTED. The SARS-CoV-2 RNA is generally detectable in upper and lower  respiratory specimens dur ing the acute phase of infection.  Positive  results are indicative of active infection with SARS-CoV-2.  Clinical  correlation with patient history and other diagnostic information is  necessary to determine patient infection status.  Positive results do  not rule out bacterial infection or co-infection with other viruses. If result is PRESUMPTIVE POSTIVE SARS-CoV-2 nucleic acids MAY BE PRESENT.   A presumptive positive result was obtained on the submitted specimen  and confirmed on  repeat testing.  While 2019 novel coronavirus  (SARS-CoV-2) nucleic acids may be present in the submitted sample  additional confirmatory testing may be necessary for epidemiological  and / or clinical management purposes  to differentiate between  SARS-CoV-2 and other Sarbecovirus currently known to infect humans.  If clinically indicated additional testing with an alternate test  methodology [id number]) is advised. The SARS-CoV-2 RNA is generally  detectable in upper and lower respiratory sp ecimens during the acute  phase of infection. The expected result is Negative. Fact Sheet for Patients:  StrictlyIdeas.no Fact Sheet for Healthcare Providers: BankingDealers.co.za This test is not yet approved or cleared by the [place] FDA and has been authorized for detection and/or diagnosis of SARS-CoV-2 by FDA under an Emergency Use Authorization (EUA).  This EUA will remain in effect (meaning this test can be used) for the duration of the COVID-19 declaration under Section 564(b)(1) of the Act, 21 U.S.C. section 360bbb-3(b)(1), unless the authorization is terminated or revoked sooner. Performed at [facility], [place] [address], [place], [place] [address]   Troponin I (High Sensitivity)     Status: None   Collection Time: 07/28/18 10:14 AM  Result Value Ref Range   Troponin I (High Sensitivity) 6.0 <18 ng/L    Comment: (NOTE) Elevated high sensitivity troponin I (hsTnI) values and significant  changes across serial measurements may suggest ACS but many other  chronic and acute conditions are known to elevate hsTnI results.  Refer to the "Links" section for chest  pain algorithms and additional  guidance. Performed at [facility], [place] [address] [place], [place] [address]    Dg Chest 2 View  Result Date: 07/28/2018 CLINICAL DATA:  Weakness and instability this morning. The patient reports not  eating for several days. EXAM: CHEST - 2 VIEW COMPARISON:  Chest dated 05/30/2016. FINDINGS: Mild-to-moderate enlargement of the cardiac silhouette with an interval increase in size. Mildly prominent pulmonary vasculature. The lungs remain hyperexpanded with stable mild prominence of the interstitial markings. No pleural fluid. Unremarkable bones. Upper abdominal surgical clips. IMPRESSION: 1. Interval mild to moderate cardiomegaly and mild pulmonary vascular congestion. 2. Stable changes of COPD. Electronically Signed   By: Claudie Revering M.D.   On: 07/28/2018 10:07    Pending Labs Unresulted Labs (From admission, onward)    Start     Ordered   07/29/18 5038  Basic metabolic panel  Daily,   R     07/28/18 1134   07/29/18 0500  CBC  Daily,   R     07/28/18 1134   07/29/18 0500  Protime-INR  Tomorrow morning,   R     07/28/18 1134   07/29/18 0500  APTT  Tomorrow morning,   R     07/28/18 1134   07/28/18 1131  Urine culture  Once,   STAT     07/28/18 1134   07/28/18 1131  Brain natriuretic peptide  Once,   STAT     07/28/18 1134   07/28/18 1131  TSH  Once,   STAT     07/28/18 1134   07/28/18 1131  Hemoglobin A1c  Once,   STAT     07/28/18 1134   07/28/18 1104  Urine culture  ONCE - STAT,   STAT     07/28/18 1103          Vitals/Pain Today's Vitals   07/28/18 0900 07/28/18 0930 07/28/18 1059 07/28/18 1150  BP: 133/70 (!) 166/77 (!) 150/92 (!) 155/67  Pulse: 66 73 71 76  Resp: 14 15 16 16   Temp:      TempSrc:      SpO2: 92% 93% 95% 95%  Weight:      Height:      PainSc:        Isolation Precautions No active isolations  Medications Medications  sodium chloride flush (NS) 0.9 % injection 3 mL (3 mLs Intravenous Given 07/28/18 1149)  0.9 %  sodium chloride infusion (has no administration in time range)  acetaminophen (TYLENOL) tablet 650 mg (has no administration in time range)    Or  acetaminophen (TYLENOL) suppository 650 mg (has no administration in time range)   ketorolac (TORADOL) 15 MG/ML injection 15 mg (has no administration in time range)  traZODone (DESYREL) tablet 50 mg (has no administration in time range)  docusate sodium (COLACE) capsule 100 mg (has no administration in time range)  senna-docusate (Senokot-S) tablet 1 tablet (has no administration in time range)  sorbitol 70 % solution 30 mL (has no administration in time range)  magnesium citrate solution 1 Bottle (has no administration in time range)  ondansetron (ZOFRAN) tablet 4 mg (has no administration in time range)    Or  ondansetron (ZOFRAN) injection 4 mg (has no administration in time range)  levalbuterol (XOPENEX) nebulizer solution 0.63 mg (has no administration in time range)  cefTRIAXone (ROCEPHIN) 1 g in sodium chloride 0.9 % 100 mL IVPB (has no administration in time range)  aspirin EC tablet 81 mg (has  no administration in time range)  atorvastatin (LIPITOR) tablet 20 mg (has no administration in time range)  metoprolol tartrate (LOPRESSOR) tablet 12.5 mg (has no administration in time range)  ALPRAZolam (XANAX) tablet 0.25 mg (has no administration in time range)  mirtazapine (REMERON) tablet 15 mg (has no administration in time range)  levothyroxine (SYNTHROID) tablet 75 mcg (has no administration in time range)  pantoprazole (PROTONIX) EC tablet 40 mg (has no administration in time range)  gabapentin (NEURONTIN) capsule 100 mg (has no administration in time range)  Vitamin D3 TABS 2,000 Units (has no administration in time range)  fluticasone furoate-vilanterol (BREO ELLIPTA) 200-25 MCG/INH 1 puff (has no administration in time range)  fluticasone (FLONASE) 50 MCG/ACT nasal spray 1-2 spray (has no administration in time range)  hydroxypropyl methylcellulose / hypromellose (ISOPTO TEARS / GONIOVISC) 2.5 % ophthalmic solution 1-2 drop (has no administration in time range)  enoxaparin (LOVENOX) injection 40 mg (has no administration in time range)  cefTRIAXone (ROCEPHIN)  1 g in sodium chloride 0.9 % 100 mL IVPB (0 g Intravenous Stopped 07/28/18 1221)    Mobility walks

## 2018-07-28 NOTE — ED Provider Notes (Signed)
[facility] DEPT Provider Note   CSN: [id number] Arrival date & time: 07/28/18  0725     History   Chief Complaint Chief Complaint  Patient presents with  . Fatigue  . Failure To Thrive    HPI Amy [name] is a 83 y.o. female.     Patient complains of feeling weak unsteady when she walks.  She has not been eating or drinking for a few days.  The history is provided by the patient. No language interpreter was used.  Weakness Severity:  Moderate Onset quality:  Sudden Timing:  Constant Progression:  Worsening Chronicity:  New Context: not alcohol use   Relieved by:  Nothing Worsened by:  Nothing Ineffective treatments:  None tried Associated symptoms: no abdominal pain, no chest pain, no cough, no diarrhea, no frequency, no headaches and no seizures     Past Medical History:  Diagnosis Date  . Anxiety   . Arthritis   . Atrial fibrillation ([place])   . COPD (chronic obstructive pulmonary disease) ([place])   . Esophageal stricture    s/p repeated dilations, Dr. Watt Climes  . GERD (gastroesophageal reflux disease)   . Hearing loss bilateral   has hearing aids  . Heart murmur   . Hiatal hernia   . History of blood transfusion    many years ago   . Hyperlipidemia   . Hypothyroidism   . Osteoarthritis   . Osteoporosis   . Pulmonary nodules   . Renal lesion    1cm left kidney    Patient Active Problem List   Diagnosis Date Noted  . UTI (urinary tract infection) 07/28/2018  . Numbness and tingling of both feet 03/20/2018  . Polyneuropathy 03/20/2018  . Gait disturbance 03/20/2018  . Partial small bowel obstruction ([place]) 06/02/2016  . Small bowel obstruction, partial ([place]) 05/29/2016  . Atrial fibrillation ([place]) 03/24/2016  . Lobar pneumonia ([place]) 03/23/2016  . Aspiration pneumonia of right lung due to vomit ([place]) 03/23/2016  . Acute respiratory failure with hypoxia ([place]) 03/23/2016  . Sepsis ([place])   . CAP (community acquired  pneumonia) 03/22/2016  . Pneumonia 03/22/2016  . Viral gastroenteritis 01/25/2016  . Dehydration 01/25/2016  . Leukocytosis 01/25/2016  . Hypokalemia 01/25/2016  . Hyperglycemia 01/25/2016  . Intractable nausea and vomiting 01/25/2016  . Epigastric pain   . Normocytic anemia 12/12/2012  . GERD (gastroesophageal reflux disease)   . Hypothyroidism   . Arthritis   . Renal lesion   . Osteoarthritis   . Osteoporosis   . COPD (chronic obstructive pulmonary disease) ([place])   . Esophageal stricture   . Pulmonary nodules   . Anxiety   . Hyperlipidemia   . Biliary colic 74/25/9563  . Multiple pulmonary nodules 04/24/2012  . COPD GOLD III 04/23/2012  . Abdominal pain, unspecified site 04/23/2012  . Medial meniscus tear 02/22/2011    Past Surgical History:  Procedure Laterality Date  . ABDOMINAL HYSTERECTOMY    . BREAST ENHANCEMENT SURGERY  2006  . CATARACT EXTRACTION W/ INTRAOCULAR LENS  IMPLANT, BILATERAL  '09  . ESOPHAGEAL DILATION    . KNEE ARTHROSCOPY  02/22/2011   Procedure: ARTHROSCOPY KNEE;  Surgeon: Gearlean Alf, MD;  Location[facility];  Service: Orthopedics;  Laterality: Right;  WITH DEBRIDEMENT   . LAPAROSCOPIC SMALL BOWEL RESECTION N/A 06/02/2016   Procedure: DIAGNOSTIC LAPAROSCOPY SMALL BOWEL RESECTION;  Surgeon: Armandina Gemma, MD;  Location: [facility];  Service: General;  Laterality: N/A;     OB History  No obstetric history on file.      Home Medications    Prior to Admission medications   Medication Sig Start Date End Date Taking? Authorizing Provider  ALPRAZolam (XANAX) 0.25 MG tablet Take 0.25 mg by mouth 2 (two) times daily.     [provider]  aspirin EC 81 MG tablet Take 81 mg by mouth at bedtime.    [provider]  atorvastatin (LIPITOR) 20 MG tablet Take 20 mg by mouth at bedtime.    [provider]  budesonide-formoterol (SYMBICORT) 160-4.5 MCG/ACT inhaler Inhale 2 puffs into the lungs 2 (two) times daily  as needed (for shortness of breath).     Tanda Rockers, MD  Cholecalciferol (VITAMIN D3) 2000 UNITS TABS Take 2,000 Units by mouth at bedtime.     [provider]  fluticasone (FLONASE) 50 MCG/ACT nasal spray Place 1-2 sprays into both nostrils daily as needed for rhinitis.     [provider]  gabapentin (NEURONTIN) 100 MG capsule TAKE 1 CAP EVERY MORNING, 1 CAP IN THE AFTERNOON, AND 2 CAPS EVERY DAY AT BEDTIME 04/15/18   Sater, Nanine Means, MD  hydroxypropyl methylcellulose / hypromellose (ISOPTO TEARS / GONIOVISC) 2.5 % ophthalmic solution Place 1-2 drops into both eyes 3 (three) times daily as needed (to soothe dry eyes.).    [provider]  levothyroxine (SYNTHROID, LEVOTHROID) 75 MCG tablet Take 1 tablet (75 mcg total) by mouth daily before breakfast. 03/27/16   Tat, Shanon Brow, MD  metoprolol tartrate (LOPRESSOR) 25 MG tablet Take 0.5 tablets (12.5 mg total) by mouth 2 (two) times daily. 03/26/16   Orson Eva, MD  mirtazapine (REMERON) 15 MG tablet Take 15 mg by mouth at bedtime.     [provider]  ondansetron (ZOFRAN ODT) 4 MG disintegrating tablet Take 1 tablet (4 mg total) by mouth every 8 (eight) hours as needed for nausea or vomiting. 02/03/17   Ward, Ozella Almond, PA-C  pantoprazole (PROTONIX) 40 MG tablet Take 40 mg by mouth 2 (two) times daily.     [provider]  Potassium 99 MG TABS Take 99 mg by mouth at bedtime.    [provider]  traMADol (ULTRAM) 50 MG tablet Take 50 mg by mouth 2 (two) times daily.    [provider]    Family History Family History  Problem Relation Age of Onset  . Heart disease Father   . Rheum arthritis Father   . Heart failure Mother   . Diabetes Brother     Social History Social History   Tobacco Use  . Smoking status: Former Smoker    Packs/day: 1.00    Years: 30.00    Pack years: 30.00    Types: Cigarettes    Quit date: 01/09/1997    Years since quitting: 21.5  . Smokeless tobacco:  Never Used  Substance Use Topics  . Alcohol use: No  . Drug use: No     Allergies   Codeine   Review of Systems Review of Systems  Constitutional: Negative for appetite change and fatigue.  HENT: Negative for congestion, ear discharge and sinus pressure.   Eyes: Negative for discharge.  Respiratory: Negative for cough.   Cardiovascular: Negative for chest pain.  Gastrointestinal: Negative for abdominal pain and diarrhea.  Genitourinary: Negative for frequency and hematuria.  Musculoskeletal: Negative for back pain.  Skin: Negative for rash.  Neurological: Positive for weakness. Negative for seizures and headaches.  Psychiatric/Behavioral: Negative for hallucinations.  Physical Exam Updated Vital Signs BP (!) 150/92   Pulse 71   Temp 97.9 F (36.6 C) (Oral)   Resp 16   Ht 5\' 5"  (1.651 m)   Wt 68 kg   SpO2 95%   BMI 24.96 kg/m   Physical Exam Vitals signs and nursing note reviewed.  Constitutional:      Appearance: She is well-developed.  HENT:     Head: Normocephalic.     Nose: Nose normal.  Eyes:     General: No scleral icterus.    Conjunctiva/sclera: Conjunctivae normal.  Neck:     Musculoskeletal: Neck supple.     Thyroid: No thyromegaly.  Cardiovascular:     Rate and Rhythm: Normal rate and regular rhythm.     Heart sounds: No murmur. No friction rub. No gallop.   Pulmonary:     Breath sounds: No stridor. No wheezing or rales.  Chest:     Chest wall: No tenderness.  Abdominal:     General: There is no distension.     Tenderness: There is no abdominal tenderness. There is no rebound.  Musculoskeletal: Normal range of motion.  Lymphadenopathy:     Cervical: No cervical adenopathy.  Skin:    Findings: No erythema or rash.  Neurological:     Mental Status: She is oriented to person, place, and time.     Motor: No abnormal muscle tone.     Coordination: Coordination normal.  Psychiatric:        Behavior: Behavior normal.      ED  Treatments / Results  Labs (all labs ordered are listed, but only abnormal results are displayed) Labs Reviewed  CBC WITH DIFFERENTIAL/PLATELET - Abnormal; Notable for the following components:      Result Value   RBC 3.78 (*)    Hemoglobin 11.8 (*)    Monocytes Absolute 1.3 (*)    All other components within normal limits  COMPREHENSIVE METABOLIC PANEL - Abnormal; Notable for the following components:   Sodium 131 (*)    Chloride 92 (*)    Glucose, Bld 104 (*)    Calcium 8.3 (*)    All other components within normal limits  URINALYSIS, ROUTINE W REFLEX MICROSCOPIC - Abnormal; Notable for the following components:   APPearance HAZY (*)    Hgb urine dipstick MODERATE (*)    Leukocytes,Ua TRACE (*)    Bacteria, UA MANY (*)    Crystals PRESENT (*)    All other components within normal limits  SARS CORONAVIRUS 2 (HOSPITAL ORDER, [facility])  URINE CULTURE  URINE CULTURE  CBC  CREATININE, SERUM  CALCIUM  MAGNESIUM  PHOSPHORUS  BRAIN NATRIURETIC PEPTIDE  TSH  HEMOGLOBIN A1C  TROPONIN I (HIGH SENSITIVITY)  TROPONIN I (HIGH SENSITIVITY)    EKG EKG Interpretation  Date/Time:  Sunday July 28 2018 08:23:49 EDT Ventricular Rate:  72 PR Interval:    QRS Duration: 106 QT Interval:  405 QTC Calculation: 444 R Axis:   73 Text Interpretation:  Sinus rhythm Low voltage, extremity and precordial leads Confirmed by Gerlene Fee [phone]), editor Philomena Doheny [phone]) on 07/28/2018 9:42:35 AM   Radiology Dg Chest 2 View  Result Date: 07/28/2018 CLINICAL DATA:  Weakness and instability this morning. The patient reports not eating for several days. EXAM: CHEST - 2 VIEW COMPARISON:  Chest dated 05/30/2016. FINDINGS: Mild-to-moderate enlargement of the cardiac silhouette with an interval increase in size. Mildly prominent pulmonary vasculature. The lungs remain hyperexpanded with stable mild  prominence of the interstitial markings. No pleural fluid. Unremarkable  bones. Upper abdominal surgical clips. IMPRESSION: 1. Interval mild to moderate cardiomegaly and mild pulmonary vascular congestion. 2. Stable changes of COPD. Electronically Signed   By: Claudie Revering M.D.   On: 07/28/2018 10:07    Procedures Procedures (including critical care time)  Medications Ordered in ED Medications  cefTRIAXone (ROCEPHIN) 1 g in sodium chloride 0.9 % 100 mL IVPB (has no administration in time range)  heparin injection 5,000 Units (has no administration in time range)  sodium chloride flush (NS) 0.9 % injection 3 mL (has no administration in time range)  0.9 %  sodium chloride infusion (has no administration in time range)  acetaminophen (TYLENOL) tablet 650 mg (has no administration in time range)    Or  acetaminophen (TYLENOL) suppository 650 mg (has no administration in time range)  ketorolac (TORADOL) 15 MG/ML injection 15 mg (has no administration in time range)  traZODone (DESYREL) tablet 50 mg (has no administration in time range)  docusate sodium (COLACE) capsule 100 mg (has no administration in time range)  senna-docusate (Senokot-S) tablet 1 tablet (has no administration in time range)  sorbitol 70 % solution 30 mL (has no administration in time range)  magnesium citrate solution 1 Bottle (has no administration in time range)  ondansetron (ZOFRAN) tablet 4 mg (has no administration in time range)    Or  ondansetron (ZOFRAN) injection 4 mg (has no administration in time range)  levalbuterol (XOPENEX) nebulizer solution 0.63 mg (has no administration in time range)     Initial Impression / Assessment and Plan / ED Course  I have reviewed the triage vital signs and the nursing notes.  Pertinent labs & imaging results that were available during my care of the patient were reviewed by me and considered in my medical decision making (see chart for details).        Patient with fatigue dehydration and urinary tract infection.  She will be admitted to  medicine for treatment of the UTI and hydration  Final Clinical Impressions(s) / ED Diagnoses   Final diagnoses:  Acute cystitis with hematuria    ED Discharge Orders    None       Milton Ferguson, MD 07/28/18 1137

## 2018-07-28 NOTE — ED Notes (Signed)
Patient ambulated to restroom, patient did not give another urine specimen for the urine culture. Will remind patient next time patient uses restroom.

## 2018-07-28 NOTE — Progress Notes (Signed)
PT Cancellation Note  Patient Details Name: Amy [name] MRN: [id number] DOB: [birth year]   Cancelled Treatment:     PT order received but eval deferred this date at request of pt 2* fatigue and ongoing diarrhea.  Will follow in am.   Emeka Lindner 07/28/2018, 3:46 PM

## 2018-07-28 NOTE — Evaluation (Signed)
Occupational Therapy Evaluation Patient Details Name: Amy [name] MRN: [id number] DOB: 09/16/1935 Today's Date: 07/28/2018    History of Present Illness 83 y o female was admitted for weakness and being unstable walking.  Found to be dehydrated and have a UTI. PMH:  COPD, OA, anxiety   Clinical Impression   Pt was admitted for the above. Pt is independent at baseline, including IADLs. She presents with unsteadiness and decreased activity tolerance. Will follow in acute with mod I level goals.      Follow Up Recommendations  Home health OT;Supervision - Intermittent(depending on progress)    Equipment Recommendations  (tba further)    Recommendations for Other Services       Precautions / Restrictions Precautions Precautions: Fall Restrictions Weight Bearing Restrictions: No      Mobility Bed Mobility Overal bed mobility: Independent                Transfers Overall transfer level: Needs assistance Equipment used: None Transfers: Sit to/from Stand Sit to Stand: Min guard         General transfer comment: for safety    Balance Overall balance assessment: Needs assistance             Standing balance comment: unsteady when walking without AD but did not lose balance                           ADL either performed or assessed with clinical judgement   ADL Overall ADL's : Needs assistance/impaired                                       General ADL Comments: set up and min guard for adl vs min A to ambulated in room (steadying) without RW.  Pt does not use an AD at baseline     Vision         Perception     Praxis      Pertinent Vitals/Pain Pain Assessment: No/denies pain     Hand Dominance     Extremity/Trunk Assessment Upper Extremity Assessment Upper Extremity Assessment: Overall WFL for tasks assessed           Communication Communication Communication: No difficulties   Cognition Arousal/Alertness:  Awake/alert Behavior During Therapy: WFL for tasks assessed/performed Overall Cognitive Status: Within Functional Limits for tasks assessed                                     General Comments  sats 93% on RA    Exercises     Shoulder Instructions      Home Living Family/patient expects to be discharged to:: Private residence Living Arrangements: Alone Available Help at Discharge: Family;Friend(s) Type of Home: House Home Access: Stairs to enter [employer] of Steps: 2         Bathroom Shower/Tub: (sliding doors)   Biochemist, clinical: Handicapped height     Home Equipment: None   Additional Comments: daughter lives nearby; pt has been doing her own shopping      Prior Functioning/Environment Level of Independence: Independent        Comments: drives        OT Problem List: Decreased strength;Decreased activity tolerance;Impaired balance (sitting and/or standing)      OT Treatment/Interventions: Self-care/ADL training;DME  and/or AE instruction;Balance training;Patient/family education;Therapeutic activities;Energy conservation    OT Goals(Current goals can be found in the care plan section) Acute Rehab OT Goals Patient Stated Goal: get strength back and return to independence OT Goal Formulation: With patient Time For Goal Achievement: 08/11/18 Potential to Achieve Goals: Good ADL Goals Pt Will Transfer to Toilet: with modified independence Pt Will Perform Toileting - Clothing Manipulation and hygiene: with modified independence Pt Will Perform Tub/Shower Transfer: Tub transfer;ambulating;with supervision;shower seat Additional ADL Goal #1: pt will gather clothes and complete adl at mod I level  OT Frequency: Min 2X/week   Barriers to D/C:            Co-evaluation              AM-PAC OT "6 Clicks" Daily Activity     Outcome Measure Help from another person eating meals?: None Help from another person taking care of  personal grooming?: A Little Help from another person toileting, which includes using toliet, bedpan, or urinal?: A Little Help from another person bathing (including washing, rinsing, drying)?: A Little Help from another person to put on and taking off regular upper body clothing?: A Little Help from another person to put on and taking off regular lower body clothing?: A Little 6 Click Score: 19   End of Session    Activity Tolerance: Patient limited by fatigue Patient left: in bed;with call bell/phone within reach;with bed alarm set  OT Visit Diagnosis: Unsteadiness on feet (R26.81);Muscle weakness (generalized) (M62.81)                Time: 5993-5701 OT Time Calculation (min): 22 min Charges:  OT General Charges $OT Visit: 1 Visit OT Evaluation $OT Eval Low Complexity: [place], OTR/L Acute Rehabilitation Services [phone] [facility] pager [phone] office 07/28/2018  [place] 07/28/2018, 3:29 PM

## 2018-07-28 NOTE — Plan of Care (Signed)

## 2018-07-29 ENCOUNTER — Observation Stay (HOSPITAL_BASED_OUTPATIENT_CLINIC_OR_DEPARTMENT_OTHER): Payer: Medicare Other

## 2018-07-29 DIAGNOSIS — F419 Anxiety disorder, unspecified: Secondary | ICD-10-CM | POA: Diagnosis not present

## 2018-07-29 DIAGNOSIS — I480 Paroxysmal atrial fibrillation: Secondary | ICD-10-CM

## 2018-07-29 DIAGNOSIS — I4891 Unspecified atrial fibrillation: Secondary | ICD-10-CM | POA: Diagnosis not present

## 2018-07-29 DIAGNOSIS — R531 Weakness: Secondary | ICD-10-CM

## 2018-07-29 DIAGNOSIS — N3000 Acute cystitis without hematuria: Secondary | ICD-10-CM | POA: Diagnosis not present

## 2018-07-29 DIAGNOSIS — M1990 Unspecified osteoarthritis, unspecified site: Secondary | ICD-10-CM

## 2018-07-29 DIAGNOSIS — K210 Gastro-esophageal reflux disease with esophagitis: Secondary | ICD-10-CM

## 2018-07-29 DIAGNOSIS — M169 Osteoarthritis of hip, unspecified: Secondary | ICD-10-CM

## 2018-07-29 DIAGNOSIS — E860 Dehydration: Secondary | ICD-10-CM

## 2018-07-29 LAB — PROTIME-INR
INR: 1.2 (ref 0.8–1.2)
Prothrombin Time: 14.7 seconds (ref 11.4–15.2)

## 2018-07-29 LAB — BASIC METABOLIC PANEL
Anion gap: 11 (ref 5–15)
BUN: 6 mg/dL — ABNORMAL LOW (ref 8–23)
CO2: 27 mmol/L (ref 22–32)
Calcium: 8 mg/dL — ABNORMAL LOW (ref 8.9–10.3)
Chloride: 97 mmol/L — ABNORMAL LOW (ref 98–111)
Creatinine, Ser: 0.58 mg/dL (ref 0.44–1.00)
GFR calc Af Amer: >60 mL/min (ref >60)
GFR calc non Af Amer: >60 mL/min (ref >60)
Glucose, Bld: 82 mg/dL (ref 70–99)
Potassium: 3.4 mmol/L — ABNORMAL LOW (ref 3.5–5.1)
Sodium: 135 mmol/L (ref 135–145)

## 2018-07-29 LAB — ECHOCARDIOGRAM COMPLETE
Height: 65 in
Weight: 2395.08 oz

## 2018-07-29 LAB — CBC
HCT: 35.4 % — ABNORMAL LOW (ref 36.0–46.0)
Hemoglobin: 11.3 g/dL — ABNORMAL LOW (ref 12.0–15.0)
MCH: 31.6 pg (ref 26.0–34.0)
MCHC: 31.9 g/dL (ref 30.0–36.0)
MCV: 98.9 fL (ref 80.0–100.0)
Platelets: 348 10*3/uL (ref 150–400)
RBC: 3.58 MIL/uL — ABNORMAL LOW (ref 3.87–5.11)
RDW: 13.2 % (ref 11.5–15.5)
WBC: 7.3 10*3/uL (ref 4.0–10.5)
nRBC: 0 % (ref 0.0–0.2)

## 2018-07-29 LAB — GLUCOSE, CAPILLARY: Glucose-Capillary: 88 mg/dL (ref 70–99)

## 2018-07-29 LAB — APTT: aPTT: 35 seconds (ref 24–36)

## 2018-07-29 MED ORDER — POTASSIUM & SODIUM PHOSPHATES 280-160-250 MG PO PACK
1.0000 | PACK | Freq: Three times a day (TID) | ORAL | Status: AC
  Administered 2018-07-29: 1 via ORAL
  Filled 2018-07-29: qty 1

## 2018-07-29 MED ORDER — POTASSIUM CHLORIDE CRYS ER 20 MEQ PO TBCR
40.0000 meq | EXTENDED_RELEASE_TABLET | Freq: Once | ORAL | Status: AC
  Administered 2018-07-29: 40 meq via ORAL
  Filled 2018-07-29: qty 2

## 2018-07-29 MED ORDER — CIPROFLOXACIN HCL 500 MG PO TABS: 500.0000 mg | ORAL_TABLET | Freq: Two times a day (BID) | ORAL | 0 refills | Status: AC

## 2018-07-29 NOTE — TOC Initial Note (Signed)
Transition of Care [facility]) - Initial/Assessment Note    Patient Details  Name: Amy [name] MRN: [id number] Date of Birth: 27-Dec-1935  [facility] of [facility]) CM/SW Contact:    Dessa Phi, RN Phone Number: 07/29/2018, 10:59 AM  Clinical Narrative:  Home alone,has pcp,pharmacy,support,own transportation @ d/c. Will await PT eval;noted HHPT.                  Home health results Skip to 'Modify Your Results' navigation Home health agencies that serve [phone].  Choose up to 3 home health agencies to compare.  So far you have none selected.  Compare now Viewing 1 - 10 of 10 results FirstPreviousNextLast Results list table         [place] InformationSorted ascending, Select to sort descending  Quality of Patient Care RatingSelect to sort ascending or descending Quality of Patient Care Rating Contextual Help  Patient Survey Summary RatingSelect to sort ascending or descending QPatient Survey Summary Rating Contextual Help  [facility] [phone]  [place] my Favorites Quality of Patient Care Rating 4 out of 5 stars Patient Survey Summary Rating 4 out of [place] [phone]  [place] my Favorites Quality of Patient Care Rating 3  out of 5 stars Patient Survey Summary Rating 4 out of [place] [phone]) [phone]  Add [facility] my Favorites Quality of Patient Care Rating 4  out of 5 stars Patient Survey Summary Rating 3 out of 5 stars [place] [phone]  [place] my Favorites Quality of Patient Care Rating 4 out of 5 stars Patient Survey Summary Rating 4 out of 5 stars [place] [phone]  [place] my Favorites Quality of Patient Care Rating 4 out of 5 stars Patient Survey Summary Rating 4 out of 5 stars [facility] [place] [phone]  Add [facility] [place] my Favorites Quality of Patient Care Rating 3  out of 5 stars Patient Survey Summary Rating 4 out of 5 stars [place] [phone]  [place] my Favorites Quality of Patient Care Rating 3 out of 5 stars Patient Survey Summary Rating 4 out of [place] [phone]  Add [facility] [place] my Favorites Quality of Patient Care Rating 3 out of 5 stars Patient Survey Summary Rating 4 out of 5 stars [place] [phone]  Add [facility] Quality of Patient Care Rating 3  out of 5 stars Patient Survey Summary Rating 3 out of [place] [phone]   Expected Discharge Plan: [place] Barriers to Discharge: Continued Medical Work up   Patient Goals and CMS Choice Patient states their goals for this hospitalization and ongoing recovery are:: go home CMS Medicare.gov Compare Post Acute Care list provided to:: Patient Choice offered to / list presented to : Patient  Expected Discharge Plan and Services Expected Discharge Plan: Will   Discharge Planning Services: CM Consult   Living arrangements for the past 2 months: [place]  Prior Living Arrangements/Services Living arrangements for the past 2 months: Single Family Home Lives with:: Self Patient language and need for interpreter reviewed:: Yes        Need for Family Participation in Patient Care: No (Comment) Care giver support system in place?: Yes (comment)   Criminal Activity/Legal Involvement Pertinent to Current Situation/Hospitalization: No - Comment as needed  Activities of Daily Living Home Assistive Devices/Equipment: Dentures (specify type), Hearing aid ADL Screening (condition at time of  admission) Patient's cognitive ability adequate to safely complete daily activities?: Yes Is the patient deaf or have difficulty hearing?: Yes Does the patient have difficulty seeing, even when wearing glasses/contacts?: No Does the patient have difficulty concentrating, remembering, or making decisions?: No Patient able to express need for assistance with ADLs?: Yes Does the patient have difficulty dressing or bathing?: No Independently performs ADLs?: Yes (appropriate for developmental age) Does the patient have difficulty walking or climbing stairs?: Yes Weakness of Legs: Both Weakness of Arms/Hands: Both  Permission Sought/Granted Permission sought to share information with : Case Manager Permission granted to share information with : Yes, Verbal Permission Granted     Permission granted to share info w AGENCY: all [place] agencies        Emotional Assessment Appearance:: Appears stated age Attitude/Demeanor/Rapport: Gracious Affect (typically observed): Accepting Orientation: : Oriented to Self, Oriented to Place, Oriented to  Time, Oriented to Situation Alcohol / Substance Use: Not Applicable Psych Involvement: No (comment)  Admission diagnosis:  Acute cystitis with hematuria [N30.01] Patient Active Problem List   Diagnosis Date Noted  . UTI (urinary tract infection) 07/28/2018  . Weakness 07/28/2018  . Polyneuropathy 03/20/2018  . Gait disturbance 03/20/2018  . Atrial fibrillation ([place]) 03/24/2016  . Dehydration 01/25/2016  . Normocytic anemia 12/12/2012  . GERD (gastroesophageal reflux disease)   . Hypothyroidism   . Arthritis   . Osteoarthritis   . Osteoporosis   . COPD (chronic obstructive pulmonary disease) ([place])   . Esophageal stricture   . Pulmonary nodules   . Anxiety   . Hyperlipidemia   . Multiple pulmonary nodules 04/24/2012  . COPD GOLD III 04/23/2012   PCP:  Aletha Halim., PA-C Pharmacy:   [id number] - [place], [place] - [address]  [address] [place] [address] Phone: [phone] Fax: [phone]     Social Determinants of Health (SDOH) Interventions    Readmission Risk Interventions No flowsheet data found.

## 2018-07-29 NOTE — Plan of Care (Signed)

## 2018-07-29 NOTE — Plan of Care (Signed)
  Problem: Education: Goal: Knowledge of General Education information will improve Description: Including pain rating scale, medication(s)/side effects and non-pharmacologic comfort measures 07/29/2018 1419 by Timoteo Gaul, RN Outcome: Completed/Met 07/29/2018 1105 by Timoteo Gaul, RN Outcome: Progressing   Problem: Health Behavior/Discharge Planning: Goal: Ability to manage health-related needs will improve 07/29/2018 1419 by Timoteo Gaul, RN Outcome: Completed/Met 07/29/2018 1105 by Timoteo Gaul, RN Outcome: Progressing   Problem: Clinical Measurements: Goal: Ability to maintain clinical measurements within normal limits will improve 07/29/2018 1419 by Timoteo Gaul, RN Outcome: Completed/Met 07/29/2018 1105 by Timoteo Gaul, RN Outcome: Progressing Goal: Will remain free from infection 07/29/2018 1419 by Timoteo Gaul, RN Outcome: Completed/Met 07/29/2018 1105 by Timoteo Gaul, RN Outcome: Progressing Goal: Diagnostic test results will improve 07/29/2018 1419 by Timoteo Gaul, RN Outcome: Completed/Met 07/29/2018 1105 by Timoteo Gaul, RN Outcome: Progressing Goal: Respiratory complications will improve 07/29/2018 1419 by Timoteo Gaul, RN Outcome: Completed/Met 07/29/2018 1105 by Timoteo Gaul, RN Outcome: Progressing Goal: Cardiovascular complication will be avoided 07/29/2018 1419 by Timoteo Gaul, RN Outcome: Completed/Met 07/29/2018 1105 by Timoteo Gaul, RN Outcome: Progressing   Problem: Activity: Goal: Risk for activity intolerance will decrease 07/29/2018 1419 by Timoteo Gaul, RN Outcome: Completed/Met 07/29/2018 1105 by Timoteo Gaul, RN Outcome: Progressing   Problem: Nutrition: Goal: Adequate nutrition will be maintained 07/29/2018 1419 by Timoteo Gaul, RN Outcome: Completed/Met 07/29/2018 1105 by Timoteo Gaul, RN Outcome: Progressing   Problem: Coping: Goal: Level of anxiety will decrease 07/29/2018 1419 by Timoteo Gaul, RN Outcome: Completed/Met 07/29/2018 1105 by Timoteo Gaul, RN Outcome: Progressing   Problem: Elimination: Goal: Will not experience complications related to bowel motility 07/29/2018 1419 by Timoteo Gaul, RN Outcome: Completed/Met 07/29/2018 1105 by Timoteo Gaul, RN Outcome: Progressing Goal: Will not experience complications related to urinary retention 07/29/2018 1419 by Timoteo Gaul, RN Outcome: Completed/Met 07/29/2018 1105 by Timoteo Gaul, RN Outcome: Progressing   Problem: Pain Managment: Goal: General experience of comfort will improve 07/29/2018 1419 by Timoteo Gaul, RN Outcome: Completed/Met 07/29/2018 1105 by Timoteo Gaul, RN Outcome: Progressing   Problem: Safety: Goal: Ability to remain free from injury will improve 07/29/2018 1419 by Timoteo Gaul, RN Outcome: Completed/Met 07/29/2018 1105 by Timoteo Gaul, RN Outcome: Progressing   Problem: Skin Integrity: Goal: Risk for impaired skin integrity will decrease 07/29/2018 1419 by Timoteo Gaul, RN Outcome: Completed/Met 07/29/2018 1105 by Timoteo Gaul, RN Outcome: Progressing

## 2018-07-29 NOTE — Discharge Summary (Signed)
Physician Discharge Summary  Amy [name] FHL:[id number] DOB: 1935/05/09 DOA: 07/28/2018  PCP: Aletha Halim., PA-C  Admit date: 07/28/2018 Discharge date: 07/29/2018  Admitted From: Home  Discharge disposition: Home with home health.   Recommendations for Outpatient Follow-Up:   Follow up with your primary care provider in one week.    Discharge Diagnosis:   Principal Problem:   UTI (urinary tract infection) Active Problems:   COPD GOLD III   GERD (gastroesophageal reflux disease)   Hypothyroidism   Arthritis   Osteoarthritis   Osteoporosis   COPD (chronic obstructive pulmonary disease) (HCC)   Anxiety   Hyperlipidemia   Normocytic anemia   Dehydration   Atrial fibrillation ([place])   Weakness    Discharge Condition: Improved.  Diet recommendation: Low sodium, heart healthy.    Wound care: None.  Code status: Full.   History of Present Illness:   Amy [name] is a 83 y.o. female with medical history significant of multiple comorbidities including: Anxiety, P- A. Fib (not anticoagulated), OA, RA, COPD, not O2 dependent, GERD,HLD/ HTN, neuropathy, history of esophageal stricture, h/o SBO to the hospital with progressive weakness poor oral intake and urinary symptoms.  Patient currently lives by herself at home and is independent at baseline.   The ED, vitals were stable.  UA was positive for leukocyte esterase.  Culture with 19 test was negative.  Chest x-ray was within normal limits.  Patient appeared to have mild volume depletion with, UTI, generalized weaknesses so was placed in observation.   Hospital Course:   Following conditions were addressed during hospitalization,  Urinary tract infection likely cystitis.  Sensitive to IV Rocephin.  Will be discharged home on oral antibiotic to complete the course.  Acute on chronic progressive weakness -Patient apparently lives alone, no history of recent falls, no traumatic injuries Was seen by physical therapy  and Occupational Therapy who recommended home physical therapy occupational therapy on discharge.    Volume depletion from poor oral intake.  Patient received IV fluids with improvement in her hydration status.   She was encouraged to drink fluids after discharge  History of COPD.  Patient is compensated. Patient to continue outpatient inhalers on discharge.    GERD (gastroesophageal reflux disease) -Continue PPI    Hypothyroidism -Continue home dose Synthroid  History of : arthritis / Osteoarthritis / Osteoporosis Resume home medication.  Depression/anxiety -Continue home medication of Remeron, PRN Xanax.      Normocytic anemia -Remained stable.  No bleeding.  Hyperlipidemia  -Continue statins  Essential hypertension Continue home medications.   Paroxysmal atrial fibrillation. Continue aspirin and statins beta-blockers.  Was in sinus rhythm in the hospital.  2D echocardiogram from 03/14/2016 with ejection fraction of 60 to 65%.  Not on anticoagulation. -Chads 2 Vasc = at least 4, HAS-BLED = 2.  2D echocardiogram from 07/29/2018 showed ejection fraction more than 65%.   Disposition:  At this time, patient has been seen by physical therapy, occupational therapy.  PT OT recommends home health PT and OT on discharge.  Patient was advised to follow-up with her primary care physician after discharge.  I was unable to reach the patient's daughter on the home and cell phone number provided.   Medical Consultants:    None.   Subjective:   Today, patient feels okay.  Denies any nausea vomiting fever or chills.  Discharge Exam:   Vitals:   07/29/18 0431 07/29/18 1317  BP: (!) 153/66 (!) 156/64  Pulse: 78 69  Resp:  18 18  Temp: 98 F (36.7 C) 98 F (36.7 C)  SpO2: 93% 95%   Vitals:   07/28/18 1305 07/28/18 2022 07/29/18 0431 07/29/18 1317  BP: (!) 154/87 (!) 157/74 (!) 153/66 (!) 156/64  Pulse: 80 79 78 69  Resp: 16 15 18 18   Temp: 98.2 F (36.8 C) 97.6 F  (36.4 C) 98 F (36.7 C) 98 F (36.7 C)  TempSrc: Oral Oral Oral Oral  SpO2: 95% 96% 93% 95%  Weight:   67.9 kg   Height:        General exam: Appears calm and comfortable ,Not in distress HEENT:PERRL,Oral mucosa moist Respiratory system: Bilateral equal air entry, normal vesicular breath sounds, no wheezes or crackles  Cardiovascular system: S1 & S2 heard, RRR.  Gastrointestinal system: Abdomen is nondistended, soft and nontender. No organomegaly or masses felt. Normal bowel sounds heard. Central nervous system: Alert and oriented. No focal neurological deficits. Extremities: No edema, no clubbing ,no cyanosis, distal peripheral pulses palpable. Skin: No rashes, lesions or ulcers,no icterus ,no pallor MSK: Normal muscle bulk,tone ,power    Procedures:    None  The results of significant diagnostics from this hospitalization (including imaging, microbiology, ancillary and laboratory) are listed below for reference.     Diagnostic Studies:   Dg Chest 2 View  Result Date: 07/28/2018 CLINICAL DATA:  Weakness and instability this morning. The patient reports not eating for several days. EXAM: CHEST - 2 VIEW COMPARISON:  Chest dated 05/30/2016. FINDINGS: Mild-to-moderate enlargement of the cardiac silhouette with an interval increase in size. Mildly prominent pulmonary vasculature. The lungs remain hyperexpanded with stable mild prominence of the interstitial markings. No pleural fluid. Unremarkable bones. Upper abdominal surgical clips. IMPRESSION: 1. Interval mild to moderate cardiomegaly and mild pulmonary vascular congestion. 2. Stable changes of COPD. Electronically Signed   By: Claudie Revering M.D.   On: 07/28/2018 10:07     Labs:   Basic Metabolic Panel: Recent Labs  Lab 07/28/18 0814 07/29/18 0526  NA 131* 135  K 4.2 3.4*  CL 92* 97*  CO2 29 27  GLUCOSE 104* 82  BUN 9 6*  CREATININE 0.84 0.58  CALCIUM 8.3* 8.0*  MG 2.0  --   PHOS 2.3*  --    GFR Estimated  Creatinine Clearance: 49.6 mL/min (by C-G formula based on SCr of 0.58 mg/dL). Liver Function Tests: Recent Labs  Lab 07/28/18 0814  AST 34  ALT 19  ALKPHOS 99  BILITOT 0.7  PROT 7.2  ALBUMIN 3.5   No results for input(s): LIPASE, AMYLASE in the last 168 hours. No results for input(s): AMMONIA in the last 168 hours. Coagulation profile Recent Labs  Lab 07/29/18 0526  INR 1.2    CBC: Recent Labs  Lab 07/28/18 0814 07/29/18 0526  WBC 9.2 7.3  NEUTROABS 3.9  --   HGB 11.8* 11.3*  HCT 37.8 35.4*  MCV 100.0 98.9  PLT 352 348   Cardiac Enzymes: No results for input(s): CKTOTAL, CKMB, CKMBINDEX, TROPONINI in the last 168 hours. BNP: Invalid input(s): POCBNP CBG: Recent Labs  Lab 07/29/18 0743  GLUCAP 88   D-Dimer No results for input(s): DDIMER in the last 72 hours. Hgb A1c Recent Labs    07/28/18 0814  HGBA1C 5.9*   Lipid Profile No results for input(s): CHOL, HDL, LDLCALC, TRIG, CHOLHDL, LDLDIRECT in the last 72 hours. Thyroid function studies Recent Labs    07/28/18 1428  TSH 1.058   Anemia work up No results for input(s):  VITAMINB12, FOLATE, FERRITIN, TIBC, IRON, RETICCTPCT in the last 72 hours. Microbiology Recent Results (from the past 240 hour(s))  SARS Coronavirus 2 (CEPHEID- Performed in [facility] hospital lab), Hosp Order     Status: None   Collection Time: 07/28/18  8:15 AM   Specimen: Nasopharyngeal Swab  Result Value Ref Range Status   SARS Coronavirus 2 NEGATIVE NEGATIVE Final    Comment: (NOTE) If result is NEGATIVE SARS-CoV-2 target nucleic acids are NOT DETECTED. The SARS-CoV-2 RNA is generally detectable in upper and lower  respiratory specimens during the acute phase of infection. The lowest  concentration of SARS-CoV-2 viral copies this assay can detect is 250  copies / mL. A negative result does not preclude SARS-CoV-2 infection  and should not be used as the sole basis for treatment or other  patient management decisions.  A  negative result may occur with  improper specimen collection / handling, submission of specimen other  than nasopharyngeal swab, presence of viral mutation(s) within the  areas targeted by this assay, and inadequate number of viral copies  (<250 copies / mL). A negative result must be combined with clinical  observations, patient history, and epidemiological information. If result is POSITIVE SARS-CoV-2 target nucleic acids are DETECTED. The SARS-CoV-2 RNA is generally detectable in upper and lower  respiratory specimens dur ing the acute phase of infection.  Positive  results are indicative of active infection with SARS-CoV-2.  Clinical  correlation with patient history and other diagnostic information is  necessary to determine patient infection status.  Positive results do  not rule out bacterial infection or co-infection with other viruses. If result is PRESUMPTIVE POSTIVE SARS-CoV-2 nucleic acids MAY BE PRESENT.   A presumptive positive result was obtained on the submitted specimen  and confirmed on repeat testing.  While 2019 novel coronavirus  (SARS-CoV-2) nucleic acids may be present in the submitted sample  additional confirmatory testing may be necessary for epidemiological  and / or clinical management purposes  to differentiate between  SARS-CoV-2 and other Sarbecovirus currently known to infect humans.  If clinically indicated additional testing with an alternate test  methodology [phone]) is advised. The SARS-CoV-2 RNA is generally  detectable in upper and lower respiratory sp ecimens during the acute  phase of infection. The expected result is Negative. Fact Sheet for Patients:  StrictlyIdeas.no Fact Sheet for Healthcare Providers: BankingDealers.co.za This test is not yet approved or cleared by the [place] FDA and has been authorized for detection and/or diagnosis of SARS-CoV-2 by FDA under an Emergency Use  Authorization (EUA).  This EUA will remain in effect (meaning this test can be used) for the duration of the COVID-19 declaration under Section 564(b)(1) of the Act, 21 U.S.C. section 360bbb-3(b)(1), unless the authorization is terminated or revoked sooner. Performed at [facility], [place] [address] [place], [place] [address]      Discharge Instructions:   Discharge Instructions    Call MD for:  extreme fatigue   Complete by: As directed    Call MD for:  persistant nausea and vomiting   Complete by: As directed    Call MD for:  persistant nausea and vomiting   Complete by: As directed    Call MD for:  temperature >100.4   Complete by: As directed    Call MD for:  temperature >100.4   Complete by: As directed    Diet - low sodium heart healthy   Complete by: As directed    Discharge instructions  Complete by: As directed    Please follow-up with primary care provider within 1 week.  Complete the course of antibiotic on discharge.  Increase fluid intake.   Discharge instructions   Complete by: As directed    Please follow-up with your primary care physician within 1 week.  Increase fluid intake.  Complete the course of antibiotic.   Increase activity slowly   Complete by: As directed    Increase activity slowly   Complete by: As directed      Allergies as of 07/29/2018      Reactions   Codeine Other (See Comments)   Reaction:  Hallucinations Other reaction(s): Confusion      Medication List    TAKE these medications   ALPRAZolam 0.25 MG tablet Commonly known as: XANAX Take 0.25 mg by mouth 2 (two) times daily.   aspirin EC 81 MG tablet Take 81 mg by mouth at bedtime.   atorvastatin 20 MG tablet Commonly known as: LIPITOR Take 20 mg by mouth at bedtime.   budesonide-formoterol 160-4.5 MCG/ACT inhaler Commonly known as: SYMBICORT Inhale 2 puffs into the lungs 2 (two) times daily as needed (for shortness of breath).   ciprofloxacin 500 MG  tablet Commonly known as: Cipro Take 1 tablet (500 mg total) by mouth 2 (two) times daily for 3 days.   escitalopram 5 MG tablet Commonly known as: LEXAPRO Take 5 mg by mouth daily.   fluticasone 50 MCG/ACT nasal spray Commonly known as: FLONASE Place 1-2 sprays into both nostrils daily as needed for rhinitis.   gabapentin 100 MG capsule Commonly known as: NEURONTIN TAKE 1 CAP EVERY MORNING, 1 CAP IN THE AFTERNOON, AND 2 CAPS EVERY DAY AT BEDTIME What changed: See the new instructions.   hydroxypropyl methylcellulose / hypromellose 2.5 % ophthalmic solution Commonly known as: ISOPTO TEARS / GONIOVISC Place 1-2 drops into both eyes 3 (three) times daily as needed (to soothe dry eyes.).   levothyroxine 75 MCG tablet Commonly known as: SYNTHROID Take 1 tablet (75 mcg total) by mouth daily before breakfast.   metoprolol tartrate 25 MG tablet Commonly known as: LOPRESSOR Take 0.5 tablets (12.5 mg total) by mouth 2 (two) times daily. What changed: when to take this   mirtazapine 15 MG tablet Commonly known as: REMERON Take 15 mg by mouth at bedtime.   ondansetron 4 MG disintegrating tablet Commonly known as: Zofran ODT Take 1 tablet (4 mg total) by mouth every 8 (eight) hours as needed for nausea or vomiting.   pantoprazole 40 MG tablet Commonly known as: PROTONIX Take 40 mg by mouth 2 (two) times daily.   Potassium 99 MG Tabs Take 99 mg by mouth at bedtime.   traMADol 50 MG tablet Commonly known as: ULTRAM Take 50 mg by mouth 2 (two) times daily as needed for moderate pain.   vitamin B-12 1000 MCG tablet Commonly known as: CYANOCOBALAMIN Take 1,000 mcg by mouth daily.   Vitamin D3 50 MCG (2000 UT) Tabs Take 2,000 Units by mouth at bedtime.      Follow-up Information    [facility]-Home Follow up.   Specialty: Home Health Services Why: [place] physical therapy/HH occupational therapy          Time coordinating discharge: 39  minutes  Signed:  Audley Hinojos  [facility] 07/29/2018, 2:41 PM

## 2018-07-29 NOTE — TOC Transition Note (Signed)
Transition of Care [facility]) - CM/SW Discharge Note   Patient Details  Name: Amy [name] MRN: [id number] Date of Birth: September 04, 1935  [facility] CM/SW Contact:  Dessa Phi, RN Phone Number: 07/29/2018, 2:30 PM   Clinical Narrative:[facility] able to accept-rep [facility] aware. Will need HHPT/OT orders. No further CM needs.       Final next level of care: [place] Barriers to Discharge: Continued Medical Work up   Patient Goals and CMS Choice Patient states their goals for this hospitalization and ongoing recovery are:: go home CMS Medicare.gov Compare Post Acute Care list provided to:: Patient Choice offered to / list presented to : Patient  Discharge Placement                       Discharge Plan and Services   Discharge Planning Services: CM Consult                                 Social Determinants of Health (SDOH) Interventions     Readmission Risk Interventions No flowsheet data found.

## 2018-07-29 NOTE — Progress Notes (Signed)
2D Echocardiogram has been performed.  Amy [name] 07/29/2018, 9:12 AM

## 2018-07-29 NOTE — Care Management Obs Status (Signed)
[place] NOTIFICATION   Patient Details  Name: Amy [name] MRN: [id number] Date of Birth: 01-02-1936   Medicare Observation Status Notification Given:       Dessa Phi, RN 07/29/2018, 10:57 AM

## 2018-07-29 NOTE — Evaluation (Signed)
Physical Therapy One Time Evaluation Patient Details Name: Amy [name] MRN: [id number] DOB: Oct 16, 1935 Today's Date: 07/29/2018   History of Present Illness  83 y o female was admitted for weakness and being unstable walking.  Found to be dehydrated and have a UTI. PMH:  COPD, OA, anxiety  Clinical Impression  Patient evaluated by Physical Therapy with no further acute PT needs identified. All education has been completed and the patient has no further questions. Pt ambulated in hallway without assistive device and no unsteadiness or LOB observed.  Pt appears at mobility baseline and eager for d/c home today. See below for any follow-up Physical Therapy or equipment needs. PT is signing off. Thank you for this referral.        Follow Up Recommendations No PT follow up    Equipment Recommendations  None recommended by PT    Recommendations for Other Services       Precautions / Restrictions Precautions Precautions: Fall      Mobility  Bed Mobility Overal bed mobility: Independent                Transfers Overall transfer level: Needs assistance Equipment used: None Transfers: Sit to/from Stand Sit to Stand: Supervision         General transfer comment: for safety  Ambulation/Gait Ambulation/Gait assistance: Min guard;Supervision Gait Distance (Feet): 400 Feet Assistive device: Rolling walker (2 wheeled) Gait Pattern/deviations: Step-through pattern;Decreased stride length     General Gait Details: no unsteadiness or LOB observed, pt with mod SOB requiring standing rest break however reports this is baseline  Financial trader Rankin (Stroke Patients Only)       Balance Overall balance assessment: No apparent balance deficits (not formally assessed)                                           Pertinent Vitals/Pain Pain Assessment: No/denies pain    Home Living Family/patient expects to be  discharged to:: Private residence Living Arrangements: Alone Available Help at Discharge: Family;Friend(s) Type of Home: House Home Access: Stairs to enter   Technical brewer of Steps: 2 Home Layout: One level Home Equipment: None Additional Comments: daughter lives nearby; pt has been doing her own shopping; still drives    Prior Function Level of Independence: Independent               Hand Dominance        Extremity/Trunk Assessment   Upper Extremity Assessment Upper Extremity Assessment: Overall WFL for tasks assessed    Lower Extremity Assessment Lower Extremity Assessment: Overall WFL for tasks assessed    Cervical / Trunk Assessment Cervical / Trunk Assessment: Normal  Communication   Communication: No difficulties  Cognition Arousal/Alertness: Awake/alert Behavior During Therapy: WFL for tasks assessed/performed Overall Cognitive Status: Within Functional Limits for tasks assessed                                        General Comments General comments (skin integrity, edema, etc.): pt denies any recent falls    Exercises     Assessment/Plan    PT Assessment Patent does not need any further PT services  PT Problem List  PT Treatment Interventions      PT Goals (Current goals can be found in the Care Plan section)  Acute Rehab PT Goals PT Goal Formulation: All assessment and education complete, DC therapy    Frequency     Barriers to discharge        Co-evaluation               AM-PAC PT "6 Clicks" Mobility  Outcome Measure Help needed turning from your back to your side while in a flat bed without using bedrails?: None Help needed moving from lying on your back to sitting on the side of a flat bed without using bedrails?: None Help needed moving to and from a bed to a chair (including a wheelchair)?: None Help needed standing up from a chair using your arms (e.g., wheelchair or bedside chair)?: A  Little Help needed to walk in hospital room?: A Little Help needed climbing 3-5 steps with a railing? : A Little 6 Click Score: 21    End of Session Equipment Utilized During Treatment: Gait belt Activity Tolerance: Patient tolerated treatment well Patient left: in chair;with call bell/phone within reach Nurse Communication: Mobility status PT Visit Diagnosis: Difficulty in walking, not elsewhere classified (R26.2)    Time: 5366-4403 PT Time Calculation (min) (ACUTE ONLY): 12 min   Charges:   PT Evaluation $PT Eval Low Complexity: [place], PT, DPT Acute Rehabilitation Services Office: [phone] Pager: [phone]   Trena Platt 07/29/2018, 3:02 PM

## 2018-07-30 ENCOUNTER — Encounter: Payer: Medicare Other | Admitting: Neurology

## 2018-07-30 LAB — URINE CULTURE: Culture: NO GROWTH

## 2018-08-28 ENCOUNTER — Ambulatory Visit: Payer: Medicare Other

## 2018-10-01 ENCOUNTER — Encounter (INDEPENDENT_AMBULATORY_CARE_PROVIDER_SITE_OTHER): Payer: Medicare Other | Admitting: Neurology

## 2018-10-01 ENCOUNTER — Ambulatory Visit (INDEPENDENT_AMBULATORY_CARE_PROVIDER_SITE_OTHER): Payer: Medicare Other | Admitting: Neurology

## 2018-10-01 ENCOUNTER — Other Ambulatory Visit: Payer: Self-pay

## 2018-10-01 DIAGNOSIS — G629 Polyneuropathy, unspecified: Secondary | ICD-10-CM

## 2018-10-01 DIAGNOSIS — R200 Anesthesia of skin: Secondary | ICD-10-CM | POA: Diagnosis not present

## 2018-10-01 DIAGNOSIS — Z0289 Encounter for other administrative examinations: Secondary | ICD-10-CM

## 2018-10-01 DIAGNOSIS — R202 Paresthesia of skin: Secondary | ICD-10-CM

## 2018-10-01 DIAGNOSIS — R531 Weakness: Secondary | ICD-10-CM | POA: Diagnosis not present

## 2018-10-01 NOTE — Progress Notes (Signed)
Full Name: Shephanie Spaugh Gender: Female MRN #: [id number] Date of Birth: Jul 26, 1935    Visit Date: 10/01/2018 09:04 Age: 83 Years 20 Months Old Examining Physician: Arlice Colt, MD  Referring Physician: Arlice Colt, MD    History: Mrs. Barton is an 83 year old woman with numbness in both feet and difficulties with balance.  Nerve conduction studies: In the right arm, median and ulnar motor and sensory responses were normal. In the right leg, tibial and peroneal motor responses were normal.  The sural sensory response was normal.  The superficial peroneal sensory response had a mildly reduced amplitude but normal latency.  This is normal for her age. F-wave responses were normal. Galvanic skin response in the left foot was normal.  Electromyography: Needle EMG of selected muscles of the left leg was performed.  She had mild chronic denervation in the tibialis anterior muscle and a couple other muscles showed some polyphasic motor units with normal recruitment.  There was no spontaneous activity in any of the muscles tested.  Impression: This NCV/EMG study shows the following: 1.   There is no evidence of a significant large or small fiber polyneuropathy 2.   Probable mild L4 or L5 radiculopathy without active features  Efrata Brunner A. Felecia Shelling, MD, PhD, FAAN Certified in Neurology, Clinical Neurophysiology, Sleep Medicine, Pain Medicine and Neuroimaging Director, [facility] [address], [place], [place] [address] [phone]        [facility]    Nerve / Sites Muscle Latency Ref. Amplitude Ref. Rel Amp Segments Distance Velocity Ref. Area    ms ms mV mV %  cm m/s m/s mVms  R Median - APB     Wrist APB 3.6 ?4.4 8.2 ?4.0 100 Wrist - APB 7   23.0     Upper arm APB 7.7  8.0  97.2 Upper arm - Wrist 21 51 ?49 22.2  R Ulnar - ADM     Wrist ADM 2.7 ?3.3 7.8 ?6.0 100 Wrist - ADM 7   29.2   B.Elbow ADM 6.2  7.0  90.1 B.Elbow - Wrist 20 56 ?49 27.6     A.Elbow ADM 8.1  6.8  97.9 A.Elbow - B.Elbow 10 53 ?49 27.2         A.Elbow - Wrist      R Peroneal - EDB     Ankle EDB 4.7 ?6.5 9.2 ?2.0 100 Ankle - EDB 9   25.8     Fib head EDB 11.0  6.7  72.8 Fib head - Ankle 28 44 ?44 23.0     Pop fossa EDB 13.2  6.2  93.2 Pop fossa - Fib head 10 45 ?44 22.5         Pop fossa - Ankle      R Tibial - AH     Ankle AH 3.8 ?5.8 4.9 ?4.0 100 Ankle - AH 9   15.4     Pop fossa AH 14.1  4.1  84.6 Pop fossa - Ankle 42 41 ?41 12.1             SSR      SNC    Nerve / Sites Rec. Site Peak Lat Ref.  Amp Ref. Segments Distance    ms ms V V  cm  R Sural - Ankle (Calf)     Calf Ankle 4.2 ?4.4 6 ?6 Calf - Ankle 14  R Superficial peroneal -  Ankle     Lat leg Ankle 4.4 ?4.4 4 ?6 Lat leg - Ankle 14  R Median - Orthodromic (Dig II, Mid palm)     Dig II Wrist 3.4 ?3.4 12 ?10 Dig II - Wrist 13  R Ulnar - Orthodromic, (Dig V, Mid palm)     Dig V Wrist 3.0 ?3.1 6 ?5 Dig V - Wrist 26              F  Wave    Nerve F Lat Ref.   ms ms  R Tibial - AH 55.7 ?56.0  R Ulnar - ADM 27.3 ?32.0         EMG full       EMG Summary Table    Spontaneous MUAP Recruitment  Muscle IA Fib PSW Fasc Other Amp Dur. Poly Pattern  R. Vastus medialis Normal None None None _______ Normal Normal 1+ Normal  R. Tibialis anterior Normal None None None _______ Normal Increased 1+ Reduced  R. Peroneus longus Normal None None None _______ Normal Normal Normal Normal  R. Gastrocnemius (Medial head) Normal None None None _______ Normal Normal Normal Normal  R. Abductor hallucis Normal None None None _______ Normal Normal Normal Normal  R. Gluteus medius Normal None None None _______ Normal Normal 1+ Normal  R. Iliopsoas Normal None None None _______ Normal Normal Normal Normal

## 2018-10-09 ENCOUNTER — Ambulatory Visit: Payer: Medicare Other

## 2018-10-12 IMAGING — CR DG ABDOMEN ACUTE W/ 1V CHEST
4 series · 4 of 4 positions shown · non-contrast
Comparison: 10/14/2015.

CLINICAL DATA: Nausea vomiting.  Abdominal discomfort.

EXAM:
DG ABDOMEN ACUTE W/ 1V CHEST

[w chest pa]
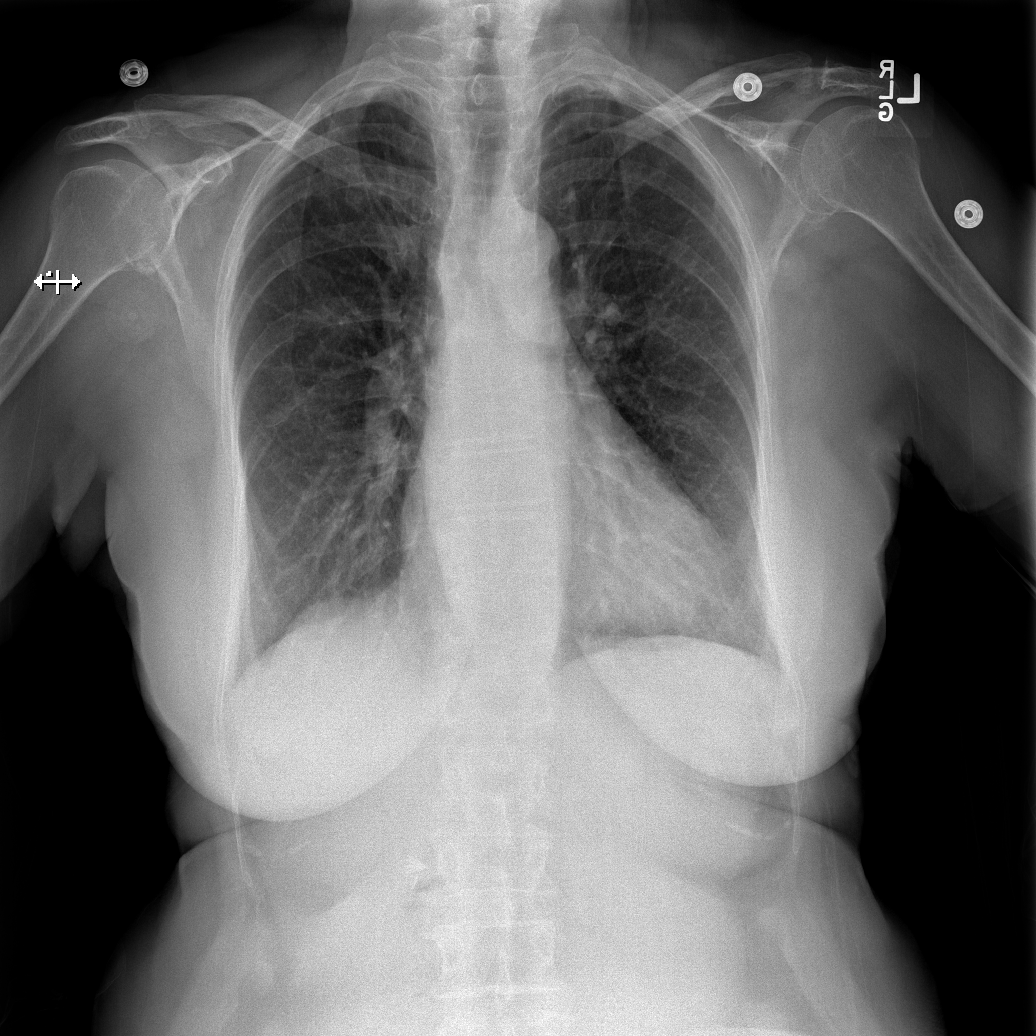

[w abdomen upright]
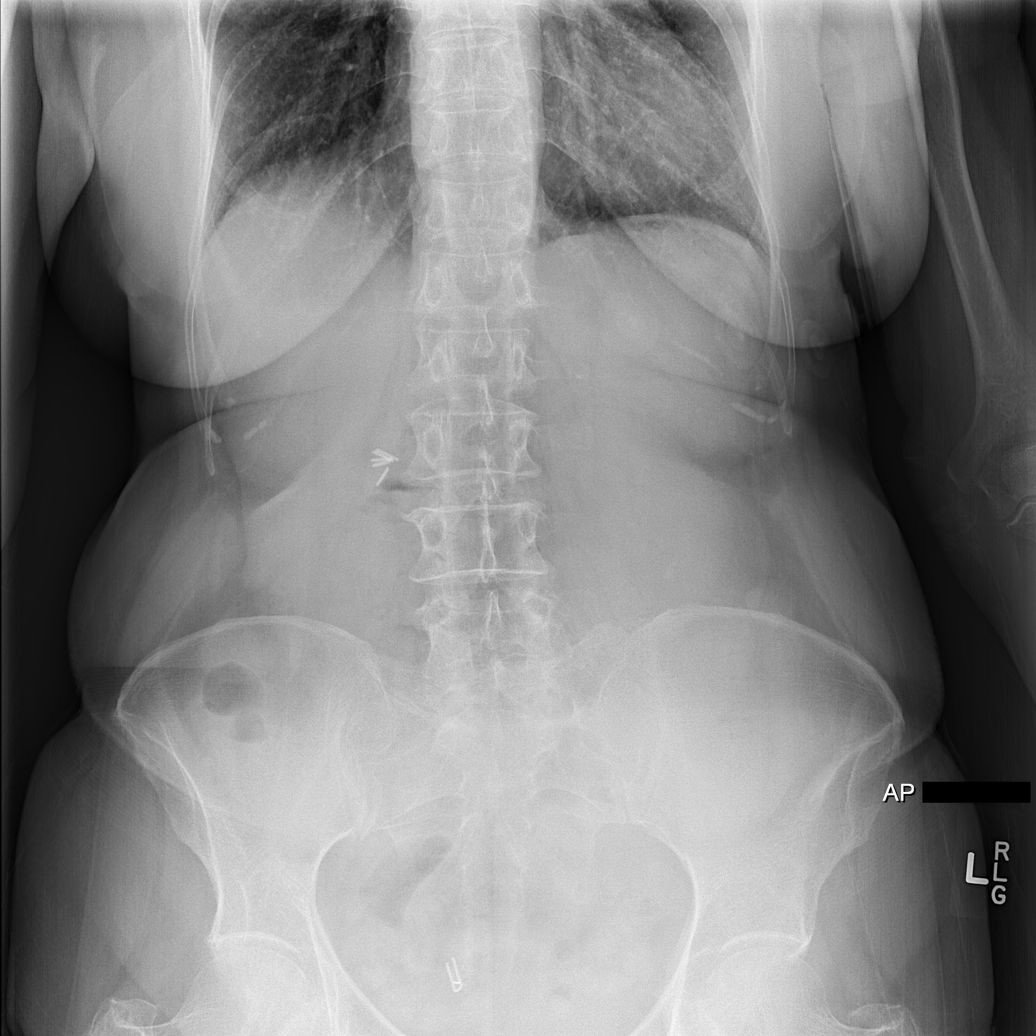

[t abdomen supine (1 of 2)]
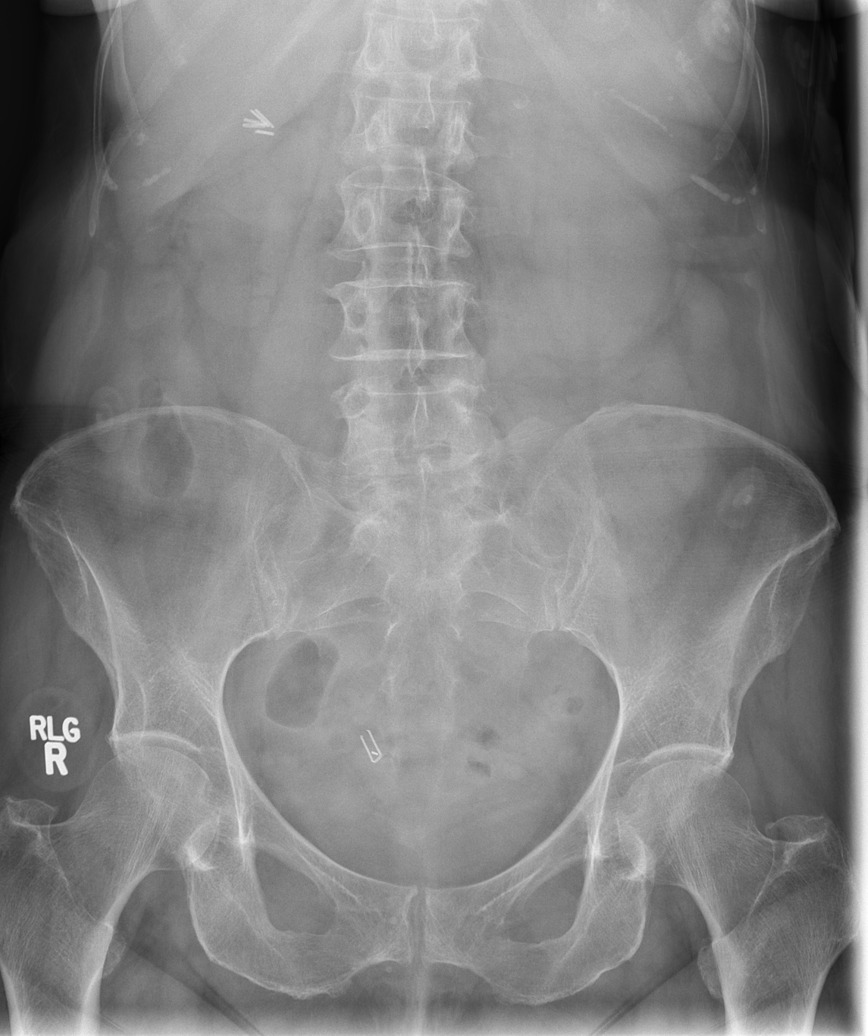

[t abdomen supine (2 of 2)]
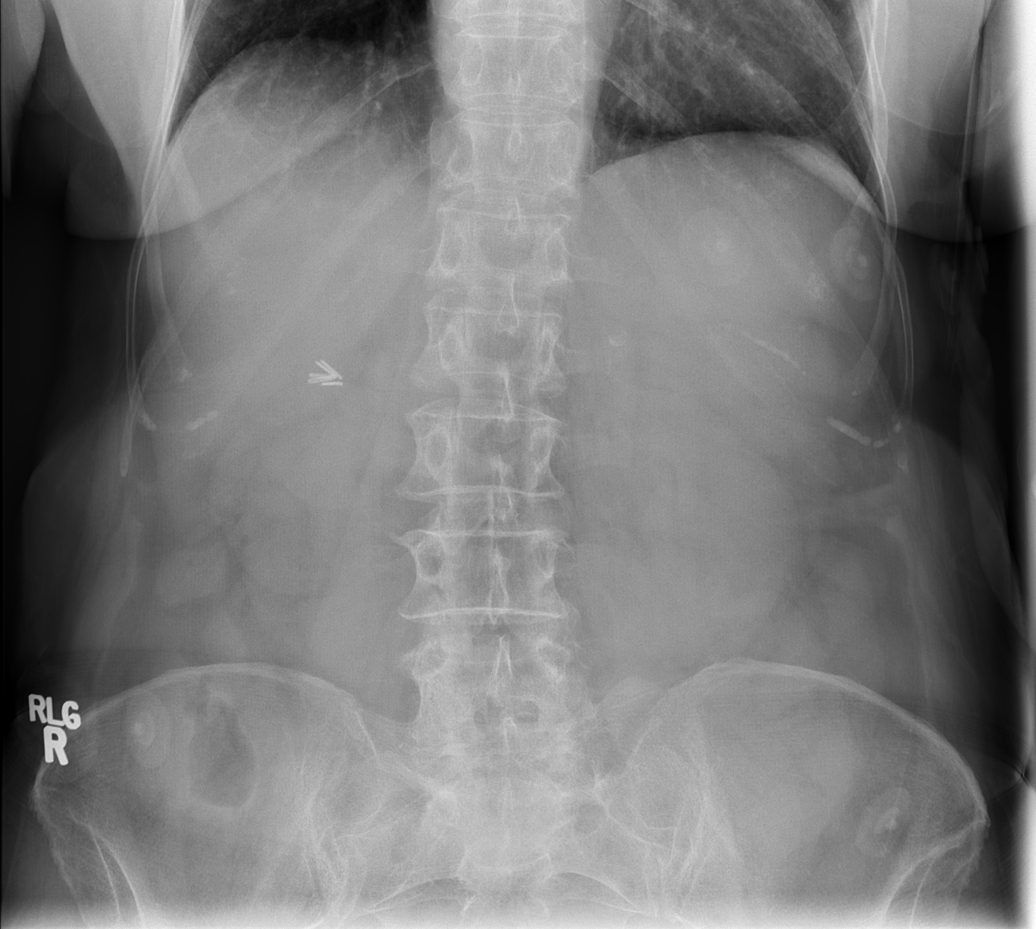

[4 of 4 positions shown; findings below may reference images not displayed]

FINDINGS: Mediastinum hilar structures are normal. Lungs are clear. No pleural
effusion or pneumothorax. Heart size stable. No acute bony
abnormality. Surgical clips right upper quadrant and pelvis. No
bowel distention . Moderate stool volume. No free air. Degenerative
changes thoracic spine and lumbar spine.
IMPRESSION: 1. No acute cardiopulmonary disease.

2. No acute intra-abdominal abnormality. No bowel distention.
Moderate stool volume.

## 2019-01-08 ENCOUNTER — Ambulatory Visit: Payer: Medicare Other

## 2019-01-11 IMAGING — CT CT ABD-PELV W/ CM
2 of 5 series · 15 of 46 positions shown, 17 images · IV contrast (ISOVUE)
Comparison: CT of the abdomen and pelvis performed 02/16/2016

CLINICAL DATA: Acute onset of left lower quadrant abdominal
cramping. Initial encounter.

EXAM:
CT ABDOMEN AND PELVIS WITH CONTRAST
TECHNIQUE: Multidetector CT imaging of the abdomen and pelvis was performed
using the standard protocol following bolus administration of
intravenous contrast.
CONTRAST:  100 mL of Isovue 300 IV contrast

[Series 2: abd/pel with · axial · 0.70mm/px · z∈[-281,+74]mm · 12 of 81 slices shown, 14 images]
[im 5/81  soft-tissue]
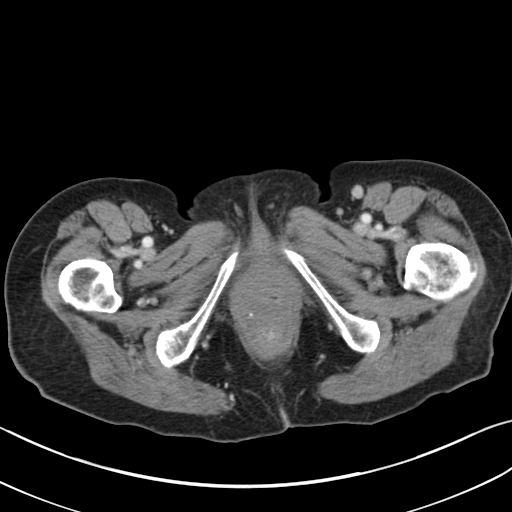
[im 5/81  bone]
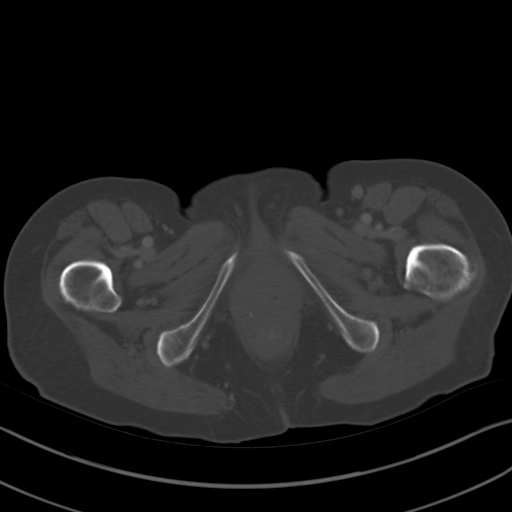
[im 15/81  soft-tissue]
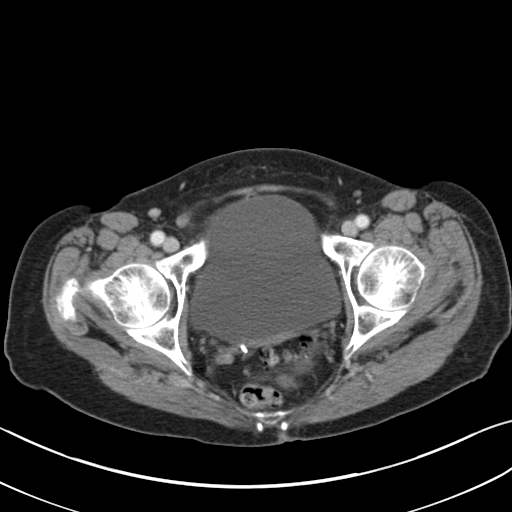
[im 19/81  soft-tissue]
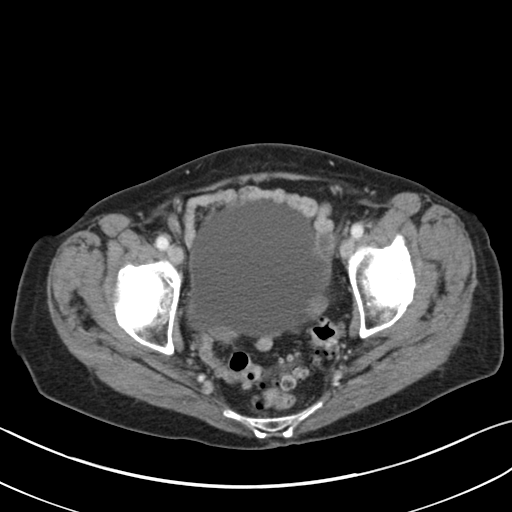
[im 24/81  soft-tissue]
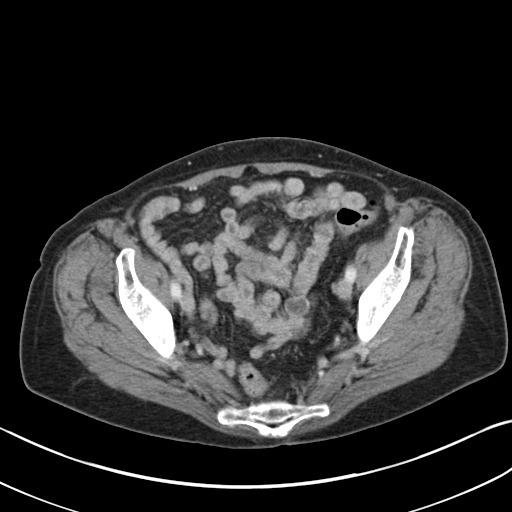
[im 33/81  soft-tissue]
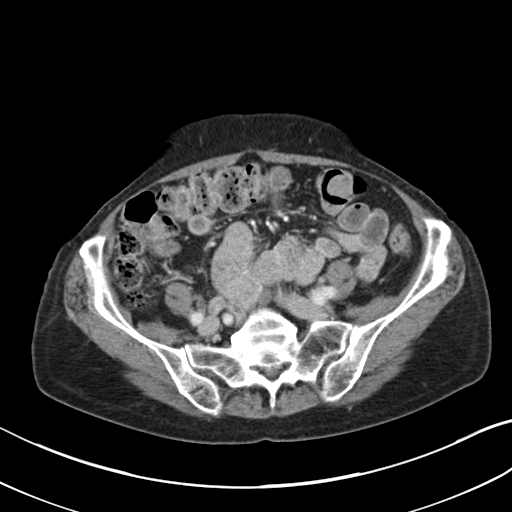
[im 38/81  soft-tissue]
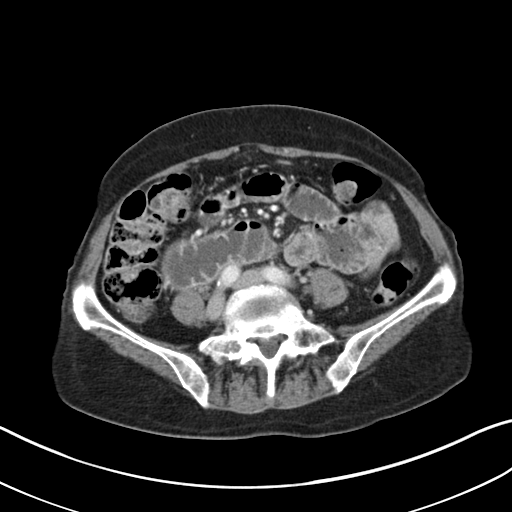
[im 43/81  soft-tissue]
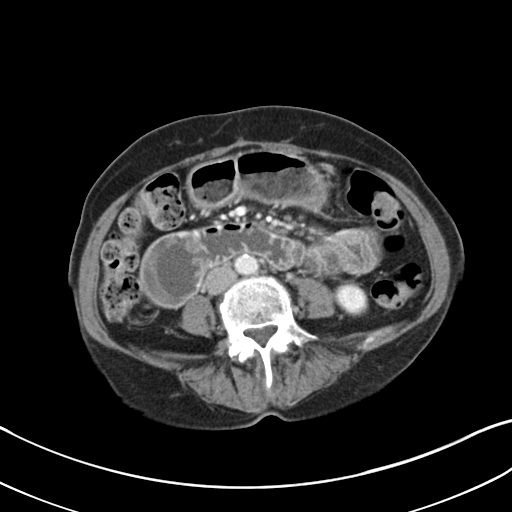
[im 52/81  soft-tissue]
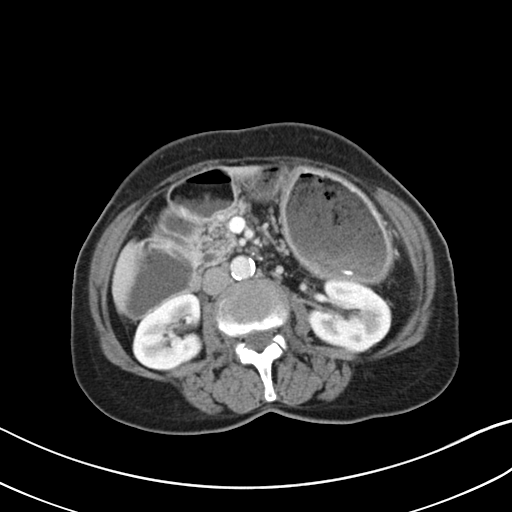
[im 57/81  soft-tissue]
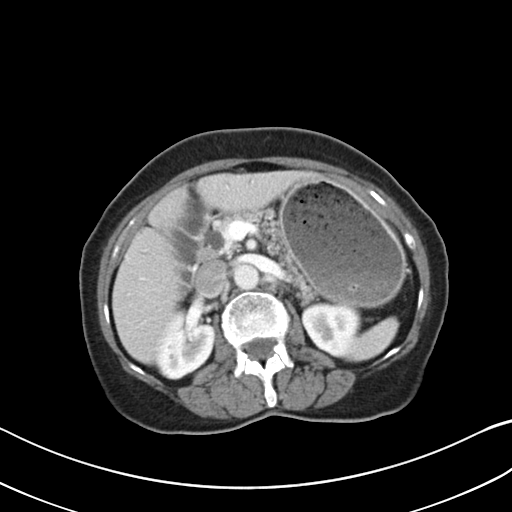
[im 57/81  bone]
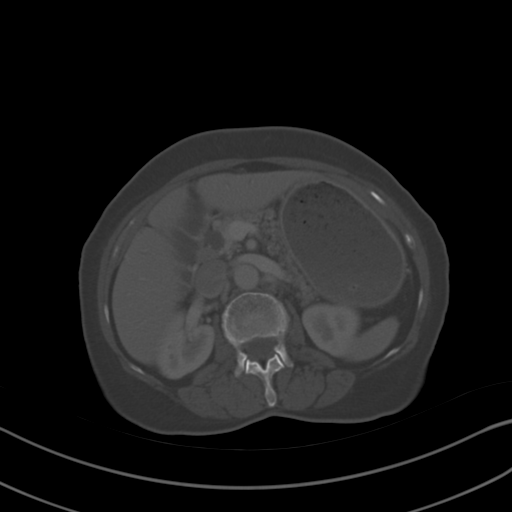
[im 62/81  soft-tissue]
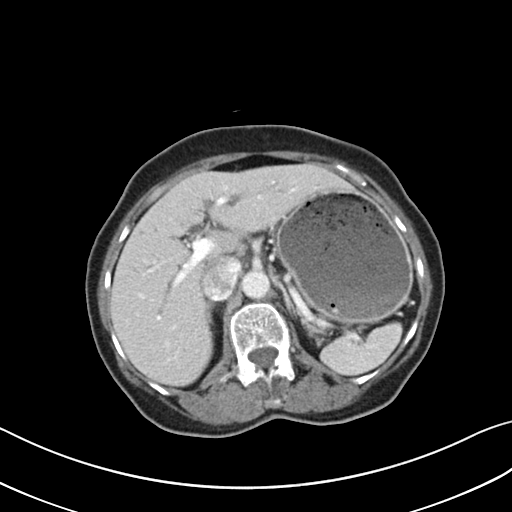
[im 71/81  soft-tissue]
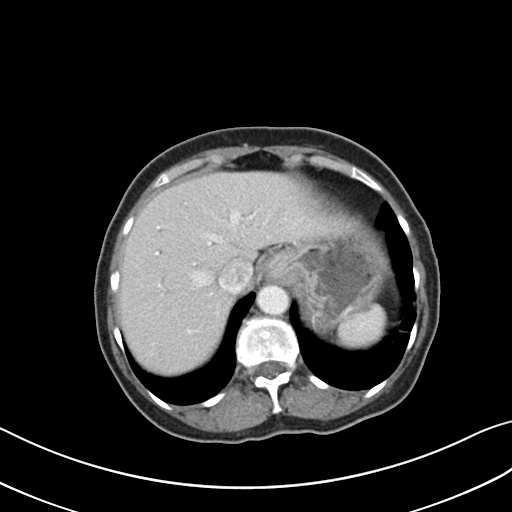
[im 76/81  soft-tissue]
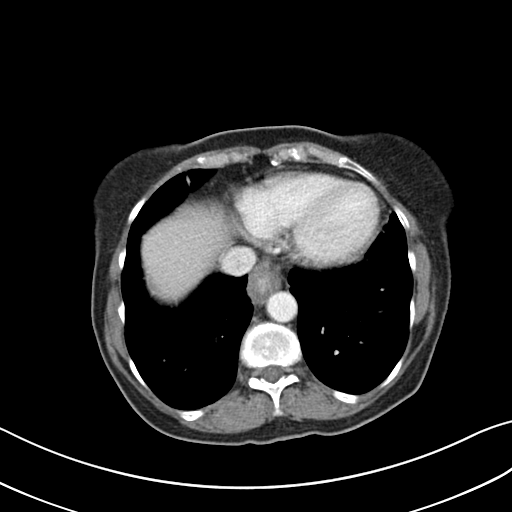

[Series 5: coronal a/|p · coronal · 0.65mm/px · 3 of 120 slices shown]
[im 40/120  soft-tissue]
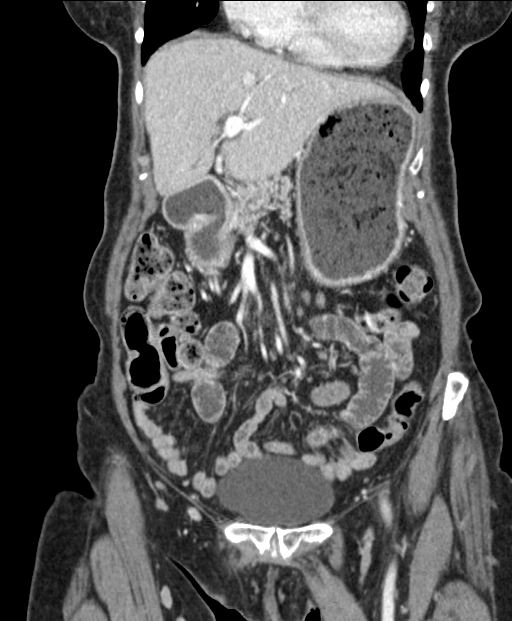
[im 53/120  soft-tissue]
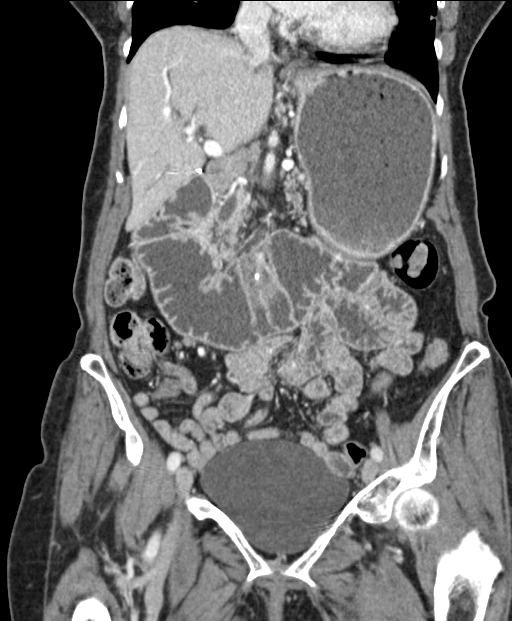
[im 67/120  soft-tissue]
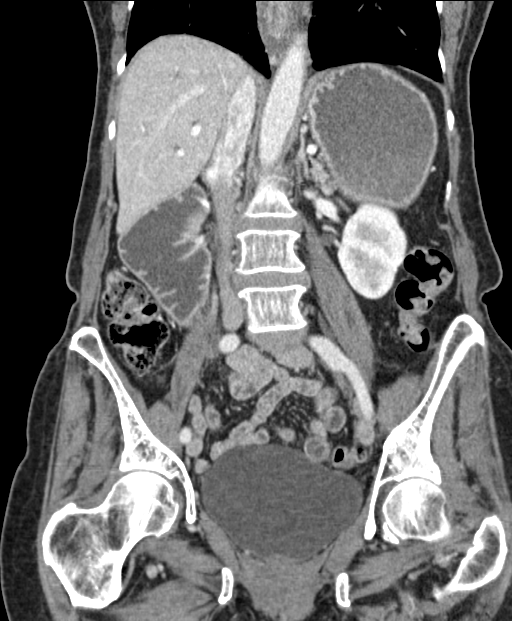

[15 of 46 positions shown; findings below may reference images not displayed]

FINDINGS: Lower chest: Diffuse wall thickening is noted along the distal
esophagus, likely reflecting esophagitis. Mild patchy peripheral
opacities are noted at the lung bases bilaterally, which may reflect
an atypical infectious process.

Hepatobiliary: The liver is grossly unremarkable. The patient is
status post cholecystectomy, with clips noted along the gallbladder
fossa. The common bile duct is grossly unremarkable in appearance
status post cholecystectomy.

Pancreas: The pancreas is within normal limits.

Spleen: The spleen is unremarkable in appearance.

Adrenals/Urinary Tract: The adrenal glands are unremarkable in
appearance. The kidneys are within normal limits. There is no
evidence of hydronephrosis. No renal or ureteral stones are
identified. No perinephric stranding is seen.

Stomach/Bowel: The stomach is diffusely distended with fluid, solid
material and air. There is diffuse distention of the duodenum and
proximal jejunum, with suggestion of mucosal thickening along the
jejunum, concerning for acute jejunitis, possibly infectious or
inflammatory in nature.

The more distal small bowel is decompressed and grossly
unremarkable. The appendix is not visualized; there is no evidence
for appendicitis. Scattered diverticulosis is noted along the
sigmoid colon, without evidence of diverticulitis.

Vascular/Lymphatic: Scattered calcification is seen along the
abdominal aorta and its branches. The abdominal aorta is otherwise
grossly unremarkable. The inferior vena cava is grossly
unremarkable. No retroperitoneal lymphadenopathy is seen. No pelvic
sidewall lymphadenopathy is identified.

Reproductive: The bladder is significantly distended and grossly
unremarkable. The patient is status post hysterectomy. No suspicious
adnexal masses are seen.

Other: No additional soft tissue abnormalities are seen.

Musculoskeletal: No acute osseous abnormalities are identified. The
visualized musculature is unremarkable in appearance.
IMPRESSION: 1. Diffuse wall thickening along the distal esophagus, likely
reflecting esophagitis.
2. Diffuse distention of the duodenum and proximal jejunum, with
suggestion of mucosal thickening along the jejunum, concerning for
acute jejunitis, possibly infectious or inflammatory in nature.
3. Associated diffuse distention of the stomach with fluid, solid
material and air.
4. Mild patchy peripheral opacities at the lung bases bilaterally,
which may reflect an atypical infectious process.
5. Scattered diverticulosis along the sigmoid colon, without
evidence of diverticulitis.
6. Scattered aortic atherosclerosis.

## 2019-02-03 ENCOUNTER — Telehealth: Payer: Self-pay | Admitting: Neurology

## 2019-02-03 NOTE — Telephone Encounter (Signed)
Pt called stating that her feet are really swollen and she would like to know if she can wear her compression hoses with her having neuropathy. Please advise.

## 2019-02-03 NOTE — Telephone Encounter (Signed)
Called pt back. Advised she is able to use compression hoses as long as she can tolerate it. She verbalized understanding.

## 2019-02-11 ENCOUNTER — Other Ambulatory Visit: Payer: Self-pay

## 2019-02-11 ENCOUNTER — Emergency Department (HOSPITAL_COMMUNITY)
Admission: EM | Admit: 2019-02-11 | Discharge: 2019-02-11 | Disposition: A | Discharge status: Home / Self Care | Payer: Medicare Other | Attending: Emergency Medicine | Admitting: Emergency Medicine

## 2019-02-11 DIAGNOSIS — Z87891 Personal history of nicotine dependence: Secondary | ICD-10-CM | POA: Insufficient documentation

## 2019-02-11 DIAGNOSIS — R350 Frequency of micturition: Secondary | ICD-10-CM

## 2019-02-11 DIAGNOSIS — J449 Chronic obstructive pulmonary disease, unspecified: Secondary | ICD-10-CM | POA: Insufficient documentation

## 2019-02-11 DIAGNOSIS — E039 Hypothyroidism, unspecified: Secondary | ICD-10-CM | POA: Insufficient documentation

## 2019-02-11 DIAGNOSIS — E860 Dehydration: Secondary | ICD-10-CM | POA: Diagnosis not present

## 2019-02-11 LAB — COMPREHENSIVE METABOLIC PANEL
ALT: 14 U/L (ref 0–44)
AST: 26 U/L (ref 15–41)
Albumin: 3.9 g/dL (ref 3.5–5.0)
Alkaline Phosphatase: 81 U/L (ref 38–126)
Anion gap: 8 (ref 5–15)
BUN: 6 mg/dL — ABNORMAL LOW (ref 8–23)
CO2: 26 mmol/L (ref 22–32)
Calcium: 9 mg/dL (ref 8.9–10.3)
Chloride: 100 mmol/L (ref 98–111)
Creatinine, Ser: 0.68 mg/dL (ref 0.44–1.00)
GFR calc Af Amer: >60 mL/min (ref >60)
GFR calc non Af Amer: >60 mL/min (ref >60)
Glucose, Bld: 98 mg/dL (ref 70–99)
Potassium: 3.5 mmol/L (ref 3.5–5.1)
Sodium: 134 mmol/L — ABNORMAL LOW (ref 135–145)
Total Bilirubin: 1.2 mg/dL (ref 0.3–1.2)
Total Protein: 7.8 g/dL (ref 6.5–8.1)

## 2019-02-11 LAB — URINALYSIS, ROUTINE W REFLEX MICROSCOPIC
Bilirubin Urine: NEGATIVE
Glucose, UA: NEGATIVE mg/dL
Hgb urine dipstick: NEGATIVE
Ketones, ur: NEGATIVE mg/dL
Nitrite: NEGATIVE
Protein, ur: NEGATIVE mg/dL
Specific Gravity, Urine: 1.008 (ref 1.005–1.030)
pH: 7 (ref 5.0–8.0)

## 2019-02-11 LAB — CBC WITH DIFFERENTIAL/PLATELET
Abs Immature Granulocytes: 0.02 10*3/uL (ref 0.00–0.07)
Basophils Absolute: 0 10*3/uL (ref 0.0–0.1)
Basophils Relative: 0 %
Eosinophils Absolute: 0.1 10*3/uL (ref 0.0–0.5)
Eosinophils Relative: 2 %
HCT: 37.4 % (ref 36.0–46.0)
Hemoglobin: 12.2 g/dL (ref 12.0–15.0)
Immature Granulocytes: 0 %
Lymphocytes Relative: 51 %
Lymphs Abs: 3.5 10*3/uL (ref 0.7–4.0)
MCH: 31.6 pg (ref 26.0–34.0)
MCHC: 32.6 g/dL (ref 30.0–36.0)
MCV: 96.9 fL (ref 80.0–100.0)
Monocytes Absolute: 0.7 10*3/uL (ref 0.1–1.0)
Monocytes Relative: 10 %
Neutro Abs: 2.6 10*3/uL (ref 1.7–7.7)
Neutrophils Relative %: 37 %
Platelets: 301 10*3/uL (ref 150–400)
RBC: 3.86 MIL/uL — ABNORMAL LOW (ref 3.87–5.11)
RDW: 14.6 % (ref 11.5–15.5)
WBC: 6.9 10*3/uL (ref 4.0–10.5)
nRBC: 0 % (ref 0.0–0.2)

## 2019-02-11 MED ORDER — SODIUM CHLORIDE 0.9 % IV BOLUS
500.0000 mL | Freq: Once | INTRAVENOUS | Status: AC
  Administered 2019-02-11: 500 mL via INTRAVENOUS

## 2019-02-11 NOTE — ED Triage Notes (Signed)
Pt brought in by EMS from home. EMS states that pt states that she thinks she is dehydrated and has a UTI. EMS states that pt denies odor or pain. Pt reports that she is urinating a lot. Pt is AO x4 and is ambulatory.

## 2019-02-11 NOTE — Discharge Instructions (Signed)
You were seen in the emergency department today with dehydration symptoms and urine frequency. We did not find evidence of urine infection. We did give IV fluids. Please increase your water intake. Consider using meal supplement shakes available at the pharmacy to help with poor appetite issues. Please call your primary care doctor's office today to schedule the next available appointment. Return to the emergency department with any new or suddenly worsening symptoms. Please call to reschedule your imaging appointment scheduled for this morning.

## 2019-02-11 NOTE — ED Provider Notes (Signed)
Emergency Department Provider Note   I have reviewed the triage vital signs and the nursing notes.   HISTORY  Chief Complaint Possible UTI   HPI Amy [name] is a 84 y.o. female with PMH of COPD, A-fib, HLD, GERD, and prior history of UTI who presents to the emergency department for evaluation of urine frequency and subjective dehydration symptoms.  Patient tells me that she has had her urine tested frequently with no evidence of infection recently.  No recent antibiotics.  She is having frequency with mild, intermittent dysuria.  Denies any abdominal or flank pain.  No diarrhea or vomiting symptoms.  She denies fever.  She tells me that she feels dehydrated as well despite trying to drink plenty of fluids at home.  She states that she has felt this way before including dry mouth finds it very uncomfortable.  She was tested for COVID-19 recently which came back negative and she is not having URI symptoms.  She was scheduled this morning for a pelvic CT scan but is not sure exactly why. She called to re-schedule and planned to come to the ED for IVF and evaluate for possible UTI.    Past Medical History:  Diagnosis Date  . Anxiety   . Arthritis   . Atrial fibrillation ([place])   . COPD (chronic obstructive pulmonary disease) ([place])   . Esophageal stricture    s/p repeated dilations, Dr. Watt Climes  . GERD (gastroesophageal reflux disease)   . Hearing loss bilateral   has hearing aids  . Heart murmur   . Hiatal hernia   . History of blood transfusion    many years ago   . Hyperlipidemia   . Hypothyroidism   . Osteoarthritis   . Osteoporosis   . Pulmonary nodules   . Renal lesion    1cm left kidney    Patient Active Problem List   Diagnosis Date Noted  . UTI (urinary tract infection) 07/28/2018  . Weakness 07/28/2018  . Polyneuropathy 03/20/2018  . Gait disturbance 03/20/2018  . Atrial fibrillation ([place]) 03/24/2016  . Dehydration 01/25/2016  . Normocytic anemia 12/12/2012  .  GERD (gastroesophageal reflux disease)   . Hypothyroidism   . Arthritis   . Osteoarthritis   . Osteoporosis   . COPD (chronic obstructive pulmonary disease) ([place])   . Esophageal stricture   . Pulmonary nodules   . Anxiety   . Hyperlipidemia   . Multiple pulmonary nodules 04/24/2012  . COPD GOLD III 04/23/2012    Past Surgical History:  Procedure Laterality Date  . ABDOMINAL HYSTERECTOMY    . BREAST ENHANCEMENT SURGERY  2006  . CATARACT EXTRACTION W/ INTRAOCULAR LENS  IMPLANT, BILATERAL  '09  . ESOPHAGEAL DILATION    . KNEE ARTHROSCOPY  02/22/2011   Procedure: ARTHROSCOPY KNEE;  Surgeon: Gearlean Alf, MD;  Location: [facility];  Service: Orthopedics;  Laterality: Right;  WITH DEBRIDEMENT   . LAPAROSCOPIC SMALL BOWEL RESECTION N/A 06/02/2016   Procedure: DIAGNOSTIC LAPAROSCOPY SMALL BOWEL RESECTION;  Surgeon: Armandina Gemma, MD;  Location: [facility];  Service: General;  Laterality: N/A;    Allergies Codeine  Family History  Problem Relation Age of Onset  . Heart disease Father   . Rheum arthritis Father   . Heart failure Mother   . Diabetes Brother     Social History Social History   Tobacco Use  . Smoking status: Former Smoker    Packs/day: 1.00    Years: 30.00    Pack years:  30.00    Types: Cigarettes    Quit date: 01/09/1997    Years since quitting: 22.1  . Smokeless tobacco: Never Used  Substance Use Topics  . Alcohol use: No  . Drug use: No    Review of Systems  Constitutional: No fever/chills Eyes: No visual changes. ENT: No sore throat. Dry mouth.  Cardiovascular: Denies chest pain. Respiratory: Denies shortness of breath. Gastrointestinal: No abdominal pain.  No nausea, no vomiting.  No diarrhea.  Genitourinary: Intermittent dysuria with urine frequency.  Musculoskeletal: Negative for back pain. Skin: Negative for rash. Neurological: Negative for headaches, focal weakness or numbness.  10-point ROS otherwise  negative.  ____________________________________________   PHYSICAL EXAM:  VITAL SIGNS: ED Triage Vitals  Enc Vitals Group     BP 02/11/19 0722 (!) 131/56     Pulse Rate 02/11/19 0722 66     Resp 02/11/19 0722 16     Temp 02/11/19 0722 98.2 F (36.8 C)     Temp Source 02/11/19 0722 Oral     SpO2 02/11/19 0718 96 %     Weight 02/11/19 0726 137 lb (62.1 kg)     Height 02/11/19 0726 5\' 5"  (1.651 m)   Constitutional: Alert and oriented. Well appearing and in no acute distress. Eyes: Conjunctivae are normal. Head: Atraumatic. Nose: No congestion/rhinnorhea. Mouth/Throat: Mucous membranes are moist.  Neck: No stridor.   Cardiovascular: Normal rate, regular rhythm. Good peripheral circulation. Grossly normal heart sounds.   Respiratory: Normal respiratory effort.  No retractions. Lungs CTAB. Gastrointestinal: Soft and nontender. No distention.  Musculoskeletal: No gross deformities of extremities. Neurologic:  Normal speech and language.  Skin:  Skin is warm, dry and intact. No rash noted.   ____________________________________________   LABS (all labs ordered are listed, but only abnormal results are displayed)  Labs Reviewed  COMPREHENSIVE METABOLIC PANEL - Abnormal; Notable for the following components:      Result Value   Sodium 134 (*)    BUN 6 (*)    All other components within normal limits  CBC WITH DIFFERENTIAL/PLATELET - Abnormal; Notable for the following components:   RBC 3.86 (*)    All other components within normal limits  URINALYSIS, ROUTINE W REFLEX MICROSCOPIC - Abnormal; Notable for the following components:   Leukocytes,Ua TRACE (*)    Bacteria, UA RARE (*)    All other components within normal limits  URINE CULTURE   ____________________________________________   PROCEDURES  Procedure(s) performed:   Procedures  None  ____________________________________________   INITIAL IMPRESSION / ASSESSMENT AND PLAN / ED COURSE  Pertinent labs &  imaging results that were available during my care of the patient were reviewed by me and considered in my medical decision making (see chart for details).   Patient presents to the emergency department with subjective dehydration type symptoms along with intermittent dysuria and urine frequency.  Plan for UA along with screening blood work and IV fluids.  Abdomen is diffusely soft and nontender.  I do not see an indication for emergency CT abdomen pelvis at this time.  Patient to reschedule her outpatient imaging.   No evidence of infection on urinalysis.  Lab work reviewed with no acute findings.  Patient is feeling improved after IV fluids.  She plans to reschedule her abdominal imaging appointment and follow closely with her PCP in the coming week.  Discussed ED return precautions.  ____________________________________________  FINAL CLINICAL IMPRESSION(S) / ED DIAGNOSES  Final diagnoses:  Dehydration  Urine frequency    MEDICATIONS  GIVEN DURING THIS VISIT:  Medications  sodium chloride 0.9 % bolus 500 mL (0 mLs Intravenous Stopped 02/11/19 1055)    Note:  This document was prepared using Dragon voice recognition software and may include unintentional dictation errors.  Nanda Quinton, MD, [facility] Emergency Medicine    Jaelah Hauth, [facility], MD 02/12/19 [phone]

## 2019-02-12 LAB — URINE CULTURE
Culture: NO GROWTH
Special Requests: NORMAL

## 2019-04-10 DIAGNOSIS — C801 Malignant (primary) neoplasm, unspecified: Secondary | ICD-10-CM

## 2019-04-10 HISTORY — DX: Malignant (primary) neoplasm, unspecified: C80.1

## 2019-08-08 ENCOUNTER — Other Ambulatory Visit: Payer: Self-pay | Admitting: Family Medicine

## 2019-08-08 ENCOUNTER — Other Ambulatory Visit: Payer: Self-pay

## 2019-08-08 ENCOUNTER — Ambulatory Visit
Admission: RE | Admit: 2019-08-08 | Discharge: 2019-08-08 | Disposition: A | Discharge status: Home / Self Care | Payer: Medicare Other | Source: Ambulatory Visit | Attending: Family Medicine | Admitting: Family Medicine

## 2019-08-08 DIAGNOSIS — Z1231 Encounter for screening mammogram for malignant neoplasm of breast: Secondary | ICD-10-CM

## 2019-09-22 ENCOUNTER — Other Ambulatory Visit: Payer: Self-pay | Admitting: Family Medicine

## 2019-09-22 DIAGNOSIS — J358 Other chronic diseases of tonsils and adenoids: Secondary | ICD-10-CM

## 2019-09-26 ENCOUNTER — Ambulatory Visit
Admission: RE | Admit: 2019-09-26 | Discharge: 2019-09-26 | Disposition: A | Discharge status: Home / Self Care | Payer: Medicare Other | Source: Ambulatory Visit | Attending: Family Medicine | Admitting: Family Medicine

## 2019-09-26 DIAGNOSIS — J358 Other chronic diseases of tonsils and adenoids: Secondary | ICD-10-CM

## 2019-09-26 MED ORDER — IOPAMIDOL (ISOVUE-300) INJECTION 61%
75.0000 mL | Freq: Once | INTRAVENOUS | Status: AC | PRN
  Administered 2019-09-26: 75 mL via INTRAVENOUS

## 2019-10-08 ENCOUNTER — Other Ambulatory Visit: Payer: Self-pay

## 2019-10-08 ENCOUNTER — Inpatient Hospital Stay: Payer: Medicare Other | Admitting: Hematology and Oncology

## 2019-10-08 ENCOUNTER — Telehealth: Payer: Self-pay | Admitting: Hematology and Oncology

## 2019-10-08 ENCOUNTER — Encounter: Payer: Self-pay | Admitting: *Deleted

## 2019-10-08 ENCOUNTER — Inpatient Hospital Stay: Payer: Medicare Other | Attending: Hematology and Oncology

## 2019-10-08 VITALS — BP 150/77 | HR 79 | Temp 96.3°F | Resp 18 | Ht 65.0 in | Wt 131.7 lb

## 2019-10-08 DIAGNOSIS — E039 Hypothyroidism, unspecified: Secondary | ICD-10-CM

## 2019-10-08 DIAGNOSIS — C8331 Diffuse large B-cell lymphoma, lymph nodes of head, face, and neck: Secondary | ICD-10-CM | POA: Diagnosis present

## 2019-10-08 DIAGNOSIS — Z90710 Acquired absence of both cervix and uterus: Secondary | ICD-10-CM

## 2019-10-08 DIAGNOSIS — Z8249 Family history of ischemic heart disease and other diseases of the circulatory system: Secondary | ICD-10-CM

## 2019-10-08 DIAGNOSIS — M810 Age-related osteoporosis without current pathological fracture: Secondary | ICD-10-CM | POA: Diagnosis not present

## 2019-10-08 DIAGNOSIS — I480 Paroxysmal atrial fibrillation: Secondary | ICD-10-CM | POA: Diagnosis not present

## 2019-10-08 DIAGNOSIS — K219 Gastro-esophageal reflux disease without esophagitis: Secondary | ICD-10-CM

## 2019-10-08 DIAGNOSIS — Z833 Family history of diabetes mellitus: Secondary | ICD-10-CM

## 2019-10-08 DIAGNOSIS — J449 Chronic obstructive pulmonary disease, unspecified: Secondary | ICD-10-CM | POA: Insufficient documentation

## 2019-10-08 DIAGNOSIS — I4891 Unspecified atrial fibrillation: Secondary | ICD-10-CM | POA: Insufficient documentation

## 2019-10-08 DIAGNOSIS — Z79899 Other long term (current) drug therapy: Secondary | ICD-10-CM | POA: Insufficient documentation

## 2019-10-08 DIAGNOSIS — Z7982 Long term (current) use of aspirin: Secondary | ICD-10-CM | POA: Diagnosis not present

## 2019-10-08 DIAGNOSIS — Z87891 Personal history of nicotine dependence: Secondary | ICD-10-CM | POA: Insufficient documentation

## 2019-10-08 LAB — CMP (CANCER CENTER ONLY)
ALT: 20 U/L (ref 0–44)
AST: 26 U/L (ref 15–41)
Albumin: 3.8 g/dL (ref 3.5–5.0)
Alkaline Phosphatase: 130 U/L — ABNORMAL HIGH (ref 38–126)
Anion gap: 9 (ref 5–15)
BUN: 7 mg/dL — ABNORMAL LOW (ref 8–23)
CO2: 31 mmol/L (ref 22–32)
Calcium: 9.8 mg/dL (ref 8.9–10.3)
Chloride: 98 mmol/L (ref 98–111)
Creatinine: 0.78 mg/dL (ref 0.44–1.00)
GFR, Est AFR Am: >60 mL/min (ref >60)
GFR, Estimated: >60 mL/min (ref >60)
Glucose, Bld: 114 mg/dL — ABNORMAL HIGH (ref 70–99)
Potassium: 3.7 mmol/L (ref 3.5–5.1)
Sodium: 138 mmol/L (ref 135–145)
Total Bilirubin: 0.7 mg/dL (ref 0.3–1.2)
Total Protein: 8.7 g/dL — ABNORMAL HIGH (ref 6.5–8.1)

## 2019-10-08 LAB — CBC WITH DIFFERENTIAL (CANCER CENTER ONLY)
Abs Immature Granulocytes: 0.01 10*3/uL (ref 0.00–0.07)
Basophils Absolute: 0 10*3/uL (ref 0.0–0.1)
Basophils Relative: 0 %
Eosinophils Absolute: 0.2 10*3/uL (ref 0.0–0.5)
Eosinophils Relative: 2 %
HCT: 43.2 % (ref 36.0–46.0)
Hemoglobin: 14 g/dL (ref 12.0–15.0)
Immature Granulocytes: 0 %
Lymphocytes Relative: 72 %
Lymphs Abs: 8.2 10*3/uL — ABNORMAL HIGH (ref 0.7–4.0)
MCH: 30.7 pg (ref 26.0–34.0)
MCHC: 32.4 g/dL (ref 30.0–36.0)
MCV: 94.7 fL (ref 80.0–100.0)
Monocytes Absolute: 0.8 10*3/uL (ref 0.1–1.0)
Monocytes Relative: 7 %
Neutro Abs: 2.2 10*3/uL (ref 1.7–7.7)
Neutrophils Relative %: 19 %
Platelet Count: 301 10*3/uL (ref 150–400)
RBC: 4.56 MIL/uL (ref 3.87–5.11)
RDW: 13.7 % (ref 11.5–15.5)
WBC Count: 11.5 10*3/uL — ABNORMAL HIGH (ref 4.0–10.5)
nRBC: 0 % (ref 0.0–0.2)

## 2019-10-08 LAB — URIC ACID: Uric Acid, Serum: 4.2 mg/dL (ref 2.5–7.1)

## 2019-10-08 LAB — RETIC PANEL
Immature Retic Fract: 9.7 % (ref 2.3–15.9)
RBC.: 4.42 MIL/uL (ref 3.87–5.11)
Retic Count, Absolute: 68.1 10*3/uL (ref 19.0–186.0)
Retic Ct Pct: 1.5 % (ref 0.4–3.1)
Reticulocyte Hemoglobin: 37 pg (ref >27.9)

## 2019-10-08 LAB — HEPATITIS B CORE ANTIBODY, TOTAL: Hep B Core Total Ab: NONREACTIVE

## 2019-10-08 LAB — HEPATITIS B SURFACE ANTIGEN: Hepatitis B Surface Ag: NONREACTIVE

## 2019-10-08 LAB — LACTATE DEHYDROGENASE: LDH: 164 U/L (ref 98–192)

## 2019-10-08 LAB — HEPATITIS B SURFACE ANTIBODY,QUALITATIVE: Hep B S Ab: NONREACTIVE

## 2019-10-08 NOTE — Telephone Encounter (Signed)
Received a new pt referral from Dr. Redmond Baseman for [name]. Amy [name] has been cld and scheduled to see Dr. Lorenso Courier today at 2pm. Pt aware to arrive 30 minutes early.

## 2019-10-08 NOTE — Progress Notes (Signed)
Called patient to schedule a new patient appointment with Dr Marin Olp. She wishes to be seen at the [facility]. Referral sent to [facility] for scheduling.  Oncology Nurse Navigator Documentation  Oncology Nurse Navigator Flowsheets 10/08/2019  Navigator Location [facility]  Referral Date to RadOnc/MedOnc 10/07/2019  Navigator Encounter Type Introductory Phone Call  Time Spent with Patient 30

## 2019-10-08 NOTE — Progress Notes (Signed)
[place] [phone]   Fax:([phone]) [place] NOTE  Patient Care Team: Amie Critchley as PCP - General (Family Medicine)  Hematological/Oncological History # Diffuse Large B Cell Lymphoma, Workup Underway 1) 09/26/2019: CT scan of neck performed due to persistent tonsillar lesion after outpatient antibiotic course. CT scan showed a heterogeneous right tonsillar mass up to 3.2 cm and a small but suspicious right level 2A lymph node  2) 09/30/2019: patient underwent biopsy with ENT which revealed a DLBCL with FISH for MYC, BCL2 and BCL6 pending 3) 10/08/2019: establish care with Dr. Lorenso Courier   CHIEF COMPLAINTS/PURPOSE OF CONSULTATION:  "Diffuse Large B Cell Lymphoma "  HISTORY OF PRESENTING ILLNESS:  Amy [name] 84 y.o. female with medical history significant for atrial fibrillation, COPD, GERD, osteoporosis, hypothyroidism, and HLD who presents for evaluation of newly diagnosed DLBCL of the tonsil.   On review of the previous records Amy [name] initially underwent a CT scan of her neck on 09/26/2019 at which time she was found to have a tonsillar mass at the 3.2 cm and a small but suspicious right level 2A lymph node.  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  FISH for MYC, BCL2, BCL6 are currently pending at the time of this note writing.  Due to concern for this new diagnosis of diffuse large B-cell lymphoma she was referred to hematology for further evaluation and management.  On exam today Amy [name] is accompanied by her son-in-law.  She notes that her symptoms began initially when "her throat did not feel right".  She notes that he stuck her finger in the back of her mouth and felt that she had a large lesion that abutted her uvula.  She came to her PCP with these concerns and he prescribed her with amoxicillin for approximately 10 days.  She  notes that these pills did "no good".  And that when this failed to resolve she underwent her CT scan.  On the CT scan showed the mass she underwent biopsy and noted she had some bleeding at that time, but otherwise has had no further episodes of bleeding of this lesion in the back of her mouth.  On further discussion she notes that she has been having some chills and has lost about 6 pounds of weight in the interim since this was discovered.  She notes that she does have some trouble swallowing and she feels like "sometimes a pill is stuck in the back of my mouth".  She has some discomfort, but no overt pain.  She denies having any issues with fevers, sweats, nausea, vomiting or diarrhea.  She has not noticed any lymphadenopathy elsewhere in her body.  She does endorse that she was a smoker but quit approximately 23 years ago.  Her family history is remarkable for a sister with breast cancer, but no other malignancies noted in her family.  A full 10 point ROS is listed below.  MEDICAL HISTORY:  Past Medical History:  Diagnosis Date   Anxiety    Arthritis    Atrial fibrillation (HCC)    COPD (chronic obstructive pulmonary disease) ([place])    Esophageal stricture    s/p repeated dilations, Dr. Watt Climes   GERD (gastroesophageal reflux disease)    Hearing loss bilateral   has hearing aids   Heart murmur    Hiatal hernia    History of  blood transfusion    many years ago    Hyperlipidemia    Hypothyroidism    Osteoarthritis    Osteoporosis    Pulmonary nodules    Renal lesion    1cm left kidney    SURGICAL HISTORY: Past Surgical History:  Procedure Laterality Date   ABDOMINAL HYSTERECTOMY     BREAST ENHANCEMENT SURGERY  2006   CATARACT EXTRACTION W/ INTRAOCULAR LENS  IMPLANT, BILATERAL  '09   ESOPHAGEAL DILATION     KNEE ARTHROSCOPY  02/22/2011   Procedure: ARTHROSCOPY KNEE;  Surgeon: Gearlean Alf, MD;  Location: [facility];  Service: Orthopedics;   Laterality: Right;  WITH DEBRIDEMENT    LAPAROSCOPIC SMALL BOWEL RESECTION N/A 06/02/2016   Procedure: DIAGNOSTIC LAPAROSCOPY SMALL BOWEL RESECTION;  Surgeon: Armandina Gemma, MD;  Location: [facility];  Service: General;  Laterality: N/A;    SOCIAL HISTORY: Social History   Socioeconomic History   Marital status: Widowed    Spouse name: Not on file   Number of children: 3   Years of education: 43   Highest education level: Not on file  Occupational History   Not on file  Tobacco Use   Smoking status: Former Smoker    Packs/day: 1.00    Years: 30.00    Pack years: 30.00    Types: Cigarettes    Quit date: 01/09/1997    Years since quitting: 22.7   Smokeless tobacco: Never Used  Vaping Use   Vaping Use: Never used  Substance and Sexual Activity   Alcohol use: No   Drug use: No   Sexual activity: Not on file  Other Topics Concern   Not on file  Social History Narrative   Right handed    Live alone   Caffeine use: 1 cup coffee every morning   Social Determinants of Health   Financial Resource Strain:    Difficulty of Paying Living Expenses: Not on file  Food Insecurity:    Worried About Charity fundraiser in the Last Year: Not on file   [employer] of Food in the Last Year: Not on file  Transportation Needs:    Lack of Transportation (Medical): Not on file   Lack of Transportation (Non-Medical): Not on file  Physical Activity:    Days of Exercise per Week: Not on file   Minutes of Exercise per Session: Not on file  Stress:    Feeling of Stress : Not on file  Social Connections:    Frequency of Communication with Friends and Family: Not on file   Frequency of Social Gatherings with Friends and Family: Not on file   Attends Religious Services: Not on file   Active Member of Clubs or Organizations: Not on file   Attends Archivist Meetings: Not on file   Marital Status: Not on file  Intimate Partner Violence:    Fear of Current or  Ex-Partner: Not on file   Emotionally Abused: Not on file   Physically Abused: Not on file   Sexually Abused: Not on file    FAMILY HISTORY: Family History  Problem Relation Age of Onset   Heart disease Father    Rheum arthritis Father    Heart failure Mother    Diabetes Brother     ALLERGIES:  is allergic to other and codeine.  MEDICATIONS:  Current Outpatient Medications  Medication Sig Dispense Refill   albuterol (VENTOLIN HFA) 108 (90 Base) MCG/ACT inhaler INHALE 2 PUFFS INTO THE LUNGS EVERY  4 HOURS AS NEEDED FOR WHEEZING OR SHORTNESS OF BREATH     ondansetron (ZOFRAN ODT) 4 MG disintegrating tablet Take 1 tablet (4 mg total) by mouth every 8 (eight) hours as needed for nausea or vomiting. 20 tablet 0   ALPRAZolam (XANAX) 0.25 MG tablet Take 0.25 mg by mouth 2 (two) times daily.      amLODipine (NORVASC) 5 MG tablet Take 5 mg by mouth daily.     aspirin EC 81 MG tablet Take 81 mg by mouth at bedtime.     atorvastatin (LIPITOR) 20 MG tablet Take 20 mg by mouth at bedtime.  11   budesonide-formoterol (SYMBICORT) 160-4.5 MCG/ACT inhaler Inhale 2 puffs into the lungs 2 (two) times daily as needed (for shortness of breath).      Cholecalciferol (VITAMIN D3) 2000 UNITS TABS Take 2,000 Units by mouth at bedtime.      fluticasone (FLONASE) 50 MCG/ACT nasal spray Place 1-2 sprays into both nostrils daily as needed for rhinitis.  (Patient not taking: Reported on 10/08/2019)  11   gabapentin (NEURONTIN) 100 MG capsule TAKE 1 CAP EVERY MORNING, 1 CAP IN THE AFTERNOON, AND 2 CAPS EVERY DAY AT BEDTIME (Patient taking differently: Take 100-200 mg by mouth See admin instructions. Take 100mg  by mouth every morning, take 100mg  by mouth in the afternoon, and take 200mg  by mouth at bedtime) 360 capsule 2   hydroxypropyl methylcellulose / hypromellose (ISOPTO TEARS / GONIOVISC) 2.5 % ophthalmic solution Place 1-2 drops into both eyes 3 (three) times daily as needed (to soothe dry  eyes.). (Patient not taking: Reported on 10/08/2019)     levothyroxine (SYNTHROID, LEVOTHROID) 75 MCG tablet Take 1 tablet (75 mcg total) by mouth daily before breakfast. 30 tablet 1   metoprolol tartrate (LOPRESSOR) 25 MG tablet Take 0.5 tablets (12.5 mg total) by mouth 2 (two) times daily. (Patient taking differently: Take 12.5 mg by mouth daily. ) 60 tablet 1   mirtazapine (REMERON) 15 MG tablet Take 15 mg by mouth at bedtime.      pantoprazole (PROTONIX) 40 MG tablet Take 40 mg by mouth 2 (two) times daily.   11   Potassium 99 MG TABS Take 99 mg by mouth at bedtime.     traMADol (ULTRAM) 50 MG tablet Take 50 mg by mouth 2 (two) times daily as needed for moderate pain.      vitamin B-12 (CYANOCOBALAMIN) 1000 MCG tablet Take 1,000 mcg by mouth daily.     No current facility-administered medications for this visit.    REVIEW OF SYSTEMS:   Constitutional: ( - ) fevers, ( - )  chills , ( - ) night sweats Eyes: ( - ) blurriness of vision, ( - ) double vision, ( - ) watery eyes Ears, nose, mouth, throat, and face: ( - ) mucositis, ( - ) sore throat Respiratory: ( - ) cough, ( - ) dyspnea, ( - ) wheezes Cardiovascular: ( - ) palpitation, ( - ) chest discomfort, ( - ) lower extremity swelling Gastrointestinal:  ( - ) nausea, ( - ) heartburn, ( - ) change in bowel habits Skin: ( - ) abnormal skin rashes Lymphatics: ( - ) new lymphadenopathy, ( - ) easy bruising Neurological: ( - ) numbness, ( - ) tingling, ( - ) new weaknesses Behavioral/Psych: ( - ) mood change, ( - ) new changes  All other systems were reviewed with the patient and are negative.  PHYSICAL EXAMINATION: ECOG PERFORMANCE STATUS: 2 - Symptomatic, <50% confined  to bed  Vitals:   10/08/19 1353  BP: (!) 150/77  Pulse: 79  Resp: 18  Temp: (!) 96.3 F (35.7 C)  SpO2: 94%   Filed Weights   10/08/19 1353  Weight: 131 lb 11.2 oz (59.7 kg)    GENERAL: well appearing elderly Caucasian female in NAD  SKIN: skin color,  texture, turgor are normal, no rashes or significant lesions EYES: conjunctiva are pink and non-injected, sclera clear OROPHARYNX: large tonsillar lesion abutting the uvula. Overlying erythema but no discharge. no exudate, no erythema; lips, buccal mucosa, and tongue normal  NECK: supple, non-tender LYMPH:  no palpable lymphadenopathy in the cervical, axillary or supraclavicular lymph nodes.  LUNGS: clear to auscultation and percussion with normal breathing effort HEART: regular rate & rhythm and no murmurs and no lower extremity edema ABDOMEN: soft, non-tender, non-distended, normal bowel sounds. No HSM appreciated.  Musculoskeletal: no cyanosis of digits and no clubbing  PSYCH: alert & oriented x 3, fluent speech NEURO: no focal motor/sensory deficits  LABORATORY DATA:  I have reviewed the data as listed CBC Latest Ref Rng & Units 10/08/2019 02/11/2019 07/29/2018  WBC 4.0 - 10.5 K/uL 11.5(H) 6.9 7.3  Hemoglobin 12.0 - 15.0 g/dL 14.0 12.2 11.3(L)  Hematocrit 36 - 46 % 43.2 37.4 35.4(L)  Platelets 150 - 400 K/uL 301 301 348    CMP Latest Ref Rng & Units 10/08/2019 02/11/2019 07/29/2018  Glucose 70 - 99 mg/dL 114(H) 98 82  BUN 8 - 23 mg/dL 7(L) 6(L) 6(L)  Creatinine 0.44 - 1.00 mg/dL 0.78 0.68 0.58  Sodium 135 - 145 mmol/L 138 134(L) 135  Potassium 3.5 - 5.1 mmol/L 3.7 3.5 3.4(L)  Chloride 98 - 111 mmol/L 98 100 97(L)  CO2 22 - 32 mmol/L 31 26 27   Calcium 8.9 - 10.3 mg/dL 9.8 9.0 8.0(L)  Total Protein 6.5 - 8.1 g/dL 8.7(H) 7.8 -  Total Bilirubin 0.3 - 1.2 mg/dL 0.7 1.2 -  Alkaline Phos 38 - 126 U/L 130(H) 81 -  AST 15 - 41 U/L 26 26 -  ALT 0 - 44 U/L 20 14 -     PATHOLOGY:     RADIOGRAPHIC STUDIES: I have personally reviewed the radiological images as listed and agreed with the findings in the report: right tonsillar mass, consistent with biopsied lymphoma.  CT SOFT TISSUE NECK W CONTRAST  Result Date: 09/26/2019 CLINICAL DATA:  84 year old female with swelling inside the right  mouth/throat for 1.5 weeks. Creatinine was obtained on site at [place] at [address] Results: Creatinine 0.7 mg/dL. EXAM: CT NECK WITH CONTRAST TECHNIQUE: Multidetector CT imaging of the neck was performed using the standard protocol following the bolus administration of intravenous contrast. CONTRAST:  26mL ISOVUE-300 IOPAMIDOL (ISOVUE-300) INJECTION 61% COMPARISON:  None. FINDINGS: Pharynx and larynx: Larynx appears within normal limits. Hypopharynx, vallecula are within normal limits. At the right oropharynx inseparable from the soft palate there is heterogeneously enhancing round soft tissue mass measuring about 23 x 20 by 32 mm (AP by transverse by CC). See series 3, image 44 and coronal image 41. There is some internal hypodensity within the lesion, although no regional inflammation. The parapharyngeal and retropharyngeal spaces remain normal. The contralateral left palatine tonsil, and the nasopharynx are within normal limits. Salivary glands: Negative sublingual space. Atrophied submandibular glands. Parotid glands are also atrophied, more so the right. Thyroid: Diminutive or absent. Lymph nodes: Small but suspicious, mildly heterogeneous right level 2A lymph node near the level of the oropharyngeal mass  on series 3, image 48 is 8 mm short axis. Other bilateral cervical nodes are symmetric and within normal limits. Vascular: Major vascular structures in the neck and at the skull base are patent. Minimal atherosclerosis for age. Dominant left vertebral artery. Limited intracranial: Negative for age. Visualized orbits: Postoperative changes to both globes, otherwise negative. Mastoids and visualized paranasal sinuses: Clear. Skeleton: Absent dentition. Widespread cervical spine disc and endplate degeneration. No acute or suspicious osseous lesion. Upper chest: Mild apical lung scarring. No upper lung pulmonary nodule. No superior mediastinal lymphadenopathy. However, there are small but  asymmetric left axillary lymph nodes visible in the upper chest (series 4, image 37) up to 10 mm short axis. IMPRESSION: 1. 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 Small but suspicious right level 2A lymph node (series 3, image 48). Recommend ENT consultation. 2. No other mass or lymphadenopathy in the neck. Asymmetric left axillary lymph nodes are noted although still normal by size criteria. Query ipsilateral COVID-19 vaccination. Electronically Signed   By: Genevie Ann M.D.   On: 09/26/2019 20:34    ASSESSMENT & PLAN Amy [name] 84 y.o. female with medical history significant for atrial fibrillation, COPD, GERD, osteoporosis, hypothyroidism, and HLD who presents for evaluation of newly diagnosed DLBCL of the tonsil.  After review the labs, review the imaging, and review of the records the patient's findings are most consistent with a diffuse large B-cell lymphoma of the right tonsil.  At this time the only imaging we have is a plain CT scan of the neck and therefore before we proceed we will need a full PET CT scan in order to determine extent of disease.  Additionally we are awaiting the results from the [facility] panel for MYC, BCL-2, and BCL6.  Our focus today will be on laying the ground work for treatment moving forward.  We will collect hepatitis B panel, CBC, CMP, LDH, and uric acid.  Distally we will order a baseline transthoracic echocardiogram in order to assess for cardiac function.  Given her advanced age and known heart history of atrial fibrillation I would be concerned about using anthracycline therapy in this patient.  As such we consider alternatives such as mini R-CHOP, or even RGCVP chemotherapy.  Additionally given her advanced age I would not rule out comfort based care measures only, though she does have a reasonable functional status with an ECOG approximately  2.  We will have detailed goals of care discussions and risk-benefit discussions once the full extent of her cancer is understood from our above work-up.  # Diffuse Large B Cell Lymphoma, Workup Underway --will complete staging with a PET CT scan --awaiting results from [facility] for MYC, BCL2 and BCL6 --groundwork labs today to include Hep B panel, CBC, CMP, LDH, and uric acid --will order baseline TTE in the event we proceed with anthracycline therapy (considerably less likely).  --plan for RTC once PET has resulted. Hold on port and chemotherapy education until extent of disease is determined.   Orders Placed This Encounter  Procedures   CBC with Differential ([place] Only)    Standing Status:   Future    Number of Occurrences:   1    Standing Expiration Date:   10/07/2020   Retic Panel    Standing Status:   Future    Number of Occurrences:   1    Standing Expiration Date:  10/07/2020   CMP ([place] only)    Standing Status:   Future    Number of Occurrences:   1    Standing Expiration Date:   10/07/2020   Lactate dehydrogenase (LDH)    Standing Status:   Future    Number of Occurrences:   1    Standing Expiration Date:   10/07/2020   Uric acid    Standing Status:   Future    Number of Occurrences:   1    Standing Expiration Date:   10/07/2020   Hepatitis B core antibody, total    Standing Status:   Future    Number of Occurrences:   1    Standing Expiration Date:   10/07/2020   Hepatitis B surface antibody    Standing Status:   Future    Number of Occurrences:   1    Standing Expiration Date:   10/07/2020   Hepatitis B surface antigen    Standing Status:   Future    Number of Occurrences:   1    Standing Expiration Date:   10/07/2020    All questions were answered. The patient knows to call the clinic with any problems, questions or concerns.  A total of more than 60 minutes were spent on this encounter and over half of that time was spent on counseling and  coordination of care as outlined above.   Ledell Peoples, MD Department of Hematology/Oncology [place] at [facility] Phone: [phone] Pager: [phone] Email: [email or web address]  10/08/2019 8:35 PM

## 2019-10-10 ENCOUNTER — Telehealth: Payer: Self-pay | Admitting: *Deleted

## 2019-10-10 HISTORY — PX: TRANSTHORACIC ECHOCARDIOGRAM: SHX275

## 2019-10-10 NOTE — Telephone Encounter (Signed)
Received call from patient. She states she wanted Dr. Lorenso Courier to know that she has saline breast implants. Pt is scheduled to have PET in the next week or so.  Dr. Lorenso Courier made aware.

## 2019-10-21 ENCOUNTER — Other Ambulatory Visit: Payer: Self-pay

## 2019-10-21 ENCOUNTER — Ambulatory Visit (HOSPITAL_COMMUNITY)
Admission: RE | Admit: 2019-10-21 | Discharge: 2019-10-21 | Disposition: A | Discharge status: Home / Self Care | Payer: Medicare Other | Source: Ambulatory Visit | Attending: Hematology and Oncology | Admitting: Hematology and Oncology

## 2019-10-21 DIAGNOSIS — I700 Atherosclerosis of aorta: Secondary | ICD-10-CM | POA: Diagnosis not present

## 2019-10-21 DIAGNOSIS — I2510 Atherosclerotic heart disease of native coronary artery without angina pectoris: Secondary | ICD-10-CM | POA: Diagnosis not present

## 2019-10-21 DIAGNOSIS — C8331 Diffuse large B-cell lymphoma, lymph nodes of head, face, and neck: Secondary | ICD-10-CM | POA: Diagnosis present

## 2019-10-21 LAB — GLUCOSE, CAPILLARY: Glucose-Capillary: 107 mg/dL — ABNORMAL HIGH (ref 70–99)

## 2019-10-21 MED ORDER — FLUDEOXYGLUCOSE F - 18 (FDG) INJECTION
6.3000 | Freq: Once | INTRAVENOUS | Status: AC | PRN
  Administered 2019-10-21: 6.3 via INTRAVENOUS

## 2019-10-22 ENCOUNTER — Telehealth: Payer: Self-pay | Admitting: Hematology and Oncology

## 2019-10-22 NOTE — Telephone Encounter (Signed)
Called Amy [name] to discuss the results of the PET CT scan.  Findings show that there is a hypermetabolic right tonsillar mass consistent with her biopsy-proven lymphoma, however there are no other lymph nodes which are clearly involved with lymphoma.  There is some mild hyperbolic activity throughout some lymph nodes in the body, however none are clearly lymphomatous.  We will plan to move forward with a light regimen of chemotherapy such as R mini CHOP with consideration of involved site radiation.  Amy [name] voiced her understanding of this.  We will discuss these regimens of treatment in more detail on 10/29/2019.  Ledell Peoples, MD Department of Hematology/Oncology [place] at [facility] Phone: [phone] Pager: [phone] Email: [email or web address]

## 2019-10-23 ENCOUNTER — Telehealth: Payer: Self-pay | Admitting: *Deleted

## 2019-10-23 ENCOUNTER — Other Ambulatory Visit: Payer: Self-pay | Admitting: *Deleted

## 2019-10-23 DIAGNOSIS — C8331 Diffuse large B-cell lymphoma, lymph nodes of head, face, and neck: Secondary | ICD-10-CM

## 2019-10-23 NOTE — Telephone Encounter (Signed)
TCT patient regarding her cardiac echo. Spoke with patient and advised that her appt for this is on October 27, 2019 @ 9am at [facility] . Advised her to arrive at main entrance of the hospital and register at the desk in the main entrance. She voiced understanding.

## 2019-10-27 ENCOUNTER — Other Ambulatory Visit: Payer: Self-pay

## 2019-10-27 ENCOUNTER — Ambulatory Visit (HOSPITAL_COMMUNITY)
Admission: RE | Admit: 2019-10-27 | Discharge: 2019-10-27 | Disposition: A | Discharge status: Home / Self Care | Payer: Medicare Other | Source: Ambulatory Visit | Attending: Hematology and Oncology | Admitting: Hematology and Oncology

## 2019-10-27 DIAGNOSIS — E785 Hyperlipidemia, unspecified: Secondary | ICD-10-CM | POA: Insufficient documentation

## 2019-10-27 DIAGNOSIS — I4891 Unspecified atrial fibrillation: Secondary | ICD-10-CM | POA: Insufficient documentation

## 2019-10-27 DIAGNOSIS — C8331 Diffuse large B-cell lymphoma, lymph nodes of head, face, and neck: Secondary | ICD-10-CM | POA: Insufficient documentation

## 2019-10-27 DIAGNOSIS — Z0189 Encounter for other specified special examinations: Secondary | ICD-10-CM | POA: Diagnosis not present

## 2019-10-27 DIAGNOSIS — J449 Chronic obstructive pulmonary disease, unspecified: Secondary | ICD-10-CM | POA: Diagnosis not present

## 2019-10-27 DIAGNOSIS — Z79899 Other long term (current) drug therapy: Secondary | ICD-10-CM | POA: Diagnosis not present

## 2019-10-27 DIAGNOSIS — Z5181 Encounter for therapeutic drug level monitoring: Secondary | ICD-10-CM | POA: Diagnosis not present

## 2019-10-27 LAB — ECHOCARDIOGRAM COMPLETE
Area-P 1/2: 3.77 cm2
S' Lateral: 2.6 cm

## 2019-10-27 NOTE — Progress Notes (Signed)
  Echocardiogram 2D Echocardiogram has been performed.  Amy [name] 10/27/2019, 9:42 AM

## 2019-10-28 NOTE — Progress Notes (Signed)
[place] [phone]   [phone]  PROGRESS NOTE  Patient Care Team: Amie Critchley as PCP - General (Family Medicine)  Hematological/Oncological History # Diffuse Large B Cell Lymphoma, Stage I 1) 09/26/2019: CT scan of neck performed due to persistent tonsillar lesion after outpatient antibiotic course. CT scan showed a heterogeneous right tonsillar mass up to 3.2 cm and a small but suspicious right level 2A lymph node  2) 09/30/2019: patient underwent biopsy with ENT which revealed a DLBCL with FISH for MYC, BCL2 and BCL6 pending 3) 10/08/2019: establish care with Dr. Lorenso Courier  4) 10/21/2019: PET scan showed hypermetabolic right tonsillar mass and adjacent right level 2 adenopathy, consistent with the given history of lymphoma.  Interval History:  Amy [name] 84 y.o. female with medical history significant for Stage I DLBCL of the tonsil who presents for a follow up visit. The patient's last visit was on 10/08/2019 at which time she established care. In the interim since the last visit she underwent a PET CT scan which showed disease limited to the tonsil.   On exam today Amy [name] companied by her son-in-law.  Reports that her appetite has been somewhat poor but that she has not had any discomfort with swallowing.  She reports that she is not had any issues with fevers, chills, sweats, nausea, vomiting or diarrhea in the interim since our last visit.  We now have the final results of the PET CT scan and the bulk of our discussion today focused on the treatment options moving forward.  The details of this discussion are listed below.  A full 10 point ROS is also listed below.  MEDICAL HISTORY:  Past Medical History:  Diagnosis Date  . Anxiety   . Arthritis   . Atrial fibrillation ([place])   . COPD (chronic obstructive pulmonary disease) ([place])   . Esophageal stricture    s/p repeated dilations, Dr. Watt Climes  . GERD (gastroesophageal reflux  disease)   . Hearing loss bilateral   has hearing aids  . Heart murmur   . Hiatal hernia   . History of blood transfusion    many years ago   . Hyperlipidemia   . Hypothyroidism   . Osteoarthritis   . Osteoporosis   . Pulmonary nodules   . Renal lesion    1cm left kidney    SURGICAL HISTORY: Past Surgical History:  Procedure Laterality Date  . ABDOMINAL HYSTERECTOMY    . BREAST ENHANCEMENT SURGERY  2006  . CATARACT EXTRACTION W/ INTRAOCULAR LENS  IMPLANT, BILATERAL  '09  . ESOPHAGEAL DILATION    . KNEE ARTHROSCOPY  02/22/2011   Procedure: ARTHROSCOPY KNEE;  Surgeon: Gearlean Alf, MD;  Location: [facility];  Service: Orthopedics;  Laterality: Right;  WITH DEBRIDEMENT   . LAPAROSCOPIC SMALL BOWEL RESECTION N/A 06/02/2016   Procedure: DIAGNOSTIC LAPAROSCOPY SMALL BOWEL RESECTION;  Surgeon: Armandina Gemma, MD;  Location: [facility];  Service: General;  Laterality: N/A;    SOCIAL HISTORY: Social History   Socioeconomic History  . Marital status: Widowed    Spouse name: Not on file  . Number of children: 3  . Years of education: 33  . Highest education level: Not on file  Occupational History  . Not on file  Tobacco Use  . Smoking status: Former Smoker    Packs/day: 1.00    Years: 30.00    Pack years: 30.00    Types: Cigarettes    Quit date: 01/09/1997  Years since quitting: 22.8  . Smokeless tobacco: Never Used  Vaping Use  . Vaping Use: Never used  Substance and Sexual Activity  . Alcohol use: No  . Drug use: No  . Sexual activity: Not on file  Other Topics Concern  . Not on file  Social History Narrative   Right handed    Live alone   Caffeine use: 1 cup coffee every morning   Social Determinants of Health   Financial Resource Strain:   . Difficulty of Paying Living Expenses: Not on file  Food Insecurity:   . Worried About Charity fundraiser in the Last Year: Not on file  . Ran Out of Food in the Last Year: Not on file  Transportation  Needs:   . Lack of Transportation (Medical): Not on file  . Lack of Transportation (Non-Medical): Not on file  Physical Activity:   . Days of Exercise per Week: Not on file  . Minutes of Exercise per Session: Not on file  Stress:   . Feeling of Stress : Not on file  Social Connections:   . Frequency of Communication with Friends and Family: Not on file  . Frequency of Social Gatherings with Friends and Family: Not on file  . Attends Religious Services: Not on file  . Active Member of Clubs or Organizations: Not on file  . Attends Archivist Meetings: Not on file  . Marital Status: Not on file  Intimate Partner Violence:   . Fear of Current or Ex-Partner: Not on file  . Emotionally Abused: Not on file  . Physically Abused: Not on file  . Sexually Abused: Not on file    FAMILY HISTORY: Family History  Problem Relation Age of Onset  . Heart disease Father   . Rheum arthritis Father   . Heart failure Mother   . Diabetes Brother     ALLERGIES:  is allergic to other and codeine.  MEDICATIONS:  Current Outpatient Medications  Medication Sig Dispense Refill  . allopurinol (ZYLOPRIM) 300 MG tablet Take 1 tablet (300 mg total) by mouth daily. 90 tablet 1  . ALPRAZolam (XANAX) 0.25 MG tablet Take 0.25 mg by mouth 2 (two) times daily.     Marland Kitchen amLODipine (NORVASC) 5 MG tablet Take 5 mg by mouth daily.    Marland Kitchen aspirin EC 81 MG tablet Take 81 mg by mouth at bedtime.    Marland Kitchen atorvastatin (LIPITOR) 20 MG tablet Take 20 mg by mouth at bedtime.  11  . budesonide-formoterol (SYMBICORT) 160-4.5 MCG/ACT inhaler Inhale 2 puffs into the lungs 2 (two) times daily as needed (for shortness of breath).     . Cholecalciferol (VITAMIN D3) 2000 UNITS TABS Take 2,000 Units by mouth at bedtime.     . fluticasone (FLONASE) 50 MCG/ACT nasal spray Place 1-2 sprays into both nostrils daily as needed for rhinitis.  (Patient not taking: Reported on 10/08/2019)  11  . gabapentin (NEURONTIN) 100 MG capsule TAKE  1 CAP EVERY MORNING, 1 CAP IN THE AFTERNOON, AND 2 CAPS EVERY DAY AT BEDTIME (Patient taking differently: Take 100-200 mg by mouth See admin instructions. Take 100mg  by mouth every morning, take 100mg  by mouth in the afternoon, and take 200mg  by mouth at bedtime) 360 capsule 2  . hydroxypropyl methylcellulose / hypromellose (ISOPTO TEARS / GONIOVISC) 2.5 % ophthalmic solution Place 1-2 drops into both eyes 3 (three) times daily as needed (to soothe dry eyes.). (Patient not taking: Reported on 10/08/2019)    .  levothyroxine (SYNTHROID, LEVOTHROID) 75 MCG tablet Take 1 tablet (75 mcg total) by mouth daily before breakfast. 30 tablet 1  . lidocaine-prilocaine (EMLA) cream Apply 1 application topically as needed. 30 g 0  . metoprolol tartrate (LOPRESSOR) 25 MG tablet Take 0.5 tablets (12.5 mg total) by mouth 2 (two) times daily. (Patient taking differently: Take 12.5 mg by mouth daily. ) 60 tablet 1  . mirtazapine (REMERON) 15 MG tablet Take 15 mg by mouth at bedtime.     . ondansetron (ZOFRAN ODT) 4 MG disintegrating tablet Take 1 tablet (4 mg total) by mouth every 8 (eight) hours as needed for nausea or vomiting. 20 tablet 0  . ondansetron (ZOFRAN) 8 MG tablet Take 1 tablet (8 mg total) by mouth every 8 (eight) hours as needed for nausea or vomiting. 30 tablet 0  . pantoprazole (PROTONIX) 40 MG tablet Take 40 mg by mouth 2 (two) times daily.   11  . Potassium 99 MG TABS Take 99 mg by mouth at bedtime.    . prochlorperazine (COMPAZINE) 10 MG tablet Take 1 tablet (10 mg total) by mouth every 6 (six) hours as needed for nausea or vomiting. 30 tablet 0  . traMADol (ULTRAM) 50 MG tablet Take 50 mg by mouth 2 (two) times daily as needed for moderate pain.     . vitamin B-12 (CYANOCOBALAMIN) 1000 MCG tablet Take 1,000 mcg by mouth daily.     No current facility-administered medications for this visit.    REVIEW OF SYSTEMS:   Constitutional: ( - ) fevers, ( - )  chills , ( - ) night sweats Eyes: ( - )  blurriness of vision, ( - ) double vision, ( - ) watery eyes Ears, nose, mouth, throat, and face: ( - ) mucositis, ( - ) sore throat Respiratory: ( - ) cough, ( - ) dyspnea, ( - ) wheezes Cardiovascular: ( - ) palpitation, ( - ) chest discomfort, ( - ) lower extremity swelling Gastrointestinal:  ( - ) nausea, ( - ) heartburn, ( - ) change in bowel habits Skin: ( - ) abnormal skin rashes Lymphatics: ( - ) new lymphadenopathy, ( - ) easy bruising Neurological: ( - ) numbness, ( - ) tingling, ( - ) new weaknesses Behavioral/Psych: ( - ) mood change, ( - ) new changes  All other systems were reviewed with the patient and are negative.  PHYSICAL EXAMINATION: ECOG PERFORMANCE STATUS: 2 - Symptomatic, <50% confined to bed  Vitals:   10/29/19 1559  BP: 130/60  Pulse: 71  Resp: 17  Temp: (!) 97.2 F (36.2 C)  SpO2: 96%   Filed Weights   10/29/19 1559  Weight: 130 lb 14.4 oz (59.4 kg)    GENERAL: well appearing elderly Caucasian female in NAD  SKIN: skin color, texture, turgor are normal, no rashes or significant lesions EYES: conjunctiva are pink and non-injected, sclera clear OROPHARYNX: large tonsillar lesion abutting the uvula. Overlying erythema but no discharge. no exudate, no erythema; lips, buccal mucosa, and tongue normal  NECK: supple, non-tender LYMPH:  no palpable lymphadenopathy in the cervical, axillary or supraclavicular lymph nodes.  LUNGS: clear to auscultation and percussion with normal breathing effort HEART: regular rate & rhythm and no murmurs and no lower extremity edema Musculoskeletal: no cyanosis of digits and no clubbing  PSYCH: alert & oriented x 3, fluent speech NEURO: no focal motor/sensory deficits  LABORATORY DATA:  I have reviewed the data as listed CBC Latest Ref Rng & Units 10/29/2019  10/08/2019 02/11/2019  WBC 4.0 - 10.5 K/uL 9.1 11.5(H) 6.9  Hemoglobin 12.0 - 15.0 g/dL 12.6 14.0 12.2  Hematocrit 36 - 46 % 38.9 43.2 37.4  Platelets 150 - 400 K/uL  319 301 301    CMP Latest Ref Rng & Units 10/29/2019 10/08/2019 02/11/2019  Glucose 70 - 99 mg/dL 104(H) 114(H) 98  BUN 8 - 23 mg/dL 8 7(L) 6(L)  Creatinine 0.44 - 1.00 mg/dL 0.82 0.78 0.68  Sodium 135 - 145 mmol/L 139 138 134(L)  Potassium 3.5 - 5.1 mmol/L 4.0 3.7 3.5  Chloride 98 - 111 mmol/L 101 98 100  CO2 22 - 32 mmol/L 34(H) 31 26  Calcium 8.9 - 10.3 mg/dL 9.5 9.8 9.0  Total Protein 6.5 - 8.1 g/dL 7.9 8.7(H) 7.8  Total Bilirubin 0.3 - 1.2 mg/dL 0.7 0.7 1.2  Alkaline Phos 38 - 126 U/L 114 130(H) 81  AST 15 - 41 U/L 27 26 26   ALT 0 - 44 U/L 16 20 14    RADIOGRAPHIC STUDIES: I have personally reviewed the radiological images as listed and agreed with the findings in the report: hypermetabolic right tonsillar mass.   NM PET Image Initial (PI) Skull Base To Thigh  Result Date: 10/22/2019 CLINICAL DATA:  Initial treatment strategy for large B-cell lymphoma. EXAM: NUCLEAR MEDICINE PET SKULL BASE TO THIGH TECHNIQUE: 6.3 mCi F-18 FDG was injected intravenously. Full-ring PET imaging was performed from the skull base to thigh after the radiotracer. CT data was obtained and used for attenuation correction and anatomic localization. Fasting blood glucose: 107 mg/dl COMPARISON:  CT neck 09/26/2019 and CT abdomen pelvis 02/03/2017. FINDINGS: Mediastinal blood pool activity: SUV max 3.25 Liver activity: SUV max 4.6 NECK: Asymmetric hypermetabolic right tonsillar mass, SUV max 23.5. Mass was better on 09/26/2019. Adjacent right level 2 11 mm lymph node with an SUV max of 24.3. 5 mm left level 2 lymph node (4/32) with an SUV max of 3.3. Incidental CT findings: None. CHEST: Hypermetabolic left subpectoral/left axillary lymph nodes. Index lateral left subpectoral lymph node measures 7 mm (4/59) with an SUV of 3.9. No hypermetabolic mediastinal, hilar or right axillary lymph nodes. No hypermetabolic pulmonary nodules. Incidental CT findings: Atherosclerotic calcification of the aorta and coronary arteries.  Heart is enlarged. No pericardial or pleural effusion. Biapical pleuroparenchymal scarring. ABDOMEN/PELVIS: No abnormal hypermetabolism in the liver, adrenal glands, spleen or pancreas. Hypermetabolic external iliac lymph nodes measure up to 7 mm on the right (4/156) with an SUV max of 2.9. Metabolic inguinal lymph nodes measure up to 9 mm on the right (4/165) with an SUV max 3.6. No additional hypermetabolic lymph nodes. Incidental CT findings: Liver, adrenal glands, kidneys, spleen, pancreas and stomach are grossly unremarkable. Specifically, spleen is normal in size. Postoperative changes in the small bowel. No free fluid. Atherosclerotic calcification of the aorta without aneurysm. SKELETON: No abnormal hypermetabolism. Incidental CT findings: Degenerative changes in the spine. IMPRESSION: 1. Hypermetabolic right tonsillar mass and adjacent right level 2 adenopathy, consistent with the given history of lymphoma. Additional mildly hypermetabolic lymph nodes in the left neck, left axilla, pelvic retroperitoneum and inguinal regions. 2. Normal spleen size. 3. Aortic atherosclerosis (ICD10-I70.0). Coronary artery calcification. Electronically Signed   By: Lorin Picket M.D.   On: 10/22/2019 09:08   ECHOCARDIOGRAM COMPLETE  Result Date: 10/27/2019    ECHOCARDIOGRAM REPORT   Patient Name:   Amy [name] Date of Exam: 10/27/2019 Medical Rec #:  [id number]    Height:       65.0  in Accession #:    [id number]   Weight:       131.7 lb Date of Birth:  11-05-1935    BSA:          1.656 m Patient Age:    57 years     BP:           150/77 mmHg Patient Gender: F            HR:           79 bpm. Exam Location:  Outpatient Procedure: 2D Echo, Cardiac Doppler and Color Doppler Indications:    Chemo evaluation  History:        Patient has prior history of Echocardiogram examinations, most                 recent 07/29/2018. COPD, Arrythmias:Atrial Fibrillation; Risk                 Factors:Dyslipidemia. Lymphoma.  Sonographer:     Dustin Flock Referring Phys: [id number] [place]  1. Left ventricular ejection fraction, by estimation, is 60 to 65%. The left ventricle has normal function. The left ventricle has no regional wall motion abnormalities. Left ventricular diastolic parameters are consistent with Grade I diastolic dysfunction (impaired relaxation).  2. Right ventricular systolic function is normal. The right ventricular size is normal. There is normal pulmonary artery systolic pressure.  3. The mitral valve is grossly normal. Trivial mitral valve regurgitation.  4. The aortic valve is tricuspid. Aortic valve regurgitation is not visualized.  5. The inferior vena cava is normal in size with greater than 50% respiratory variability, suggesting right atrial pressure of 3 mmHg. Comparison(s): No significant change from prior study. FINDINGS  Left Ventricle: Left ventricular ejection fraction, by estimation, is 60 to 65%. The left ventricle has normal function. The left ventricle has no regional wall motion abnormalities. Global longitudinal strain performed but not reported based on interpreter judgement due to suboptimal tracking. The left ventricular internal cavity size was normal in size. There is no left ventricular hypertrophy. Left ventricular diastolic parameters are consistent with Grade I diastolic dysfunction (impaired relaxation). Right Ventricle: The right ventricular size is normal. No increase in right ventricular wall thickness. Right ventricular systolic function is normal. There is normal pulmonary artery systolic pressure. The tricuspid regurgitant velocity is 2.59 m/s, and  with an assumed right atrial pressure of 3 mmHg, the estimated right ventricular systolic pressure is 80.3 mmHg. Left Atrium: Left atrial size was normal in size. Right Atrium: Right atrial size was normal in size. Pericardium: There is no evidence of pericardial effusion. Mitral Valve: The mitral valve is grossly normal.  Trivial mitral valve regurgitation. Tricuspid Valve: The tricuspid valve is not well visualized. Tricuspid valve regurgitation is trivial. Aortic Valve: The aortic valve is tricuspid. Aortic valve regurgitation is not visualized. Pulmonic Valve: The pulmonic valve was grossly normal. Pulmonic valve regurgitation is trivial. Aorta: The aortic root is normal in size and structure and the ascending aorta was not well visualized. Venous: The pulmonary veins were not well visualized. The inferior vena cava is normal in size with greater than 50% respiratory variability, suggesting right atrial pressure of 3 mmHg. IAS/Shunts: The atrial septum is grossly normal.  LEFT VENTRICLE PLAX 2D LVIDd:         4.20 cm  Diastology LVIDs:         2.60 cm  LV e' medial:    4.90 cm/s LV PW:  1.10 cm  LV E/e' medial:  14.2 LV IVS:        0.90 cm  LV e' lateral:   4.68 cm/s LVOT diam:     1.90 cm  LV E/e' lateral: 14.9 LV SV:         61 LV SV Index:   37 LVOT Area:     2.84 cm  RIGHT VENTRICLE RV Basal diam:  2.70 cm RV S prime:     5.22 cm/s TAPSE (M-mode): 2.4 cm LEFT ATRIUM             Index       RIGHT ATRIUM           Index LA diam:        3.60 cm 2.17 cm/m  RA Area:     10.40 cm LA Vol (A2C):   36.4 ml 21.98 ml/m RA Volume:   20.50 ml  12.38 ml/m LA Vol (A4C):   36.6 ml 22.10 ml/m LA Biplane Vol: 36.7 ml 22.16 ml/m  AORTIC VALVE LVOT Vmax:   92.00 cm/s LVOT Vmean:  70.100 cm/s LVOT VTI:    0.214 m  AORTA Ao Root diam: 2.60 cm MITRAL VALVE               TRICUSPID VALVE MV Area (PHT): 3.77 cm    TR Peak grad:   26.8 mmHg MV Decel Time: 201 msec    TR Vmax:        259.00 cm/s MV E velocity: 69.80 cm/s MV A velocity: 72.80 cm/s  SHUNTS MV E/A ratio:  0.96        Systemic VTI:  0.21 m                            Systemic Diam: 1.90 cm Rudean Haskell MD Electronically signed by Rudean Haskell MD Signature Date/Time: 10/27/2019/4:15:30 PM    Final     ASSESSMENT & PLAN Amy [name] 84 y.o. female with medical  history significant for Stage I DLBCL of the tonsil who presents for a follow up visit.  After review the labs, the records, discussion with the patient the findings most consistent with a stage I diffuse large B-cell lymphoma of the tonsil.  She has involvement of the right tonsil as well as a local lymph node which would constitute stage I disease.  She does have some mild activity in other lymph nodes throughout the body, but nowhere near as active as the ones within her head neck.  Lymphomas involvement of these lymph nodes is not likely and therefore I would proceed with treatment as if the patient were a stage I.  Today we discussed the diagnosis of diffuse large B-cell lymphoma and the treatment options moving forward.  The regimen most recommended for this patient would be R mini CHOP with consideration for 3 cycles followed by radiation to the local lymph nodes.  This is consistent with the treatment course to be recommended with R-CHOP chemotherapy for stage I disease.  We are still currently waiting for the results of the double hit panel, however this would not likely change management as the patient would not be able to tolerate full strength chemotherapy with something like R-EPOCH.  We also discussed the risks and benefits of this R-CHOP chemotherapy including constipation, neurotoxicity, cardiac toxicity, drop in blood counts, fatigue, nausea, vomiting, and alopecia.  The patient voiced her understanding of these complications and wished to  proceed forward with treatment.  The regimen of R- miniCHOP consists of rituximab 325 mg per metered squared, cyclophosphamide 400 mg per metered squared IV, doxorubicin 25 mg per metered squared IV, vincristine 1 mg IV, and prednisone 40 mg per metered squared p.o. on days 1 through 5.  This will be pursued for 3 cycles of chemotherapy with the addition of radiation therapy.  Based on NCCN recommendations, member institutes do practice this regimen, though  data is not robust.  # Diffuse Large B Cell Lymphoma, Stage I  --complete staging with a PET CT scan, findings most consistent with a Stage I DLBCL of the tonsil.  --awaiting results from [facility] for [place], BCL2 and BCL6. This would not change initial management as I would not pursue R-EPOCH or intensive chemotherapy at her age.  -- TTE shows EF 60-65% with mild diastolic dysfunction --will schedule port placement and chemotherapy education --tentatively plan for 3 cycles followed by local radiation.  --RTC in 2 weeks to start treatment with R-miniCHOP  #Supportive Care --chemotherapy education to be scheduled  --zofran 8mg  q8H PRN and compazine 10mg  PO q6H for nausea -- allopurinol 300mg  PO daily for TLS prophylaxis -- EMLA cream for port -- no pain medication required at this time.    Orders Placed This Encounter  Procedures  . IR Perc Tun Perit Cath W/Port    Standing Status:   Future    Standing Expiration Date:   10/28/2020    Order Specific Question:   Reason for exam:    Answer:   port for chemotherapy, labs in DLBCL    Order Specific Question:   Preferred Imaging Location?    Answer:   [facility]    All questions were answered. The patient knows to call the clinic with any problems, questions or concerns.  A total of more than 30 minutes were spent on this encounter and over half of that time was spent on counseling and coordination of care as outlined above.   Ledell Peoples, MD Department of Hematology/Oncology [place] at [facility] Phone: [phone] Pager: [phone] Email: [email or web address]  10/29/2019 6:34 PM

## 2019-10-29 ENCOUNTER — Inpatient Hospital Stay: Payer: Medicare Other | Attending: Hematology and Oncology | Admitting: Hematology and Oncology

## 2019-10-29 ENCOUNTER — Inpatient Hospital Stay: Payer: Medicare Other

## 2019-10-29 ENCOUNTER — Encounter: Payer: Self-pay | Admitting: Hematology and Oncology

## 2019-10-29 ENCOUNTER — Other Ambulatory Visit: Payer: Self-pay

## 2019-10-29 ENCOUNTER — Other Ambulatory Visit: Payer: Self-pay | Admitting: *Deleted

## 2019-10-29 VITALS — BP 130/60 | HR 71 | Temp 97.2°F | Resp 17 | Ht 65.0 in | Wt 130.9 lb

## 2019-10-29 DIAGNOSIS — Z87891 Personal history of nicotine dependence: Secondary | ICD-10-CM | POA: Insufficient documentation

## 2019-10-29 DIAGNOSIS — Z79899 Other long term (current) drug therapy: Secondary | ICD-10-CM | POA: Diagnosis not present

## 2019-10-29 DIAGNOSIS — I4891 Unspecified atrial fibrillation: Secondary | ICD-10-CM | POA: Insufficient documentation

## 2019-10-29 DIAGNOSIS — Z8261 Family history of arthritis: Secondary | ICD-10-CM | POA: Insufficient documentation

## 2019-10-29 DIAGNOSIS — J449 Chronic obstructive pulmonary disease, unspecified: Secondary | ICD-10-CM | POA: Insufficient documentation

## 2019-10-29 DIAGNOSIS — Z8249 Family history of ischemic heart disease and other diseases of the circulatory system: Secondary | ICD-10-CM | POA: Diagnosis not present

## 2019-10-29 DIAGNOSIS — Z7951 Long term (current) use of inhaled steroids: Secondary | ICD-10-CM | POA: Insufficient documentation

## 2019-10-29 DIAGNOSIS — K219 Gastro-esophageal reflux disease without esophagitis: Secondary | ICD-10-CM | POA: Diagnosis not present

## 2019-10-29 DIAGNOSIS — E039 Hypothyroidism, unspecified: Secondary | ICD-10-CM | POA: Diagnosis not present

## 2019-10-29 DIAGNOSIS — F419 Anxiety disorder, unspecified: Secondary | ICD-10-CM | POA: Diagnosis not present

## 2019-10-29 DIAGNOSIS — C8331 Diffuse large B-cell lymphoma, lymph nodes of head, face, and neck: Secondary | ICD-10-CM

## 2019-10-29 DIAGNOSIS — E785 Hyperlipidemia, unspecified: Secondary | ICD-10-CM | POA: Diagnosis not present

## 2019-10-29 DIAGNOSIS — Z9049 Acquired absence of other specified parts of digestive tract: Secondary | ICD-10-CM | POA: Insufficient documentation

## 2019-10-29 DIAGNOSIS — Z833 Family history of diabetes mellitus: Secondary | ICD-10-CM | POA: Diagnosis not present

## 2019-10-29 DIAGNOSIS — Z7982 Long term (current) use of aspirin: Secondary | ICD-10-CM | POA: Diagnosis not present

## 2019-10-29 DIAGNOSIS — Z90710 Acquired absence of both cervix and uterus: Secondary | ICD-10-CM | POA: Insufficient documentation

## 2019-10-29 LAB — CBC WITH DIFFERENTIAL (CANCER CENTER ONLY)
Abs Immature Granulocytes: 0.01 10*3/uL (ref 0.00–0.07)
Basophils Absolute: 0 10*3/uL (ref 0.0–0.1)
Basophils Relative: 0 %
Eosinophils Absolute: 0.2 10*3/uL (ref 0.0–0.5)
Eosinophils Relative: 2 %
HCT: 38.9 % (ref 36.0–46.0)
Hemoglobin: 12.6 g/dL (ref 12.0–15.0)
Immature Granulocytes: 0 %
Lymphocytes Relative: 71 %
Lymphs Abs: 6.5 10*3/uL — ABNORMAL HIGH (ref 0.7–4.0)
MCH: 31.4 pg (ref 26.0–34.0)
MCHC: 32.4 g/dL (ref 30.0–36.0)
MCV: 97 fL (ref 80.0–100.0)
Monocytes Absolute: 0.9 10*3/uL (ref 0.1–1.0)
Monocytes Relative: 10 %
Neutro Abs: 1.5 10*3/uL — ABNORMAL LOW (ref 1.7–7.7)
Neutrophils Relative %: 17 %
Platelet Count: 319 10*3/uL (ref 150–400)
RBC: 4.01 MIL/uL (ref 3.87–5.11)
RDW: 13.5 % (ref 11.5–15.5)
WBC Count: 9.1 10*3/uL (ref 4.0–10.5)
nRBC: 0 % (ref 0.0–0.2)

## 2019-10-29 LAB — CMP (CANCER CENTER ONLY)
ALT: 16 U/L (ref 0–44)
AST: 27 U/L (ref 15–41)
Albumin: 3.6 g/dL (ref 3.5–5.0)
Alkaline Phosphatase: 114 U/L (ref 38–126)
Anion gap: 4 — ABNORMAL LOW (ref 5–15)
BUN: 8 mg/dL (ref 8–23)
CO2: 34 mmol/L — ABNORMAL HIGH (ref 22–32)
Calcium: 9.5 mg/dL (ref 8.9–10.3)
Chloride: 101 mmol/L (ref 98–111)
Creatinine: 0.82 mg/dL (ref 0.44–1.00)
GFR, Estimated: >60 mL/min (ref >60)
Glucose, Bld: 104 mg/dL — ABNORMAL HIGH (ref 70–99)
Potassium: 4 mmol/L (ref 3.5–5.1)
Sodium: 139 mmol/L (ref 135–145)
Total Bilirubin: 0.7 mg/dL (ref 0.3–1.2)
Total Protein: 7.9 g/dL (ref 6.5–8.1)

## 2019-10-29 MED ORDER — PROCHLORPERAZINE MALEATE 10 MG PO TABS
10.0000 mg | ORAL_TABLET | Freq: Four times a day (QID) | ORAL | 0 refills | Status: DC | PRN
Start: 2019-10-29 — End: 2020-09-20

## 2019-10-29 MED ORDER — LIDOCAINE-PRILOCAINE 2.5-2.5 % EX CREA
1.0000 | TOPICAL_CREAM | CUTANEOUS | 0 refills | Status: DC | PRN
Start: 2019-10-29 — End: 2020-09-20

## 2019-10-29 MED ORDER — ONDANSETRON HCL 8 MG PO TABS
8.0000 mg | ORAL_TABLET | Freq: Three times a day (TID) | ORAL | 0 refills | Status: DC | PRN
Start: 2019-10-29 — End: 2020-09-20

## 2019-10-29 MED ORDER — ALLOPURINOL 300 MG PO TABS
300.0000 mg | ORAL_TABLET | Freq: Every day | ORAL | 1 refills | Status: DC
Start: 2019-10-29 — End: 2020-02-02

## 2019-10-29 NOTE — Progress Notes (Signed)
START OFF PATHWAY REGIMEN - Lymphoma and CLL   [id number]:R-miniCHOP (Rituximab IV + Cyclophosphamide IV + Doxorubicin IV + Vincristine IV + Prednisone PO) q21 Days x 6 Cycles:   A cycle is every 21 days:     Prednisone      Rituximab-xxxx      Cyclophosphamide      Doxorubicin      Vincristine   **Always confirm dose/schedule in your pharmacy ordering system**  Patient Characteristics: Diffuse Large B-Cell Lymphoma or Follicular Lymphoma, Grade 3B, First Line, Stage I and II, No Bulk Disease Type: Not Applicable Disease Type: Diffuse Large B-Cell Lymphoma Disease Type: Not Applicable Line of therapy: First Line Disease Characteristics: No Bulk Intent of Therapy: Curative Intent, Discussed with Patient

## 2019-11-04 ENCOUNTER — Other Ambulatory Visit: Payer: Self-pay | Admitting: Physician Assistant

## 2019-11-04 MED ORDER — DEXTROSE 5 % IV SOLN
2.0000 g | Freq: Once | INTRAVENOUS | Status: DC
Start: 2019-11-04 — End: 2019-11-04

## 2019-11-05 ENCOUNTER — Ambulatory Visit (HOSPITAL_COMMUNITY)
Admission: RE | Admit: 2019-11-05 | Discharge: 2019-11-05 | Disposition: A | Discharge status: Home / Self Care | Payer: Medicare Other | Source: Ambulatory Visit | Attending: Hematology and Oncology | Admitting: Hematology and Oncology

## 2019-11-05 ENCOUNTER — Encounter (HOSPITAL_COMMUNITY): Payer: Self-pay

## 2019-11-05 ENCOUNTER — Other Ambulatory Visit: Payer: Self-pay | Admitting: Hematology and Oncology

## 2019-11-05 ENCOUNTER — Other Ambulatory Visit: Payer: Self-pay

## 2019-11-05 DIAGNOSIS — C8331 Diffuse large B-cell lymphoma, lymph nodes of head, face, and neck: Secondary | ICD-10-CM

## 2019-11-05 HISTORY — PX: IR IMAGING GUIDED PORT INSERTION: IMG5740

## 2019-11-05 MED ORDER — HEPARIN SOD (PORK) LOCK FLUSH 100 UNIT/ML IV SOLN
INTRAVENOUS | Status: AC
  Filled 2019-11-05: qty 5

## 2019-11-05 MED ORDER — FENTANYL CITRATE (PF) 100 MCG/2ML IJ SOLN
INTRAMUSCULAR | Status: AC
  Filled 2019-11-05: qty 2

## 2019-11-05 MED ORDER — CEFAZOLIN SODIUM-DEXTROSE 2-4 GM/100ML-% IV SOLN
INTRAVENOUS | Status: AC
  Administered 2019-11-05: 2 g via INTRAVENOUS
  Filled 2019-11-05: qty 100

## 2019-11-05 MED ORDER — MIDAZOLAM HCL 2 MG/2ML IJ SOLN
INTRAMUSCULAR | Status: AC | PRN
  Administered 2019-11-05: 1 mg via INTRAVENOUS

## 2019-11-05 MED ORDER — CEFAZOLIN SODIUM-DEXTROSE 2-4 GM/100ML-% IV SOLN: 2.0000 g | INTRAVENOUS | Status: AC

## 2019-11-05 MED ORDER — LIDOCAINE-EPINEPHRINE 1 %-1:100000 IJ SOLN
INTRAMUSCULAR | Status: AC | PRN
  Administered 2019-11-05: 10 mL

## 2019-11-05 MED ORDER — LIDOCAINE-EPINEPHRINE 1 %-1:100000 IJ SOLN
INTRAMUSCULAR | Status: AC
  Filled 2019-11-05: qty 1

## 2019-11-05 MED ORDER — MIDAZOLAM HCL 2 MG/2ML IJ SOLN
INTRAMUSCULAR | Status: AC
  Filled 2019-11-05: qty 4

## 2019-11-05 MED ORDER — LIDOCAINE HCL (PF) 1 % IJ SOLN
INTRAMUSCULAR | Status: AC | PRN
  Administered 2019-11-05: 5 mL

## 2019-11-05 MED ORDER — HEPARIN SOD (PORK) LOCK FLUSH 100 UNIT/ML IV SOLN
INTRAVENOUS | Status: AC | PRN
  Administered 2019-11-05: 500 [IU] via INTRAVENOUS

## 2019-11-05 MED ORDER — FENTANYL CITRATE (PF) 100 MCG/2ML IJ SOLN
INTRAMUSCULAR | Status: AC | PRN
Start: 2019-11-05 — End: 2019-11-05
  Administered 2019-11-05: 50 ug via INTRAVENOUS

## 2019-11-05 MED ORDER — SODIUM CHLORIDE 0.9 % IV SOLN: INTRAVENOUS | Status: DC

## 2019-11-05 NOTE — Discharge Instructions (Signed)

## 2019-11-05 NOTE — H&P (Signed)
Chief Complaint: Patient was seen in consultation today for port-a-catheter placement  Referring Physician(s): Orson Slick  Supervising Physician: Jacqulynn Cadet  Patient Status: [facility] - Out-pt  History of Present Illness: Amy [name] is an 84 y.o. female with a medical history significant for atrial fibrillation (not on anticoagulation), COPD, and a new diagnosis of Large B Cell Lymphoma. A CT scan of the neck was done 09/26/19 due to a persistent tonsilar lesion. This showed a heterogeneous right tonsillar mass and some lymphadenopathy. A biopsy was performed 09/30/19 and pathology returned positive for lymphoma. Her oncology team is preparing her to start chemotherapy soon.  Interventional Radiology has been asked to evaluate this patient for an image-guided port-a-catheter placement to facilitate her treatment plans.  Past Medical History:  Diagnosis Date  . Anxiety   . Arthritis   . Atrial fibrillation ([place])   . COPD (chronic obstructive pulmonary disease) ([place])   . Esophageal stricture    s/p repeated dilations, Dr. Watt Climes  . GERD (gastroesophageal reflux disease)   . Hearing loss bilateral   has hearing aids  . Heart murmur   . Hiatal hernia   . History of blood transfusion    many years ago   . Hyperlipidemia   . Hypothyroidism   . Osteoarthritis   . Osteoporosis   . Pulmonary nodules   . Renal lesion    1cm left kidney    Past Surgical History:  Procedure Laterality Date  . ABDOMINAL HYSTERECTOMY    . BREAST ENHANCEMENT SURGERY  2006  . CATARACT EXTRACTION W/ INTRAOCULAR LENS  IMPLANT, BILATERAL  '09  . ESOPHAGEAL DILATION    . KNEE ARTHROSCOPY  02/22/2011   Procedure: ARTHROSCOPY KNEE;  Surgeon: Gearlean Alf, MD;  Location: [facility];  Service: Orthopedics;  Laterality: Right;  WITH DEBRIDEMENT   . LAPAROSCOPIC SMALL BOWEL RESECTION N/A 06/02/2016   Procedure: DIAGNOSTIC LAPAROSCOPY SMALL BOWEL RESECTION;  Surgeon: Armandina Gemma,  MD;  Location: [facility];  Service: General;  Laterality: N/A;    Allergies: Other and Codeine  Medications: Prior to Admission medications   Medication Sig Start Date End Date Taking? Authorizing Provider  ALPRAZolam (XANAX) 0.25 MG tablet Take 0.25 mg by mouth 2 (two) times daily.    Yes [provider]  amLODipine (NORVASC) 5 MG tablet Take 5 mg by mouth daily. 09/12/19  Yes [provider]  aspirin EC 81 MG tablet Take 81 mg by mouth at bedtime.   Yes [provider]  atorvastatin (LIPITOR) 20 MG tablet Take 20 mg by mouth at bedtime.   Yes [provider]  budesonide-formoterol (SYMBICORT) 160-4.5 MCG/ACT inhaler Inhale 2 puffs into the lungs 2 (two) times daily as needed (for shortness of breath).    Yes Tanda Rockers, MD  Cholecalciferol (VITAMIN D3) 2000 UNITS TABS Take 2,000 Units by mouth at bedtime.    Yes [provider]  fluticasone (FLONASE) 50 MCG/ACT nasal spray Place 1-2 sprays into both nostrils daily as needed for rhinitis.    Yes [provider]  gabapentin (NEURONTIN) 100 MG capsule TAKE 1 CAP EVERY MORNING, 1 CAP IN THE AFTERNOON, AND 2 CAPS EVERY DAY AT BEDTIME Patient taking differently: Take 100-200 mg by mouth See admin instructions. Take 100mg  by mouth every morning, take 100mg  by mouth in the afternoon, and take 200mg  by mouth at bedtime 04/15/18  Yes Sater, Nanine Means, MD  levothyroxine (SYNTHROID, LEVOTHROID) 75 MCG tablet Take 1 tablet (75 mcg  total) by mouth daily before breakfast. 03/27/16  Yes Tat, Shanon Brow, MD  metoprolol tartrate (LOPRESSOR) 25 MG tablet Take 0.5 tablets (12.5 mg total) by mouth 2 (two) times daily. Patient taking differently: Take 12.5 mg by mouth daily.  03/26/16  Yes Tat, Shanon Brow, MD  mirtazapine (REMERON) 15 MG tablet Take 15 mg by mouth at bedtime.    Yes [provider]  pantoprazole (PROTONIX) 40 MG tablet Take 40 mg by mouth 2 (two) times daily.    Yes [provider]   Potassium 99 MG TABS Take 99 mg by mouth at bedtime.   Yes [provider]  traMADol (ULTRAM) 50 MG tablet Take 50 mg by mouth 2 (two) times daily as needed for moderate pain.    Yes [provider]  vitamin B-12 (CYANOCOBALAMIN) 1000 MCG tablet Take 1,000 mcg by mouth daily.   Yes [provider]  allopurinol (ZYLOPRIM) 300 MG tablet Take 1 tablet (300 mg total) by mouth daily. 10/29/19   Orson Slick, MD  hydroxypropyl methylcellulose / hypromellose (ISOPTO TEARS / GONIOVISC) 2.5 % ophthalmic solution Place 1-2 drops into both eyes 3 (three) times daily as needed (to soothe dry eyes.).     [provider]  lidocaine-prilocaine (EMLA) cream Apply 1 application topically as needed. 10/29/19   Orson Slick, MD  ondansetron (ZOFRAN ODT) 4 MG disintegrating tablet Take 1 tablet (4 mg total) by mouth every 8 (eight) hours as needed for nausea or vomiting. 02/03/17   Ward, Ozella Almond, PA-C  ondansetron (ZOFRAN) 8 MG tablet Take 1 tablet (8 mg total) by mouth every 8 (eight) hours as needed for nausea or vomiting. 10/29/19   Orson Slick, MD  prochlorperazine (COMPAZINE) 10 MG tablet Take 1 tablet (10 mg total) by mouth every 6 (six) hours as needed for nausea or vomiting. 10/29/19   Orson Slick, MD     Family History  Problem Relation Age of Onset  . Heart disease Father   . Rheum arthritis Father   . Heart failure Mother   . Diabetes Brother     Social History   Socioeconomic History  . Marital status: Widowed    Spouse name: Not on file  . Number of children: 3  . Years of education: 3  . Highest education level: Not on file  Occupational History  . Not on file  Tobacco Use  . Smoking status: Former Smoker    Packs/day: 1.00    Years: 30.00    Pack years: 30.00    Types: Cigarettes    Quit date: 01/09/1997    Years since quitting: 22.8  . Smokeless tobacco: Never Used  Vaping Use  . Vaping Use: Never used  Substance and  Sexual Activity  . Alcohol use: No  . Drug use: No  . Sexual activity: Not on file  Other Topics Concern  . Not on file  Social History Narrative   Right handed    Live alone   Caffeine use: 1 cup coffee every morning   Social Determinants of Health   Financial Resource Strain:   . Difficulty of Paying Living Expenses: Not on file  Food Insecurity:   . Worried About Charity fundraiser in the Last Year: Not on file  . Ran Out of Food in the Last Year: Not on file  Transportation Needs:   . Lack of Transportation (Medical): Not on file  . Lack of Transportation (Non-Medical): Not on  file  Physical Activity:   . Days of Exercise per Week: Not on file  . Minutes of Exercise per Session: Not on file  Stress:   . Feeling of Stress : Not on file  Social Connections:   . Frequency of Communication with Friends and Family: Not on file  . Frequency of Social Gatherings with Friends and Family: Not on file  . Attends Religious Services: Not on file  . Active Member of Clubs or Organizations: Not on file  . Attends Archivist Meetings: Not on file  . Marital Status: Not on file    Review of Systems: A 12 point ROS discussed and pertinent positives are indicated in the HPI above.  All other systems are negative.  Review of Systems  Constitutional: Negative for appetite change and fatigue.  Respiratory: Negative for cough and shortness of breath.   Cardiovascular: Negative for chest pain and leg swelling.  Gastrointestinal: Negative for abdominal pain, diarrhea, nausea and vomiting.  Musculoskeletal: Negative for back pain.  Neurological: Negative for dizziness and headaches.    Vital Signs: BP (!) 153/59 (BP Location: Right Arm)   Pulse 66   Temp 97.7 F (36.5 C) (Oral)   Resp 16   SpO2 93%   Physical Exam Constitutional:      General: She is not in acute distress. HENT:     Nose:     Comments: Upper and lower dentures    Mouth/Throat:     Mouth: Mucous  membranes are moist.     Pharynx: Oropharynx is clear.  Cardiovascular:     Rate and Rhythm: Normal rate and regular rhythm.     Pulses: Normal pulses.     Heart sounds: Normal heart sounds.  Pulmonary:     Effort: Pulmonary effort is normal.     Breath sounds: Normal breath sounds.  Abdominal:     General: Bowel sounds are normal.     Palpations: Abdomen is soft.  Musculoskeletal:        General: Normal range of motion.  Skin:    General: Skin is warm and dry.  Neurological:     Mental Status: She is alert and oriented to person, place, and time.     Imaging: NM PET Image Initial (PI) Skull Base To Thigh  Result Date: 10/22/2019 CLINICAL DATA:  Initial treatment strategy for large B-cell lymphoma. EXAM: NUCLEAR MEDICINE PET SKULL BASE TO THIGH TECHNIQUE: 6.3 mCi F-18 FDG was injected intravenously. Full-ring PET imaging was performed from the skull base to thigh after the radiotracer. CT data was obtained and used for attenuation correction and anatomic localization. Fasting blood glucose: 107 mg/dl COMPARISON:  CT neck 09/26/2019 and CT abdomen pelvis 02/03/2017. FINDINGS: Mediastinal blood pool activity: SUV max 3.25 Liver activity: SUV max 4.6 NECK: Asymmetric hypermetabolic right tonsillar mass, SUV max 23.5. Mass was better on 09/26/2019. Adjacent right level 2 11 mm lymph node with an SUV max of 24.3. 5 mm left level 2 lymph node (4/32) with an SUV max of 3.3. Incidental CT findings: None. CHEST: Hypermetabolic left subpectoral/left axillary lymph nodes. Index lateral left subpectoral lymph node measures 7 mm (4/59) with an SUV of 3.9. No hypermetabolic mediastinal, hilar or right axillary lymph nodes. No hypermetabolic pulmonary nodules. Incidental CT findings: Atherosclerotic calcification of the aorta and coronary arteries. Heart is enlarged. No pericardial or pleural effusion. Biapical pleuroparenchymal scarring. ABDOMEN/PELVIS: No abnormal hypermetabolism in the liver, adrenal  glands, spleen or pancreas. Hypermetabolic external iliac lymph nodes measure  up to 7 mm on the right (4/156) with an SUV max of 2.9. Metabolic inguinal lymph nodes measure up to 9 mm on the right (4/165) with an SUV max 3.6. No additional hypermetabolic lymph nodes. Incidental CT findings: Liver, adrenal glands, kidneys, spleen, pancreas and stomach are grossly unremarkable. Specifically, spleen is normal in size. Postoperative changes in the small bowel. No free fluid. Atherosclerotic calcification of the aorta without aneurysm. SKELETON: No abnormal hypermetabolism. Incidental CT findings: Degenerative changes in the spine. IMPRESSION: 1. Hypermetabolic right tonsillar mass and adjacent right level 2 adenopathy, consistent with the given history of lymphoma. Additional mildly hypermetabolic lymph nodes in the left neck, left axilla, pelvic retroperitoneum and inguinal regions. 2. Normal spleen size. 3. Aortic atherosclerosis (ICD10-I70.0). Coronary artery calcification. Electronically Signed   By: Lorin Picket M.D.   On: 10/22/2019 09:08   ECHOCARDIOGRAM COMPLETE  Result Date: 10/27/2019    ECHOCARDIOGRAM REPORT   Patient Name:   Amy [name] Date of Exam: 10/27/2019 Medical Rec #:  [id number]    Height:       65.0 in Accession #:    [id number]   Weight:       131.7 lb Date of Birth:  04/11/1935    BSA:          1.656 m Patient Age:    37 years     BP:           150/77 mmHg Patient Gender: F            HR:           79 bpm. Exam Location:  Outpatient Procedure: 2D Echo, Cardiac Doppler and Color Doppler Indications:    Chemo evaluation  History:        Patient has prior history of Echocardiogram examinations, most                 recent 07/29/2018. COPD, Arrythmias:Atrial Fibrillation; Risk                 Factors:Dyslipidemia. Lymphoma.  Sonographer:    Dustin Flock Referring Phys: [id number] Pope  1. Left ventricular ejection fraction, by estimation, is 60 to 65%. The left  ventricle has normal function. The left ventricle has no regional wall motion abnormalities. Left ventricular diastolic parameters are consistent with Grade I diastolic dysfunction (impaired relaxation).  2. Right ventricular systolic function is normal. The right ventricular size is normal. There is normal pulmonary artery systolic pressure.  3. The mitral valve is grossly normal. Trivial mitral valve regurgitation.  4. The aortic valve is tricuspid. Aortic valve regurgitation is not visualized.  5. The inferior vena cava is normal in size with greater than 50% respiratory variability, suggesting right atrial pressure of 3 mmHg. Comparison(s): No significant change from prior study. FINDINGS  Left Ventricle: Left ventricular ejection fraction, by estimation, is 60 to 65%. The left ventricle has normal function. The left ventricle has no regional wall motion abnormalities. Global longitudinal strain performed but not reported based on interpreter judgement due to suboptimal tracking. The left ventricular internal cavity size was normal in size. There is no left ventricular hypertrophy. Left ventricular diastolic parameters are consistent with Grade I diastolic dysfunction (impaired relaxation). Right Ventricle: The right ventricular size is normal. No increase in right ventricular wall thickness. Right ventricular systolic function is normal. There is normal pulmonary artery systolic pressure. The tricuspid regurgitant velocity is 2.59 m/s, and  with an assumed right atrial pressure of  3 mmHg, the estimated right ventricular systolic pressure is 99.8 mmHg. Left Atrium: Left atrial size was normal in size. Right Atrium: Right atrial size was normal in size. Pericardium: There is no evidence of pericardial effusion. Mitral Valve: The mitral valve is grossly normal. Trivial mitral valve regurgitation. Tricuspid Valve: The tricuspid valve is not well visualized. Tricuspid valve regurgitation is trivial. Aortic Valve:  The aortic valve is tricuspid. Aortic valve regurgitation is not visualized. Pulmonic Valve: The pulmonic valve was grossly normal. Pulmonic valve regurgitation is trivial. Aorta: The aortic root is normal in size and structure and the ascending aorta was not well visualized. Venous: The pulmonary veins were not well visualized. The inferior vena cava is normal in size with greater than 50% respiratory variability, suggesting right atrial pressure of 3 mmHg. IAS/Shunts: The atrial septum is grossly normal.  LEFT VENTRICLE PLAX 2D LVIDd:         4.20 cm  Diastology LVIDs:         2.60 cm  LV e' medial:    4.90 cm/s LV PW:         1.10 cm  LV E/e' medial:  14.2 LV IVS:        0.90 cm  LV e' lateral:   4.68 cm/s LVOT diam:     1.90 cm  LV E/e' lateral: 14.9 LV SV:         61 LV SV Index:   37 LVOT Area:     2.84 cm  RIGHT VENTRICLE RV Basal diam:  2.70 cm RV S prime:     5.22 cm/s TAPSE (M-mode): 2.4 cm LEFT ATRIUM             Index       RIGHT ATRIUM           Index LA diam:        3.60 cm 2.17 cm/m  RA Area:     10.40 cm LA Vol (A2C):   36.4 ml 21.98 ml/m RA Volume:   20.50 ml  12.38 ml/m LA Vol (A4C):   36.6 ml 22.10 ml/m LA Biplane Vol: 36.7 ml 22.16 ml/m  AORTIC VALVE LVOT Vmax:   92.00 cm/s LVOT Vmean:  70.100 cm/s LVOT VTI:    0.214 m  AORTA Ao Root diam: 2.60 cm MITRAL VALVE               TRICUSPID VALVE MV Area (PHT): 3.77 cm    TR Peak grad:   26.8 mmHg MV Decel Time: 201 msec    TR Vmax:        259.00 cm/s MV E velocity: 69.80 cm/s MV A velocity: 72.80 cm/s  SHUNTS MV E/A ratio:  0.96        Systemic VTI:  0.21 m                            Systemic Diam: 1.90 cm Rudean Haskell MD Electronically signed by Rudean Haskell MD Signature Date/Time: 10/27/2019/4:15:30 PM    Final     Labs:  CBC: Recent Labs    02/11/19 0823 10/08/19 1509 10/29/19 1538  WBC 6.9 11.5* 9.1  HGB 12.2 14.0 12.6  HCT 37.4 43.2 38.9  PLT 301 301 319    COAGS: No results for input(s): INR, APTT in the  last 8760 hours.  BMP: Recent Labs    02/11/19 0823 10/08/19 1509 10/29/19 1538  NA 134* 138 139  K  3.5 3.7 4.0  CL 100 98 101  CO2 26 31 34*  GLUCOSE 98 114* 104*  BUN 6* 7* 8  CALCIUM 9.0 9.8 9.5  CREATININE 0.68 0.78 0.82  GFRNONAA >60 >60 >60  GFRAA >60 >60  --     LIVER FUNCTION TESTS: Recent Labs    02/11/19 0823 10/08/19 1509 10/29/19 1538  BILITOT 1.2 0.7 0.7  AST 26 26 27   ALT 14 20 16   ALKPHOS 81 130* 114  PROT 7.8 8.7* 7.9  ALBUMIN 3.9 3.8 3.6    TUMOR MARKERS: No results for input(s): AFPTM, CEA, CA199, CHROMGRNA in the last 8760 hours.  Assessment and Plan:  Large B Cell Lymphoma: Amy [name], 84 year old female, presents today to the [place] Radiology department for an image-guided port-a-catheter placement.  Risks and benefits of an image-guided port-a-catheter placement were discussed with the patient including, but not limited to bleeding, infection, pneumothorax, or fibrin sheath development and need for additional procedures.  All of the patient's questions were answered, patient is agreeable to proceed.  She has been NPO. Vitals have been reviewed.   Consent signed and in chart.  Thank you for this interesting consult.  I greatly enjoyed meeting Amy [name] and look forward to participating in their care.  A copy of this report was sent to the requesting provider on this date.  Electronically Signed: Soyla Dryer, AGACNP-BC [phone] 11/05/2019, 12:16 PM   I spent a total of  30 Minutes   in face to face in clinical consultation, greater than 50% of which was counseling/coordinating care for port-a-catheter placement.

## 2019-11-05 NOTE — Procedures (Signed)
Interventional Radiology Procedure Note  Procedure: Placement of a right IJ approach single lumen PowerPort.  Tip is positioned at the superior cavoatrial junction and catheter is ready for immediate use.  Complications: No immediate Recommendations:  - Ok to shower tomorrow - Do not submerge for 7 days - Routine line care   Signed,  Shenise Wolgamott K. Ladona Rosten, MD   

## 2019-11-06 NOTE — Progress Notes (Signed)
Fulphila orders discontinued and changed to Udenyca per preferred agent per her insurance.  Hardie Pulley, PharmD, BCPS, BCOP

## 2019-11-10 ENCOUNTER — Inpatient Hospital Stay: Payer: Medicare Other | Attending: Hematology and Oncology

## 2019-11-10 ENCOUNTER — Other Ambulatory Visit: Payer: Self-pay

## 2019-11-10 DIAGNOSIS — Z5111 Encounter for antineoplastic chemotherapy: Secondary | ICD-10-CM | POA: Insufficient documentation

## 2019-11-10 DIAGNOSIS — Z833 Family history of diabetes mellitus: Secondary | ICD-10-CM | POA: Insufficient documentation

## 2019-11-10 DIAGNOSIS — K219 Gastro-esophageal reflux disease without esophagitis: Secondary | ICD-10-CM | POA: Insufficient documentation

## 2019-11-10 DIAGNOSIS — E039 Hypothyroidism, unspecified: Secondary | ICD-10-CM | POA: Insufficient documentation

## 2019-11-10 DIAGNOSIS — Z8249 Family history of ischemic heart disease and other diseases of the circulatory system: Secondary | ICD-10-CM | POA: Insufficient documentation

## 2019-11-10 DIAGNOSIS — J449 Chronic obstructive pulmonary disease, unspecified: Secondary | ICD-10-CM | POA: Insufficient documentation

## 2019-11-10 DIAGNOSIS — Z90710 Acquired absence of both cervix and uterus: Secondary | ICD-10-CM | POA: Insufficient documentation

## 2019-11-10 DIAGNOSIS — Z87891 Personal history of nicotine dependence: Secondary | ICD-10-CM | POA: Insufficient documentation

## 2019-11-10 DIAGNOSIS — C8331 Diffuse large B-cell lymphoma, lymph nodes of head, face, and neck: Secondary | ICD-10-CM | POA: Insufficient documentation

## 2019-11-10 DIAGNOSIS — I4891 Unspecified atrial fibrillation: Secondary | ICD-10-CM | POA: Insufficient documentation

## 2019-11-10 DIAGNOSIS — Z5112 Encounter for antineoplastic immunotherapy: Secondary | ICD-10-CM | POA: Insufficient documentation

## 2019-11-10 DIAGNOSIS — E785 Hyperlipidemia, unspecified: Secondary | ICD-10-CM | POA: Insufficient documentation

## 2019-11-10 DIAGNOSIS — Z5189 Encounter for other specified aftercare: Secondary | ICD-10-CM | POA: Insufficient documentation

## 2019-11-10 DIAGNOSIS — M810 Age-related osteoporosis without current pathological fracture: Secondary | ICD-10-CM | POA: Insufficient documentation

## 2019-11-10 DIAGNOSIS — Z79899 Other long term (current) drug therapy: Secondary | ICD-10-CM | POA: Insufficient documentation

## 2019-11-10 DIAGNOSIS — Z7982 Long term (current) use of aspirin: Secondary | ICD-10-CM | POA: Insufficient documentation

## 2019-11-10 NOTE — Progress Notes (Signed)
Pharmacist Chemotherapy Monitoring - Initial Assessment    Anticipated start date: 11/13/2019  Regimen:  . Are orders appropriate based on the patient's diagnosis, regimen, and cycle? Yes  o Do not see prednisone ordered on patient's medication list/fill history, will reach out to provider on prescribing prednisone as part of R-mini-[facility] plan.  . Does the plan date match the patient's scheduled date? Yes . Is the sequencing of drugs appropriate? Yes . Are the premedications appropriate for the patient's regimen? Yes . Prior Authorization for treatment is: Approved o If applicable, is the correct biosimilar selected based on the patient's insurance? Yes  Organ Function and Labs: Marland Kitchen Are dose adjustments needed based on the patient's renal function, hepatic function, or hematologic function? No . Are appropriate labs ordered prior to the start of patient's treatment? Yes . Other organ system assessment, if indicated: anthracyclines: Echo/ MUGA . The following baseline labs, if indicated, have been ordered: rituximab: baseline Hepatitis B labs  Dose Assessment: . Are the drug doses appropriate? Yes . Are the following correct: o Drug concentrations Yes o IV fluid compatible with drug Yes o Administration routes Yes o Timing of therapy Yes . If applicable, does the patient have documented access for treatment and/or plans for port-a-cath placement? yes . If applicable, have lifetime cumulative doses been properly documented and assessed? yes Lifetime Dose Tracking  No doses have been documented on this patient for the following tracked chemicals: Doxorubicin, Epirubicin, Idarubicin, Daunorubicin, Mitoxantrone, Bleomycin, Oxaliplatin, Carboplatin, Liposomal Doxorubicin  o   Toxicity Monitoring/Prevention: . The patient has the following take home antiemetics prescribed: Ondansetron and Prochlorperazine . The patient has the following take home medications prescribed: Tumor Lysis Syndrome  prophylaxis . Medication allergies and previous infusion related reactions, if applicable, have been reviewed and addressed. Yes . The patient's current medication list has been assessed for drug-drug interactions with their chemotherapy regimen. no significant drug-drug interactions were identified on review.  Order Review: . Are the treatment plan orders signed? Yes . Is the patient scheduled to see a provider prior to their treatment? Yes  I verify that I have reviewed each item in the above checklist and answered each question accordingly.  Eddie Candle, PharmD PGY-2 Hematology/Oncology Pharmacy Resident  11/10/2019 8:19 AM

## 2019-11-11 ENCOUNTER — Telehealth: Payer: Self-pay | Admitting: *Deleted

## 2019-11-13 ENCOUNTER — Other Ambulatory Visit: Payer: Self-pay | Admitting: Hematology and Oncology

## 2019-11-13 ENCOUNTER — Inpatient Hospital Stay: Payer: Medicare Other

## 2019-11-13 ENCOUNTER — Other Ambulatory Visit: Payer: Self-pay

## 2019-11-13 ENCOUNTER — Inpatient Hospital Stay: Payer: Medicare Other | Admitting: Hematology and Oncology

## 2019-11-13 VITALS — BP 127/64 | HR 77 | Temp 97.7°F | Resp 18 | Wt 134.5 lb

## 2019-11-13 DIAGNOSIS — K219 Gastro-esophageal reflux disease without esophagitis: Secondary | ICD-10-CM | POA: Diagnosis not present

## 2019-11-13 DIAGNOSIS — I4891 Unspecified atrial fibrillation: Secondary | ICD-10-CM | POA: Diagnosis not present

## 2019-11-13 DIAGNOSIS — Z87891 Personal history of nicotine dependence: Secondary | ICD-10-CM | POA: Diagnosis not present

## 2019-11-13 DIAGNOSIS — I480 Paroxysmal atrial fibrillation: Secondary | ICD-10-CM

## 2019-11-13 DIAGNOSIS — C8331 Diffuse large B-cell lymphoma, lymph nodes of head, face, and neck: Secondary | ICD-10-CM

## 2019-11-13 DIAGNOSIS — Z8249 Family history of ischemic heart disease and other diseases of the circulatory system: Secondary | ICD-10-CM | POA: Diagnosis not present

## 2019-11-13 DIAGNOSIS — Z79899 Other long term (current) drug therapy: Secondary | ICD-10-CM | POA: Diagnosis not present

## 2019-11-13 DIAGNOSIS — Z5189 Encounter for other specified aftercare: Secondary | ICD-10-CM | POA: Diagnosis not present

## 2019-11-13 DIAGNOSIS — E785 Hyperlipidemia, unspecified: Secondary | ICD-10-CM | POA: Diagnosis not present

## 2019-11-13 DIAGNOSIS — E039 Hypothyroidism, unspecified: Secondary | ICD-10-CM | POA: Diagnosis not present

## 2019-11-13 DIAGNOSIS — M810 Age-related osteoporosis without current pathological fracture: Secondary | ICD-10-CM | POA: Diagnosis not present

## 2019-11-13 DIAGNOSIS — Z7982 Long term (current) use of aspirin: Secondary | ICD-10-CM | POA: Diagnosis not present

## 2019-11-13 DIAGNOSIS — Z90710 Acquired absence of both cervix and uterus: Secondary | ICD-10-CM | POA: Diagnosis not present

## 2019-11-13 DIAGNOSIS — Z833 Family history of diabetes mellitus: Secondary | ICD-10-CM | POA: Diagnosis not present

## 2019-11-13 DIAGNOSIS — Z5111 Encounter for antineoplastic chemotherapy: Secondary | ICD-10-CM | POA: Diagnosis present

## 2019-11-13 DIAGNOSIS — J449 Chronic obstructive pulmonary disease, unspecified: Secondary | ICD-10-CM | POA: Diagnosis not present

## 2019-11-13 DIAGNOSIS — Z5112 Encounter for antineoplastic immunotherapy: Secondary | ICD-10-CM | POA: Diagnosis present

## 2019-11-13 LAB — CMP (CANCER CENTER ONLY)
ALT: 11 U/L (ref 0–44)
AST: 24 U/L (ref 15–41)
Albumin: 3.3 g/dL — ABNORMAL LOW (ref 3.5–5.0)
Alkaline Phosphatase: 120 U/L (ref 38–126)
Anion gap: 3 — ABNORMAL LOW (ref 5–15)
BUN: 5 mg/dL — ABNORMAL LOW (ref 8–23)
CO2: 34 mmol/L — ABNORMAL HIGH (ref 22–32)
Calcium: 8.6 mg/dL — ABNORMAL LOW (ref 8.9–10.3)
Chloride: 101 mmol/L (ref 98–111)
Creatinine: 0.68 mg/dL (ref 0.44–1.00)
GFR, Estimated: >60 mL/min (ref >60)
Glucose, Bld: 108 mg/dL — ABNORMAL HIGH (ref 70–99)
Potassium: 3.9 mmol/L (ref 3.5–5.1)
Sodium: 138 mmol/L (ref 135–145)
Total Bilirubin: 0.8 mg/dL (ref 0.3–1.2)
Total Protein: 7.2 g/dL (ref 6.5–8.1)

## 2019-11-13 LAB — CBC WITH DIFFERENTIAL (CANCER CENTER ONLY)
Abs Immature Granulocytes: 0.01 10*3/uL (ref 0.00–0.07)
Basophils Absolute: 0 10*3/uL (ref 0.0–0.1)
Basophils Relative: 0 %
Eosinophils Absolute: 0.3 10*3/uL (ref 0.0–0.5)
Eosinophils Relative: 3 %
HCT: 35.9 % — ABNORMAL LOW (ref 36.0–46.0)
Hemoglobin: 11.3 g/dL — ABNORMAL LOW (ref 12.0–15.0)
Immature Granulocytes: 0 %
Lymphocytes Relative: 38 %
Lymphs Abs: 3.9 10*3/uL (ref 0.7–4.0)
MCH: 30.5 pg (ref 26.0–34.0)
MCHC: 31.5 g/dL (ref 30.0–36.0)
MCV: 97 fL (ref 80.0–100.0)
Monocytes Absolute: 0.7 10*3/uL (ref 0.1–1.0)
Monocytes Relative: 6 %
Neutro Abs: 5.4 10*3/uL (ref 1.7–7.7)
Neutrophils Relative %: 53 %
Platelet Count: 228 10*3/uL (ref 150–400)
RBC: 3.7 MIL/uL — ABNORMAL LOW (ref 3.87–5.11)
RDW: 13.7 % (ref 11.5–15.5)
WBC Count: 10.4 10*3/uL (ref 4.0–10.5)
nRBC: 0 % (ref 0.0–0.2)

## 2019-11-13 LAB — LACTATE DEHYDROGENASE: LDH: 156 U/L (ref 98–192)

## 2019-11-13 MED ORDER — SODIUM CHLORIDE 0.9 % IV SOLN
400.0000 mg/m2 | Freq: Once | INTRAVENOUS | Status: AC
  Administered 2019-11-13: 660 mg via INTRAVENOUS
  Filled 2019-11-13: qty 33

## 2019-11-13 MED ORDER — SODIUM CHLORIDE 0.9 % IV SOLN
Freq: Once | INTRAVENOUS | Status: AC
  Filled 2019-11-13: qty 250

## 2019-11-13 MED ORDER — ACETAMINOPHEN 325 MG PO TABS
650.0000 mg | ORAL_TABLET | Freq: Once | ORAL | Status: AC
  Administered 2019-11-13: 650 mg via ORAL

## 2019-11-13 MED ORDER — DIPHENHYDRAMINE HCL 25 MG PO CAPS
ORAL_CAPSULE | ORAL | Status: AC
  Filled 2019-11-13: qty 2

## 2019-11-13 MED ORDER — PALONOSETRON HCL INJECTION 0.25 MG/5ML
0.2500 mg | Freq: Once | INTRAVENOUS | Status: AC
  Administered 2019-11-13: 0.25 mg via INTRAVENOUS

## 2019-11-13 MED ORDER — HEPARIN SOD (PORK) LOCK FLUSH 100 UNIT/ML IV SOLN
500.0000 [IU] | Freq: Once | INTRAVENOUS | Status: AC | PRN
  Administered 2019-11-13: 500 [IU]
  Filled 2019-11-13: qty 5

## 2019-11-13 MED ORDER — PREDNISONE 5 MG PO TABS: 60.0000 mg | ORAL_TABLET | Freq: Every day | ORAL | 2 refills | Status: DC

## 2019-11-13 MED ORDER — DOXORUBICIN HCL CHEMO IV INJECTION 2 MG/ML
25.0000 mg/m2 | Freq: Once | INTRAVENOUS | Status: AC
  Administered 2019-11-13: 42 mg via INTRAVENOUS
  Filled 2019-11-13: qty 21

## 2019-11-13 MED ORDER — SODIUM CHLORIDE 0.9 % IV SOLN
375.0000 mg/m2 | Freq: Once | INTRAVENOUS | Status: AC
  Administered 2019-11-13: 600 mg via INTRAVENOUS
  Filled 2019-11-13: qty 10

## 2019-11-13 MED ORDER — SODIUM CHLORIDE 0.9 % IV SOLN
10.0000 mg | Freq: Once | INTRAVENOUS | Status: AC
  Administered 2019-11-13: 10 mg via INTRAVENOUS
  Filled 2019-11-13: qty 10

## 2019-11-13 MED ORDER — DIPHENHYDRAMINE HCL 25 MG PO CAPS
50.0000 mg | ORAL_CAPSULE | Freq: Once | ORAL | Status: AC
  Administered 2019-11-13: 50 mg via ORAL

## 2019-11-13 MED ORDER — SODIUM CHLORIDE 0.9% FLUSH
10.0000 mL | INTRAVENOUS | Status: DC | PRN
  Administered 2019-11-13: 10 mL
  Filled 2019-11-13: qty 10

## 2019-11-13 MED ORDER — ACETAMINOPHEN 325 MG PO TABS
ORAL_TABLET | ORAL | Status: AC
  Filled 2019-11-13: qty 2

## 2019-11-13 MED ORDER — VINCRISTINE SULFATE CHEMO INJECTION 1 MG/ML
1.0000 mg | Freq: Once | INTRAVENOUS | Status: AC
  Administered 2019-11-13: 1 mg via INTRAVENOUS
  Filled 2019-11-13: qty 1

## 2019-11-13 MED ORDER — PALONOSETRON HCL INJECTION 0.25 MG/5ML
INTRAVENOUS | Status: AC
  Filled 2019-11-13: qty 5

## 2019-11-13 MED ORDER — SODIUM CHLORIDE 0.9 % IV SOLN
150.0000 mg | Freq: Once | INTRAVENOUS | Status: AC
  Administered 2019-11-13: 150 mg via INTRAVENOUS
  Filled 2019-11-13: qty 150

## 2019-11-13 NOTE — Progress Notes (Signed)
Blood return noted before, during, and after Doxorubicin IV injection. 

## 2019-11-13 NOTE — Progress Notes (Signed)
[place] [phone]   [phone]  PROGRESS NOTE  Patient Care Team: Amie Critchley as PCP - General (Family Medicine)  Hematological/Oncological History # Diffuse Large B Cell Lymphoma, Stage II 1) 09/26/2019: CT scan of neck performed due to persistent tonsillar lesion after outpatient antibiotic course. CT scan showed a heterogeneous right tonsillar mass up to 3.2 cm and a small but suspicious right level 2A lymph node  2) 09/30/2019: patient underwent biopsy with ENT which revealed a DLBCL with FISH for MYC, BCL2 and BCL6 pending 3) 10/08/2019: establish care with Dr. Lorenso Courier  4) 10/21/2019: PET scan showed hypermetabolic right tonsillar mass and adjacent right level 2 adenopathy, consistent with the given history of lymphoma. 5) 11/13/2019; Cycle 1 Day 1 of R-miniCHOP  Interval History:  Amy [name] 84 y.o. female with medical history significant for Stage II DLBCL of the tonsil who presents for a follow up visit. The patient's last visit was on 10/29/2019. In the interim since the last visit she had a port placed and has completed chemotherapy education. She is currently Cycle 1 Day 1 of R-miniCHOP.  On exam today Amy [name] notes that she feels well.  She denies having any issues with fevers, chills, sweats, nausea, vomiting or diarrhea.  She notes that she has been eating well and that she has not had any trouble with solid foods.  She denies having any questions or concerns prior to the start of cycle 1 day 1 of R mini CHOP today.  A full 10 point ROS is listed below.  MEDICAL HISTORY:  Past Medical History:  Diagnosis Date   Anxiety    Arthritis    Atrial fibrillation (HCC)    COPD (chronic obstructive pulmonary disease) (HCC)    Esophageal stricture    s/p repeated dilations, Dr. Watt Climes   GERD (gastroesophageal reflux disease)    Hearing loss bilateral   has hearing aids   Heart murmur    Hiatal hernia    History of  blood transfusion    many years ago    Hyperlipidemia    Hypothyroidism    Osteoarthritis    Osteoporosis    Pulmonary nodules    Renal lesion    1cm left kidney    SURGICAL HISTORY: Past Surgical History:  Procedure Laterality Date   ABDOMINAL HYSTERECTOMY     BREAST ENHANCEMENT SURGERY  2006   CATARACT EXTRACTION W/ INTRAOCULAR LENS  IMPLANT, BILATERAL  '09   ESOPHAGEAL DILATION     IR IMAGING GUIDED PORT INSERTION  11/05/2019   KNEE ARTHROSCOPY  02/22/2011   Procedure: ARTHROSCOPY KNEE;  Surgeon: Gearlean Alf, MD;  Location: [facility];  Service: Orthopedics;  Laterality: Right;  WITH DEBRIDEMENT    LAPAROSCOPIC SMALL BOWEL RESECTION N/A 06/02/2016   Procedure: DIAGNOSTIC LAPAROSCOPY SMALL BOWEL RESECTION;  Surgeon: Armandina Gemma, MD;  Location: [facility];  Service: General;  Laterality: N/A;    SOCIAL HISTORY: Social History   Socioeconomic History   Marital status: Widowed    Spouse name: Not on file   Number of children: 3   Years of education: 62   Highest education level: Not on file  Occupational History   Not on file  Tobacco Use   Smoking status: Former Smoker    Packs/day: 1.00    Years: 30.00    Pack years: 30.00    Types: Cigarettes    Quit date: 01/09/1997    Years since quitting: 22.8  Smokeless tobacco: Never Used  Vaping Use   Vaping Use: Never used  Substance and Sexual Activity   Alcohol use: No   Drug use: No   Sexual activity: Not on file  Other Topics Concern   Not on file  Social History Narrative   Right handed    Live alone   Caffeine use: 1 cup coffee every morning   Social Determinants of Health   Financial Resource Strain:    Difficulty of Paying Living Expenses: Not on file  Food Insecurity:    Worried About Running Out of Food in the Last Year: Not on file   Ran Out of Food in the Last Year: Not on file  Transportation Needs:    Lack of Transportation (Medical): Not on file    Lack of Transportation (Non-Medical): Not on file  Physical Activity:    Days of Exercise per Week: Not on file   Minutes of Exercise per Session: Not on file  Stress:    Feeling of Stress : Not on file  Social Connections:    Frequency of Communication with Friends and Family: Not on file   Frequency of Social Gatherings with Friends and Family: Not on file   Attends Religious Services: Not on file   Active Member of Clubs or Organizations: Not on file   Attends Archivist Meetings: Not on file   Marital Status: Not on file  Intimate Partner Violence:    Fear of Current or Ex-Partner: Not on file   Emotionally Abused: Not on file   Physically Abused: Not on file   Sexually Abused: Not on file    FAMILY HISTORY: Family History  Problem Relation Age of Onset   Heart disease Father    Rheum arthritis Father    Heart failure Mother    Diabetes Brother     ALLERGIES:  is allergic to other and codeine.  MEDICATIONS:  Current Outpatient Medications  Medication Sig Dispense Refill   allopurinol (ZYLOPRIM) 300 MG tablet Take 1 tablet (300 mg total) by mouth daily. 90 tablet 1   ALPRAZolam (XANAX) 0.25 MG tablet Take 0.25 mg by mouth 2 (two) times daily.      amLODipine (NORVASC) 5 MG tablet Take 5 mg by mouth daily.     aspirin EC 81 MG tablet Take 81 mg by mouth at bedtime.     atorvastatin (LIPITOR) 20 MG tablet Take 20 mg by mouth at bedtime.  11   budesonide-formoterol (SYMBICORT) 160-4.5 MCG/ACT inhaler Inhale 2 puffs into the lungs 2 (two) times daily as needed (for shortness of breath).      Cholecalciferol (VITAMIN D3) 2000 UNITS TABS Take 2,000 Units by mouth at bedtime.      fluticasone (FLONASE) 50 MCG/ACT nasal spray Place 1-2 sprays into both nostrils daily as needed for rhinitis.   11   gabapentin (NEURONTIN) 100 MG capsule TAKE 1 CAP EVERY MORNING, 1 CAP IN THE AFTERNOON, AND 2 CAPS EVERY DAY AT BEDTIME (Patient taking differently:  Take 100-200 mg by mouth See admin instructions. Take 100mg  by mouth every morning, take 100mg  by mouth in the afternoon, and take 200mg  by mouth at bedtime) 360 capsule 2   hydroxypropyl methylcellulose / hypromellose (ISOPTO TEARS / GONIOVISC) 2.5 % ophthalmic solution Place 1-2 drops into both eyes 3 (three) times daily as needed (to soothe dry eyes.).      levothyroxine (SYNTHROID, LEVOTHROID) 75 MCG tablet Take 1 tablet (75 mcg total) by mouth daily before breakfast.  30 tablet 1   lidocaine-prilocaine (EMLA) cream Apply 1 application topically as needed. 30 g 0   metoprolol tartrate (LOPRESSOR) 25 MG tablet Take 0.5 tablets (12.5 mg total) by mouth 2 (two) times daily. (Patient taking differently: Take 12.5 mg by mouth daily. ) 60 tablet 1   mirtazapine (REMERON) 15 MG tablet Take 15 mg by mouth at bedtime.      ondansetron (ZOFRAN ODT) 4 MG disintegrating tablet Take 1 tablet (4 mg total) by mouth every 8 (eight) hours as needed for nausea or vomiting. 20 tablet 0   ondansetron (ZOFRAN) 8 MG tablet Take 1 tablet (8 mg total) by mouth every 8 (eight) hours as needed for nausea or vomiting. 30 tablet 0   pantoprazole (PROTONIX) 40 MG tablet Take 40 mg by mouth 2 (two) times daily.   11   Potassium 99 MG TABS Take 99 mg by mouth at bedtime.     prochlorperazine (COMPAZINE) 10 MG tablet Take 1 tablet (10 mg total) by mouth every 6 (six) hours as needed for nausea or vomiting. 30 tablet 0   traMADol (ULTRAM) 50 MG tablet Take 50 mg by mouth 2 (two) times daily as needed for moderate pain.      vitamin B-12 (CYANOCOBALAMIN) 1000 MCG tablet Take 1,000 mcg by mouth daily.     No current facility-administered medications for this visit.   Facility-Administered Medications Ordered in Other Visits  Medication Dose Route Frequency Provider Last Rate Last Admin   cyclophosphamide (CYTOXAN) 660 mg in sodium chloride 0.9 % 250 mL chemo infusion  400 mg/m2 (Treatment Plan Recorded) Intravenous  Once Orson Slick, MD       dexamethasone (DECADRON) 10 mg in sodium chloride 0.9 % 50 mL IVPB  10 mg Intravenous Once Orson Slick, MD       DOXOrubicin (ADRIAMYCIN) chemo injection 42 mg  25 mg/m2 (Treatment Plan Recorded) Intravenous Once Orson Slick, MD       fosaprepitant (EMEND) 150 mg in sodium chloride 0.9 % 145 mL IVPB  150 mg Intravenous Once Ledell Peoples IV, MD       heparin lock flush 100 unit/mL  500 Units Intracatheter Once PRN Orson Slick, MD       riTUXimab-pvvr (RUXIENCE) 600 mg in sodium chloride 0.9 % 250 mL (1.9355 mg/mL) infusion  375 mg/m2 (Treatment Plan Recorded) Intravenous Once Orson Slick, MD       sodium chloride flush (NS) 0.9 % injection 10 mL  10 mL Intracatheter PRN Orson Slick, MD       vinCRIStine (ONCOVIN) 1 mg in sodium chloride 0.9 % 50 mL chemo infusion  1 mg Intravenous Once Orson Slick, MD        REVIEW OF SYSTEMS:   Constitutional: ( - ) fevers, ( - )  chills , ( - ) night sweats Eyes: ( - ) blurriness of vision, ( - ) double vision, ( - ) watery eyes Ears, nose, mouth, throat, and face: ( - ) mucositis, ( - ) sore throat Respiratory: ( - ) cough, ( - ) dyspnea, ( - ) wheezes Cardiovascular: ( - ) palpitation, ( - ) chest discomfort, ( - ) lower extremity swelling Gastrointestinal:  ( - ) nausea, ( - ) heartburn, ( - ) change in bowel habits Skin: ( - ) abnormal skin rashes Lymphatics: ( - ) new lymphadenopathy, ( - ) easy bruising Neurological: ( - )  numbness, ( - ) tingling, ( - ) new weaknesses Behavioral/Psych: ( - ) mood change, ( - ) new changes  All other systems were reviewed with the patient and are negative.  PHYSICAL EXAMINATION: ECOG PERFORMANCE STATUS: 2 - Symptomatic, <50% confined to bed  There were no vitals filed for this visit. There were no vitals filed for this visit.  GENERAL: well appearing elderly Caucasian female in NAD  SKIN: skin color, texture, turgor are normal, no  rashes or significant lesions EYES: conjunctiva are pink and non-injected, sclera clear OROPHARYNX: large tonsillar lesion abutting the uvula. Overlying erythema but no discharge. no exudate, no erythema; lips, buccal mucosa, and tongue normal  NECK: supple, non-tender LYMPH:  no palpable lymphadenopathy in the cervical, axillary or supraclavicular lymph nodes.  LUNGS: clear to auscultation and percussion with normal breathing effort HEART: regular rate & rhythm and no murmurs and no lower extremity edema Musculoskeletal: no cyanosis of digits and no clubbing  PSYCH: alert & oriented x 3, fluent speech NEURO: no focal motor/sensory deficits  LABORATORY DATA:  I have reviewed the data as listed CBC Latest Ref Rng & Units 10/29/2019 10/08/2019 02/11/2019  WBC 4.0 - 10.5 K/uL 9.1 11.5(H) 6.9  Hemoglobin 12.0 - 15.0 g/dL 12.6 14.0 12.2  Hematocrit 36 - 46 % 38.9 43.2 37.4  Platelets 150 - 400 K/uL 319 301 301    CMP Latest Ref Rng & Units 10/29/2019 10/08/2019 02/11/2019  Glucose 70 - 99 mg/dL 104(H) 114(H) 98  BUN 8 - 23 mg/dL 8 7(L) 6(L)  Creatinine 0.44 - 1.00 mg/dL 0.82 0.78 0.68  Sodium 135 - 145 mmol/L 139 138 134(L)  Potassium 3.5 - 5.1 mmol/L 4.0 3.7 3.5  Chloride 98 - 111 mmol/L 101 98 100  CO2 22 - 32 mmol/L 34(H) 31 26  Calcium 8.9 - 10.3 mg/dL 9.5 9.8 9.0  Total Protein 6.5 - 8.1 g/dL 7.9 8.7(H) 7.8  Total Bilirubin 0.3 - 1.2 mg/dL 0.7 0.7 1.2  Alkaline Phos 38 - 126 U/L 114 130(H) 81  AST 15 - 41 U/L 27 26 26   ALT 0 - 44 U/L 16 20 14    RADIOGRAPHIC STUDIES: I have personally reviewed the radiological images as listed and agreed with the findings in the report: hypermetabolic right tonsillar mass.   NM PET Image Initial (PI) Skull Base To Thigh  Result Date: 10/22/2019 CLINICAL DATA:  Initial treatment strategy for large B-cell lymphoma. EXAM: NUCLEAR MEDICINE PET SKULL BASE TO THIGH TECHNIQUE: 6.3 mCi F-18 FDG was injected intravenously. Full-ring PET imaging was performed  from the skull base to thigh after the radiotracer. CT data was obtained and used for attenuation correction and anatomic localization. Fasting blood glucose: 107 mg/dl COMPARISON:  CT neck 09/26/2019 and CT abdomen pelvis 02/03/2017. FINDINGS: Mediastinal blood pool activity: SUV max 3.25 Liver activity: SUV max 4.6 NECK: Asymmetric hypermetabolic right tonsillar mass, SUV max 23.5. Mass was better on 09/26/2019. Adjacent right level 2 11 mm lymph node with an SUV max of 24.3. 5 mm left level 2 lymph node (4/32) with an SUV max of 3.3. Incidental CT findings: None. CHEST: Hypermetabolic left subpectoral/left axillary lymph nodes. Index lateral left subpectoral lymph node measures 7 mm (4/59) with an SUV of 3.9. No hypermetabolic mediastinal, hilar or right axillary lymph nodes. No hypermetabolic pulmonary nodules. Incidental CT findings: Atherosclerotic calcification of the aorta and coronary arteries. Heart is enlarged. No pericardial or pleural effusion. Biapical pleuroparenchymal scarring. ABDOMEN/PELVIS: No abnormal hypermetabolism in the liver, adrenal glands,  spleen or pancreas. Hypermetabolic external iliac lymph nodes measure up to 7 mm on the right (4/156) with an SUV max of 2.9. Metabolic inguinal lymph nodes measure up to 9 mm on the right (4/165) with an SUV max 3.6. No additional hypermetabolic lymph nodes. Incidental CT findings: Liver, adrenal glands, kidneys, spleen, pancreas and stomach are grossly unremarkable. Specifically, spleen is normal in size. Postoperative changes in the small bowel. No free fluid. Atherosclerotic calcification of the aorta without aneurysm. SKELETON: No abnormal hypermetabolism. Incidental CT findings: Degenerative changes in the spine. IMPRESSION: 1. Hypermetabolic right tonsillar mass and adjacent right level 2 adenopathy, consistent with the given history of lymphoma. Additional mildly hypermetabolic lymph nodes in the left neck, left axilla, pelvic retroperitoneum and  inguinal regions. 2. Normal spleen size. 3. Aortic atherosclerosis (ICD10-I70.0). Coronary artery calcification. Electronically Signed   By: Lorin Picket M.D.   On: 10/22/2019 09:08   ECHOCARDIOGRAM COMPLETE  Result Date: 10/27/2019    ECHOCARDIOGRAM REPORT   Patient Name:   Amy [name] Date of Exam: 10/27/2019 Medical Rec #:  [id number]    Height:       65.0 in Accession #:    [id number]   Weight:       131.7 lb Date of Birth:  [birth year]    BSA:          1.656 m Patient Age:    44 years     BP:           150/77 mmHg Patient Gender: F            HR:           79 bpm. Exam Location:  Outpatient Procedure: 2D Echo, Cardiac Doppler and Color Doppler Indications:    Chemo evaluation  History:        Patient has prior history of Echocardiogram examinations, most                 recent 07/29/2018. COPD, Arrythmias:Atrial Fibrillation; Risk                 Factors:Dyslipidemia. Lymphoma.  Sonographer:    Dustin Flock Referring Phys: [id number] [place]  1. Left ventricular ejection fraction, by estimation, is 60 to 65%. The left ventricle has normal function. The left ventricle has no regional wall motion abnormalities. Left ventricular diastolic parameters are consistent with Grade I diastolic dysfunction (impaired relaxation).  2. Right ventricular systolic function is normal. The right ventricular size is normal. There is normal pulmonary artery systolic pressure.  3. The mitral valve is grossly normal. Trivial mitral valve regurgitation.  4. The aortic valve is tricuspid. Aortic valve regurgitation is not visualized.  5. The inferior vena cava is normal in size with greater than 50% respiratory variability, suggesting right atrial pressure of 3 mmHg. Comparison(s): No significant change from prior study. FINDINGS  Left Ventricle: Left ventricular ejection fraction, by estimation, is 60 to 65%. The left ventricle has normal function. The left ventricle has no regional wall motion  abnormalities. Global longitudinal strain performed but not reported based on interpreter judgement due to suboptimal tracking. The left ventricular internal cavity size was normal in size. There is no left ventricular hypertrophy. Left ventricular diastolic parameters are consistent with Grade I diastolic dysfunction (impaired relaxation). Right Ventricle: The right ventricular size is normal. No increase in right ventricular wall thickness. Right ventricular systolic function is normal. There is normal pulmonary artery systolic pressure. The tricuspid regurgitant velocity is 2.59  m/s, and  with an assumed right atrial pressure of 3 mmHg, the estimated right ventricular systolic pressure is 71.0 mmHg. Left Atrium: Left atrial size was normal in size. Right Atrium: Right atrial size was normal in size. Pericardium: There is no evidence of pericardial effusion. Mitral Valve: The mitral valve is grossly normal. Trivial mitral valve regurgitation. Tricuspid Valve: The tricuspid valve is not well visualized. Tricuspid valve regurgitation is trivial. Aortic Valve: The aortic valve is tricuspid. Aortic valve regurgitation is not visualized. Pulmonic Valve: The pulmonic valve was grossly normal. Pulmonic valve regurgitation is trivial. Aorta: The aortic root is normal in size and structure and the ascending aorta was not well visualized. Venous: The pulmonary veins were not well visualized. The inferior vena cava is normal in size with greater than 50% respiratory variability, suggesting right atrial pressure of 3 mmHg. IAS/Shunts: The atrial septum is grossly normal.  LEFT VENTRICLE PLAX 2D LVIDd:         4.20 cm  Diastology LVIDs:         2.60 cm  LV e' medial:    4.90 cm/s LV PW:         1.10 cm  LV E/e' medial:  14.2 LV IVS:        0.90 cm  LV e' lateral:   4.68 cm/s LVOT diam:     1.90 cm  LV E/e' lateral: 14.9 LV SV:         61 LV SV Index:   37 LVOT Area:     2.84 cm  RIGHT VENTRICLE RV Basal diam:  2.70 cm RV S  prime:     5.22 cm/s TAPSE (M-mode): 2.4 cm LEFT ATRIUM             Index       RIGHT ATRIUM           Index LA diam:        3.60 cm 2.17 cm/m  RA Area:     10.40 cm LA Vol (A2C):   36.4 ml 21.98 ml/m RA Volume:   20.50 ml  12.38 ml/m LA Vol (A4C):   36.6 ml 22.10 ml/m LA Biplane Vol: 36.7 ml 22.16 ml/m  AORTIC VALVE LVOT Vmax:   92.00 cm/s LVOT Vmean:  70.100 cm/s LVOT VTI:    0.214 m  AORTA Ao Root diam: 2.60 cm MITRAL VALVE               TRICUSPID VALVE MV Area (PHT): 3.77 cm    TR Peak grad:   26.8 mmHg MV Decel Time: 201 msec    TR Vmax:        259.00 cm/s MV E velocity: 69.80 cm/s MV A velocity: 72.80 cm/s  SHUNTS MV E/A ratio:  0.96        Systemic VTI:  0.21 m                            Systemic Diam: 1.90 cm Rudean Haskell MD Electronically signed by Rudean Haskell MD Signature Date/Time: 10/27/2019/4:15:30 PM    Final    IR IMAGING GUIDED PORT INSERTION  Result Date: 11/05/2019 INDICATION: 84 year old female with diffuse large B-cell lymphoma. She presents for port catheter placement. EXAM: IMPLANTED PORT A CATH PLACEMENT WITH ULTRASOUND AND FLUOROSCOPIC GUIDANCE MEDICATIONS: 2 g Ancef; The antibiotic was administered within an appropriate time interval prior to skin puncture. ANESTHESIA/SEDATION: Versed 1 mg IV; Fentanyl 50 mcg IV; Moderate  Sedation Time:  14 minutes The patient was continuously monitored during the procedure by the interventional radiology nurse under my direct supervision. FLUOROSCOPY TIME:  0 minutes, 12 seconds (1 mGy) COMPLICATIONS: None immediate. PROCEDURE: The right neck and chest was prepped with chlorhexidine, and draped in the usual sterile fashion using maximum barrier technique (cap and mask, sterile gown, sterile gloves, large sterile sheet, hand hygiene and cutaneous antiseptic). Local anesthesia was attained by infiltration with 1% lidocaine with epinephrine. Ultrasound demonstrated patency of the right internal jugular vein, and this was documented  with an image. Under real-time ultrasound guidance, this vein was accessed with a 21 gauge micropuncture needle and image documentation was performed. A small dermatotomy was made at the access site with an 11 scalpel. A 0.018" wire was advanced into the SVC and the access needle exchanged for a 14F micropuncture vascular sheath. The 0.018" wire was then removed and a 0.035" wire advanced into the IVC. An appropriate location for the subcutaneous reservoir was selected below the clavicle and an incision was made through the skin and underlying soft tissues. The subcutaneous tissues were then dissected using a combination of blunt and sharp surgical technique and a pocket was formed. A single lumen power injectable portacatheter was then tunneled through the subcutaneous tissues from the pocket to the dermatotomy and the port reservoir placed within the subcutaneous pocket. The venous access site was then serially dilated and a peel away vascular sheath placed over the wire. The wire was removed and the port catheter advanced into position under fluoroscopic guidance. The catheter tip is positioned in the superior cavoatrial junction. This was documented with a spot image. The portacatheter was then tested and found to flush and aspirate well. The port was flushed with saline followed by 100 units/mL heparinized saline. The pocket was then closed in two layers using first subdermal inverted interrupted absorbable sutures followed by a running subcuticular suture. The epidermis was then sealed with Dermabond. The dermatotomy at the venous access site was also closed with Dermabond. IMPRESSION: Successful placement of a right IJ approach Power Port with ultrasound and fluoroscopic guidance. The catheter is ready for use. Electronically Signed   By: Jacqulynn Cadet M.D.   On: 11/05/2019 14:43    ASSESSMENT & PLAN NAZARET CHEA 84 y.o. female with medical history significant for Stage II DLBCL of the tonsil who  presents for a follow up visit.  After review the labs, the records, discussion with the patient the findings most consistent with a stage II diffuse large B-cell lymphoma of the tonsil.  She has involvement of the right tonsil as well as axillary lymph nodes which would constitute stage II disease.  She does have some mild activity in other lymph nodes throughout the body, but nowhere near as active as the ones within her head neck.  Lymphomas involvement of these lymph nodes is not likely and therefore I would proceed with treatment as if the patient were a stage II.  On exam today Mrs. Clovia Cuff notes that she has been well in the interim since her last visit.  She is not currently having any new symptoms and is willing and able to proceed with her first cycle of chemotherapy.  Previously we discussed the diagnosis of diffuse large B-cell lymphoma and the treatment options moving forward.  The regimen most recommended for this patient would be R mini CHOP with consideration for 3 cycles followed by radiation to the local lymph nodes.  This is consistent  with the treatment course to be recommended with R-CHOP chemotherapy for stage II disease.  We are still currently waiting for the results of the double hit panel, however this would not likely change management as the patient would not be able to tolerate full strength chemotherapy with something like R-EPOCH.  We also discussed the risks and benefits of this R-CHOP chemotherapy including constipation, neurotoxicity, cardiac toxicity, drop in blood counts, fatigue, nausea, vomiting, and alopecia.  The patient voiced her understanding of these complications and wished to proceed forward with treatment.  The regimen of R- miniCHOP consists of rituximab 325 mg per metered squared, cyclophosphamide 400 mg per metered squared IV, doxorubicin 25 mg per metered squared IV, vincristine 1 mg IV, and prednisone 40 mg per metered squared p.o. on days 1 through 5.   This will be pursued for 3 cycles of chemotherapy with the addition of radiation therapy.  Based on NCCN recommendations, member institutes do practice this regimen, though data is not robust.  # Diffuse Large B Cell Lymphoma, Stage II --complete staging with a PET CT scan, findings most consistent with a Stage II DLBCL of the tonsil.  --today is Cycle 1 Day 1 of R-mini-CHOP  --awaiting results from [facility] for [place], BCL2 and BCL6. This would not change initial management as I would not pursue R-EPOCH or intensive chemotherapy at her age.  -- TTE shows EF 60-65% with mild diastolic dysfunction --will schedule port placement and chemotherapy education --tentatively plan for 3 cycles followed by local radiation.  --RTC in 3 weeks for next cycle R-miniCHOP with interval weekly lab visits.   #Supportive Care --chemotherapy education completed --zofran 8mg  q8H PRN and compazine 10mg  PO q6H for nausea -- allopurinol 300mg  PO daily for TLS prophylaxis -- EMLA cream for port -- no pain medication required at this time.   No orders of the defined types were placed in this encounter.  All questions were answered. The patient knows to call the clinic with any problems, questions or concerns.  A total of more than 25 minutes were spent on this encounter and over half of that time was spent on counseling and coordination of care as outlined above.   Ledell Peoples, MD Department of Hematology/Oncology [place] at [facility] Phone: [phone] Pager: [phone] Email: [email or web address]  11/13/2019 8:36 AM

## 2019-11-13 NOTE — Progress Notes (Signed)
Per MD ok to proceed with chemo on labs from 10/20 but would like to have labs done today.  They need to be released.  MD will give patient RX for prednisone.

## 2019-11-13 NOTE — Patient Instructions (Signed)
[facility] Discharge Instructions for Patients Receiving Chemotherapy  Today you received the following chemotherapy agents: Doxorubicin, Vincristine, Cytoxan, and Rituximab.  To help prevent nausea and vomiting after your treatment, we encourage you to take your nausea medication as directed by your MD.   If you develop nausea and vomiting that is not controlled by your nausea medication, call the clinic.   BELOW ARE SYMPTOMS THAT SHOULD BE REPORTED IMMEDIATELY:  *FEVER GREATER THAN 100.5 F  *CHILLS WITH OR WITHOUT FEVER  NAUSEA AND VOMITING THAT IS NOT CONTROLLED WITH YOUR NAUSEA MEDICATION  *UNUSUAL SHORTNESS OF BREATH  *UNUSUAL BRUISING OR BLEEDING  TENDERNESS IN MOUTH AND THROAT WITH OR WITHOUT PRESENCE OF ULCERS  *URINARY PROBLEMS  *BOWEL PROBLEMS  UNUSUAL RASH Items with * indicate a potential emergency and should be followed up as soon as possible.  Feel free to call the clinic should you have any questions or concerns. The clinic phone number is [phone].  Please show the [facility] at check-in to the Emergency Department and triage nurse.  Doxorubicin injection What is this medicine? DOXORUBICIN (dox oh ROO bi sin) is a chemotherapy drug. It is used to treat many kinds of cancer like leukemia, lymphoma, neuroblastoma, sarcoma, and Wilms' tumor. It is also used to treat bladder cancer, breast cancer, lung cancer, ovarian cancer, stomach cancer, and thyroid cancer. This medicine may be used for other purposes; ask your health care provider or pharmacist if you have questions. COMMON BRAND NAME(S): Adriamycin, Adriamycin PFS, Adriamycin RDF, Rubex What should I tell my health care provider before I take this medicine? They need to know if you have any of these conditions:  heart disease  history of low blood counts caused by a medicine  liver disease  recent or ongoing radiation therapy  an unusual or allergic reaction to  doxorubicin, other chemotherapy agents, other medicines, foods, dyes, or preservatives  pregnant or trying to get pregnant  breast-feeding How should I use this medicine? This drug is given as an infusion into a vein. It is administered in a hospital or clinic by a specially trained health care professional. If you have pain, swelling, burning or any unusual feeling around the site of your injection, tell your health care professional right away. Talk to your pediatrician regarding the use of this medicine in children. Special care may be needed. Overdosage: If you think you have taken too much of this medicine contact a poison control center or emergency room at once. NOTE: This medicine is only for you. Do not share this medicine with others. What if I miss a dose? It is important not to miss your dose. Call your doctor or health care professional if you are unable to keep an appointment. What may interact with this medicine? This medicine may interact with the following medications:  6-mercaptopurine  paclitaxel  phenytoin  St. John's Wort  trastuzumab  verapamil This list may not describe all possible interactions. Give your health care provider a list of all the medicines, herbs, non-prescription drugs, or dietary supplements you use. Also tell them if you smoke, drink alcohol, or use illegal drugs. Some items may interact with your medicine. What should I watch for while using this medicine? This drug may make you feel generally unwell. This is not uncommon, as chemotherapy can affect healthy cells as well as cancer cells. Report any side effects. Continue your course of treatment even though you feel ill unless your doctor tells you to stop.  There is a maximum amount of this medicine you should receive throughout your life. The amount depends on the medical condition being treated and your overall health. Your doctor will watch how much of this medicine you receive in your  lifetime. Tell your doctor if you have taken this medicine before. You may need blood work done while you are taking this medicine. Your urine may turn red for a few days after your dose. This is not blood. If your urine is dark or brown, call your doctor. In some cases, you may be given additional medicines to help with side effects. Follow all directions for their use. Call your doctor or health care professional for advice if you get a fever, chills or sore throat, or other symptoms of a cold or flu. Do not treat yourself. This drug decreases your body's ability to fight infections. Try to avoid being around people who are sick. This medicine may increase your risk to bruise or bleed. Call your doctor or health care professional if you notice any unusual bleeding. Talk to your doctor about your risk of cancer. You may be more at risk for certain types of cancers if you take this medicine. Do not become pregnant while taking this medicine or for 6 months after stopping it. Women should inform their doctor if they wish to become pregnant or think they might be pregnant. Men should not father a child while taking this medicine and for 6 months after stopping it. There is a potential for serious side effects to an unborn child. Talk to your health care professional or pharmacist for more information. Do not breast-feed an infant while taking this medicine. This medicine has caused ovarian failure in some women and reduced sperm counts in some men This medicine may interfere with the ability to have a child. Talk with your doctor or health care professional if you are concerned about your fertility. This medicine may cause a decrease in Co-Enzyme Q-10. You should make sure that you get enough Co-Enzyme Q-10 while you are taking this medicine. Discuss the foods you eat and the vitamins you take with your health care professional. What side effects may I notice from receiving this medicine? Side effects that  you should report to your doctor or health care professional as soon as possible:  allergic reactions like skin rash, itching or hives, swelling of the face, lips, or tongue  breathing problems  chest pain  fast or irregular heartbeat  low blood counts - this medicine may decrease the number of white blood cells, red blood cells and platelets. You may be at increased risk for infections and bleeding.  pain, redness, or irritation at site where injected  signs of infection - fever or chills, cough, sore throat, pain or difficulty passing urine  signs of decreased platelets or bleeding - bruising, pinpoint red spots on the skin, black, tarry stools, blood in the urine  swelling of the ankles, feet, hands  tiredness  weakness Side effects that usually do not require medical attention (report to your doctor or health care professional if they continue or are bothersome):  diarrhea  hair loss  mouth sores  nail discoloration or damage  nausea  red colored urine  vomiting This list may not describe all possible side effects. Call your doctor for medical advice about side effects. You may report side effects to FDA at 1-800-FDA-1088. Where should I keep my medicine? This drug is given in a hospital or clinic and will  not be stored at home. NOTE: This sheet is a summary. It may not cover all possible information. If you have questions about this medicine, talk to your doctor, pharmacist, or health care provider.  2020 Elsevier/Gold Standard (2016-08-09 11:01:26)  Vincristine injection What is this medicine? VINCRISTINE (vin KRIS teen) is a chemotherapy drug. It slows the growth of cancer cells. This medicine is used to treat many types of cancer like Hodgkin's disease, leukemia, non-Hodgkin's lymphoma, neuroblastoma (brain cancer), rhabdomyosarcoma, and Wilms' tumor. This medicine may be used for other purposes; ask your health care provider or pharmacist if you have  questions. COMMON BRAND NAME(S): Oncovin, Vincasar PFS What should I tell my health care provider before I take this medicine? They need to know if you have any of these conditions:  blood disorders  gout  infection (especially chickenpox, cold sores, or herpes)  kidney disease  liver disease  lung disease  nervous system disease like Charcot-Marie-Tooth (CMT)  recent or ongoing radiation therapy  an unusual or allergic reaction to vincristine, other chemotherapy agents, other medicines, foods, dyes, or preservatives  pregnant or trying to get pregnant  breast-feeding How should I use this medicine? This drug is given as an infusion into a vein. It is administered in a hospital or clinic by a specially trained health care professional. If you have pain, swelling, burning, or any unusual feeling around the site of your injection, tell your health care professional right away. Talk to your pediatrician regarding the use of this medicine in children. While this drug may be prescribed for selected conditions, precautions do apply. Overdosage: If you think you have taken too much of this medicine contact a poison control center or emergency room at once. NOTE: This medicine is only for you. Do not share this medicine with others. What if I miss a dose? It is important not to miss your dose. Call your doctor or health care professional if you are unable to keep an appointment. What may interact with this medicine? Do not take this medicine with any of the following medications:  itraconazole  mibefradil  voriconazole This medicine may also interact with the following medications:  cyclosporine  erythromycin  fluconazole  ketoconazole  medicines for HIV like delavirdine, efavirenz, nevirapine  medicines for seizures like ethotoin, fosphenotoin, phenytoin  medicines to increase blood counts like filgrastim, pegfilgrastim, sargramostim  other chemotherapy drugs like  cisplatin, L-asparaginase, methotrexate, mitomycin, paclitaxel  pegaspargase  vaccines  zalcitabine, ddC Talk to your doctor or health care professional before taking any of these medicines:  acetaminophen  aspirin  ibuprofen  ketoprofen  naproxen This list may not describe all possible interactions. Give your health care provider a list of all the medicines, herbs, non-prescription drugs, or dietary supplements you use. Also tell them if you smoke, drink alcohol, or use illegal drugs. Some items may interact with your medicine. What should I watch for while using this medicine? This drug may make you feel generally unwell. This is not uncommon, as chemotherapy can affect healthy cells as well as cancer cells. Report any side effects. Continue your course of treatment even though you feel ill unless your doctor tells you to stop. You may need blood work done while you are taking this medicine. This medicine will cause constipation. Try to have a bowel movement at least every 2 to 3 days. If you do not have a bowel movement for 3 days, call your doctor or health care professional. In some cases, you may be  given additional medicines to help with side effects. Follow all directions for their use. Do not become pregnant while taking this medicine. Women should inform their doctor if they wish to become pregnant or think they might be pregnant. There is a potential for serious side effects to an unborn child. Talk to your health care professional or pharmacist for more information. Do not breast-feed an infant while taking this medicine. This medicine may make it more difficult to get pregnant or to father a child. Talk to your healthcare professional if you are concerned about your fertility. What side effects may I notice from receiving this medicine? Side effects that you should report to your doctor or health care professional as soon as possible:  allergic reactions like skin rash,  itching or hives, swelling of the face, lips, or tongue  breathing problems  confusion or changes in emotions or moods  constipation  cough  mouth sores  muscle weakness  nausea and vomiting  pain, swelling, redness or irritation at the injection site  pain, tingling, numbness in the hands or feet  problems with balance, talking, walking  seizures  stomach pain  trouble passing urine or change in the amount of urine Side effects that usually do not require medical attention (report to your doctor or health care professional if they continue or are bothersome):  diarrhea  hair loss  jaw pain  loss of appetite This list may not describe all possible side effects. Call your doctor for medical advice about side effects. You may report side effects to FDA at 1-800-FDA-1088. Where should I keep my medicine? This drug is given in a hospital or clinic and will not be stored at home. NOTE: This sheet is a summary. It may not cover all possible information. If you have questions about this medicine, talk to your doctor, pharmacist, or health care provider.  2020 Elsevier/Gold Standard (2017-09-28 08:55:02)  Cyclophosphamide Injection What is this medicine? CYCLOPHOSPHAMIDE (sye kloe FOSS fa mide) is a chemotherapy drug. It slows the growth of cancer cells. This medicine is used to treat many types of cancer like lymphoma, myeloma, leukemia, breast cancer, and ovarian cancer, to name a few. This medicine may be used for other purposes; ask your health care provider or pharmacist if you have questions. COMMON BRAND NAME(S): Cytoxan, Neosar What should I tell my health care provider before I take this medicine? They need to know if you have any of these conditions:  heart disease  history of irregular heartbeat  infection  kidney disease  liver disease  low blood counts, like white cells, platelets, or red blood cells  on hemodialysis  recent or ongoing radiation  therapy  scarring or thickening of the lungs  trouble passing urine  an unusual or allergic reaction to cyclophosphamide, other medicines, foods, dyes, or preservatives  pregnant or trying to get pregnant  breast-feeding How should I use this medicine? This drug is usually given as an injection into a vein or muscle or by infusion into a vein. It is administered in a hospital or clinic by a specially trained health care professional. Talk to your pediatrician regarding the use of this medicine in children. Special care may be needed. Overdosage: If you think you have taken too much of this medicine contact a poison control center or emergency room at once. NOTE: This medicine is only for you. Do not share this medicine with others. What if I miss a dose? It is important not to miss your  dose. Call your doctor or health care professional if you are unable to keep an appointment. What may interact with this medicine?  amphotericin B  azathioprine  certain antivirals for HIV or hepatitis  certain medicines for blood pressure, heart disease, irregular heart beat  certain medicines that treat or prevent blood clots like warfarin  certain other medicines for cancer  cyclosporine  etanercept  indomethacin  medicines that relax muscles for surgery  medicines to increase blood counts  metronidazole This list may not describe all possible interactions. Give your health care provider a list of all the medicines, herbs, non-prescription drugs, or dietary supplements you use. Also tell them if you smoke, drink alcohol, or use illegal drugs. Some items may interact with your medicine. What should I watch for while using this medicine? Your condition will be monitored carefully while you are receiving this medicine. You may need blood work done while you are taking this medicine. Drink water or other fluids as directed. Urinate often, even at night. Some products may contain alcohol.  Ask your health care professional if this medicine contains alcohol. Be sure to tell all health care professionals you are taking this medicine. Certain medicines, like metronidazole and disulfiram, can cause an unpleasant reaction when taken with alcohol. The reaction includes flushing, headache, nausea, vomiting, sweating, and increased thirst. The reaction can last from 30 minutes to several hours. Do not become pregnant while taking this medicine or for 1 year after stopping it. Women should inform their health care professional if they wish to become pregnant or think they might be pregnant. Men should not father a child while taking this medicine and for 4 months after stopping it. There is potential for serious side effects to an unborn child. Talk to your health care professional for more information. Do not breast-feed an infant while taking this medicine or for 1 week after stopping it. This medicine has caused ovarian failure in some women. This medicine may make it more difficult to get pregnant. Talk to your health care professional if you are concerned about your fertility. This medicine has caused decreased sperm counts in some men. This may make it more difficult to father a child. Talk to your health care professional if you are concerned about your fertility. Call your health care professional for advice if you get a fever, chills, or sore throat, or other symptoms of a cold or flu. Do not treat yourself. This medicine decreases your body's ability to fight infections. Try to avoid being around people who are sick. Avoid taking medicines that contain aspirin, acetaminophen, ibuprofen, naproxen, or ketoprofen unless instructed by your health care professional. These medicines may hide a fever. Talk to your health care professional about your risk of cancer. You may be more at risk for certain types of cancer if you take this medicine. If you are going to need surgery or other procedure, tell  your health care professional that you are using this medicine. Be careful brushing or flossing your teeth or using a toothpick because you may get an infection or bleed more easily. If you have any dental work done, tell your dentist you are receiving this medicine. What side effects may I notice from receiving this medicine? Side effects that you should report to your doctor or health care professional as soon as possible:  allergic reactions like skin rash, itching or hives, swelling of the face, lips, or tongue  breathing problems  nausea, vomiting  signs and symptoms of  bleeding such as bloody or black, tarry stools; red or dark brown urine; spitting up blood or brown material that looks like coffee grounds; red spots on the skin; unusual bruising or bleeding from the eyes, gums, or nose  signs and symptoms of heart failure like fast, irregular heartbeat, sudden weight gain; swelling of the ankles, feet, hands  signs and symptoms of infection like fever; chills; cough; sore throat; pain or trouble passing urine  signs and symptoms of kidney injury like trouble passing urine or change in the amount of urine  signs and symptoms of liver injury like dark yellow or brown urine; general ill feeling or flu-like symptoms; light-colored stools; loss of appetite; nausea; right upper belly pain; unusually weak or tired; yellowing of the eyes or skin Side effects that usually do not require medical attention (report to your doctor or health care professional if they continue or are bothersome):  confusion  decreased hearing  diarrhea  facial flushing  hair loss  headache  loss of appetite  missed menstrual periods  signs and symptoms of low red blood cells or anemia such as unusually weak or tired; feeling faint or lightheaded; falls  skin discoloration This list may not describe all possible side effects. Call your doctor for medical advice about side effects. You may report side  effects to FDA at 1-800-FDA-1088. Where should I keep my medicine? This drug is given in a hospital or clinic and will not be stored at home. NOTE: This sheet is a summary. It may not cover all possible information. If you have questions about this medicine, talk to your doctor, pharmacist, or health care provider.  2020 Elsevier/Gold Standard (2018-09-30 09:53:29)  Rituximab injection What is this medicine? RITUXIMAB (ri TUX i mab) is a monoclonal antibody. It is used to treat certain types of cancer like non-Hodgkin lymphoma and chronic lymphocytic leukemia. It is also used to treat rheumatoid arthritis, granulomatosis with polyangiitis (or Wegener's granulomatosis), microscopic polyangiitis, and pemphigus vulgaris. This medicine may be used for other purposes; ask your health care provider or pharmacist if you have questions. COMMON BRAND NAME(S): Rituxan, RUXIENCE What should I tell my health care provider before I take this medicine? They need to know if you have any of these conditions:  heart disease  infection (especially a virus infection such as hepatitis B, chickenpox, cold sores, or herpes)  immune system problems  irregular heartbeat  kidney disease  low blood counts, like low white cell, platelet, or red cell counts  lung or breathing disease, like asthma  recently received or scheduled to receive a vaccine  an unusual or allergic reaction to rituximab, other medicines, foods, dyes, or preservatives  pregnant or trying to get pregnant  breast-feeding How should I use this medicine? This medicine is for infusion into a vein. It is administered in a hospital or clinic by a specially trained health care professional. A special MedGuide will be given to you by the pharmacist with each prescription and refill. Be sure to read this information carefully each time. Talk to your pediatrician regarding the use of this medicine in children. This medicine is not approved for  use in children. Overdosage: If you think you have taken too much of this medicine contact a poison control center or emergency room at once. NOTE: This medicine is only for you. Do not share this medicine with others. What if I miss a dose? It is important not to miss a dose. Call your doctor or health care  professional if you are unable to keep an appointment. What may interact with this medicine?  cisplatin  live virus vaccines This list may not describe all possible interactions. Give your health care provider a list of all the medicines, herbs, non-prescription drugs, or dietary supplements you use. Also tell them if you smoke, drink alcohol, or use illegal drugs. Some items may interact with your medicine. What should I watch for while using this medicine? Your condition will be monitored carefully while you are receiving this medicine. You may need blood work done while you are taking this medicine. This medicine can cause serious allergic reactions. To reduce your risk you may need to take medicine before treatment with this medicine. Take your medicine as directed. In some patients, this medicine may cause a serious brain infection that may cause death. If you have any problems seeing, thinking, speaking, walking, or standing, tell your healthcare professional right away. If you cannot reach your healthcare professional, urgently seek other source of medical care. Call your doctor or health care professional for advice if you get a fever, chills or sore throat, or other symptoms of a cold or flu. Do not treat yourself. This drug decreases your body's ability to fight infections. Try to avoid being around people who are sick. Do not become pregnant while taking this medicine or for at least 12 months after stopping it. Women should inform their doctor if they wish to become pregnant or think they might be pregnant. There is a potential for serious side effects to an unborn child. Talk to your  health care professional or pharmacist for more information. Do not breast-feed an infant while taking this medicine or for at least 6 months after stopping it. What side effects may I notice from receiving this medicine? Side effects that you should report to your doctor or health care professional as soon as possible:  allergic reactions like skin rash, itching or hives; swelling of the face, lips, or tongue  breathing problems  chest pain  changes in vision  diarrhea  headache with fever, neck stiffness, sensitivity to light, nausea, or confusion  fast, irregular heartbeat  loss of memory  low blood counts - this medicine may decrease the number of white blood cells, red blood cells and platelets. You may be at increased risk for infections and bleeding.  mouth sores  problems with balance, talking, or walking  redness, blistering, peeling or loosening of the skin, including inside the mouth  signs of infection - fever or chills, cough, sore throat, pain or difficulty passing urine  signs and symptoms of kidney injury like trouble passing urine or change in the amount of urine  signs and symptoms of liver injury like dark yellow or brown urine; general ill feeling or flu-like symptoms; light-colored stools; loss of appetite; nausea; right upper belly pain; unusually weak or tired; yellowing of the eyes or skin  signs and symptoms of low blood pressure like dizziness; feeling faint or lightheaded, falls; unusually weak or tired  stomach pain  swelling of the ankles, feet, hands  unusual bleeding or bruising  vomiting Side effects that usually do not require medical attention (report to your doctor or health care professional if they continue or are bothersome):  headache  joint pain  muscle cramps or muscle pain  nausea  tiredness This list may not describe all possible side effects. Call your doctor for medical advice about side effects. You may report side  effects to FDA at 1-800-FDA-1088.  Where should I keep my medicine? This drug is given in a hospital or clinic and will not be stored at home. NOTE: This sheet is a summary. It may not cover all possible information. If you have questions about this medicine, talk to your doctor, pharmacist, or health care provider.  2020 Elsevier/Gold Standard (2018-02-06 22:01:36)

## 2019-11-14 ENCOUNTER — Telehealth: Payer: Self-pay | Admitting: *Deleted

## 2019-11-14 ENCOUNTER — Other Ambulatory Visit: Payer: Self-pay | Admitting: *Deleted

## 2019-11-14 NOTE — Telephone Encounter (Signed)
TCT patient to follow up with her after her 1st chemotherapy treatment. I had spoken to her earlier in the day about her prednisone prescription.  She was doing well earlier. Left vm message for her to return call on Monday, 11/17/19 to check on her after chemo

## 2019-11-15 ENCOUNTER — Inpatient Hospital Stay: Payer: Medicare Other

## 2019-11-15 ENCOUNTER — Other Ambulatory Visit: Payer: Self-pay

## 2019-11-15 VITALS — BP 147/74 | HR 81 | Temp 97.6°F | Resp 18

## 2019-11-15 DIAGNOSIS — C8331 Diffuse large B-cell lymphoma, lymph nodes of head, face, and neck: Secondary | ICD-10-CM

## 2019-11-15 DIAGNOSIS — Z5112 Encounter for antineoplastic immunotherapy: Secondary | ICD-10-CM | POA: Diagnosis not present

## 2019-11-15 MED ORDER — PEGFILGRASTIM-CBQV 6 MG/0.6ML ~~LOC~~ SOSY
6.0000 mg | PREFILLED_SYRINGE | Freq: Once | SUBCUTANEOUS | Status: AC
  Administered 2019-11-15: 6 mg via SUBCUTANEOUS

## 2019-11-15 NOTE — Patient Instructions (Signed)

## 2019-11-17 ENCOUNTER — Inpatient Hospital Stay: Payer: Medicare Other

## 2019-11-17 ENCOUNTER — Encounter: Payer: Self-pay | Admitting: Hematology and Oncology

## 2019-11-17 ENCOUNTER — Other Ambulatory Visit: Payer: Self-pay

## 2019-11-17 ENCOUNTER — Other Ambulatory Visit: Payer: Self-pay | Admitting: Hematology and Oncology

## 2019-11-17 ENCOUNTER — Other Ambulatory Visit: Payer: Self-pay | Admitting: *Deleted

## 2019-11-17 DIAGNOSIS — C8331 Diffuse large B-cell lymphoma, lymph nodes of head, face, and neck: Secondary | ICD-10-CM

## 2019-11-17 DIAGNOSIS — Z5112 Encounter for antineoplastic immunotherapy: Secondary | ICD-10-CM | POA: Diagnosis not present

## 2019-11-17 LAB — CMP (CANCER CENTER ONLY)
ALT: 19 U/L (ref 0–44)
AST: 23 U/L (ref 15–41)
Albumin: 3.5 g/dL (ref 3.5–5.0)
Alkaline Phosphatase: 138 U/L — ABNORMAL HIGH (ref 38–126)
Anion gap: 7 (ref 5–15)
BUN: 13 mg/dL (ref 8–23)
CO2: 34 mmol/L — ABNORMAL HIGH (ref 22–32)
Calcium: 8.9 mg/dL (ref 8.9–10.3)
Chloride: 98 mmol/L (ref 98–111)
Creatinine: 0.71 mg/dL (ref 0.44–1.00)
GFR, Estimated: >60 mL/min (ref >60)
Glucose, Bld: 90 mg/dL (ref 70–99)
Potassium: 3.1 mmol/L — ABNORMAL LOW (ref 3.5–5.1)
Sodium: 139 mmol/L (ref 135–145)
Total Bilirubin: 1.4 mg/dL — ABNORMAL HIGH (ref 0.3–1.2)
Total Protein: 7.2 g/dL (ref 6.5–8.1)

## 2019-11-17 LAB — CBC WITH DIFFERENTIAL (CANCER CENTER ONLY)
Abs Immature Granulocytes: 0 10*3/uL (ref 0.00–0.07)
Band Neutrophils: 2 %
Basophils Absolute: 0 10*3/uL (ref 0.0–0.1)
Basophils Relative: 0 %
Eosinophils Absolute: 0.5 10*3/uL (ref 0.0–0.5)
Eosinophils Relative: 1 %
HCT: 36.5 % (ref 36.0–46.0)
Hemoglobin: 12 g/dL (ref 12.0–15.0)
Lymphocytes Relative: 9 %
Lymphs Abs: 4.2 10*3/uL — ABNORMAL HIGH (ref 0.7–4.0)
MCH: 31.9 pg (ref 26.0–34.0)
MCHC: 32.9 g/dL (ref 30.0–36.0)
MCV: 97.1 fL (ref 80.0–100.0)
Monocytes Absolute: 0 10*3/uL — ABNORMAL LOW (ref 0.1–1.0)
Monocytes Relative: 0 %
Neutro Abs: 41.6 10*3/uL — ABNORMAL HIGH (ref 1.7–7.7)
Neutrophils Relative %: 88 %
Platelet Count: 156 10*3/uL (ref 150–400)
RBC: 3.76 MIL/uL — ABNORMAL LOW (ref 3.87–5.11)
RDW: 14.2 % (ref 11.5–15.5)
WBC Count: 46.2 10*3/uL — ABNORMAL HIGH (ref 4.0–10.5)
nRBC: 0 % (ref 0.0–0.2)

## 2019-11-17 LAB — LACTATE DEHYDROGENASE: LDH: 255 U/L — ABNORMAL HIGH (ref 98–192)

## 2019-11-17 MED ORDER — PREDNISONE 20 MG PO TABS: 60.0000 mg | ORAL_TABLET | Freq: Every day | ORAL | 3 refills | Status: DC

## 2019-11-17 NOTE — Progress Notes (Signed)
Met with patient at registration to introduce myself as Financial Resource Specialist and to offer available resources. ° °Discussed one-time $1000 [employer] grant and qualifications to assist with personal expenses while going through treatment. ° °Gave her my card if interested in applying and for any additional financial questions or concerns.  °

## 2019-11-18 ENCOUNTER — Encounter: Payer: Self-pay | Admitting: Hematology and Oncology

## 2019-11-24 ENCOUNTER — Other Ambulatory Visit: Payer: Self-pay | Admitting: Hematology and Oncology

## 2019-11-24 ENCOUNTER — Inpatient Hospital Stay: Payer: Medicare Other

## 2019-11-24 ENCOUNTER — Other Ambulatory Visit: Payer: Self-pay

## 2019-11-24 DIAGNOSIS — C8331 Diffuse large B-cell lymphoma, lymph nodes of head, face, and neck: Secondary | ICD-10-CM

## 2019-11-24 DIAGNOSIS — Z5112 Encounter for antineoplastic immunotherapy: Secondary | ICD-10-CM | POA: Diagnosis not present

## 2019-11-24 LAB — CMP (CANCER CENTER ONLY)
ALT: 34 U/L (ref 0–44)
AST: 38 U/L (ref 15–41)
Albumin: 3.6 g/dL (ref 3.5–5.0)
Alkaline Phosphatase: 152 U/L — ABNORMAL HIGH (ref 38–126)
Anion gap: 6 (ref 5–15)
BUN: <4 mg/dL — ABNORMAL LOW (ref 8–23)
CO2: 33 mmol/L — ABNORMAL HIGH (ref 22–32)
Calcium: 8.7 mg/dL — ABNORMAL LOW (ref 8.9–10.3)
Chloride: 101 mmol/L (ref 98–111)
Creatinine: 0.77 mg/dL (ref 0.44–1.00)
GFR, Estimated: >60 mL/min (ref >60)
Glucose, Bld: 92 mg/dL (ref 70–99)
Potassium: 3.5 mmol/L (ref 3.5–5.1)
Sodium: 140 mmol/L (ref 135–145)
Total Bilirubin: 0.3 mg/dL (ref 0.3–1.2)
Total Protein: 7.4 g/dL (ref 6.5–8.1)

## 2019-11-24 LAB — CBC WITH DIFFERENTIAL (CANCER CENTER ONLY)
Abs Immature Granulocytes: 1.25 10*3/uL — ABNORMAL HIGH (ref 0.00–0.07)
Basophils Absolute: 0.1 10*3/uL (ref 0.0–0.1)
Basophils Relative: 1 %
Eosinophils Absolute: 0.2 10*3/uL (ref 0.0–0.5)
Eosinophils Relative: 1 %
HCT: 36 % (ref 36.0–46.0)
Hemoglobin: 11.5 g/dL — ABNORMAL LOW (ref 12.0–15.0)
Immature Granulocytes: 7 %
Lymphocytes Relative: 27 %
Lymphs Abs: 4.6 10*3/uL — ABNORMAL HIGH (ref 0.7–4.0)
MCH: 31.5 pg (ref 26.0–34.0)
MCHC: 31.9 g/dL (ref 30.0–36.0)
MCV: 98.6 fL (ref 80.0–100.0)
Monocytes Absolute: 1.2 10*3/uL — ABNORMAL HIGH (ref 0.1–1.0)
Monocytes Relative: 7 %
Neutro Abs: 10.1 10*3/uL — ABNORMAL HIGH (ref 1.7–7.7)
Neutrophils Relative %: 57 %
Platelet Count: 217 10*3/uL (ref 150–400)
RBC: 3.65 MIL/uL — ABNORMAL LOW (ref 3.87–5.11)
RDW: 14.1 % (ref 11.5–15.5)
WBC Count: 17.5 10*3/uL — ABNORMAL HIGH (ref 4.0–10.5)
nRBC: 0.3 % — ABNORMAL HIGH (ref 0.0–0.2)

## 2019-11-24 LAB — LACTATE DEHYDROGENASE: LDH: 263 U/L — ABNORMAL HIGH (ref 98–192)

## 2019-11-24 LAB — URIC ACID: Uric Acid, Serum: 3.7 mg/dL (ref 2.5–7.1)

## 2019-11-27 ENCOUNTER — Telehealth: Payer: Self-pay | Admitting: *Deleted

## 2019-11-27 NOTE — Telephone Encounter (Signed)
TCT patient regarding lab results. No answer but was able to leave vm message stating that her labs are stable. Advised that she can call back with any questions or concerns. @[phone]

## 2019-11-27 NOTE — Telephone Encounter (Signed)
-----   Message from Orson Slick, MD sent at 11/26/2019 10:44 AM EST ----- Please let Amy [name] know that her labs have been stable since her 1st dose of chemotherapy. We will see her next week prior to her 2nd cycle of chemotherapy.  ----- Message ----- From: Buel Ream, Lab In [place] Sent: 11/24/2019  12:46 PM EST To: Orson Slick, MD

## 2019-12-04 ENCOUNTER — Other Ambulatory Visit: Payer: Self-pay | Admitting: Hematology and Oncology

## 2019-12-04 DIAGNOSIS — C8331 Diffuse large B-cell lymphoma, lymph nodes of head, face, and neck: Secondary | ICD-10-CM

## 2019-12-04 NOTE — Progress Notes (Signed)
[facility] [phone]   [phone]  PROGRESS NOTE  Patient Care Team: Amie Critchley as PCP - General (Family Medicine)  Hematological/Oncological History # Diffuse Large B Cell Lymphoma, Stage II 1) 09/26/2019: CT scan of neck performed due to persistent tonsillar lesion after outpatient antibiotic course. CT scan showed a heterogeneous right tonsillar mass up to 3.2 cm and a small but suspicious right level 2A lymph node  2) 09/30/2019: patient underwent biopsy with ENT which revealed a DLBCL with FISH for MYC, BCL2 and BCL6 pending 3) 10/08/2019: establish care with Dr. Lorenso Courier  4) 10/21/2019: PET scan showed hypermetabolic right tonsillar mass and adjacent right level 2 adenopathy, consistent with the given history of lymphoma. 5) 11/13/2019: Cycle 1 Day 1 of R-miniCHOP 6) 12/05/2019: Cycle 2 Day 1 of R-miniCHOP  Interval History:  Amy [name] 84 y.o. female with medical history significant for Stage II DLBCL of the tonsil who presents for a follow up visit. The patient's last visit was on 11/13/2019. In the interim since the last visit she has completed Cycle 1 Day 1 of R-miniCHOP. Today is Cycle 2 Day 1.   On exam today Mrs. Amy [name] reports that she tolerated cycle 1 quite well.  She notes that she did have some nausea the day after but not enough to take any medications.  She has also had a little bit of hair loss with cycle 1 of chemotherapy.  She denies having any issues with diarrhea or constipation.  She notes that she not have any bone pain other than a little soreness after receiving the G-CSF shot.  Overall she feels like she tolerated treatment well.  Her appetite has decreased, though she has gained 2 pounds of weight.  She is also taking all of her prophylactic medicines appropriately.  She denies any fevers, chills, sweats.  A full 10 point ROS is listed below.  MEDICAL HISTORY:  Past Medical History:  Diagnosis Date  . Anxiety    . Arthritis   . Atrial fibrillation ([place])   . COPD (chronic obstructive pulmonary disease) ([place])   . Esophageal stricture    s/p repeated dilations, Dr. Watt Climes  . GERD (gastroesophageal reflux disease)   . Hearing loss bilateral   has hearing aids  . Heart murmur   . Hiatal hernia   . History of blood transfusion    many years ago   . Hyperlipidemia   . Hypothyroidism   . Osteoarthritis   . Osteoporosis   . Pulmonary nodules   . Renal lesion    1cm left kidney    SURGICAL HISTORY: Past Surgical History:  Procedure Laterality Date  . ABDOMINAL HYSTERECTOMY    . BREAST ENHANCEMENT SURGERY  2006  . CATARACT EXTRACTION W/ INTRAOCULAR LENS  IMPLANT, BILATERAL  '09  . ESOPHAGEAL DILATION    . IR IMAGING GUIDED PORT INSERTION  11/05/2019  . KNEE ARTHROSCOPY  02/22/2011   Procedure: ARTHROSCOPY KNEE;  Surgeon: Gearlean Alf, MD;  [facility];  Service: Orthopedics;  Laterality: Right;  WITH DEBRIDEMENT   . LAPAROSCOPIC SMALL BOWEL RESECTION N/A 06/02/2016   Procedure: DIAGNOSTIC LAPAROSCOPY SMALL BOWEL RESECTION;  Surgeon: Armandina Gemma, MD;  Location: [facility];  Service: General;  Laterality: N/A;    SOCIAL HISTORY: Social History   Socioeconomic History  . Marital status: Widowed    Spouse name: Not on file  . Number of children: 3  . Years of education: 63  . Highest  education level: Not on file  Occupational History  . Not on file  Tobacco Use  . Smoking status: Former Smoker    Packs/day: 1.00    Years: 30.00    Pack years: 30.00    Types: Cigarettes    Quit date: 01/09/1997    Years since quitting: 22.9  . Smokeless tobacco: Never Used  Vaping Use  . Vaping Use: Never used  Substance and Sexual Activity  . Alcohol use: No  . Drug use: No  . Sexual activity: Not on file  Other Topics Concern  . Not on file  Social History Narrative   Right handed    Live alone   Caffeine use: 1 cup coffee every morning   Social Determinants of  Health   Financial Resource Strain:   . Difficulty of Paying Living Expenses: Not on file  Food Insecurity:   . Worried About Charity fundraiser in the Last Year: Not on file  . Ran Out of Food in the Last Year: Not on file  Transportation Needs:   . Lack of Transportation (Medical): Not on file  . Lack of Transportation (Non-Medical): Not on file  Physical Activity:   . Days of Exercise per Week: Not on file  . Minutes of Exercise per Session: Not on file  Stress:   . Feeling of Stress : Not on file  Social Connections:   . Frequency of Communication with Friends and Family: Not on file  . Frequency of Social Gatherings with Friends and Family: Not on file  . Attends Religious Services: Not on file  . Active Member of Clubs or Organizations: Not on file  . Attends Archivist Meetings: Not on file  . Marital Status: Not on file  Intimate Partner Violence:   . Fear of Current or Ex-Partner: Not on file  . Emotionally Abused: Not on file  . Physically Abused: Not on file  . Sexually Abused: Not on file    FAMILY HISTORY: Family History  Problem Relation Age of Onset  . Heart disease Father   . Rheum arthritis Father   . Heart failure Mother   . Diabetes Brother     ALLERGIES:  is allergic to other and codeine.  MEDICATIONS:  Current Outpatient Medications  Medication Sig Dispense Refill  . allopurinol (ZYLOPRIM) 300 MG tablet Take 1 tablet (300 mg total) by mouth daily. 90 tablet 1  . ALPRAZolam (XANAX) 0.25 MG tablet Take 0.25 mg by mouth 2 (two) times daily.     Marland Kitchen amLODipine (NORVASC) 5 MG tablet Take 5 mg by mouth daily.    Marland Kitchen aspirin EC 81 MG tablet Take 81 mg by mouth at bedtime.    Marland Kitchen atorvastatin (LIPITOR) 20 MG tablet Take 20 mg by mouth at bedtime.  11  . budesonide-formoterol (SYMBICORT) 160-4.5 MCG/ACT inhaler Inhale 2 puffs into the lungs 2 (two) times daily as needed (for shortness of breath).     . Cholecalciferol (VITAMIN D3) 2000 UNITS TABS Take  2,000 Units by mouth at bedtime.     . fluticasone (FLONASE) 50 MCG/ACT nasal spray Place 1-2 sprays into both nostrils daily as needed for rhinitis.   11  . gabapentin (NEURONTIN) 100 MG capsule TAKE 1 CAP EVERY MORNING, 1 CAP IN THE AFTERNOON, AND 2 CAPS EVERY DAY AT BEDTIME (Patient taking differently: Take 100-200 mg by mouth See admin instructions. Take 100mg  by mouth every morning, take 100mg  by mouth in the afternoon, and take 200mg  by  mouth at bedtime) 360 capsule 2  . hydroxypropyl methylcellulose / hypromellose (ISOPTO TEARS / GONIOVISC) 2.5 % ophthalmic solution Place 1-2 drops into both eyes 3 (three) times daily as needed (to soothe dry eyes.).     Marland Kitchen levothyroxine (SYNTHROID, LEVOTHROID) 75 MCG tablet Take 1 tablet (75 mcg total) by mouth daily before breakfast. 30 tablet 1  . lidocaine-prilocaine (EMLA) cream Apply 1 application topically as needed. 30 g 0  . metoprolol tartrate (LOPRESSOR) 25 MG tablet Take 0.5 tablets (12.5 mg total) by mouth 2 (two) times daily. (Patient taking differently: Take 12.5 mg by mouth daily. ) 60 tablet 1  . mirtazapine (REMERON) 15 MG tablet Take 15 mg by mouth at bedtime.     . ondansetron (ZOFRAN ODT) 4 MG disintegrating tablet Take 1 tablet (4 mg total) by mouth every 8 (eight) hours as needed for nausea or vomiting. 20 tablet 0  . ondansetron (ZOFRAN) 8 MG tablet Take 1 tablet (8 mg total) by mouth every 8 (eight) hours as needed for nausea or vomiting. 30 tablet 0  . pantoprazole (PROTONIX) 40 MG tablet Take 40 mg by mouth 2 (two) times daily.   11  . Potassium 99 MG TABS Take 99 mg by mouth at bedtime.    . predniSONE (DELTASONE) 20 MG tablet Take 3 tablets (60 mg total) by mouth daily with breakfast. Take 3 tablets daily x 5 days on days 1-5 of chemotherapy. Chemo is every 21 days. Start next cycle on 12/05/19 15 tablet 3  . prochlorperazine (COMPAZINE) 10 MG tablet Take 1 tablet (10 mg total) by mouth every 6 (six) hours as needed for nausea or  vomiting. 30 tablet 0  . traMADol (ULTRAM) 50 MG tablet Take 50 mg by mouth 2 (two) times daily as needed for moderate pain.     . vitamin B-12 (CYANOCOBALAMIN) 1000 MCG tablet Take 1,000 mcg by mouth daily.     No current facility-administered medications for this visit.    REVIEW OF SYSTEMS:   Constitutional: ( - ) fevers, ( - )  chills , ( - ) night sweats Eyes: ( - ) blurriness of vision, ( - ) double vision, ( - ) watery eyes Ears, nose, mouth, throat, and face: ( - ) mucositis, ( - ) sore throat Respiratory: ( - ) cough, ( - ) dyspnea, ( - ) wheezes Cardiovascular: ( - ) palpitation, ( - ) chest discomfort, ( - ) lower extremity swelling Gastrointestinal:  ( - ) nausea, ( - ) heartburn, ( - ) change in bowel habits Skin: ( - ) abnormal skin rashes Lymphatics: ( - ) new lymphadenopathy, ( - ) easy bruising Neurological: ( - ) numbness, ( - ) tingling, ( - ) new weaknesses Behavioral/Psych: ( - ) mood change, ( - ) new changes  All other systems were reviewed with the patient and are negative.  PHYSICAL EXAMINATION: ECOG PERFORMANCE STATUS: 2 - Symptomatic, <50% confined to bed  There were no vitals filed for this visit. There were no vitals filed for this visit.  GENERAL: well appearing elderly Caucasian female in NAD  SKIN: skin color, texture, turgor are normal, no rashes or significant lesions EYES: conjunctiva are pink and non-injected, sclera clear LUNGS: clear to auscultation and percussion with normal breathing effort HEART: regular rate & rhythm and no murmurs and no lower extremity edema Musculoskeletal: no cyanosis of digits and no clubbing  PSYCH: alert & oriented x 3, fluent speech NEURO: no focal motor/sensory deficits  LABORATORY DATA:  I have reviewed the data as listed CBC Latest Ref Rng & Units 11/24/2019 11/17/2019 11/13/2019  WBC 4.0 - 10.5 K/uL 17.5(H) 46.2(H) 10.4  Hemoglobin 12.0 - 15.0 g/dL 11.5(L) 12.0 11.3(L)  Hematocrit 36 - 46 % 36.0 36.5 35.9(L)   Platelets 150 - 400 K/uL 217 156 228    CMP Latest Ref Rng & Units 11/24/2019 11/17/2019 11/13/2019  Glucose 70 - 99 mg/dL 92 90 108(H)  BUN 8 - 23 mg/dL <4(L) 13 5(L)  Creatinine 0.44 - 1.00 mg/dL 0.77 0.71 0.68  Sodium 135 - 145 mmol/L 140 139 138  Potassium 3.5 - 5.1 mmol/L 3.5 3.1(L) 3.9  Chloride 98 - 111 mmol/L 101 98 101  CO2 22 - 32 mmol/L 33(H) 34(H) 34(H)  Calcium 8.9 - 10.3 mg/dL 8.7(L) 8.9 8.6(L)  Total Protein 6.5 - 8.1 g/dL 7.4 7.2 7.2  Total Bilirubin 0.3 - 1.2 mg/dL 0.3 1.4(H) 0.8  Alkaline Phos 38 - 126 U/L 152(H) 138(H) 120  AST 15 - 41 U/L 38 23 24  ALT 0 - 44 U/L 34 19 11   RADIOGRAPHIC STUDIES: I have personally reviewed the radiological images as listed and agreed with the findings in the report: hypermetabolic right tonsillar mass.   IR IMAGING GUIDED PORT INSERTION  Result Date: 11/05/2019 INDICATION: 84 year old female with diffuse large B-cell lymphoma. She presents for port catheter placement. EXAM: IMPLANTED PORT A CATH PLACEMENT WITH ULTRASOUND AND FLUOROSCOPIC GUIDANCE MEDICATIONS: 2 g Ancef; The antibiotic was administered within an appropriate time interval prior to skin puncture. ANESTHESIA/SEDATION: Versed 1 mg IV; Fentanyl 50 mcg IV; Moderate Sedation Time:  14 minutes The patient was continuously monitored during the procedure by the interventional radiology nurse under my direct supervision. FLUOROSCOPY TIME:  0 minutes, 12 seconds (1 mGy) COMPLICATIONS: None immediate. PROCEDURE: The right neck and chest was prepped with chlorhexidine, and draped in the usual sterile fashion using maximum barrier technique (cap and mask, sterile gown, sterile gloves, large sterile sheet, hand hygiene and cutaneous antiseptic). Local anesthesia was attained by infiltration with 1% lidocaine with epinephrine. Ultrasound demonstrated patency of the right internal jugular vein, and this was documented with an image. Under real-time ultrasound guidance, this vein was accessed  with a 21 gauge micropuncture needle and image documentation was performed. A small dermatotomy was made at the access site with an 11 scalpel. A 0.018" wire was advanced into the SVC and the access needle exchanged for a 53F micropuncture vascular sheath. The 0.018" wire was then removed and a 0.035" wire advanced into the IVC. An appropriate location for the subcutaneous reservoir was selected below the clavicle and an incision was made through the skin and underlying soft tissues. The subcutaneous tissues were then dissected using a combination of blunt and sharp surgical technique and a pocket was formed. A single lumen power injectable portacatheter was then tunneled through the subcutaneous tissues from the pocket to the dermatotomy and the port reservoir placed within the subcutaneous pocket. The venous access site was then serially dilated and a peel away vascular sheath placed over the wire. The wire was removed and the port catheter advanced into position under fluoroscopic guidance. The catheter tip is positioned in the superior cavoatrial junction. This was documented with a spot image. The portacatheter was then tested and found to flush and aspirate well. The port was flushed with saline followed by 100 units/mL heparinized saline. The pocket was then closed in two layers using first subdermal inverted interrupted absorbable sutures followed  by a running subcuticular suture. The epidermis was then sealed with Dermabond. The dermatotomy at the venous access site was also closed with Dermabond. IMPRESSION: Successful placement of a right IJ approach Power Port with ultrasound and fluoroscopic guidance. The catheter is ready for use. Electronically Signed   By: Jacqulynn Cadet M.D.   On: 11/05/2019 14:43    ASSESSMENT & PLAN Amy [name] 84 y.o. female with medical history significant for Stage II DLBCL of the tonsil who presents for a follow up visit.  After review the labs, the records, discussion  with the patient the findings most consistent with a stage II diffuse large B-cell lymphoma of the tonsil.  She has involvement of the right tonsil as well as axillary lymph nodes which would constitute stage II disease.  She does have some mild activity in other lymph nodes throughout the body, but nowhere near as active as the ones within her head neck.  Lymphomas involvement of these lymph nodes is not likely and therefore I would proceed with treatment as if the patient were a stage II.  On exam today Mrs. Clovia Cuff notes tolerated cycle 1 chemotherapy well with only some mild hair loss and nausea.  Her labs look stable from prior.  She is willing and able to proceed with treatment today.  Previously we discussed the diagnosis of diffuse large B-cell lymphoma and the treatment options moving forward.  The regimen most recommended for this patient would be R mini CHOP with consideration for 3 cycles followed by radiation to the local lymph nodes.  This is consistent with the treatment course to be recommended with R-CHOP chemotherapy for stage II disease.  We are still currently waiting for the results of the double hit panel, however this would not likely change management as the patient would not be able to tolerate full strength chemotherapy with something like R-EPOCH.  We also discussed the risks and benefits of this R-CHOP chemotherapy including constipation, neurotoxicity, cardiac toxicity, drop in blood counts, fatigue, nausea, vomiting, and alopecia.  The patient voiced her understanding of these complications and wished to proceed forward with treatment.  The regimen of R- miniCHOP consists of rituximab 325 mg per metered squared, cyclophosphamide 400 mg per metered squared IV, doxorubicin 25 mg per metered squared IV, vincristine 1 mg IV, and prednisone 40 mg per metered squared p.o. on days 1 through 5.  This will be pursued for 3 cycles of chemotherapy with the addition of radiation therapy.   Based on NCCN recommendations, member institutes do practice this regimen, though data is not robust.  # Diffuse Large B Cell Lymphoma, Stage II --complete staging with a PET CT scan, findings most consistent with a Stage II DLBCL of the tonsil.  --today is Cycle 2 Day 1 of R-mini-CHOP  --awaiting results from [facility] for [place], BCL2 and BCL6. This would not change initial management as I would not pursue R-EPOCH or intensive chemotherapy at her age.  -- TTE shows EF 60-65% with mild diastolic dysfunction --s/p port placement and chemotherapy education --tentatively plan for 3 cycles followed by local radiation.  --RTC in 3 weeks for next cycle R-miniCHOP with interval weekly lab visits.   #Supportive Care --chemotherapy education completed --zofran 8mg  q8H PRN and compazine 10mg  PO q6H for nausea -- allopurinol 300mg  PO daily for TLS prophylaxis -- EMLA cream for port -- no pain medication required at this time.   No orders of the defined types were placed in this encounter.  All questions were answered. The patient knows to call the clinic with any problems, questions or concerns.  A total of more than 30 minutes were spent on this encounter and over half of that time was spent on counseling and coordination of care as outlined above.   Ledell Peoples, MD Department of Hematology/Oncology [place] at [facility] Phone: [phone] Pager: [phone] Email: [email or web address]  12/04/2019 9:03 PM

## 2019-12-05 ENCOUNTER — Other Ambulatory Visit: Payer: Self-pay

## 2019-12-05 ENCOUNTER — Encounter: Payer: Self-pay | Admitting: Hematology and Oncology

## 2019-12-05 ENCOUNTER — Other Ambulatory Visit: Payer: Medicare Other

## 2019-12-05 ENCOUNTER — Inpatient Hospital Stay: Payer: Medicare Other

## 2019-12-05 ENCOUNTER — Inpatient Hospital Stay: Payer: Medicare Other | Admitting: Hematology and Oncology

## 2019-12-05 ENCOUNTER — Ambulatory Visit: Payer: Medicare Other

## 2019-12-05 VITALS — BP 108/54 | HR 71 | Temp 97.6°F | Resp 16

## 2019-12-05 VITALS — BP 127/70 | HR 79 | Temp 97.8°F | Resp 17 | Ht 65.0 in | Wt 132.8 lb

## 2019-12-05 DIAGNOSIS — Z5111 Encounter for antineoplastic chemotherapy: Secondary | ICD-10-CM | POA: Diagnosis not present

## 2019-12-05 DIAGNOSIS — C8331 Diffuse large B-cell lymphoma, lymph nodes of head, face, and neck: Secondary | ICD-10-CM

## 2019-12-05 DIAGNOSIS — Z5112 Encounter for antineoplastic immunotherapy: Secondary | ICD-10-CM | POA: Diagnosis not present

## 2019-12-05 DIAGNOSIS — Z95828 Presence of other vascular implants and grafts: Secondary | ICD-10-CM | POA: Insufficient documentation

## 2019-12-05 LAB — CMP (CANCER CENTER ONLY)
ALT: 16 U/L (ref 0–44)
AST: 27 U/L (ref 15–41)
Albumin: 3.5 g/dL (ref 3.5–5.0)
Alkaline Phosphatase: 120 U/L (ref 38–126)
Anion gap: 8 (ref 5–15)
BUN: 10 mg/dL (ref 8–23)
CO2: 26 mmol/L (ref 22–32)
Calcium: 9.2 mg/dL (ref 8.9–10.3)
Chloride: 105 mmol/L (ref 98–111)
Creatinine: 0.77 mg/dL (ref 0.44–1.00)
GFR, Estimated: >60 mL/min (ref >60)
Glucose, Bld: 113 mg/dL — ABNORMAL HIGH (ref 70–99)
Potassium: 4.3 mmol/L (ref 3.5–5.1)
Sodium: 139 mmol/L (ref 135–145)
Total Bilirubin: 0.3 mg/dL (ref 0.3–1.2)
Total Protein: 7.4 g/dL (ref 6.5–8.1)

## 2019-12-05 LAB — CBC WITH DIFFERENTIAL (CANCER CENTER ONLY)
Abs Immature Granulocytes: 0.02 10*3/uL (ref 0.00–0.07)
Basophils Absolute: 0.1 10*3/uL (ref 0.0–0.1)
Basophils Relative: 1 %
Eosinophils Absolute: 0 10*3/uL (ref 0.0–0.5)
Eosinophils Relative: 0 %
HCT: 34.3 % — ABNORMAL LOW (ref 36.0–46.0)
Hemoglobin: 11.5 g/dL — ABNORMAL LOW (ref 12.0–15.0)
Immature Granulocytes: 0 %
Lymphocytes Relative: 35 %
Lymphs Abs: 2.5 10*3/uL (ref 0.7–4.0)
MCH: 32 pg (ref 26.0–34.0)
MCHC: 33.5 g/dL (ref 30.0–36.0)
MCV: 95.5 fL (ref 80.0–100.0)
Monocytes Absolute: 0.2 10*3/uL (ref 0.1–1.0)
Monocytes Relative: 2 %
Neutro Abs: 4.3 10*3/uL (ref 1.7–7.7)
Neutrophils Relative %: 62 %
Platelet Count: 348 10*3/uL (ref 150–400)
RBC: 3.59 MIL/uL — ABNORMAL LOW (ref 3.87–5.11)
RDW: 14.3 % (ref 11.5–15.5)
WBC Count: 7.1 10*3/uL (ref 4.0–10.5)
nRBC: 0 % (ref 0.0–0.2)

## 2019-12-05 LAB — LACTATE DEHYDROGENASE: LDH: 172 U/L (ref 98–192)

## 2019-12-05 MED ORDER — ACETAMINOPHEN 325 MG PO TABS
650.0000 mg | ORAL_TABLET | Freq: Once | ORAL | Status: AC
  Administered 2019-12-05: 650 mg via ORAL

## 2019-12-05 MED ORDER — SODIUM CHLORIDE 0.9% FLUSH
10.0000 mL | INTRAVENOUS | Status: DC | PRN
  Administered 2019-12-05: 10 mL
  Filled 2019-12-05: qty 10

## 2019-12-05 MED ORDER — SODIUM CHLORIDE 0.9 % IV SOLN
400.0000 mg/m2 | Freq: Once | INTRAVENOUS | Status: AC
  Administered 2019-12-05: 660 mg via INTRAVENOUS
  Filled 2019-12-05: qty 33

## 2019-12-05 MED ORDER — VINCRISTINE SULFATE CHEMO INJECTION 1 MG/ML
1.0000 mg | Freq: Once | INTRAVENOUS | Status: AC
  Administered 2019-12-05: 1 mg via INTRAVENOUS
  Filled 2019-12-05: qty 1

## 2019-12-05 MED ORDER — ACETAMINOPHEN 325 MG PO TABS
ORAL_TABLET | ORAL | Status: AC
  Filled 2019-12-05: qty 2

## 2019-12-05 MED ORDER — SODIUM CHLORIDE 0.9 % IV SOLN: 375.0000 mg/m2 | Freq: Once | INTRAVENOUS | Status: DC

## 2019-12-05 MED ORDER — PALONOSETRON HCL INJECTION 0.25 MG/5ML
INTRAVENOUS | Status: AC
  Filled 2019-12-05: qty 5

## 2019-12-05 MED ORDER — SODIUM CHLORIDE 0.9% FLUSH
10.0000 mL | Freq: Once | INTRAVENOUS | Status: AC
  Administered 2019-12-05: 10 mL
  Filled 2019-12-05: qty 10

## 2019-12-05 MED ORDER — SODIUM CHLORIDE 0.9 % IV SOLN
Freq: Once | INTRAVENOUS | Status: AC
  Filled 2019-12-05: qty 250

## 2019-12-05 MED ORDER — PALONOSETRON HCL INJECTION 0.25 MG/5ML
0.2500 mg | Freq: Once | INTRAVENOUS | Status: AC
  Administered 2019-12-05: 0.25 mg via INTRAVENOUS

## 2019-12-05 MED ORDER — SODIUM CHLORIDE 0.9 % IV SOLN
10.0000 mg | Freq: Once | INTRAVENOUS | Status: AC
  Administered 2019-12-05: 10 mg via INTRAVENOUS
  Filled 2019-12-05: qty 10

## 2019-12-05 MED ORDER — HEPARIN SOD (PORK) LOCK FLUSH 100 UNIT/ML IV SOLN
500.0000 [IU] | Freq: Once | INTRAVENOUS | Status: AC | PRN
  Administered 2019-12-05: 500 [IU]
  Filled 2019-12-05: qty 5

## 2019-12-05 MED ORDER — DIPHENHYDRAMINE HCL 25 MG PO CAPS
50.0000 mg | ORAL_CAPSULE | Freq: Once | ORAL | Status: AC
  Administered 2019-12-05: 50 mg via ORAL

## 2019-12-05 MED ORDER — SODIUM CHLORIDE 0.9 % IV SOLN
375.0000 mg/m2 | Freq: Once | INTRAVENOUS | Status: AC
  Administered 2019-12-05: 600 mg via INTRAVENOUS
  Filled 2019-12-05: qty 50

## 2019-12-05 MED ORDER — SODIUM CHLORIDE 0.9 % IV SOLN
150.0000 mg | Freq: Once | INTRAVENOUS | Status: AC
  Administered 2019-12-05: 150 mg via INTRAVENOUS
  Filled 2019-12-05: qty 150

## 2019-12-05 MED ORDER — DIPHENHYDRAMINE HCL 25 MG PO CAPS
ORAL_CAPSULE | ORAL | Status: AC
  Filled 2019-12-05: qty 2

## 2019-12-05 MED ORDER — DOXORUBICIN HCL CHEMO IV INJECTION 2 MG/ML
25.0000 mg/m2 | Freq: Once | INTRAVENOUS | Status: AC
  Administered 2019-12-05: 42 mg via INTRAVENOUS
  Filled 2019-12-05: qty 21

## 2019-12-05 NOTE — Patient Instructions (Signed)
[facility] Discharge Instructions for Patients Receiving Chemotherapy  Today you received the following chemotherapy agents: doxorubicin, vincristine, cyclophosphamide, and rituximab.  To help prevent nausea and vomiting after your treatment, we encourage you to take your nausea medication as directed.   If you develop nausea and vomiting that is not controlled by your nausea medication, call the clinic.   BELOW ARE SYMPTOMS THAT SHOULD BE REPORTED IMMEDIATELY:  *FEVER GREATER THAN 100.5 F  *CHILLS WITH OR WITHOUT FEVER  NAUSEA AND VOMITING THAT IS NOT CONTROLLED WITH YOUR NAUSEA MEDICATION  *UNUSUAL SHORTNESS OF BREATH  *UNUSUAL BRUISING OR BLEEDING  TENDERNESS IN MOUTH AND THROAT WITH OR WITHOUT PRESENCE OF ULCERS  *URINARY PROBLEMS  *BOWEL PROBLEMS  UNUSUAL RASH Items with * indicate a potential emergency and should be followed up as soon as possible.  Feel free to call the clinic should you have any questions or concerns. The clinic phone number is [phone].  Please show the [place] at check-in to the Emergency Department and triage nurse.

## 2019-12-05 NOTE — Progress Notes (Signed)
.   Rapid Infusion Rituximab Pharmacist Evaluation  Amy [name] is a 84 y.o. female being treated with rituximab for NHL. This patient may be considered for RIR.   A pharmacist has verified the patient tolerated rituximab infusions per the [facility] standard infusion protocol without grade 3-4 infusion reactions. The treatment plan will be updated to reflect RIR if the patient qualifies per the checklist below:   Age > 53 years old Yes   Clinically significant cardiovascular disease No   Circulating lymphocyte count < 5000/uL prior to cycle two Yes  Lab Results  Component Value Date   LYMPHSABS 2.5 12/05/2019    Prior documented grade 3-4 infusion reaction to rituximab No   Prior documented grade 1-2 infusion reaction to rituximab (If YES, Pharmacist will confirm with Physician if patient is still a candidate for RIR) No   Previous rituximab infusion within the past 6 months No   Treatment Plan updated orders to reflect RIR Yes    Amy [name] does meet the criteria for Rapid Infusion Rituximab. This patient is going to be switched to rapid infusion rituximab.   [name] 12/05/19 9:34 AM

## 2019-12-05 NOTE — Progress Notes (Signed)
Patient stable at time of discharge. 

## 2019-12-08 ENCOUNTER — Other Ambulatory Visit: Payer: Self-pay

## 2019-12-08 ENCOUNTER — Inpatient Hospital Stay: Payer: Medicare Other

## 2019-12-08 ENCOUNTER — Telehealth: Payer: Self-pay | Admitting: Hematology and Oncology

## 2019-12-08 ENCOUNTER — Telehealth: Payer: Self-pay | Admitting: *Deleted

## 2019-12-08 VITALS — BP 147/72 | HR 94 | Temp 98.0°F | Resp 18

## 2019-12-08 DIAGNOSIS — C8331 Diffuse large B-cell lymphoma, lymph nodes of head, face, and neck: Secondary | ICD-10-CM

## 2019-12-08 DIAGNOSIS — Z5112 Encounter for antineoplastic immunotherapy: Secondary | ICD-10-CM | POA: Diagnosis not present

## 2019-12-08 MED ORDER — PEGFILGRASTIM-CBQV 6 MG/0.6ML ~~LOC~~ SOSY
6.0000 mg | PREFILLED_SYRINGE | Freq: Once | SUBCUTANEOUS | Status: AC
  Administered 2019-12-08: 6 mg via SUBCUTANEOUS

## 2019-12-08 MED ORDER — PEGFILGRASTIM-CBQV 6 MG/0.6ML ~~LOC~~ SOSY
PREFILLED_SYRINGE | SUBCUTANEOUS | Status: AC
  Filled 2019-12-08: qty 0.6

## 2019-12-08 NOTE — Patient Instructions (Signed)

## 2019-12-08 NOTE — Telephone Encounter (Signed)
Scheduled per 11/26 los. Called and spoke with pt, confirmed 11/29 appt. Pt will receive an updated appt calendar at visit

## 2019-12-10 ENCOUNTER — Other Ambulatory Visit: Payer: Self-pay | Admitting: *Deleted

## 2019-12-10 ENCOUNTER — Telehealth: Payer: Self-pay | Admitting: *Deleted

## 2019-12-10 DIAGNOSIS — C8331 Diffuse large B-cell lymphoma, lymph nodes of head, face, and neck: Secondary | ICD-10-CM

## 2019-12-10 NOTE — Telephone Encounter (Signed)
Amy [name] states she thinks she has a UTI, noticed that her urine is dark. Has history of frequent UTIs.

## 2019-12-10 NOTE — Telephone Encounter (Signed)
LM Dr Lorenso Courier wants her to come in to give urine specimen.  Message to scheduler, orders placed

## 2019-12-11 ENCOUNTER — Other Ambulatory Visit: Payer: Self-pay

## 2019-12-11 ENCOUNTER — Inpatient Hospital Stay: Payer: Medicare Other

## 2019-12-11 ENCOUNTER — Inpatient Hospital Stay: Payer: Medicare Other | Attending: Hematology and Oncology

## 2019-12-11 ENCOUNTER — Telehealth: Payer: Self-pay | Admitting: *Deleted

## 2019-12-11 VITALS — BP 136/58 | HR 76 | Temp 98.1°F | Resp 18

## 2019-12-11 DIAGNOSIS — Z90710 Acquired absence of both cervix and uterus: Secondary | ICD-10-CM | POA: Insufficient documentation

## 2019-12-11 DIAGNOSIS — Z7982 Long term (current) use of aspirin: Secondary | ICD-10-CM | POA: Insufficient documentation

## 2019-12-11 DIAGNOSIS — Z5112 Encounter for antineoplastic immunotherapy: Secondary | ICD-10-CM | POA: Diagnosis present

## 2019-12-11 DIAGNOSIS — Z7952 Long term (current) use of systemic steroids: Secondary | ICD-10-CM | POA: Insufficient documentation

## 2019-12-11 DIAGNOSIS — Z5111 Encounter for antineoplastic chemotherapy: Secondary | ICD-10-CM | POA: Diagnosis present

## 2019-12-11 DIAGNOSIS — E039 Hypothyroidism, unspecified: Secondary | ICD-10-CM | POA: Diagnosis not present

## 2019-12-11 DIAGNOSIS — Z79899 Other long term (current) drug therapy: Secondary | ICD-10-CM | POA: Diagnosis not present

## 2019-12-11 DIAGNOSIS — Z833 Family history of diabetes mellitus: Secondary | ICD-10-CM | POA: Diagnosis not present

## 2019-12-11 DIAGNOSIS — C8331 Diffuse large B-cell lymphoma, lymph nodes of head, face, and neck: Secondary | ICD-10-CM

## 2019-12-11 DIAGNOSIS — Z8261 Family history of arthritis: Secondary | ICD-10-CM | POA: Insufficient documentation

## 2019-12-11 DIAGNOSIS — Z87891 Personal history of nicotine dependence: Secondary | ICD-10-CM | POA: Diagnosis not present

## 2019-12-11 DIAGNOSIS — Z8249 Family history of ischemic heart disease and other diseases of the circulatory system: Secondary | ICD-10-CM | POA: Insufficient documentation

## 2019-12-11 DIAGNOSIS — F419 Anxiety disorder, unspecified: Secondary | ICD-10-CM | POA: Diagnosis not present

## 2019-12-11 DIAGNOSIS — Z7951 Long term (current) use of inhaled steroids: Secondary | ICD-10-CM | POA: Insufficient documentation

## 2019-12-11 DIAGNOSIS — E785 Hyperlipidemia, unspecified: Secondary | ICD-10-CM | POA: Diagnosis not present

## 2019-12-11 DIAGNOSIS — J449 Chronic obstructive pulmonary disease, unspecified: Secondary | ICD-10-CM | POA: Diagnosis not present

## 2019-12-11 DIAGNOSIS — I4891 Unspecified atrial fibrillation: Secondary | ICD-10-CM | POA: Insufficient documentation

## 2019-12-11 DIAGNOSIS — Z95828 Presence of other vascular implants and grafts: Secondary | ICD-10-CM

## 2019-12-11 DIAGNOSIS — K219 Gastro-esophageal reflux disease without esophagitis: Secondary | ICD-10-CM | POA: Diagnosis not present

## 2019-12-11 LAB — URINALYSIS, COMPLETE (UACMP) WITH MICROSCOPIC
Bilirubin Urine: NEGATIVE
Glucose, UA: NEGATIVE mg/dL
Ketones, ur: NEGATIVE mg/dL
Nitrite: NEGATIVE
Protein, ur: NEGATIVE mg/dL
Specific Gravity, Urine: 1.013 (ref 1.005–1.030)
pH: 7 (ref 5.0–8.0)

## 2019-12-11 LAB — CBC WITH DIFFERENTIAL (CANCER CENTER ONLY)
Abs Immature Granulocytes: 0 10*3/uL (ref 0.00–0.07)
Band Neutrophils: 6 %
Basophils Absolute: 0 10*3/uL (ref 0.0–0.1)
Basophils Relative: 0 %
Eosinophils Absolute: 0 10*3/uL (ref 0.0–0.5)
Eosinophils Relative: 0 %
HCT: 31.7 % — ABNORMAL LOW (ref 36.0–46.0)
Hemoglobin: 10.6 g/dL — ABNORMAL LOW (ref 12.0–15.0)
Lymphocytes Relative: 4 %
Lymphs Abs: 2.3 10*3/uL (ref 0.7–4.0)
MCH: 32.3 pg (ref 26.0–34.0)
MCHC: 33.4 g/dL (ref 30.0–36.0)
MCV: 96.6 fL (ref 80.0–100.0)
Monocytes Absolute: 0 10*3/uL — ABNORMAL LOW (ref 0.1–1.0)
Monocytes Relative: 0 %
Neutro Abs: 54.3 10*3/uL — ABNORMAL HIGH (ref 1.7–7.7)
Neutrophils Relative %: 90 %
Platelet Count: 199 10*3/uL (ref 150–400)
RBC: 3.28 MIL/uL — ABNORMAL LOW (ref 3.87–5.11)
RDW: 14.7 % (ref 11.5–15.5)
WBC Count: 56.6 10*3/uL (ref 4.0–10.5)
nRBC: 0 % (ref 0.0–0.2)

## 2019-12-11 LAB — CMP (CANCER CENTER ONLY)
ALT: 15 U/L (ref 0–44)
AST: 17 U/L (ref 15–41)
Albumin: 3.4 g/dL — ABNORMAL LOW (ref 3.5–5.0)
Alkaline Phosphatase: 152 U/L — ABNORMAL HIGH (ref 38–126)
Anion gap: 10 (ref 5–15)
BUN: 11 mg/dL (ref 8–23)
CO2: 32 mmol/L (ref 22–32)
Calcium: 9.1 mg/dL (ref 8.9–10.3)
Chloride: 98 mmol/L (ref 98–111)
Creatinine: 0.64 mg/dL (ref 0.44–1.00)
GFR, Estimated: >60 mL/min (ref >60)
Glucose, Bld: 104 mg/dL — ABNORMAL HIGH (ref 70–99)
Potassium: 3 mmol/L — ABNORMAL LOW (ref 3.5–5.1)
Sodium: 140 mmol/L (ref 135–145)
Total Bilirubin: 1.2 mg/dL (ref 0.3–1.2)
Total Protein: 6.4 g/dL — ABNORMAL LOW (ref 6.5–8.1)

## 2019-12-11 LAB — LACTATE DEHYDROGENASE: LDH: 298 U/L — ABNORMAL HIGH (ref 98–192)

## 2019-12-11 MED ORDER — HEPARIN SOD (PORK) LOCK FLUSH 100 UNIT/ML IV SOLN
500.0000 [IU] | Freq: Once | INTRAVENOUS | Status: AC
  Administered 2019-12-11: 500 [IU]
  Filled 2019-12-11: qty 5

## 2019-12-11 MED ORDER — SODIUM CHLORIDE 0.9% FLUSH
10.0000 mL | Freq: Once | INTRAVENOUS | Status: AC
  Administered 2019-12-11: 10 mL
  Filled 2019-12-11: qty 10

## 2019-12-11 NOTE — Progress Notes (Signed)
.  CRITICAL VALUE STICKER  CRITICAL VALUE:WBC 56.6  RECEIVER (on-site recipient of call):S.Gabrielly Mccrystal lpn  DATE & TIME NOTIFIED: 12/11/19 @0907   MESSENGER (representative from lab):Rosann Auerbach  MD NOTIFIED: Dr. Lorenso Courier via [place]  RESPONSE: MD aware

## 2019-12-11 NOTE — Telephone Encounter (Signed)
CRITICAL VALUE STICKER  CRITICAL VALUE: WBC 56.6  RECEIVER (on-site recipient of call): Bevely Palmer., LPN DATE & TIME NOTIFIED: 12/11/2019 @ 09:10  MESSENGER (representative from lab): Rosann Auerbach  MD NOTIFIED: Dr. Lorenso Courier  TIME OF NOTIFICATION: 09:12  RESPONSE:  Pending  Last treatment 12/05/2019 and Udenyca recd on 12/08/2019.  UA and Urine Culture also pending.

## 2019-12-11 NOTE — Telephone Encounter (Signed)
No action on critical lab per Dr Lorenso Courier.    Patient called yesterday stating she felt like she might have a UTI, that explains reason for testing.

## 2019-12-13 LAB — URINE CULTURE: Culture: <10000 — AB

## 2019-12-15 ENCOUNTER — Telehealth: Payer: Self-pay | Admitting: *Deleted

## 2019-12-15 NOTE — Telephone Encounter (Signed)
TCT patient regarding u/a results. No answer on either home phone or mobile phone. VM message left for pt to return this call at her convenience.

## 2019-12-15 NOTE — Telephone Encounter (Signed)
-----   Message from Orson Slick, MD sent at 12/15/2019  9:18 AM EST ----- Please let Amy [name] know that based on her urine studies there is no clear evidence of a UTI. It is possible that if she is having urinary symptoms it is related to her chemotherapy. We will see her soon for her next cycle of chemotherapy.   ----- Message ----- From: Interface, Lab In [place] Sent: 12/11/2019   9:00 AM EST To: Orson Slick, MD

## 2019-12-17 ENCOUNTER — Other Ambulatory Visit: Payer: Self-pay | Admitting: Hematology and Oncology

## 2019-12-17 DIAGNOSIS — C8331 Diffuse large B-cell lymphoma, lymph nodes of head, face, and neck: Secondary | ICD-10-CM

## 2019-12-18 ENCOUNTER — Inpatient Hospital Stay: Payer: Medicare Other

## 2019-12-18 ENCOUNTER — Other Ambulatory Visit: Payer: Self-pay

## 2019-12-18 DIAGNOSIS — C8331 Diffuse large B-cell lymphoma, lymph nodes of head, face, and neck: Secondary | ICD-10-CM

## 2019-12-18 DIAGNOSIS — Z95828 Presence of other vascular implants and grafts: Secondary | ICD-10-CM

## 2019-12-18 DIAGNOSIS — Z5112 Encounter for antineoplastic immunotherapy: Secondary | ICD-10-CM | POA: Diagnosis not present

## 2019-12-18 LAB — CBC WITH DIFFERENTIAL (CANCER CENTER ONLY)
Abs Immature Granulocytes: 1.73 10*3/uL — ABNORMAL HIGH (ref 0.00–0.07)
Basophils Absolute: 0.1 10*3/uL (ref 0.0–0.1)
Basophils Relative: 1 %
Eosinophils Absolute: 0.3 10*3/uL (ref 0.0–0.5)
Eosinophils Relative: 2 %
HCT: 31.3 % — ABNORMAL LOW (ref 36.0–46.0)
Hemoglobin: 10.1 g/dL — ABNORMAL LOW (ref 12.0–15.0)
Immature Granulocytes: 10 %
Lymphocytes Relative: 16 %
Lymphs Abs: 2.8 10*3/uL (ref 0.7–4.0)
MCH: 32.1 pg (ref 26.0–34.0)
MCHC: 32.3 g/dL (ref 30.0–36.0)
MCV: 99.4 fL (ref 80.0–100.0)
Monocytes Absolute: 1.1 10*3/uL — ABNORMAL HIGH (ref 0.1–1.0)
Monocytes Relative: 6 %
Neutro Abs: 11.7 10*3/uL — ABNORMAL HIGH (ref 1.7–7.7)
Neutrophils Relative %: 65 %
Platelet Count: 122 10*3/uL — ABNORMAL LOW (ref 150–400)
RBC: 3.15 MIL/uL — ABNORMAL LOW (ref 3.87–5.11)
RDW: 15.7 % — ABNORMAL HIGH (ref 11.5–15.5)
WBC Count: 17.7 10*3/uL — ABNORMAL HIGH (ref 4.0–10.5)
nRBC: 0.5 % — ABNORMAL HIGH (ref 0.0–0.2)

## 2019-12-18 LAB — CMP (CANCER CENTER ONLY)
ALT: 18 U/L (ref 0–44)
AST: 25 U/L (ref 15–41)
Albumin: 3.3 g/dL — ABNORMAL LOW (ref 3.5–5.0)
Alkaline Phosphatase: 151 U/L — ABNORMAL HIGH (ref 38–126)
Anion gap: 9 (ref 5–15)
BUN: 7 mg/dL — ABNORMAL LOW (ref 8–23)
CO2: 29 mmol/L (ref 22–32)
Calcium: 8.7 mg/dL — ABNORMAL LOW (ref 8.9–10.3)
Chloride: 102 mmol/L (ref 98–111)
Creatinine: 0.72 mg/dL (ref 0.44–1.00)
GFR, Estimated: >60 mL/min (ref >60)
Glucose, Bld: 93 mg/dL (ref 70–99)
Potassium: 4.1 mmol/L (ref 3.5–5.1)
Sodium: 140 mmol/L (ref 135–145)
Total Bilirubin: 0.3 mg/dL (ref 0.3–1.2)
Total Protein: 6.5 g/dL (ref 6.5–8.1)

## 2019-12-18 LAB — LACTATE DEHYDROGENASE: LDH: 291 U/L — ABNORMAL HIGH (ref 98–192)

## 2019-12-18 MED ORDER — SODIUM CHLORIDE 0.9% FLUSH
10.0000 mL | Freq: Once | INTRAVENOUS | Status: AC
  Administered 2019-12-18: 10 mL
  Filled 2019-12-18: qty 10

## 2019-12-18 MED ORDER — HEPARIN SOD (PORK) LOCK FLUSH 100 UNIT/ML IV SOLN
500.0000 [IU] | Freq: Once | INTRAVENOUS | Status: AC
  Administered 2019-12-18: 500 [IU]
  Filled 2019-12-18: qty 5

## 2019-12-24 ENCOUNTER — Other Ambulatory Visit: Payer: Self-pay | Admitting: Hematology and Oncology

## 2019-12-24 DIAGNOSIS — C8331 Diffuse large B-cell lymphoma, lymph nodes of head, face, and neck: Secondary | ICD-10-CM

## 2019-12-25 ENCOUNTER — Inpatient Hospital Stay: Payer: Medicare Other

## 2019-12-25 ENCOUNTER — Inpatient Hospital Stay (HOSPITAL_BASED_OUTPATIENT_CLINIC_OR_DEPARTMENT_OTHER): Payer: Medicare Other | Admitting: Hematology and Oncology

## 2019-12-25 ENCOUNTER — Other Ambulatory Visit: Payer: Self-pay

## 2019-12-25 VITALS — BP 115/52 | HR 75 | Temp 97.5°F | Resp 17

## 2019-12-25 VITALS — BP 136/75 | HR 80 | Temp 97.9°F | Resp 16 | Ht 65.0 in | Wt 130.7 lb

## 2019-12-25 DIAGNOSIS — C8331 Diffuse large B-cell lymphoma, lymph nodes of head, face, and neck: Secondary | ICD-10-CM

## 2019-12-25 DIAGNOSIS — Z95828 Presence of other vascular implants and grafts: Secondary | ICD-10-CM | POA: Diagnosis not present

## 2019-12-25 DIAGNOSIS — Z5112 Encounter for antineoplastic immunotherapy: Secondary | ICD-10-CM | POA: Diagnosis not present

## 2019-12-25 DIAGNOSIS — Z5111 Encounter for antineoplastic chemotherapy: Secondary | ICD-10-CM | POA: Diagnosis not present

## 2019-12-25 LAB — CBC WITH DIFFERENTIAL (CANCER CENTER ONLY)
Abs Immature Granulocytes: 0.06 10*3/uL (ref 0.00–0.07)
Basophils Absolute: 0.1 10*3/uL (ref 0.0–0.1)
Basophils Relative: 1 %
Eosinophils Absolute: 0.1 10*3/uL (ref 0.0–0.5)
Eosinophils Relative: 1 %
HCT: 32 % — ABNORMAL LOW (ref 36.0–46.0)
Hemoglobin: 10.4 g/dL — ABNORMAL LOW (ref 12.0–15.0)
Immature Granulocytes: 1 %
Lymphocytes Relative: 25 %
Lymphs Abs: 1.8 10*3/uL (ref 0.7–4.0)
MCH: 32.4 pg (ref 26.0–34.0)
MCHC: 32.5 g/dL (ref 30.0–36.0)
MCV: 99.7 fL (ref 80.0–100.0)
Monocytes Absolute: 0.1 10*3/uL (ref 0.1–1.0)
Monocytes Relative: 2 %
Neutro Abs: 5 10*3/uL (ref 1.7–7.7)
Neutrophils Relative %: 70 %
Platelet Count: 342 10*3/uL (ref 150–400)
RBC: 3.21 MIL/uL — ABNORMAL LOW (ref 3.87–5.11)
RDW: 16 % — ABNORMAL HIGH (ref 11.5–15.5)
WBC Count: 7.1 10*3/uL (ref 4.0–10.5)
nRBC: 0 % (ref 0.0–0.2)

## 2019-12-25 LAB — CMP (CANCER CENTER ONLY)
ALT: 18 U/L (ref 0–44)
AST: 25 U/L (ref 15–41)
Albumin: 3.4 g/dL — ABNORMAL LOW (ref 3.5–5.0)
Alkaline Phosphatase: 131 U/L — ABNORMAL HIGH (ref 38–126)
Anion gap: 6 (ref 5–15)
BUN: 8 mg/dL (ref 8–23)
CO2: 30 mmol/L (ref 22–32)
Calcium: 9.3 mg/dL (ref 8.9–10.3)
Chloride: 104 mmol/L (ref 98–111)
Creatinine: 0.72 mg/dL (ref 0.44–1.00)
GFR, Estimated: >60 mL/min (ref >60)
Glucose, Bld: 114 mg/dL — ABNORMAL HIGH (ref 70–99)
Potassium: 4.1 mmol/L (ref 3.5–5.1)
Sodium: 140 mmol/L (ref 135–145)
Total Bilirubin: 0.3 mg/dL (ref 0.3–1.2)
Total Protein: 6.8 g/dL (ref 6.5–8.1)

## 2019-12-25 LAB — LACTATE DEHYDROGENASE: LDH: 222 U/L — ABNORMAL HIGH (ref 98–192)

## 2019-12-25 MED ORDER — VINCRISTINE SULFATE CHEMO INJECTION 1 MG/ML
1.0000 mg | Freq: Once | INTRAVENOUS | Status: AC
  Administered 2019-12-25: 14:00:00 1 mg via INTRAVENOUS
  Filled 2019-12-25: qty 1

## 2019-12-25 MED ORDER — ACETAMINOPHEN 325 MG PO TABS
ORAL_TABLET | ORAL | Status: AC
  Filled 2019-12-25: qty 2

## 2019-12-25 MED ORDER — SODIUM CHLORIDE 0.9% FLUSH
10.0000 mL | Freq: Once | INTRAVENOUS | Status: AC
  Administered 2019-12-25: 11:00:00 10 mL
  Filled 2019-12-25: qty 10

## 2019-12-25 MED ORDER — PALONOSETRON HCL INJECTION 0.25 MG/5ML
0.2500 mg | Freq: Once | INTRAVENOUS | Status: AC
  Administered 2019-12-25: 12:00:00 0.25 mg via INTRAVENOUS

## 2019-12-25 MED ORDER — SODIUM CHLORIDE 0.9 % IV SOLN
375.0000 mg/m2 | Freq: Once | INTRAVENOUS | Status: AC
  Administered 2019-12-25: 15:00:00 600 mg via INTRAVENOUS
  Filled 2019-12-25: qty 50

## 2019-12-25 MED ORDER — DOXORUBICIN HCL CHEMO IV INJECTION 2 MG/ML
25.0000 mg/m2 | Freq: Once | INTRAVENOUS | Status: AC
  Administered 2019-12-25: 14:00:00 42 mg via INTRAVENOUS
  Filled 2019-12-25: qty 21

## 2019-12-25 MED ORDER — DIPHENHYDRAMINE HCL 25 MG PO CAPS
ORAL_CAPSULE | ORAL | Status: AC
  Filled 2019-12-25: qty 2

## 2019-12-25 MED ORDER — SODIUM CHLORIDE 0.9 % IV SOLN
150.0000 mg | Freq: Once | INTRAVENOUS | Status: AC
  Administered 2019-12-25: 13:00:00 150 mg via INTRAVENOUS
  Filled 2019-12-25: qty 150

## 2019-12-25 MED ORDER — SODIUM CHLORIDE 0.9% FLUSH
10.0000 mL | INTRAVENOUS | Status: DC | PRN
  Administered 2019-12-25: 17:00:00 10 mL
  Filled 2019-12-25: qty 10

## 2019-12-25 MED ORDER — ACETAMINOPHEN 325 MG PO TABS
650.0000 mg | ORAL_TABLET | Freq: Once | ORAL | Status: AC
  Administered 2019-12-25: 12:00:00 650 mg via ORAL

## 2019-12-25 MED ORDER — SODIUM CHLORIDE 0.9 % IV SOLN
400.0000 mg/m2 | Freq: Once | INTRAVENOUS | Status: AC
  Administered 2019-12-25: 14:00:00 660 mg via INTRAVENOUS
  Filled 2019-12-25: qty 33

## 2019-12-25 MED ORDER — DIPHENHYDRAMINE HCL 25 MG PO CAPS
50.0000 mg | ORAL_CAPSULE | Freq: Once | ORAL | Status: AC
  Administered 2019-12-25: 12:00:00 50 mg via ORAL

## 2019-12-25 MED ORDER — SODIUM CHLORIDE 0.9 % IV SOLN
10.0000 mg | Freq: Once | INTRAVENOUS | Status: AC
  Administered 2019-12-25: 12:00:00 10 mg via INTRAVENOUS
  Filled 2019-12-25: qty 10

## 2019-12-25 MED ORDER — HEPARIN SOD (PORK) LOCK FLUSH 100 UNIT/ML IV SOLN
500.0000 [IU] | Freq: Once | INTRAVENOUS | Status: AC | PRN
  Administered 2019-12-25: 17:00:00 500 [IU]
  Filled 2019-12-25: qty 5

## 2019-12-25 MED ORDER — PALONOSETRON HCL INJECTION 0.25 MG/5ML
INTRAVENOUS | Status: AC
  Filled 2019-12-25: qty 5

## 2019-12-25 MED ORDER — SODIUM CHLORIDE 0.9 % IV SOLN
Freq: Once | INTRAVENOUS | Status: AC
  Filled 2019-12-25: qty 250

## 2019-12-25 NOTE — Progress Notes (Signed)
[place] [phone]   [phone]  PROGRESS NOTE  Patient Care Team: Amie Critchley as PCP - General (Family Medicine)  Hematological/Oncological History # Diffuse Large B Cell Lymphoma, Stage II 1) 09/26/2019: CT scan of neck performed due to persistent tonsillar lesion after outpatient antibiotic course. CT scan showed a heterogeneous right tonsillar mass up to 3.2 cm and a small but suspicious right level 2A lymph node  2) 09/30/2019: patient underwent biopsy with ENT which revealed a DLBCL with FISH for MYC, BCL2 and BCL6 pending 3) 10/08/2019: establish care with Dr. Lorenso Courier  4) 10/21/2019: PET scan showed hypermetabolic right tonsillar mass and adjacent right level 2 adenopathy, consistent with the given history of lymphoma. 5) 11/13/2019: Cycle 1 Day 1 of R-miniCHOP 6) 12/05/2019: Cycle 2 Day 1 of R-miniCHOP 7) 12/25/2019: Cycle 3 Day 1 of R-miniCHOP  Interval History:  Jackelyn Poling 84 y.o. female with medical history significant for Stage II DLBCL of the tonsil who presents for a follow up visit. The patient's last visit was on 12/05/2019. In the interim since the last visit she has completed Cycle 2 of R-miniCHOP. Today is Cycle 3 Day 1.   On exam today Mrs. Denunzio reports that she tolerated cycle 2 quite well.  She notes she has had a little more fatigue and unfortunately has lost all of her hair.  She notes the hair was starting to come out in large quantities and so she had her hairdresser simply remove the hair.  She notes that she has also been a little bit nauseated but not had any vomiting as when she begins to feel nauseous she takes one of her antinausea medications.  She notes that her bowels have been moving well and her appetite has been "so-so".  She notes that she is been trying to eat snacks throughout the day rather than 1 large meal at mealtime.  Otherwise she has tolerated treatment well.  She denies any fevers, chills,  sweats, vomiting, or diarrhea.  A full 10 point ROS is listed below.  MEDICAL HISTORY:  Past Medical History:  Diagnosis Date   Anxiety    Arthritis    Atrial fibrillation (HCC)    COPD (chronic obstructive pulmonary disease) (HCC)    Esophageal stricture    s/p repeated dilations, Dr. Watt Climes   GERD (gastroesophageal reflux disease)    Hearing loss bilateral   has hearing aids   Heart murmur    Hiatal hernia    History of blood transfusion    many years ago    Hyperlipidemia    Hypothyroidism    Osteoarthritis    Osteoporosis    Pulmonary nodules    Renal lesion    1cm left kidney    SURGICAL HISTORY: Past Surgical History:  Procedure Laterality Date   ABDOMINAL HYSTERECTOMY     BREAST ENHANCEMENT SURGERY  2006   CATARACT EXTRACTION W/ INTRAOCULAR LENS  IMPLANT, BILATERAL  '09   ESOPHAGEAL DILATION     IR IMAGING GUIDED PORT INSERTION  11/05/2019   KNEE ARTHROSCOPY  02/22/2011   Procedure: ARTHROSCOPY KNEE;  Surgeon: Gearlean Alf, MD;  [facility]: [facility];  Service: Orthopedics;  Laterality: Right;  WITH DEBRIDEMENT    LAPAROSCOPIC SMALL BOWEL RESECTION N/A 06/02/2016   Procedure: DIAGNOSTIC LAPAROSCOPY SMALL BOWEL RESECTION;  Surgeon: Armandina Gemma, MD;  Location: [facility];  Service: General;  Laterality: N/A;    SOCIAL HISTORY: Social History   Socioeconomic History  Marital status: Widowed    Spouse name: Not on file   Number of children: 3   Years of education: 48   Highest education level: Not on file  Occupational History   Not on file  Tobacco Use   Smoking status: Former Smoker    Packs/day: 1.00    Years: 30.00    Pack years: 30.00    Types: Cigarettes    Quit date: 01/09/1997    Years since quitting: 22.9   Smokeless tobacco: Never Used  Vaping Use   Vaping Use: Never used  Substance and Sexual Activity   Alcohol use: No   Drug use: No   Sexual activity: Not on file  Other Topics Concern    Not on file  Social History Narrative   Right handed    Live alone   Caffeine use: 1 cup coffee every morning   Social Determinants of Health   Financial Resource Strain: Not on file  Food Insecurity: Not on file  Transportation Needs: Not on file  Physical Activity: Not on file  Stress: Not on file  Social Connections: Not on file  Intimate Partner Violence: Not on file    FAMILY HISTORY: Family History  Problem Relation Age of Onset   Heart disease Father    Rheum arthritis Father    Heart failure Mother    Diabetes Brother     ALLERGIES:  is allergic to other and codeine.  MEDICATIONS:  Current Outpatient Medications  Medication Sig Dispense Refill   allopurinol (ZYLOPRIM) 300 MG tablet Take 1 tablet (300 mg total) by mouth daily. 90 tablet 1   ALPRAZolam (XANAX) 0.25 MG tablet Take 0.25 mg by mouth 2 (two) times daily.      amLODipine (NORVASC) 5 MG tablet Take 5 mg by mouth daily.     aspirin EC 81 MG tablet Take 81 mg by mouth at bedtime.     atorvastatin (LIPITOR) 20 MG tablet Take 20 mg by mouth at bedtime.  11   budesonide-formoterol (SYMBICORT) 160-4.5 MCG/ACT inhaler Inhale 2 puffs into the lungs 2 (two) times daily as needed (for shortness of breath).      Cholecalciferol (VITAMIN D3) 2000 UNITS TABS Take 2,000 Units by mouth at bedtime.      fluticasone (FLONASE) 50 MCG/ACT nasal spray Place 1-2 sprays into both nostrils daily as needed for rhinitis.   11   gabapentin (NEURONTIN) 100 MG capsule TAKE 1 CAP EVERY MORNING, 1 CAP IN THE AFTERNOON, AND 2 CAPS EVERY DAY AT BEDTIME (Patient taking differently: Take 100-200 mg by mouth See admin instructions. Take 100mg  by mouth every morning, take 100mg  by mouth in the afternoon, and take 200mg  by mouth at bedtime) 360 capsule 2   hydroxypropyl methylcellulose / hypromellose (ISOPTO TEARS / GONIOVISC) 2.5 % ophthalmic solution Place 1-2 drops into both eyes 3 (three) times daily as needed (to soothe dry  eyes.).      levothyroxine (SYNTHROID, LEVOTHROID) 75 MCG tablet Take 1 tablet (75 mcg total) by mouth daily before breakfast. 30 tablet 1   lidocaine-prilocaine (EMLA) cream Apply 1 application topically as needed. 30 g 0   metoprolol tartrate (LOPRESSOR) 25 MG tablet Take 0.5 tablets (12.5 mg total) by mouth 2 (two) times daily. (Patient taking differently: Take 12.5 mg by mouth daily. ) 60 tablet 1   mirtazapine (REMERON) 15 MG tablet Take 15 mg by mouth at bedtime.      ondansetron (ZOFRAN ODT) 4 MG disintegrating tablet Take 1 tablet (  4 mg total) by mouth every 8 (eight) hours as needed for nausea or vomiting. 20 tablet 0   ondansetron (ZOFRAN) 8 MG tablet Take 1 tablet (8 mg total) by mouth every 8 (eight) hours as needed for nausea or vomiting. 30 tablet 0   pantoprazole (PROTONIX) 40 MG tablet Take 40 mg by mouth 2 (two) times daily.   11   Potassium 99 MG TABS Take 99 mg by mouth at bedtime.     predniSONE (DELTASONE) 20 MG tablet Take 3 tablets (60 mg total) by mouth daily with breakfast. Take 3 tablets daily x 5 days on days 1-5 of chemotherapy. Chemo is every 21 days. Start next cycle on 12/05/19 15 tablet 3   prochlorperazine (COMPAZINE) 10 MG tablet Take 1 tablet (10 mg total) by mouth every 6 (six) hours as needed for nausea or vomiting. 30 tablet 0   traMADol (ULTRAM) 50 MG tablet Take 50 mg by mouth 2 (two) times daily as needed for moderate pain.      vitamin B-12 (CYANOCOBALAMIN) 1000 MCG tablet Take 1,000 mcg by mouth daily.     No current facility-administered medications for this visit.   Facility-Administered Medications Ordered in Other Visits  Medication Dose Route Frequency Provider Last Rate Last Admin   heparin lock flush 100 unit/mL  500 Units Intracatheter Once PRN Ledell Peoples IV, MD       sodium chloride flush (NS) 0.9 % injection 10 mL  10 mL Intracatheter PRN Orson Slick, MD        REVIEW OF SYSTEMS:   Constitutional: ( - ) fevers, ( - )   chills , ( - ) night sweats Eyes: ( - ) blurriness of vision, ( - ) double vision, ( - ) watery eyes Ears, nose, mouth, throat, and face: ( - ) mucositis, ( - ) sore throat Respiratory: ( - ) cough, ( - ) dyspnea, ( - ) wheezes Cardiovascular: ( - ) palpitation, ( - ) chest discomfort, ( - ) lower extremity swelling Gastrointestinal:  ( - ) nausea, ( - ) heartburn, ( - ) change in bowel habits Skin: ( - ) abnormal skin rashes Lymphatics: ( - ) new lymphadenopathy, ( - ) easy bruising Neurological: ( - ) numbness, ( - ) tingling, ( - ) new weaknesses Behavioral/Psych: ( - ) mood change, ( - ) new changes  All other systems were reviewed with the patient and are negative.  PHYSICAL EXAMINATION: ECOG PERFORMANCE STATUS: 2 - Symptomatic, <50% confined to bed  Vitals:   12/25/19 1127  BP: 136/75  Pulse: 80  Resp: 16  Temp: 97.9 F (36.6 C)  SpO2: 93%   Filed Weights   12/25/19 1127  Weight: 130 lb 11.2 oz (59.3 kg)    GENERAL: well appearing elderly Caucasian female in NAD  SKIN: skin color, texture, turgor are normal, no rashes or significant lesions EYES: conjunctiva are pink and non-injected, sclera clear LUNGS: clear to auscultation and percussion with normal breathing effort HEART: regular rate & rhythm and no murmurs and no lower extremity edema Musculoskeletal: no cyanosis of digits and no clubbing  PSYCH: alert & oriented x 3, fluent speech NEURO: no focal motor/sensory deficits  LABORATORY DATA:  I have reviewed the data as listed CBC Latest Ref Rng & Units 12/25/2019 12/18/2019 12/11/2019  WBC 4.0 - 10.5 K/uL 7.1 17.7(H) 56.6(HH)  Hemoglobin 12.0 - 15.0 g/dL 10.4(L) 10.1(L) 10.6(L)  Hematocrit 36.0 - 46.0 % 32.0(L) 31.3(L) 31.7(L)  Platelets 150 - 400 K/uL 342 122(L) 199    CMP Latest Ref Rng & Units 12/25/2019 12/18/2019 12/11/2019  Glucose 70 - 99 mg/dL 114(H) 93 104(H)  BUN 8 - 23 mg/dL 8 7(L) 11  Creatinine 0.44 - 1.00 mg/dL 0.72 0.72 0.64  Sodium 135 - 145  mmol/L 140 140 140  Potassium 3.5 - 5.1 mmol/L 4.1 4.1 3.0(L)  Chloride 98 - 111 mmol/L 104 102 98  CO2 22 - 32 mmol/L 30 29 32  Calcium 8.9 - 10.3 mg/dL 9.3 8.7(L) 9.1  Total Protein 6.5 - 8.1 g/dL 6.8 6.5 6.4(L)  Total Bilirubin 0.3 - 1.2 mg/dL 0.3 0.3 1.2  Alkaline Phos 38 - 126 U/L 131(H) 151(H) 152(H)  AST 15 - 41 U/L 25 25 17   ALT 0 - 44 U/L 18 18 15    RADIOGRAPHIC STUDIES: I have personally reviewed the radiological images as listed and agreed with the findings in the report: hypermetabolic right tonsillar mass.   No results found.  ASSESSMENT & PLAN CADIENCE BRADFIELD 84 y.o. female with medical history significant for Stage II DLBCL of the tonsil who presents for a follow up visit.  After review the labs, the records, discussion with the patient the findings most consistent with a stage II diffuse large B-cell lymphoma of the tonsil.  She has involvement of the right tonsil as well as axillary lymph nodes which would constitute stage II disease.  She does have some mild activity in other lymph nodes throughout the body, but nowhere near as active as the ones within her head neck.  Lymphomas involvement of these lymph nodes is not likely and therefore I would proceed with treatment as if the patient were a stage II.  On exam today Mrs. Clovia Cuff notes tolerated cycle 2 chemotherapy well with only some fatigue and nausea (without vomiting).  Her labs look stable from prior.  She is willing and able to proceed with treatment today.  Previously we discussed the diagnosis of diffuse large B-cell lymphoma and the treatment options moving forward.  The regimen most recommended for this patient would be R mini CHOP with consideration for 3 cycles followed by radiation to the local lymph nodes.  This is consistent with the treatment course to be recommended with R-CHOP chemotherapy for stage II disease.  We are still currently waiting for the results of the double hit panel, however this would not  likely change management as the patient would not be able to tolerate full strength chemotherapy with something like R-EPOCH.  We also discussed the risks and benefits of this R-CHOP chemotherapy including constipation, neurotoxicity, cardiac toxicity, drop in blood counts, fatigue, nausea, vomiting, and alopecia.  The patient voiced her understanding of these complications and wished to proceed forward with treatment.  The regimen of R- miniCHOP consists of rituximab 325 mg per metered squared, cyclophosphamide 400 mg per metered squared IV, doxorubicin 25 mg per metered squared IV, vincristine 1 mg IV, and prednisone 40 mg per metered squared p.o. on days 1 through 5.  This will be pursued for 3 cycles of chemotherapy with the addition of radiation therapy.  Based on NCCN recommendations, member institutes do practice this regimen (short course chemo + RT), though data is not robust.  # Diffuse Large B Cell Lymphoma, Stage II --complete staging with a PET CT scan, findings most consistent with a Stage II DLBCL of the tonsil.  --today is Cycle 3 Day 1 of R-mini-CHOP  --awaiting results from [facility] for  MYC, BCL2 and BCL6. This would not change initial management as I would not pursue R-EPOCH or intensive chemotherapy at her age.  -- TTE shows EF 60-65% with mild diastolic dysfunction --s/p port placement and chemotherapy education --tentatively plan for 3 cycles followed by localized radiation.  --RTC in 3 weeks with interval PET CT scan.   #Supportive Care --chemotherapy education completed --zofran 8mg  q8H PRN and compazine 10mg  PO q6H for nausea -- allopurinol 300mg  PO daily for TLS prophylaxis -- EMLA cream for port -- no pain medication required at this time.   Orders Placed This Encounter  Procedures   NM PET Image Restag (PS) Skull Base To Thigh    Standing Status:   Future    Standing Expiration Date:   12/24/2020    Order Specific Question:   If indicated for the ordered procedure, I  authorize the administration of a radiopharmaceutical per Radiology protocol    Answer:   Yes    Order Specific Question:   Preferred imaging location?    Answer:   [place]   Ambulatory referral to Radiation Oncology    Referral Priority:   Routine    Referral Type:   Consultation    Referral Reason:   Specialty Services Required    Requested Specialty:   Radiation Oncology    Number of Visits Requested:   1   All questions were answered. The patient knows to call the clinic with any problems, questions or concerns.  A total of more than 30 minutes were spent on this encounter and over half of that time was spent on counseling and coordination of care as outlined above.   Ledell Peoples, MD Department of Hematology/Oncology [place] at [facility] Phone: [phone] Pager: [phone] Email: [email or web address]  12/25/2019 3:30 PM

## 2019-12-25 NOTE — Patient Instructions (Signed)
[facility] Discharge Instructions for Patients Receiving Chemotherapy  Today you received the following chemotherapy agents: doxorubicin, vincristine, cyclophosphamide, and rituximab.  To help prevent nausea and vomiting after your treatment, we encourage you to take your nausea medication as directed.   If you develop nausea and vomiting that is not controlled by your nausea medication, call the clinic.   BELOW ARE SYMPTOMS THAT SHOULD BE REPORTED IMMEDIATELY:  *FEVER GREATER THAN 100.5 F  *CHILLS WITH OR WITHOUT FEVER  NAUSEA AND VOMITING THAT IS NOT CONTROLLED WITH YOUR NAUSEA MEDICATION  *UNUSUAL SHORTNESS OF BREATH  *UNUSUAL BRUISING OR BLEEDING  TENDERNESS IN MOUTH AND THROAT WITH OR WITHOUT PRESENCE OF ULCERS  *URINARY PROBLEMS  *BOWEL PROBLEMS  UNUSUAL RASH Items with * indicate a potential emergency and should be followed up as soon as possible.  Feel free to call the clinic should you have any questions or concerns. The clinic phone number is [phone].  Please show the [place] at check-in to the Emergency Department and triage nurse.

## 2019-12-25 NOTE — Patient Instructions (Signed)

## 2020-01-07 NOTE — Progress Notes (Signed)
Oncology Nurse Navigator Documentation  Placed introductory call to new referral patient Amy [name]  Introduced myself as the H&N oncology nurse navigator that works with Dr. Basilio Cairo to whom he has been referred by Dr. Leonides Schanz. She confirmed understanding of referral.  Briefly explained my role as his navigator, provided my contact information.   Confirmed understanding of upcoming appts and [facility] location, explained arrival and registration process.   I encouraged her to call with questions/concerns as she moves forward with appts and procedures.    She verbalized understanding of information provided, expressed appreciation for my call.   Navigator Initial Assessment . Employment Status: She is retired . Currently on FMLA / STD: no . Living Situation: She lives alone . Support System: Daughter and son-in-law . PCP: . PCD: . Financial Concerns:no . Transportation Needs: no . Sensory Deficits:no . Language Barriers/Interpreter Needed:  no . Ambulation Needs: no . DME Used in Home: no . Psychosocial Needs:  no . Concerns/Needs Understanding Cancer:  addressed/answered by navigator to best of ability . Self-Expressed Needs: no   Hedda Slade RN, BSN, OCN Head & Neck Oncology Nurse Navigator [facility] Phone # [phone]  Fax # [phone]

## 2020-01-08 ENCOUNTER — Other Ambulatory Visit: Payer: Self-pay

## 2020-01-08 ENCOUNTER — Ambulatory Visit (HOSPITAL_COMMUNITY)
Admission: RE | Admit: 2020-01-08 | Discharge: 2020-01-08 | Disposition: A | Discharge status: Home / Self Care | Payer: Medicare Other | Source: Ambulatory Visit | Attending: Hematology and Oncology | Admitting: Hematology and Oncology

## 2020-01-08 DIAGNOSIS — C8331 Diffuse large B-cell lymphoma, lymph nodes of head, face, and neck: Secondary | ICD-10-CM | POA: Diagnosis present

## 2020-01-08 LAB — GLUCOSE, CAPILLARY: Glucose-Capillary: 104 mg/dL — ABNORMAL HIGH (ref 70–99)

## 2020-01-08 MED ORDER — FLUDEOXYGLUCOSE F - 18 (FDG) INJECTION
6.4700 | Freq: Once | INTRAVENOUS | Status: AC
  Administered 2020-01-08: 10:00:00 6.47 via INTRAVENOUS

## 2020-01-12 ENCOUNTER — Telehealth: Payer: Self-pay | Admitting: Hematology and Oncology

## 2020-01-12 NOTE — Progress Notes (Signed)
Lymphoma Location(s) / Histology:  Stage II Diffuse Large B-Cell Lymphoma of the RIGHT tonsil   Amy [name] presented ~4 months ago with symptoms of: "her throat did not feel right".  She notes that he stuck her finger in the back of her mouth and felt that she had a large lesion that abutted her uvula.  She came to her PCP with these concerns and he prescribed her with amoxicillin for approximately 10 days.  She notes that these pills did "no good" so she underwent her CT scan on 09/26/2019 which showed:  IMPRESSION: 1. 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 Small but suspicious right level 2A lymph node (series 3, image 48). Recommend ENT consultation. 2. No other mass or lymphadenopathy in the neck. Asymmetric left axillary lymph nodes are noted although still normal by size criteria. Query ipsilateral COVID-19 vaccination.  Biopsies revealed:  09/29/2019 Final Pathologic Diagnosis   RIGHT TONSIL, BIOPSY: Large B-cell lymphoma, non-germinal center subtype. (See comment). Comment   --Sections demonstrate an atypical submucosal lymphoid infiltrate composed of medium to large sized cells with irregular nuclear contours, vesicular chromatin and scant cytoplasm.  Focal necrotic areas are also present. The underlying epithelium shows reactive changes.  Appropriately controlled immunohistochemical stains are performed.  The atypical cells are B-cells expressing CD20, BCL2 (dim), BCL6 and MUM1.  They are negative for CD5, CD10, CD30 (<1%), MYC (<40%), cyclin D1, and in-situ hybridization for EBV-encoded RNA (EBER).  The proliferation index is high (80%), as demonstrated by Ki-67.  No distinguishable follicular architecture is appreciable by CD21. Scattered small T-cells are shown by CD3.  Stains for p40 and p16 are negative. --The overall findings are consistent with  involvement by a large B-cell lymphoma, with non-germinal center phenotype (non-GC type) and increased proliferation index.  FISH for MYC, BCL2 and BCL6 rearrangements has been ordered and will be reported separately.  Correlation with clinical findings is recommended.  Recurrent fevers, or drenching night sweats, if any: Patient denies  Nutrition Status Yes No Comments  Weight changes? '[]'  '[x]'  ~6 lb weight loss after initial diagnosis; Weight seems stable now  Swallowing concerns? '[]'  '[x]'  Had issues before treatment, but since completing chemotherapy she denies any difficulty/concerns  PEG? '[]'  '[x]'     Referrals Yes No Comments  Social Work? '[]'  '[x]'    Dentistry? '[]'  '[x]'  Wears upper and lower denture  Swallowing therapy? '[]'  '[x]'    Nutrition? '[x]'  '[]'    Med/Onc? '[x]'  '[]'  Dr. Narda Rutherford   Safety Issues Yes No Comments  Prior radiation? '[]'  '[x]'    Pacemaker/ICD? '[]'  '[x]'    Possible current pregnancy? '[]'  '[x]'   Abdominal hysterectomy  Is the patient on methotrexate? '[]'  '[x]'     Tobacco/Marijuana/Snuff/ETOH use:  Former smoker (quit in 1999; smoked 1 pack/day for ~30 years). Denies any alcohol or recreational drug use  Past/Anticipated interventions by otolaryngology, if any: 09/29/2019 Dr. Melida Quitter Biopsy of right tonsil  Past/Anticipated interventions by medical oncology, if any:  Under care of Dr. Narda Rutherford 01/12/2020 Called Amy [name] to discuss the results of her PET CT scan from 01/08/2020.  Results look excellent and show a complete response to her 3 cycles of R mini CHOP chemotherapy.  The patient has a appointment scheduled with radiation oncology tomorrow to discuss adjuvant radiation therapy.  In the event she is deemed not to be a candidate for radiation or decides that she does not wish  to proceed with radiation we would consider additional rounds of chemotherapy as adjuvant therapy.  The patient voiced understanding of this plan moving forward --11/13/2019: Cycle 1 Day 1 of  R-miniCHOP --12/05/2019: Cycle 2 Day 1 of R-miniCHOP --12/25/2019: Cycle 3 Day 1 of R-miniCHOP   Current Complaints / other details:  Patient is hard of hearing and wears bilateral hearing aides. She has received both Moderna vaccines as well as the booster, and her yearly flu shot

## 2020-01-12 NOTE — Telephone Encounter (Signed)
Called Ms. Wehner to discuss the results of her PET CT scan from 01/08/2020.  Results look excellent and show a complete response to her 3 cycles of R mini CHOP chemotherapy.  The patient has a appointment scheduled with radiation oncology tomorrow to discuss adjuvant radiation therapy.  In the event she is deemed not to be a candidate for radiation or decides that she does not wish to proceed with radiation we would consider additional rounds of chemotherapy as adjuvant therapy.  The patient voiced understanding of this plan moving forward.  Ulysees Barns, MD Department of Hematology/Oncology [facility] at [facility] Phone: [phone] Pager: [phone] Email: [email or web address]

## 2020-01-13 ENCOUNTER — Ambulatory Visit
Admission: RE | Admit: 2020-01-13 | Discharge: 2020-01-13 | Disposition: A | Discharge status: Home / Self Care | Payer: Medicare Other | Source: Ambulatory Visit | Attending: Radiation Oncology | Admitting: Radiation Oncology

## 2020-01-13 ENCOUNTER — Other Ambulatory Visit: Payer: Self-pay

## 2020-01-13 ENCOUNTER — Encounter: Payer: Self-pay | Admitting: *Deleted

## 2020-01-13 ENCOUNTER — Encounter: Payer: Self-pay | Admitting: Radiation Oncology

## 2020-01-13 DIAGNOSIS — C8331 Diffuse large B-cell lymphoma, lymph nodes of head, face, and neck: Secondary | ICD-10-CM

## 2020-01-13 NOTE — Progress Notes (Signed)
Radiation Oncology         [phone] ________________________________  Initial Outpatient Consultation by telephone.  The patient opted for telemedicine to maximize safety during the pandemic.  MyChart video was not obtainable.   Name: Amy [name] MRN: [id number]  Date: 01/13/2020  DOB: Jul 01, 1935  [id number], Baldemar Friday., PA-C  Orson Slick, MD   REFERRING PHYSICIAN: Orson Slick, MD  DIAGNOSIS:    ICD-10-CM   1. 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 female who presented with persistent tonsillar lesion after outpatient antibiotic course.  Subsequently, the patient saw Dr. Redmond Baseman, who performed a right tonsillar biopsy on 09/29/2019. Results showed Diffuse Large B-cell Lymphoma.   Pertinent imaging thus far includes: 1. CT scan of neck performed on 09/26/2019 that showed a heterogeneous right tonsillar mass up to 3.2 cm and a small but suspicious right level 2A lymph node. 2. PET scan on 10/21/2019 that showed a hypermetabolic right tonsillar mass and adjacent right level 2 adenopathy, consistent with the given history of lymphoma. Additionally, there were mild hypermetabolic lymph nodes in the left neck, left axilla, pelvic retroperitoneum, and inguinal regions.  3. PET scan on 01/08/2020 that showed a complete metabolic response to therapy without residual adenopathy or hypermetabolism.  The patient underwent three cycles of chemotherapy with R-miniCHOP under the care of Dr. Lorenso Courier from 11/13/2019 - 12/25/2019.  It should be noted that she had some nonspecific adenopathy outside of the head and neck region on initial PET scan but ultimately Dr. Lorenso Courier staged her as Stage II.  I have personally reviewed her pretreatment and posttreatment imaging  Swallowing issues, if any: She  reports that she had pretreatment issues with swallowing but those have resolved  Weight Changes: She lost about 6 pounds after her diagnosis but weight is stable at present  She wears upper and lower dentures  Tobacco history, if any: Quit smoking cigarettes 23 years ago  ETOH abuse, if any: None  Prior cancers, if any: None  She has discussed her PET imaging with Dr. Lorenso Courier.  She does not wish to proceed with additional chemotherapy and therefore she elected to speak to me about radiation therapy  She is hard of hearing and wears bilateral hearing aids.  She has received her booster shot for Covid and her yearly flu shot.  Her son is present for the phone call today.  She has had a new cough for the past few days and therefore she is getting tested for Covid tomorrow via her PCP.  PREVIOUS RADIATION THERAPY: No  PAST MEDICAL HISTORY:  has a past medical history of Anxiety, Arthritis, Atrial fibrillation (HCC), COPD (chronic obstructive pulmonary disease) ([facility]), Esophageal stricture, GERD (gastroesophageal reflux disease), Hearing loss (bilateral), Heart murmur, Hiatal hernia, History of blood transfusion, Hyperlipidemia, Hypothyroidism, Osteoarthritis, Osteoporosis, Pulmonary nodules, and Renal lesion.    PAST SURGICAL HISTORY: Past Surgical History:  Procedure Laterality Date  . ABDOMINAL HYSTERECTOMY    . BREAST ENHANCEMENT SURGERY  2006  . CATARACT EXTRACTION W/ INTRAOCULAR LENS  IMPLANT, BILATERAL  '09  . ESOPHAGEAL DILATION    . IR IMAGING GUIDED PORT INSERTION  11/05/2019  . KNEE ARTHROSCOPY  02/22/2011   Procedure: ARTHROSCOPY KNEE;  Surgeon: Gearlean Alf, MD;  Location: [facility];  Service: Orthopedics;  Laterality: Right;  WITH DEBRIDEMENT   . LAPAROSCOPIC SMALL BOWEL RESECTION N/A 06/02/2016   Procedure: DIAGNOSTIC LAPAROSCOPY SMALL BOWEL RESECTION;  Surgeon: Armandina Gemma, MD;  Location: [facility];  Service: General;  Laterality: N/A;    FAMILY HISTORY:  family history includes Diabetes in her brother; Heart disease in her father; Heart failure in her mother; Rheum arthritis in her father.  SOCIAL HISTORY:  reports that she quit smoking about 23 years ago. Her smoking use included cigarettes. She has a 30.00 pack-year smoking history. She has never used smokeless tobacco. She reports that she does not drink alcohol and does not use drugs.  ALLERGIES: Other and Codeine  MEDICATIONS:  Current Outpatient Medications  Medication Sig Dispense Refill  . allopurinol (ZYLOPRIM) 300 MG tablet Take 1 tablet (300 mg total) by mouth daily. 90 tablet 1  . ALPRAZolam (XANAX) 0.25 MG tablet Take 0.25 mg by mouth 2 (two) times daily.    Marland Kitchen amLODipine (NORVASC) 5 MG tablet Take 5 mg by mouth daily.    Marland Kitchen aspirin EC 81 MG tablet Take 81 mg by mouth at bedtime.    Marland Kitchen atorvastatin (LIPITOR) 20 MG tablet Take 20 mg by mouth at bedtime.  11  . budesonide-formoterol (SYMBICORT) 160-4.5 MCG/ACT inhaler Inhale 2 puffs into the lungs 2 (two) times daily as needed (for shortness of breath).     . Cholecalciferol (VITAMIN D3) 2000 UNITS TABS Take 2,000 Units by mouth at bedtime.     . fluticasone (FLONASE) 50 MCG/ACT nasal spray Place 1-2 sprays into both nostrils daily as needed for rhinitis.   11  . gabapentin (NEURONTIN) 100 MG capsule TAKE 1 CAP EVERY MORNING, 1 CAP IN THE AFTERNOON, AND 2 CAPS EVERY DAY AT BEDTIME (Patient taking differently: Take 100-200 mg by mouth See admin instructions. Take 100mg  by mouth every morning, take 100mg  by mouth in the afternoon, and take 200mg  by mouth at bedtime) 360 capsule 2  . hydroxypropyl methylcellulose / hypromellose (ISOPTO TEARS / GONIOVISC) 2.5 % ophthalmic solution Place 1-2 drops into both eyes 3 (three) times daily as needed (to soothe dry eyes.).     Marland Kitchen levothyroxine (SYNTHROID) 88 MCG tablet Take 88 mcg by mouth daily.    Marland Kitchen lidocaine-prilocaine (EMLA) cream Apply 1 application topically as needed. 30 g 0  . metoprolol  tartrate (LOPRESSOR) 25 MG tablet Take 0.5 tablets (12.5 mg total) by mouth 2 (two) times daily. (Patient taking differently: Take 12.5 mg by mouth daily.) 60 tablet 1  . mirtazapine (REMERON) 15 MG tablet Take 15 mg by mouth at bedtime.     . pantoprazole (PROTONIX) 40 MG tablet Take 40 mg by mouth 2 (two) times daily.   11  . Potassium 99 MG TABS Take 99 mg by mouth at bedtime.    . traMADol (ULTRAM) 50 MG tablet Take 50 mg by mouth 2 (two) times daily as needed for moderate pain.     . vitamin B-12 (CYANOCOBALAMIN) 1000 MCG tablet Take 1,000 mcg by mouth daily.    . ondansetron (ZOFRAN ODT) 4 MG disintegrating tablet Take 1 tablet (4 mg total) by mouth every 8 (eight) hours as needed for nausea or vomiting. (Patient not taking: Reported on 01/13/2020) 20 tablet 0  . ondansetron (ZOFRAN) 8 MG tablet Take 1 tablet (8 mg total) by mouth every 8 (eight) hours as needed for nausea or vomiting. (Patient not taking: Reported on 01/13/2020) 30 tablet 0  . predniSONE (DELTASONE) 20 MG tablet Take 3 tablets (60  mg total) by mouth daily with breakfast. Take 3 tablets daily x 5 days on days 1-5 of chemotherapy. Chemo is every 21 days. Start next cycle on 12/05/19 (Patient not taking: Reported on 01/13/2020) 15 tablet 3  . prochlorperazine (COMPAZINE) 10 MG tablet Take 1 tablet (10 mg total) by mouth every 6 (six) hours as needed for nausea or vomiting. (Patient not taking: Reported on 01/13/2020) 30 tablet 0   No current facility-administered medications for this encounter.    REVIEW OF SYSTEMS:  Notable for that above.   PHYSICAL EXAM:  vitals were not taken for this visit.   General: Alert and oriented, in no acute distress      LABORATORY DATA:  Lab Results  Component Value Date   WBC 7.1 12/25/2019   HGB 10.4 (L) 12/25/2019   HCT 32.0 (L) 12/25/2019   MCV 99.7 12/25/2019   PLT 342 12/25/2019   CMP     Component Value Date/Time   NA 140 12/25/2019 1100   K 4.1 12/25/2019 1100   CL 104 12/25/2019  1100   CO2 30 12/25/2019 1100   GLUCOSE 114 (H) 12/25/2019 1100   BUN 8 12/25/2019 1100   CREATININE 0.72 12/25/2019 1100   CALCIUM 9.3 12/25/2019 1100   PROT 6.8 12/25/2019 1100   PROT 7.3 03/20/2018 1612   ALBUMIN 3.4 (L) 12/25/2019 1100   AST 25 12/25/2019 1100   ALT 18 12/25/2019 1100   ALKPHOS 131 (H) 12/25/2019 1100   BILITOT 0.3 12/25/2019 1100   GFRNONAA >60 12/25/2019 1100   GFRAA >60 10/08/2019 1509      Lab Results  Component Value Date   TSH 1.058 07/28/2018     RADIOGRAPHY: NM PET Image Restag (PS) Skull Base To Thigh  Result Date: 01/08/2020 CLINICAL DATA:  Subsequent treatment strategy for large B-cell lymphoma. EXAM: NUCLEAR MEDICINE PET SKULL BASE TO THIGH TECHNIQUE: 6.47 mCi F-18 FDG was injected intravenously. Full-ring PET imaging was performed from the skull base to thigh after the radiotracer. CT data was obtained and used for attenuation correction and anatomic localization. Fasting blood glucose: 104 mg/dl COMPARISON:  PET-CT 10/21/2019 FINDINGS: Mediastinal blood pool activity: SUV max 2.95 Liver activity: SUV max 3.15 NECK: Complete metabolic response involving the neck. The tonsillar mass and neck adenopathy have resolved. No residual hypermetabolism. Incidental CT findings: none CHEST: The small supraclavicular, axillary and mediastinal lymph nodes have resolved. No residual hypermetabolism. Incidental CT findings: none ABDOMEN/PELVIS: No enlarged or hypermetabolic abdominal or pelvic lymph nodes. Small external iliac node showed mild hypermetabolism on the prior study but not on today's study. Small right inguinal hypermetabolic node has also resolved. No hepatic or splenic lesions. Incidental CT findings: none SKELETON: No findings suspicious for osseous lymphoma. Incidental CT findings: none IMPRESSION: Complete metabolic response to therapy. No residual adenopathy or hypermetabolism ([place] [facility]). Electronically Signed   By: Marijo Sanes M.D.   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 I do recommend radiotherapy for this patient.  We discussed involved site radiation therapy which would take place over about 3 and half weeks to the throat and neck for local regional control  We discussed the potential risks, benefits, and side effects of radiotherapy. We talked in detail about acute and late effects. We discussed that some of the most bothersome acute effects may be mucositis, dysgeusia, salivary changes, skin irritation, fatigue.  There  is a risk of potential injury to any of the tissues in the head and neck region. No guarantees of treatment were given.  The patient is enthusiastic about proceeding with treatment. I look forward to participating in the patient's care.    Simulation (treatment planning) will take place in about 1-1/2 weeks in case she currently has a Covid infection.  We also discussed that the treatment of head and neck cancer is a multidisciplinary process to maximize treatment outcomes and quality of life. For this reason the following referrals have been or will be made: Nutritionist for nutrition support during and after treatment. Social work for social support.   This encounter was provided by telemedicine platform; patient desired telemedicine during pandemic precautions.  MyChart video was not available so telephone was used. The patient has given verbal consent for this type of encounter and has been advised to only accept a meeting of this type in a secure network environment. On date of service, in total, I spent 35 minutes on this encounter.   The attendants for this meeting include Lonie Peak  and Harrie Jeans During the encounter, Lonie Peak was located at [facility] Radiation Oncology Department.  Harrie Jeans was located at home.    __________________________________________   Lonie Peak, MD  This document serves as a record of services personally performed by Lonie Peak, MD. It was created on his behalf by Nikki Dom, a trained medical scribe. The creation of this record is based on the scribe's personal observations and the provider's statements to them. This document has been checked and approved by the attending provider.

## 2020-01-13 NOTE — Progress Notes (Signed)
Oncology Nurse Navigator Documentation  Met with patient during initial telephone consult with Dr. Isidore Moos.  Her son, Keenan Bachelor participated in the phone call as well.  . Further introduced myself as her/their Navigator, explained my role as a member of the Care Team. . Assisted with post-consult appt scheduling. . They verbalized understanding of information provided. . I encouraged them to call with questions/concerns moving forward.  Amy Asa, RN, BSN, OCN Head & Neck Oncology Nurse [place] [facility] [place] [phone]

## 2020-01-13 NOTE — Progress Notes (Signed)
[facility] Clinical Social Work  Initial Assessment   Amy [name] is a 85 y.o. year old female recently diagnosed with head and neck cancer . Clinical Social Work was referred by radiation oncology for assessment of psychosocial needs.   SDOH (Social Determinants of Health) assessments performed: No   Distress Screen completed: No    Family/Social Information:  . Housing Arrangement: patient lives alone . Family members/support persons in your life? Family and Church . Transportation concerns: no  . Employment: Retired. Income source: Product manager . Financial concerns: No o Type of concern: None . Food access concerns: no . Religious or spiritual practice: yes . Medication Concerns: no  .   Coping/ Adjustment to diagnosis: . Patient understands treatment plan and what happens next? yes . Concerns about diagnosis and/or treatment: Pain or discomfort during procedures . Patient reported stressors: Adjusting to my illness . Hopes and priorities: to complete treatment and maintain independence  . Patient enjoys time with family/ friends . Current coping skills/ strengths: Capable of independent living, Religious Affiliation and Supportive family/friends    SUMMARY: Patient stated she was doing well, and has a good understanding of the next steps in her treatment plan.  Patient lives alone and drives herself to appointments.  Patients daughter and son in-law live in [place], [place] and are her primary support.  Patient also identified her faith and church family as supportive.  Patient was appreciative of CSW contact and did not identify any needs at this time.    Clinical Social Work Clinical Goal(s):  Marland Kitchen Patient will contact CSW with questions or concerns  Interventions: . Discussed common feeling and emotions when being diagnosed with cancer, and the importance of support during treatment . Informed patient of the support team roles and support services at [facility] . Provided  CSW contact information and encouraged patient to call with any questions or concerns .   Amy [name], MSW, LCSW, OSW-C Clinical Social Worker [facility] [phone]        Estill Batten , LCSW

## 2020-01-14 ENCOUNTER — Telehealth: Payer: Self-pay | Admitting: *Deleted

## 2020-01-14 ENCOUNTER — Encounter: Payer: Self-pay | Admitting: Radiation Oncology

## 2020-01-14 NOTE — Telephone Encounter (Signed)
Received call from patient. She states  She is not feeling well-cough, sore throat, fatigue. She went to her PCP's office and was tested for covid. She will not have the answer until late Thursday or Friday. In view of that, Her appt for tomorrow will be re-scheduled once she has her results back. Ms. Rachel voiced understanding. Francis Dowse will call this office when she gets her results back.  Dr. Leonides Schanz made aware.

## 2020-01-15 ENCOUNTER — Inpatient Hospital Stay: Payer: Medicare Other

## 2020-01-15 ENCOUNTER — Inpatient Hospital Stay: Payer: Medicare Other | Admitting: Hematology and Oncology

## 2020-01-19 ENCOUNTER — Telehealth: Payer: Self-pay | Admitting: *Deleted

## 2020-01-19 NOTE — Telephone Encounter (Signed)
Received call from patient stating that she does not have Covid. She states she is feeling better. She is asking to have her appt with Dr. Lorenso Courier scheduled now that she is feeling better. Advised that I would send a message to scheduling to have her set up to see see Dr. Lorenso Courier with labs and flush at his next available. She is aware that she has radiation starting soon and is aware of those appts.  No other questions or concerns

## 2020-01-20 ENCOUNTER — Telehealth: Payer: Self-pay

## 2020-01-20 ENCOUNTER — Telehealth: Payer: Self-pay | Admitting: Hematology and Oncology

## 2020-01-20 NOTE — Telephone Encounter (Signed)
Nutrition  Patient was a no show for nutrition appointment today at 1:45pm.  Will send message to scheduling to offer another appointment. Provider was notified as well.  Yashas Camilli B. Zenia Resides, [place], [place] Registered Dietitian [phone] (mobile)

## 2020-01-20 NOTE — Telephone Encounter (Signed)
Scheduled appt per 1/10 sch msg - pt is aware of appt date and time   

## 2020-01-23 ENCOUNTER — Ambulatory Visit: Admission: RE | Admit: 2020-01-23 | Payer: Medicare Other | Source: Ambulatory Visit | Admitting: Radiation Oncology

## 2020-01-27 ENCOUNTER — Other Ambulatory Visit: Payer: Self-pay

## 2020-01-27 ENCOUNTER — Ambulatory Visit
Admission: RE | Admit: 2020-01-27 | Discharge: 2020-01-27 | Disposition: A | Discharge status: Home / Self Care | Payer: Medicare Other | Source: Ambulatory Visit | Attending: Radiation Oncology | Admitting: Radiation Oncology

## 2020-01-27 DIAGNOSIS — Z510 Encounter for antineoplastic radiation therapy: Secondary | ICD-10-CM | POA: Insufficient documentation

## 2020-01-27 DIAGNOSIS — C8331 Diffuse large B-cell lymphoma, lymph nodes of head, face, and neck: Secondary | ICD-10-CM | POA: Diagnosis present

## 2020-01-28 DIAGNOSIS — Z510 Encounter for antineoplastic radiation therapy: Secondary | ICD-10-CM | POA: Diagnosis not present

## 2020-01-29 ENCOUNTER — Ambulatory Visit: Payer: Medicare Other

## 2020-01-30 ENCOUNTER — Ambulatory Visit: Payer: Medicare Other

## 2020-01-30 ENCOUNTER — Telehealth: Payer: Self-pay | Admitting: *Deleted

## 2020-01-30 NOTE — Telephone Encounter (Signed)
ceived call from patient seeking to clarify her upcoming appts. Reviewed her schedule with her and pt able to repeat back and voiced understanding of her appts.

## 2020-02-01 ENCOUNTER — Other Ambulatory Visit: Payer: Self-pay | Admitting: Hematology and Oncology

## 2020-02-01 DIAGNOSIS — C8331 Diffuse large B-cell lymphoma, lymph nodes of head, face, and neck: Secondary | ICD-10-CM

## 2020-02-02 ENCOUNTER — Encounter: Payer: Self-pay | Admitting: Hematology and Oncology

## 2020-02-02 ENCOUNTER — Inpatient Hospital Stay: Payer: Medicare Other

## 2020-02-02 ENCOUNTER — Other Ambulatory Visit: Payer: Self-pay

## 2020-02-02 ENCOUNTER — Ambulatory Visit: Payer: Medicare Other

## 2020-02-02 ENCOUNTER — Inpatient Hospital Stay: Payer: Medicare Other | Attending: Hematology and Oncology | Admitting: Hematology and Oncology

## 2020-02-02 VITALS — BP 142/65 | HR 78 | Temp 97.1°F | Resp 18 | Ht 65.0 in | Wt 132.6 lb

## 2020-02-02 DIAGNOSIS — J449 Chronic obstructive pulmonary disease, unspecified: Secondary | ICD-10-CM | POA: Insufficient documentation

## 2020-02-02 DIAGNOSIS — C8331 Diffuse large B-cell lymphoma, lymph nodes of head, face, and neck: Secondary | ICD-10-CM | POA: Diagnosis present

## 2020-02-02 DIAGNOSIS — Z95828 Presence of other vascular implants and grafts: Secondary | ICD-10-CM | POA: Diagnosis not present

## 2020-02-02 DIAGNOSIS — Z90710 Acquired absence of both cervix and uterus: Secondary | ICD-10-CM | POA: Insufficient documentation

## 2020-02-02 DIAGNOSIS — F419 Anxiety disorder, unspecified: Secondary | ICD-10-CM | POA: Diagnosis not present

## 2020-02-02 DIAGNOSIS — Z7952 Long term (current) use of systemic steroids: Secondary | ICD-10-CM | POA: Diagnosis not present

## 2020-02-02 DIAGNOSIS — Z7982 Long term (current) use of aspirin: Secondary | ICD-10-CM | POA: Insufficient documentation

## 2020-02-02 DIAGNOSIS — Z833 Family history of diabetes mellitus: Secondary | ICD-10-CM | POA: Diagnosis not present

## 2020-02-02 DIAGNOSIS — Z8261 Family history of arthritis: Secondary | ICD-10-CM | POA: Diagnosis not present

## 2020-02-02 DIAGNOSIS — I4891 Unspecified atrial fibrillation: Secondary | ICD-10-CM | POA: Diagnosis not present

## 2020-02-02 DIAGNOSIS — Z7951 Long term (current) use of inhaled steroids: Secondary | ICD-10-CM | POA: Insufficient documentation

## 2020-02-02 DIAGNOSIS — E039 Hypothyroidism, unspecified: Secondary | ICD-10-CM | POA: Diagnosis not present

## 2020-02-02 DIAGNOSIS — Z8249 Family history of ischemic heart disease and other diseases of the circulatory system: Secondary | ICD-10-CM | POA: Diagnosis not present

## 2020-02-02 DIAGNOSIS — K219 Gastro-esophageal reflux disease without esophagitis: Secondary | ICD-10-CM | POA: Diagnosis not present

## 2020-02-02 DIAGNOSIS — E785 Hyperlipidemia, unspecified: Secondary | ICD-10-CM | POA: Insufficient documentation

## 2020-02-02 DIAGNOSIS — Z79899 Other long term (current) drug therapy: Secondary | ICD-10-CM | POA: Diagnosis not present

## 2020-02-02 DIAGNOSIS — Z87891 Personal history of nicotine dependence: Secondary | ICD-10-CM | POA: Insufficient documentation

## 2020-02-02 LAB — CBC WITH DIFFERENTIAL (CANCER CENTER ONLY)
Abs Immature Granulocytes: 0.01 10*3/uL (ref 0.00–0.07)
Basophils Absolute: 0.1 10*3/uL (ref 0.0–0.1)
Basophils Relative: 1 %
Eosinophils Absolute: 0.2 10*3/uL (ref 0.0–0.5)
Eosinophils Relative: 3 %
HCT: 33.9 % — ABNORMAL LOW (ref 36.0–46.0)
Hemoglobin: 11 g/dL — ABNORMAL LOW (ref 12.0–15.0)
Immature Granulocytes: 0 %
Lymphocytes Relative: 60 %
Lymphs Abs: 4.5 10*3/uL — ABNORMAL HIGH (ref 0.7–4.0)
MCH: 32.6 pg (ref 26.0–34.0)
MCHC: 32.4 g/dL (ref 30.0–36.0)
MCV: 100.6 fL — ABNORMAL HIGH (ref 80.0–100.0)
Monocytes Absolute: 0.8 10*3/uL (ref 0.1–1.0)
Monocytes Relative: 10 %
Neutro Abs: 1.9 10*3/uL (ref 1.7–7.7)
Neutrophils Relative %: 26 %
Platelet Count: 264 10*3/uL (ref 150–400)
RBC: 3.37 MIL/uL — ABNORMAL LOW (ref 3.87–5.11)
RDW: 15.5 % (ref 11.5–15.5)
WBC Count: 7.5 10*3/uL (ref 4.0–10.5)
nRBC: 0 % (ref 0.0–0.2)

## 2020-02-02 LAB — CMP (CANCER CENTER ONLY)
ALT: 25 U/L (ref 0–44)
AST: 29 U/L (ref 15–41)
Albumin: 3.6 g/dL (ref 3.5–5.0)
Alkaline Phosphatase: 144 U/L — ABNORMAL HIGH (ref 38–126)
Anion gap: 7 (ref 5–15)
BUN: 9 mg/dL (ref 8–23)
CO2: 28 mmol/L (ref 22–32)
Calcium: 9.2 mg/dL (ref 8.9–10.3)
Chloride: 104 mmol/L (ref 98–111)
Creatinine: 0.72 mg/dL (ref 0.44–1.00)
GFR, Estimated: >60 mL/min (ref >60)
Glucose, Bld: 101 mg/dL — ABNORMAL HIGH (ref 70–99)
Potassium: 4 mmol/L (ref 3.5–5.1)
Sodium: 139 mmol/L (ref 135–145)
Total Bilirubin: 0.5 mg/dL (ref 0.3–1.2)
Total Protein: 6.9 g/dL (ref 6.5–8.1)

## 2020-02-02 LAB — LACTATE DEHYDROGENASE: LDH: 163 U/L (ref 98–192)

## 2020-02-02 MED ORDER — SODIUM CHLORIDE 0.9% FLUSH
10.0000 mL | Freq: Once | INTRAVENOUS | Status: AC
  Administered 2020-02-02: 10 mL
  Filled 2020-02-02: qty 10

## 2020-02-02 MED ORDER — HEPARIN SOD (PORK) LOCK FLUSH 100 UNIT/ML IV SOLN
500.0000 [IU] | Freq: Once | INTRAVENOUS | Status: AC
  Administered 2020-02-02: 500 [IU]
  Filled 2020-02-02: qty 5

## 2020-02-02 NOTE — Progress Notes (Signed)
[place] [phone]   [phone]  PROGRESS NOTE  Patient Care Team: Amie Critchley as PCP - General (Family Medicine)  Hematological/Oncological History # Diffuse Large B Cell Lymphoma, Stage II 1) 09/26/2019: CT scan of neck performed due to persistent tonsillar lesion after outpatient antibiotic course. CT scan showed a heterogeneous right tonsillar mass up to 3.2 cm and a small but suspicious right level 2A lymph node  2) 09/30/2019: patient underwent biopsy with ENT which revealed a DLBCL with FISH for MYC, BCL2 and BCL6 pending 3) 10/08/2019: establish care with Dr. Lorenso Courier  4) 10/21/2019: PET scan showed hypermetabolic right tonsillar mass and adjacent right level 2 adenopathy, consistent with the given history of lymphoma. 5) 11/13/2019: Cycle 1 Day 1 of R-miniCHOP 6) 12/05/2019: Cycle 2 Day 1 of R-miniCHOP 7) 12/25/2019: Cycle 3 Day 1 of R-miniCHOP 8) 02/04/2020-02/26/2020: anticipated radiation therapy.   Interval History:  Amy [name] 85 y.o. female with medical history significant for Stage II DLBCL of the tonsil who presents for a follow up visit. The patient's last visit was on 12/25/2019. In the interim since the last visit she has completed the last of her 3 cycles of R-miniCHOP.  On exam today Mrs. Neiheisel reports tolerated her last cycle of chemotherapy well without any complications.  She has done well in the interim with stable weight and good appetite.  Her energy levels are good and she has not had any recurrence of lumps or bumps in her neck.  She reports she has talked with Dr. Isidore Moos on the phone and is looking forward to starting radiation therapy later this week.  She currently denies any fevers, chills, sweats, nausea, vomiting or diarrhea.  A full 10 point ROS is listed below.  MEDICAL HISTORY:  Past Medical History:  Diagnosis Date  . Anxiety   . Arthritis   . Atrial fibrillation ([place])   . COPD (chronic obstructive  pulmonary disease) ([place])   . Esophageal stricture    s/p repeated dilations, Dr. Watt Climes  . GERD (gastroesophageal reflux disease)   . Hearing loss bilateral   has hearing aids  . Heart murmur   . Hiatal hernia   . History of blood transfusion    many years ago   . Hyperlipidemia   . Hypothyroidism   . Osteoarthritis   . Osteoporosis   . Pulmonary nodules   . Renal lesion    1cm left kidney    SURGICAL HISTORY: Past Surgical History:  Procedure Laterality Date  . ABDOMINAL HYSTERECTOMY    . BREAST ENHANCEMENT SURGERY  2006  . CATARACT EXTRACTION W/ INTRAOCULAR LENS  IMPLANT, BILATERAL  '09  . ESOPHAGEAL DILATION    . IR IMAGING GUIDED PORT INSERTION  11/05/2019  . KNEE ARTHROSCOPY  02/22/2011   Procedure: ARTHROSCOPY KNEE;  Surgeon: Gearlean Alf, MD;  Location: [facility];  Service: Orthopedics;  Laterality: Right;  WITH DEBRIDEMENT   . LAPAROSCOPIC SMALL BOWEL RESECTION N/A 06/02/2016   Procedure: DIAGNOSTIC LAPAROSCOPY SMALL BOWEL RESECTION;  Surgeon: Armandina Gemma, MD;  Location: [facility];  Service: General;  Laterality: N/A;    SOCIAL HISTORY: Social History   Socioeconomic History  . Marital status: Widowed    Spouse name: Not on file  . Number of children: 3  . Years of education: 39  . Highest education level: Not on file  Occupational History  . Not on file  Tobacco Use  . Smoking status: Former Smoker  Packs/day: 1.00    Years: 30.00    Pack years: 30.00    Types: Cigarettes    Quit date: 01/09/1997    Years since quitting: 23.0  . Smokeless tobacco: Never Used  Vaping Use  . Vaping Use: Never used  Substance and Sexual Activity  . Alcohol use: No  . Drug use: No  . Sexual activity: Not Currently  Other Topics Concern  . Not on file  Social History Narrative   Right handed    Live alone   Caffeine use: 1 cup coffee every morning   Social Determinants of Health   Financial Resource Strain: Not on file  Food Insecurity: Not on  file  Transportation Needs: Not on file  Physical Activity: Not on file  Stress: Not on file  Social Connections: Not on file  Intimate Partner Violence: Not on file    FAMILY HISTORY: Family History  Problem Relation Age of Onset  . Heart disease Father   . Rheum arthritis Father   . Heart failure Mother   . Diabetes Brother     ALLERGIES:  is allergic to other and codeine.  MEDICATIONS:  Current Outpatient Medications  Medication Sig Dispense Refill  . allopurinol (ZYLOPRIM) 300 MG tablet Take 1 tablet (300 mg total) by mouth daily. 90 tablet 1  . ALPRAZolam (XANAX) 0.25 MG tablet Take 0.25 mg by mouth 2 (two) times daily.    Marland Kitchen amLODipine (NORVASC) 5 MG tablet Take 5 mg by mouth daily.    Marland Kitchen aspirin EC 81 MG tablet Take 81 mg by mouth at bedtime.    Marland Kitchen atorvastatin (LIPITOR) 20 MG tablet Take 20 mg by mouth at bedtime.  11  . budesonide-formoterol (SYMBICORT) 160-4.5 MCG/ACT inhaler Inhale 2 puffs into the lungs 2 (two) times daily as needed (for shortness of breath).     . Cholecalciferol (VITAMIN D3) 2000 UNITS TABS Take 2,000 Units by mouth at bedtime.     . gabapentin (NEURONTIN) 100 MG capsule TAKE 1 CAP EVERY MORNING, 1 CAP IN THE AFTERNOON, AND 2 CAPS EVERY DAY AT BEDTIME (Patient taking differently: Take 100-200 mg by mouth See admin instructions. Take 100mg  by mouth every morning, take 100mg  by mouth in the afternoon, and take 200mg  by mouth at bedtime) 360 capsule 2  . hydroxypropyl methylcellulose / hypromellose (ISOPTO TEARS / GONIOVISC) 2.5 % ophthalmic solution Place 1-2 drops into both eyes 3 (three) times daily as needed (to soothe dry eyes.).     Marland Kitchen levothyroxine (SYNTHROID) 88 MCG tablet Take 88 mcg by mouth daily.    Marland Kitchen lidocaine-prilocaine (EMLA) cream Apply 1 application topically as needed. 30 g 0  . metoprolol tartrate (LOPRESSOR) 25 MG tablet Take 0.5 tablets (12.5 mg total) by mouth 2 (two) times daily. (Patient taking differently: Take 12.5 mg by mouth daily.)  60 tablet 1  . mirtazapine (REMERON) 15 MG tablet Take 15 mg by mouth at bedtime.     . ondansetron (ZOFRAN ODT) 4 MG disintegrating tablet Take 1 tablet (4 mg total) by mouth every 8 (eight) hours as needed for nausea or vomiting. (Patient not taking: Reported on 01/13/2020) 20 tablet 0  . ondansetron (ZOFRAN) 8 MG tablet Take 1 tablet (8 mg total) by mouth every 8 (eight) hours as needed for nausea or vomiting. (Patient not taking: Reported on 01/13/2020) 30 tablet 0  . pantoprazole (PROTONIX) 40 MG tablet Take 40 mg by mouth 2 (two) times daily.   11  . Potassium 99 MG TABS  Take 99 mg by mouth at bedtime.    . predniSONE (DELTASONE) 20 MG tablet Take 3 tablets (60 mg total) by mouth daily with breakfast. Take 3 tablets daily x 5 days on days 1-5 of chemotherapy. Chemo is every 21 days. Start next cycle on 12/05/19 (Patient not taking: Reported on 01/13/2020) 15 tablet 3  . prochlorperazine (COMPAZINE) 10 MG tablet Take 1 tablet (10 mg total) by mouth every 6 (six) hours as needed for nausea or vomiting. (Patient not taking: Reported on 01/13/2020) 30 tablet 0  . traMADol (ULTRAM) 50 MG tablet Take 50 mg by mouth 2 (two) times daily as needed for moderate pain.     . vitamin B-12 (CYANOCOBALAMIN) 1000 MCG tablet Take 1,000 mcg by mouth daily.     No current facility-administered medications for this visit.    REVIEW OF SYSTEMS:   Constitutional: ( - ) fevers, ( - )  chills , ( - ) night sweats Eyes: ( - ) blurriness of vision, ( - ) double vision, ( - ) watery eyes Ears, nose, mouth, throat, and face: ( - ) mucositis, ( - ) sore throat Respiratory: ( - ) cough, ( - ) dyspnea, ( - ) wheezes Cardiovascular: ( - ) palpitation, ( - ) chest discomfort, ( - ) lower extremity swelling Gastrointestinal:  ( - ) nausea, ( - ) heartburn, ( - ) change in bowel habits Skin: ( - ) abnormal skin rashes Lymphatics: ( - ) new lymphadenopathy, ( - ) easy bruising Neurological: ( - ) numbness, ( - ) tingling, ( - )  new weaknesses Behavioral/Psych: ( - ) mood change, ( - ) new changes  All other systems were reviewed with the patient and are negative.  PHYSICAL EXAMINATION: ECOG PERFORMANCE STATUS: 2 - Symptomatic, <50% confined to bed  Vitals:   02/02/20 1024  BP: (!) 142/65  Pulse: 78  Resp: 18  Temp: (!) 97.1 F (36.2 C)  SpO2: 92%   Filed Weights   02/02/20 1024  Weight: 132 lb 9.6 oz (60.1 kg)    GENERAL: well appearing elderly Caucasian female in NAD  SKIN: skin color, texture, turgor are normal, no rashes or significant lesions EYES: conjunctiva are pink and non-injected, sclera clear LUNGS: clear to auscultation and percussion with normal breathing effort HEART: regular rate & rhythm and no murmurs and no lower extremity edema Musculoskeletal: no cyanosis of digits and no clubbing  PSYCH: alert & oriented x 3, fluent speech NEURO: no focal motor/sensory deficits  LABORATORY DATA:  I have reviewed the data as listed CBC Latest Ref Rng & Units 02/02/2020 12/25/2019 12/18/2019  WBC 4.0 - 10.5 K/uL 7.5 7.1 17.7(H)  Hemoglobin 12.0 - 15.0 g/dL 11.0(L) 10.4(L) 10.1(L)  Hematocrit 36.0 - 46.0 % 33.9(L) 32.0(L) 31.3(L)  Platelets 150 - 400 K/uL 264 342 122(L)    CMP Latest Ref Rng & Units 02/02/2020 12/25/2019 12/18/2019  Glucose 70 - 99 mg/dL 101(H) 114(H) 93  BUN 8 - 23 mg/dL 9 8 7(L)  Creatinine 0.44 - 1.00 mg/dL 0.72 0.72 0.72  Sodium 135 - 145 mmol/L 139 140 140  Potassium 3.5 - 5.1 mmol/L 4.0 4.1 4.1  Chloride 98 - 111 mmol/L 104 104 102  CO2 22 - 32 mmol/L 28 30 29   Calcium 8.9 - 10.3 mg/dL 9.2 9.3 8.7(L)  Total Protein 6.5 - 8.1 g/dL 6.9 6.8 6.5  Total Bilirubin 0.3 - 1.2 mg/dL 0.5 0.3 0.3  Alkaline Phos 38 - 126 U/L 144(H) 131(H)  151(H)  AST 15 - 41 U/L 29 25 25   ALT 0 - 44 U/L 25 18 18    RADIOGRAPHIC STUDIES: I have personally reviewed the radiological images as listed and agreed with the findings in the report: hypermetabolic right tonsillar mass.   NM PET Image  Restag (PS) Skull Base To Thigh  Result Date: 01/08/2020 CLINICAL DATA:  Subsequent treatment strategy for large B-cell lymphoma. EXAM: NUCLEAR MEDICINE PET SKULL BASE TO THIGH TECHNIQUE: 6.47 mCi F-18 FDG was injected intravenously. Full-ring PET imaging was performed from the skull base to thigh after the radiotracer. CT data was obtained and used for attenuation correction and anatomic localization. Fasting blood glucose: 104 mg/dl COMPARISON:  PET-CT 10/21/2019 FINDINGS: Mediastinal blood pool activity: SUV max 2.95 Liver activity: SUV max 3.15 NECK: Complete metabolic response involving the neck. The tonsillar mass and neck adenopathy have resolved. No residual hypermetabolism. Incidental CT findings: none CHEST: The small supraclavicular, axillary and mediastinal lymph nodes have resolved. No residual hypermetabolism. Incidental CT findings: none ABDOMEN/PELVIS: No enlarged or hypermetabolic abdominal or pelvic lymph nodes. Small external iliac node showed mild hypermetabolism on the prior study but not on today's study. Small right inguinal hypermetabolic node has also resolved. No hepatic or splenic lesions. Incidental CT findings: none SKELETON: No findings suspicious for osseous lymphoma. Incidental CT findings: none IMPRESSION: Complete metabolic response to therapy. No residual adenopathy or hypermetabolism ([place] 1). Electronically Signed   By: Marijo Sanes M.D.   On: 01/08/2020 11:54    ASSESSMENT & PLAN Amy [name] 85 y.o. female with medical history significant for Stage II DLBCL of the tonsil who presents for a follow up visit.  After review the labs, the records, discussion with the patient the findings most consistent with a stage II diffuse large B-cell lymphoma of the tonsil.  She has involvement of the right tonsil as well as axillary lymph nodes which would constitute stage II disease.  She does have some mild activity in other lymph nodes throughout the body, but nowhere near as  active as the ones within her head neck.  Lymphomas involvement of these lymph nodes is not likely and therefore I would proceed with treatment as if the patient were a stage II.  Previously we discussed the diagnosis of diffuse large B-cell lymphoma and the treatment options moving forward.  The regimen most recommended for this patient would be R mini CHOP with consideration for 3 cycles followed by radiation to the local lymph nodes.  This is consistent with the treatment course to be recommended with R-CHOP chemotherapy for stage II disease.  We are still currently waiting for the results of the double hit panel, however this would not likely change management as the patient would not be able to tolerate full strength chemotherapy with something like R-EPOCH.  We also discussed the risks and benefits of this R-CHOP chemotherapy including constipation, neurotoxicity, cardiac toxicity, drop in blood counts, fatigue, nausea, vomiting, and alopecia.  The patient voiced her understanding of these complications and wished to proceed forward with treatment.  The regimen of R- miniCHOP consists of rituximab 325 mg per metered squared, cyclophosphamide 400 mg per metered squared IV, doxorubicin 25 mg per metered squared IV, vincristine 1 mg IV, and prednisone 40 mg per metered squared p.o. on days 1 through 5.  This will be pursued for 3 cycles of chemotherapy with the addition of radiation therapy.  Based on NCCN recommendations, member institutes do practice this regimen (short course chemo + RT),  though data is not robust.  # Diffuse Large B Cell Lymphoma, Stage II --complete response noted on PET CT scan on 01/08/2020 --completed 3 cycles of R-mini-CHOP  -- TTE shows EF 60-65% with mild diastolic dysfunction --s/p port placement and chemotherapy education -- plan for 3 cycles followed by localized radiation.  --patient has established care with Dr. Isidore Moos and is planning for radiation therapy from  1/26-2/17/2022.  --RTC shortly after completion of radiation on 02/26/2020   #Supportive Care --chemotherapy education completed --zofran 8mg  q8H PRN and compazine 10mg  PO q6H for nausea.  -- d/c allopurinol  -- EMLA cream for port (as need for lab draws) -- no pain medication required at this time.   No orders of the defined types were placed in this encounter.  All questions were answered. The patient knows to call the clinic with any problems, questions or concerns.  A total of more than 30 minutes were spent on this encounter and over half of that time was spent on counseling and coordination of care as outlined above.   Ledell Peoples, MD Department of Hematology/Oncology [place] at [facility] Phone: [phone] Pager: [phone] Email: [email or web address]  02/02/2020 12:53 PM

## 2020-02-03 ENCOUNTER — Inpatient Hospital Stay: Payer: Medicare Other

## 2020-02-03 ENCOUNTER — Ambulatory Visit: Payer: Medicare Other

## 2020-02-03 NOTE — Progress Notes (Signed)
Nutrition  Patient was a no show to nutrition appointment today (2nd no show for nutrition).    Nathin Saran B. Zenia Resides, [place], [place] Registered Dietitian [phone] (mobile)

## 2020-02-04 ENCOUNTER — Other Ambulatory Visit: Payer: Self-pay

## 2020-02-04 ENCOUNTER — Ambulatory Visit
Admission: RE | Admit: 2020-02-04 | Discharge: 2020-02-04 | Disposition: A | Discharge status: Home / Self Care | Payer: Medicare Other | Source: Ambulatory Visit | Attending: Radiation Oncology | Admitting: Radiation Oncology

## 2020-02-04 DIAGNOSIS — Z510 Encounter for antineoplastic radiation therapy: Secondary | ICD-10-CM | POA: Diagnosis not present

## 2020-02-05 ENCOUNTER — Ambulatory Visit
Admission: RE | Admit: 2020-02-05 | Discharge: 2020-02-05 | Disposition: A | Discharge status: Home / Self Care | Payer: Medicare Other | Source: Ambulatory Visit | Attending: Radiation Oncology | Admitting: Radiation Oncology

## 2020-02-05 ENCOUNTER — Other Ambulatory Visit: Payer: Self-pay

## 2020-02-05 DIAGNOSIS — Z510 Encounter for antineoplastic radiation therapy: Secondary | ICD-10-CM | POA: Diagnosis not present

## 2020-02-06 ENCOUNTER — Other Ambulatory Visit: Payer: Self-pay

## 2020-02-06 ENCOUNTER — Ambulatory Visit
Admission: RE | Admit: 2020-02-06 | Discharge: 2020-02-06 | Disposition: A | Discharge status: Home / Self Care | Payer: Medicare Other | Source: Ambulatory Visit | Attending: Radiation Oncology | Admitting: Radiation Oncology

## 2020-02-06 DIAGNOSIS — Z510 Encounter for antineoplastic radiation therapy: Secondary | ICD-10-CM | POA: Diagnosis not present

## 2020-02-09 ENCOUNTER — Other Ambulatory Visit: Payer: Self-pay | Admitting: Radiation Oncology

## 2020-02-09 ENCOUNTER — Ambulatory Visit
Admission: RE | Admit: 2020-02-09 | Discharge: 2020-02-09 | Disposition: A | Discharge status: Home / Self Care | Payer: Medicare Other | Source: Ambulatory Visit | Attending: Radiation Oncology | Admitting: Radiation Oncology

## 2020-02-09 DIAGNOSIS — Z510 Encounter for antineoplastic radiation therapy: Secondary | ICD-10-CM | POA: Diagnosis not present

## 2020-02-09 DIAGNOSIS — C8331 Diffuse large B-cell lymphoma, lymph nodes of head, face, and neck: Secondary | ICD-10-CM

## 2020-02-09 MED ORDER — SONAFINE EX EMUL
1.0000 "application " | Freq: Two times a day (BID) | CUTANEOUS | Status: DC
  Administered 2020-02-09: 1 via TOPICAL

## 2020-02-09 NOTE — Progress Notes (Signed)
Pt here for patient teaching.    Pt given Radiation and You booklet, Managing Acute Radiation Side Effects for Head and Neck Cancer handout, skin care instructions and Sonafine.    Reviewed areas of pertinence such as fatigue, hair loss, skin changes, throat changes, earaches and taste changes .   Pt able to give teach back of to pat skin, use unscented/gentle soap and drink plenty of water,apply Sonafine bid and avoid applying anything to skin within 4 hours of treatment.   Pt demonstrated understanding and verbalizes understanding of information given and will contact nursing with any questions or concerns.    [email or web address]

## 2020-02-09 NOTE — Progress Notes (Signed)
Oncology Nurse Navigator Documentation  I met with Ms. Ng during her weekly PUT visit with Dr. Isidore Moos. She reports doing well with treatment and receiving education today from [employer]. She knows to contact me if she has any questions or concerns.   Harlow Asa RN, BSN, OCN Head & Neck Oncology Nurse [place] at [facility] Phone # [phone]  Fax # [phone]

## 2020-02-10 ENCOUNTER — Other Ambulatory Visit: Payer: Self-pay

## 2020-02-10 ENCOUNTER — Ambulatory Visit
Admission: RE | Admit: 2020-02-10 | Discharge: 2020-02-10 | Disposition: A | Discharge status: Home / Self Care | Payer: Medicare Other | Source: Ambulatory Visit | Attending: Radiation Oncology | Admitting: Radiation Oncology

## 2020-02-10 DIAGNOSIS — Z510 Encounter for antineoplastic radiation therapy: Secondary | ICD-10-CM | POA: Diagnosis present

## 2020-02-10 DIAGNOSIS — C8331 Diffuse large B-cell lymphoma, lymph nodes of head, face, and neck: Secondary | ICD-10-CM | POA: Insufficient documentation

## 2020-02-11 ENCOUNTER — Ambulatory Visit
Admission: RE | Admit: 2020-02-11 | Discharge: 2020-02-11 | Disposition: A | Discharge status: Home / Self Care | Payer: Medicare Other | Source: Ambulatory Visit | Attending: Radiation Oncology | Admitting: Radiation Oncology

## 2020-02-11 ENCOUNTER — Other Ambulatory Visit: Payer: Self-pay

## 2020-02-11 DIAGNOSIS — Z510 Encounter for antineoplastic radiation therapy: Secondary | ICD-10-CM | POA: Diagnosis not present

## 2020-02-12 ENCOUNTER — Ambulatory Visit
Admission: RE | Admit: 2020-02-12 | Discharge: 2020-02-12 | Disposition: A | Discharge status: Home / Self Care | Payer: Medicare Other | Source: Ambulatory Visit | Attending: Radiation Oncology | Admitting: Radiation Oncology

## 2020-02-12 DIAGNOSIS — Z510 Encounter for antineoplastic radiation therapy: Secondary | ICD-10-CM | POA: Diagnosis not present

## 2020-02-13 ENCOUNTER — Ambulatory Visit
Admission: RE | Admit: 2020-02-13 | Discharge: 2020-02-13 | Disposition: A | Discharge status: Home / Self Care | Payer: Medicare Other | Source: Ambulatory Visit | Attending: Radiation Oncology | Admitting: Radiation Oncology

## 2020-02-13 DIAGNOSIS — Z510 Encounter for antineoplastic radiation therapy: Secondary | ICD-10-CM | POA: Diagnosis not present

## 2020-02-16 ENCOUNTER — Ambulatory Visit: Payer: Medicare Other

## 2020-02-17 ENCOUNTER — Other Ambulatory Visit: Payer: Self-pay | Admitting: Radiation Oncology

## 2020-02-17 ENCOUNTER — Other Ambulatory Visit: Payer: Self-pay

## 2020-02-17 ENCOUNTER — Ambulatory Visit
Admission: RE | Admit: 2020-02-17 | Discharge: 2020-02-17 | Disposition: A | Discharge status: Home / Self Care | Payer: Medicare Other | Source: Ambulatory Visit | Attending: Radiation Oncology | Admitting: Radiation Oncology

## 2020-02-17 DIAGNOSIS — C8331 Diffuse large B-cell lymphoma, lymph nodes of head, face, and neck: Secondary | ICD-10-CM

## 2020-02-17 DIAGNOSIS — Z510 Encounter for antineoplastic radiation therapy: Secondary | ICD-10-CM | POA: Diagnosis not present

## 2020-02-17 MED ORDER — LIDOCAINE VISCOUS HCL 2 % MT SOLN: OROMUCOSAL | 3 refills | Status: DC

## 2020-02-18 ENCOUNTER — Ambulatory Visit
Admission: RE | Admit: 2020-02-18 | Discharge: 2020-02-18 | Disposition: A | Discharge status: Home / Self Care | Payer: Medicare Other | Source: Ambulatory Visit | Attending: Radiation Oncology | Admitting: Radiation Oncology

## 2020-02-18 ENCOUNTER — Other Ambulatory Visit: Payer: Self-pay

## 2020-02-18 DIAGNOSIS — Z510 Encounter for antineoplastic radiation therapy: Secondary | ICD-10-CM | POA: Diagnosis not present

## 2020-02-19 ENCOUNTER — Ambulatory Visit
Admission: RE | Admit: 2020-02-19 | Discharge: 2020-02-19 | Disposition: A | Discharge status: Home / Self Care | Payer: Medicare Other | Source: Ambulatory Visit | Attending: Radiation Oncology | Admitting: Radiation Oncology

## 2020-02-19 DIAGNOSIS — Z510 Encounter for antineoplastic radiation therapy: Secondary | ICD-10-CM | POA: Diagnosis not present

## 2020-02-20 ENCOUNTER — Ambulatory Visit
Admission: RE | Admit: 2020-02-20 | Discharge: 2020-02-20 | Disposition: A | Discharge status: Home / Self Care | Payer: Medicare Other | Source: Ambulatory Visit | Attending: Radiation Oncology | Admitting: Radiation Oncology

## 2020-02-20 ENCOUNTER — Ambulatory Visit: Payer: Medicare Other

## 2020-02-20 DIAGNOSIS — Z510 Encounter for antineoplastic radiation therapy: Secondary | ICD-10-CM | POA: Diagnosis not present

## 2020-02-23 ENCOUNTER — Ambulatory Visit
Admission: RE | Admit: 2020-02-23 | Discharge: 2020-02-23 | Disposition: A | Discharge status: Home / Self Care | Payer: Medicare Other | Source: Ambulatory Visit | Attending: Radiation Oncology | Admitting: Radiation Oncology

## 2020-02-23 ENCOUNTER — Other Ambulatory Visit: Payer: Self-pay

## 2020-02-23 DIAGNOSIS — Z510 Encounter for antineoplastic radiation therapy: Secondary | ICD-10-CM | POA: Diagnosis not present

## 2020-02-24 ENCOUNTER — Other Ambulatory Visit: Payer: Self-pay

## 2020-02-24 ENCOUNTER — Ambulatory Visit
Admission: RE | Admit: 2020-02-24 | Discharge: 2020-02-24 | Disposition: A | Discharge status: Home / Self Care | Payer: Medicare Other | Source: Ambulatory Visit | Attending: Radiation Oncology | Admitting: Radiation Oncology

## 2020-02-24 DIAGNOSIS — Z510 Encounter for antineoplastic radiation therapy: Secondary | ICD-10-CM | POA: Diagnosis not present

## 2020-02-25 ENCOUNTER — Ambulatory Visit
Admission: RE | Admit: 2020-02-25 | Discharge: 2020-02-25 | Disposition: A | Discharge status: Home / Self Care | Payer: Medicare Other | Source: Ambulatory Visit | Attending: Radiation Oncology | Admitting: Radiation Oncology

## 2020-02-25 ENCOUNTER — Other Ambulatory Visit: Payer: Self-pay | Admitting: Hematology and Oncology

## 2020-02-25 ENCOUNTER — Other Ambulatory Visit: Payer: Self-pay

## 2020-02-25 DIAGNOSIS — Z510 Encounter for antineoplastic radiation therapy: Secondary | ICD-10-CM | POA: Diagnosis not present

## 2020-02-26 ENCOUNTER — Other Ambulatory Visit: Payer: Self-pay

## 2020-02-26 ENCOUNTER — Ambulatory Visit
Admission: RE | Admit: 2020-02-26 | Discharge: 2020-02-26 | Disposition: A | Discharge status: Home / Self Care | Payer: Medicare Other | Source: Ambulatory Visit | Attending: Radiation Oncology | Admitting: Radiation Oncology

## 2020-02-26 DIAGNOSIS — Z510 Encounter for antineoplastic radiation therapy: Secondary | ICD-10-CM | POA: Diagnosis not present

## 2020-02-27 ENCOUNTER — Other Ambulatory Visit: Payer: Self-pay

## 2020-02-27 ENCOUNTER — Encounter: Payer: Self-pay | Admitting: Radiation Oncology

## 2020-02-27 ENCOUNTER — Ambulatory Visit
Admission: RE | Admit: 2020-02-27 | Discharge: 2020-02-27 | Disposition: A | Discharge status: Home / Self Care | Payer: Medicare Other | Source: Ambulatory Visit | Attending: Radiation Oncology | Admitting: Radiation Oncology

## 2020-02-27 DIAGNOSIS — Z510 Encounter for antineoplastic radiation therapy: Secondary | ICD-10-CM | POA: Diagnosis not present

## 2020-03-08 ENCOUNTER — Telehealth: Payer: Self-pay | Admitting: Hematology and Oncology

## 2020-03-08 ENCOUNTER — Other Ambulatory Visit: Payer: Self-pay | Admitting: Hematology and Oncology

## 2020-03-08 NOTE — Telephone Encounter (Signed)
Called patient regarding upcoming 03/03 appointment, patient has been called and notified.

## 2020-03-11 ENCOUNTER — Other Ambulatory Visit: Payer: Self-pay | Admitting: Hematology and Oncology

## 2020-03-11 ENCOUNTER — Inpatient Hospital Stay: Payer: Medicare Other

## 2020-03-11 ENCOUNTER — Other Ambulatory Visit: Payer: Self-pay

## 2020-03-11 ENCOUNTER — Encounter: Payer: Self-pay | Admitting: Hematology and Oncology

## 2020-03-11 ENCOUNTER — Inpatient Hospital Stay: Payer: Medicare Other | Attending: Hematology and Oncology

## 2020-03-11 ENCOUNTER — Inpatient Hospital Stay: Payer: Medicare Other | Admitting: Hematology and Oncology

## 2020-03-11 VITALS — BP 136/54 | HR 81 | Temp 97.6°F | Resp 14 | Ht 65.0 in | Wt 133.4 lb

## 2020-03-11 DIAGNOSIS — Z90710 Acquired absence of both cervix and uterus: Secondary | ICD-10-CM | POA: Insufficient documentation

## 2020-03-11 DIAGNOSIS — Z833 Family history of diabetes mellitus: Secondary | ICD-10-CM | POA: Diagnosis not present

## 2020-03-11 DIAGNOSIS — Z95828 Presence of other vascular implants and grafts: Secondary | ICD-10-CM | POA: Diagnosis not present

## 2020-03-11 DIAGNOSIS — C8331 Diffuse large B-cell lymphoma, lymph nodes of head, face, and neck: Secondary | ICD-10-CM

## 2020-03-11 DIAGNOSIS — Z87891 Personal history of nicotine dependence: Secondary | ICD-10-CM | POA: Diagnosis not present

## 2020-03-11 DIAGNOSIS — Z8261 Family history of arthritis: Secondary | ICD-10-CM | POA: Insufficient documentation

## 2020-03-11 DIAGNOSIS — J449 Chronic obstructive pulmonary disease, unspecified: Secondary | ICD-10-CM | POA: Insufficient documentation

## 2020-03-11 DIAGNOSIS — I4891 Unspecified atrial fibrillation: Secondary | ICD-10-CM | POA: Insufficient documentation

## 2020-03-11 DIAGNOSIS — Z79899 Other long term (current) drug therapy: Secondary | ICD-10-CM | POA: Diagnosis not present

## 2020-03-11 DIAGNOSIS — Z7982 Long term (current) use of aspirin: Secondary | ICD-10-CM | POA: Insufficient documentation

## 2020-03-11 DIAGNOSIS — Z5111 Encounter for antineoplastic chemotherapy: Secondary | ICD-10-CM

## 2020-03-11 DIAGNOSIS — Z7951 Long term (current) use of inhaled steroids: Secondary | ICD-10-CM | POA: Insufficient documentation

## 2020-03-11 DIAGNOSIS — Z923 Personal history of irradiation: Secondary | ICD-10-CM | POA: Diagnosis not present

## 2020-03-11 DIAGNOSIS — Z8249 Family history of ischemic heart disease and other diseases of the circulatory system: Secondary | ICD-10-CM | POA: Diagnosis not present

## 2020-03-11 DIAGNOSIS — E785 Hyperlipidemia, unspecified: Secondary | ICD-10-CM | POA: Insufficient documentation

## 2020-03-11 DIAGNOSIS — E039 Hypothyroidism, unspecified: Secondary | ICD-10-CM | POA: Diagnosis not present

## 2020-03-11 LAB — CMP (CANCER CENTER ONLY)
ALT: 16 U/L (ref 0–44)
AST: 26 U/L (ref 15–41)
Albumin: 3.7 g/dL (ref 3.5–5.0)
Alkaline Phosphatase: 133 U/L — ABNORMAL HIGH (ref 38–126)
Anion gap: 7 (ref 5–15)
BUN: 8 mg/dL (ref 8–23)
CO2: 29 mmol/L (ref 22–32)
Calcium: 8.9 mg/dL (ref 8.9–10.3)
Chloride: 103 mmol/L (ref 98–111)
Creatinine: 0.69 mg/dL (ref 0.44–1.00)
GFR, Estimated: >60 mL/min (ref >60)
Glucose, Bld: 106 mg/dL — ABNORMAL HIGH (ref 70–99)
Potassium: 3.9 mmol/L (ref 3.5–5.1)
Sodium: 139 mmol/L (ref 135–145)
Total Bilirubin: 0.6 mg/dL (ref 0.3–1.2)
Total Protein: 6.8 g/dL (ref 6.5–8.1)

## 2020-03-11 LAB — CBC WITH DIFFERENTIAL (CANCER CENTER ONLY)
Abs Immature Granulocytes: 0.01 10*3/uL (ref 0.00–0.07)
Basophils Absolute: 0 10*3/uL (ref 0.0–0.1)
Basophils Relative: 1 %
Eosinophils Absolute: 0.2 10*3/uL (ref 0.0–0.5)
Eosinophils Relative: 3 %
HCT: 35.6 % — ABNORMAL LOW (ref 36.0–46.0)
Hemoglobin: 11.6 g/dL — ABNORMAL LOW (ref 12.0–15.0)
Immature Granulocytes: 0 %
Lymphocytes Relative: 59 %
Lymphs Abs: 3.7 10*3/uL (ref 0.7–4.0)
MCH: 32.8 pg (ref 26.0–34.0)
MCHC: 32.6 g/dL (ref 30.0–36.0)
MCV: 100.6 fL — ABNORMAL HIGH (ref 80.0–100.0)
Monocytes Absolute: 0.7 10*3/uL (ref 0.1–1.0)
Monocytes Relative: 11 %
Neutro Abs: 1.6 10*3/uL — ABNORMAL LOW (ref 1.7–7.7)
Neutrophils Relative %: 26 %
Platelet Count: 246 10*3/uL (ref 150–400)
RBC: 3.54 MIL/uL — ABNORMAL LOW (ref 3.87–5.11)
RDW: 13.2 % (ref 11.5–15.5)
WBC Count: 6.2 10*3/uL (ref 4.0–10.5)
nRBC: 0 % (ref 0.0–0.2)

## 2020-03-11 LAB — LACTATE DEHYDROGENASE: LDH: 232 U/L — ABNORMAL HIGH (ref 98–192)

## 2020-03-11 MED ORDER — SODIUM CHLORIDE 0.9% FLUSH
10.0000 mL | Freq: Once | INTRAVENOUS | Status: AC
  Administered 2020-03-11: 10 mL
  Filled 2020-03-11: qty 10

## 2020-03-11 MED ORDER — HEPARIN SOD (PORK) LOCK FLUSH 100 UNIT/ML IV SOLN
500.0000 [IU] | Freq: Once | INTRAVENOUS | Status: AC
Start: 2020-03-11 — End: 2020-03-11
  Administered 2020-03-11: 500 [IU]
  Filled 2020-03-11: qty 5

## 2020-03-11 NOTE — Progress Notes (Signed)
[place] [phone]   [phone]  PROGRESS NOTE  Patient Care Team: Amie Critchley as PCP - General (Family Medicine)  Hematological/Oncological History # Diffuse Large B Cell Lymphoma, Stage II 1) 09/26/2019: CT scan of neck performed due to persistent tonsillar lesion after outpatient antibiotic course. CT scan showed a heterogeneous right tonsillar mass up to 3.2 cm and a small but suspicious right level 2A lymph node  2) 09/30/2019: patient underwent biopsy with ENT which revealed a DLBCL with FISH for MYC, BCL2 and BCL6 pending 3) 10/08/2019: establish care with Dr. Lorenso Courier  4) 10/21/2019: PET scan showed hypermetabolic right tonsillar mass and adjacent right level 2 adenopathy, consistent with the given history of lymphoma. 5) 11/13/2019: Cycle 1 Day 1 of R-miniCHOP 6) 12/05/2019: Cycle 2 Day 1 of R-miniCHOP 7) 12/25/2019: Cycle 3 Day 1 of R-miniCHOP 8) 02/04/2020-02/27/2020: radiation therapy.   Interval History:  Amy [name] 85 y.o. female with medical history significant for Stage II DLBCL of the tonsil who presents for a follow up visit. The patient's last visit was on 02/02/2020. In the interim since the last visit she has completed radiation treatment.   On exam today Amy [name] reports tolerated radiation therapy well without any complications.  She has done well in the interim with stable weight and good appetite.  Her energy levels are good and she has not had any recurrence of lumps or bumps in her neck.   She currently denies any fevers, chills, sweats, nausea, vomiting or diarrhea.  A full 10 point ROS is listed below.  MEDICAL HISTORY:  Past Medical History:  Diagnosis Date  . Anxiety   . Arthritis   . Atrial fibrillation ([place])   . COPD (chronic obstructive pulmonary disease) ([place])   . Esophageal stricture    s/p repeated dilations, Dr. Watt Climes  . GERD (gastroesophageal reflux disease)   . Hearing loss bilateral   has  hearing aids  . Heart murmur   . Hiatal hernia   . History of blood transfusion    many years ago   . Hyperlipidemia   . Hypothyroidism   . Osteoarthritis   . Osteoporosis   . Pulmonary nodules   . Renal lesion    1cm left kidney    SURGICAL HISTORY: Past Surgical History:  Procedure Laterality Date  . ABDOMINAL HYSTERECTOMY    . BREAST ENHANCEMENT SURGERY  2006  . CATARACT EXTRACTION W/ INTRAOCULAR LENS  IMPLANT, BILATERAL  '09  . ESOPHAGEAL DILATION    . IR IMAGING GUIDED PORT INSERTION  11/05/2019  . KNEE ARTHROSCOPY  02/22/2011   Procedure: ARTHROSCOPY KNEE;  Surgeon: Gearlean Alf, MD;  Location[facility];  Service: Orthopedics;  Laterality: Right;  WITH DEBRIDEMENT   . LAPAROSCOPIC SMALL BOWEL RESECTION N/A 06/02/2016   Procedure: DIAGNOSTIC LAPAROSCOPY SMALL BOWEL RESECTION;  Surgeon: Armandina Gemma, MD;  Location: [facility];  Service: General;  Laterality: N/A;    SOCIAL HISTORY: Social History   Socioeconomic History  . Marital status: Widowed    Spouse name: Not on file  . Number of children: 3  . Years of education: 63  . Highest education level: Not on file  Occupational History  . Not on file  Tobacco Use  . Smoking status: Former Smoker    Packs/day: 1.00    Years: 30.00    Pack years: 30.00    Types: Cigarettes    Quit date: 01/09/1997    Years since quitting:  23.1  . Smokeless tobacco: Never Used  Vaping Use  . Vaping Use: Never used  Substance and Sexual Activity  . Alcohol use: No  . Drug use: No  . Sexual activity: Not Currently  Other Topics Concern  . Not on file  Social History Narrative   Right handed    Live alone   Caffeine use: 1 cup coffee every morning   Social Determinants of Health   Financial Resource Strain: Not on file  Food Insecurity: Not on file  Transportation Needs: Not on file  Physical Activity: Not on file  Stress: Not on file  Social Connections: Not on file  Intimate Partner Violence: Not on  file    FAMILY HISTORY: Family History  Problem Relation Age of Onset  . Heart disease Father   . Rheum arthritis Father   . Heart failure Mother   . Diabetes Brother     ALLERGIES:  is allergic to other and codeine.  MEDICATIONS:  Current Outpatient Medications  Medication Sig Dispense Refill  . ALPRAZolam (XANAX) 0.25 MG tablet Take 0.25 mg by mouth 2 (two) times daily.    Marland Kitchen amLODipine (NORVASC) 5 MG tablet Take 5 mg by mouth daily.    Marland Kitchen aspirin EC 81 MG tablet Take 81 mg by mouth at bedtime.    Marland Kitchen atorvastatin (LIPITOR) 20 MG tablet Take 20 mg by mouth at bedtime.  11  . budesonide-formoterol (SYMBICORT) 160-4.5 MCG/ACT inhaler Inhale 2 puffs into the lungs 2 (two) times daily as needed (for shortness of breath).     . Cholecalciferol (VITAMIN D3) 2000 UNITS TABS Take 2,000 Units by mouth at bedtime.     . gabapentin (NEURONTIN) 100 MG capsule TAKE 1 CAP EVERY MORNING, 1 CAP IN THE AFTERNOON, AND 2 CAPS EVERY DAY AT BEDTIME (Patient taking differently: Take 100-200 mg by mouth See admin instructions. Take 100mg  by mouth every morning, take 100mg  by mouth in the afternoon, and take 200mg  by mouth at bedtime) 360 capsule 2  . hydroxypropyl methylcellulose / hypromellose (ISOPTO TEARS / GONIOVISC) 2.5 % ophthalmic solution Place 1-2 drops into both eyes 3 (three) times daily as needed (to soothe dry eyes.).     Marland Kitchen levothyroxine (SYNTHROID) 88 MCG tablet Take 88 mcg by mouth daily.    Marland Kitchen lidocaine (XYLOCAINE) 2 % solution Patient: Mix 1part 2% viscous lidocaine, 1part H20. Swish & swallow 54mL of diluted mixture, 45min before meals and at bedtime, up to QID 200 mL 3  . lidocaine-prilocaine (EMLA) cream Apply 1 application topically as needed. 30 g 0  . metoprolol tartrate (LOPRESSOR) 25 MG tablet Take 0.5 tablets (12.5 mg total) by mouth 2 (two) times daily. (Patient taking differently: Take 12.5 mg by mouth daily.) 60 tablet 1  . mirtazapine (REMERON) 15 MG tablet Take 15 mg by mouth at  bedtime.     . ondansetron (ZOFRAN ODT) 4 MG disintegrating tablet Take 1 tablet (4 mg total) by mouth every 8 (eight) hours as needed for nausea or vomiting. (Patient not taking: Reported on 01/13/2020) 20 tablet 0  . ondansetron (ZOFRAN) 8 MG tablet Take 1 tablet (8 mg total) by mouth every 8 (eight) hours as needed for nausea or vomiting. (Patient not taking: Reported on 01/13/2020) 30 tablet 0  . pantoprazole (PROTONIX) 40 MG tablet Take 40 mg by mouth 2 (two) times daily.   11  . Potassium 99 MG TABS Take 99 mg by mouth at bedtime.    . prochlorperazine (COMPAZINE) 10 MG  tablet Take 1 tablet (10 mg total) by mouth every 6 (six) hours as needed for nausea or vomiting. (Patient not taking: Reported on 01/13/2020) 30 tablet 0  . traMADol (ULTRAM) 50 MG tablet Take 50 mg by mouth 2 (two) times daily as needed for moderate pain.     . vitamin B-12 (CYANOCOBALAMIN) 1000 MCG tablet Take 1,000 mcg by mouth daily.     No current facility-administered medications for this visit.    REVIEW OF SYSTEMS:   Constitutional: ( - ) fevers, ( - )  chills , ( - ) night sweats Eyes: ( - ) blurriness of vision, ( - ) double vision, ( - ) watery eyes Ears, nose, mouth, throat, and face: ( - ) mucositis, ( - ) sore throat Respiratory: ( - ) cough, ( - ) dyspnea, ( - ) wheezes Cardiovascular: ( - ) palpitation, ( - ) chest discomfort, ( - ) lower extremity swelling Gastrointestinal:  ( - ) nausea, ( - ) heartburn, ( - ) change in bowel habits Skin: ( - ) abnormal skin rashes Lymphatics: ( - ) new lymphadenopathy, ( - ) easy bruising Neurological: ( - ) numbness, ( - ) tingling, ( - ) new weaknesses Behavioral/Psych: ( - ) mood change, ( - ) new changes  All other systems were reviewed with the patient and are negative.  PHYSICAL EXAMINATION: ECOG PERFORMANCE STATUS: 2 - Symptomatic, <50% confined to bed  Vitals:   03/11/20 1138  BP: (!) 136/54  Pulse: 81  Resp: 14  Temp: 97.6 F (36.4 C)  SpO2: 93%    Filed Weights   03/11/20 1138  Weight: 133 lb 6.4 oz (60.5 kg)    GENERAL: well appearing elderly Caucasian female in NAD  SKIN: skin color, texture, turgor are normal, no rashes or significant lesions EYES: conjunctiva are pink and non-injected, sclera clear LUNGS: clear to auscultation and percussion with normal breathing effort HEART: regular rate & rhythm and no murmurs and no lower extremity edema Musculoskeletal: no cyanosis of digits and no clubbing  PSYCH: alert & oriented x 3, fluent speech NEURO: no focal motor/sensory deficits  LABORATORY DATA:  I have reviewed the data as listed CBC Latest Ref Rng & Units 03/11/2020 02/02/2020 12/25/2019  WBC 4.0 - 10.5 K/uL 6.2 7.5 7.1  Hemoglobin 12.0 - 15.0 g/dL 11.6(L) 11.0(L) 10.4(L)  Hematocrit 36.0 - 46.0 % 35.6(L) 33.9(L) 32.0(L)  Platelets 150 - 400 K/uL 246 264 342    CMP Latest Ref Rng & Units 03/11/2020 02/02/2020 12/25/2019  Glucose 70 - 99 mg/dL 106(H) 101(H) 114(H)  BUN 8 - 23 mg/dL 8 9 8   Creatinine 0.44 - 1.00 mg/dL 0.69 0.72 0.72  Sodium 135 - 145 mmol/L 139 139 140  Potassium 3.5 - 5.1 mmol/L 3.9 4.0 4.1  Chloride 98 - 111 mmol/L 103 104 104  CO2 22 - 32 mmol/L 29 28 30   Calcium 8.9 - 10.3 mg/dL 8.9 9.2 9.3  Total Protein 6.5 - 8.1 g/dL 6.8 6.9 6.8  Total Bilirubin 0.3 - 1.2 mg/dL 0.6 0.5 0.3  Alkaline Phos 38 - 126 U/L 133(H) 144(H) 131(H)  AST 15 - 41 U/L 26 29 25   ALT 0 - 44 U/L 16 25 18    RADIOGRAPHIC STUDIES: No results found.  ASSESSMENT & PLAN Amy [name] 85 y.o. female with medical history significant for Stage II DLBCL of the tonsil who presents for a follow up visit.  After review the labs, the records, discussion with the patient  the findings most consistent with a stage II diffuse large B-cell lymphoma of the tonsil.  She has involvement of the right tonsil as well as axillary lymph nodes which would constitute stage II disease.  She does have some mild activity in other lymph nodes throughout the  body, but nowhere near as active as the ones within her head neck.  Lymphomas involvement of these lymph nodes is not likely and therefore I would proceed with treatment as if the patient were a stage II.  Previously we discussed the diagnosis of diffuse large B-cell lymphoma and the treatment options moving forward.  The regimen most recommended for this patient would be R mini CHOP with consideration for 3 cycles followed by radiation to the local lymph nodes.  This is consistent with the treatment course to be recommended with R-CHOP chemotherapy for stage II disease.  We are still currently waiting for the results of the double hit panel, however this would not likely change management as the patient would not be able to tolerate full strength chemotherapy with something like R-EPOCH.  We also discussed the risks and benefits of this R-CHOP chemotherapy including constipation, neurotoxicity, cardiac toxicity, drop in blood counts, fatigue, nausea, vomiting, and alopecia.  The patient voiced her understanding of these complications and wished to proceed forward with treatment.  The regimen of R- miniCHOP consists of rituximab 325 mg per metered squared, cyclophosphamide 400 mg per metered squared IV, doxorubicin 25 mg per metered squared IV, vincristine 1 mg IV, and prednisone 40 mg per metered squared p.o. on days 1 through 5.  This will be pursued for 3 cycles of chemotherapy with the addition of radiation therapy.  Based on NCCN recommendations, member institutes do practice this regimen (short course chemo + RT), though data is not robust.  # Diffuse Large B Cell Lymphoma, Stage II --complete response noted on PET CT scan on 01/08/2020 --completed 3 cycles of R-mini-CHOP  followed by localized radiation.  -- TTE shows EF 60-65% with mild diastolic dysfunction --patient has established care with Dr. Isidore Moos and underwent radiation therapy which ended on 02/27/2020.  --repeat CT scan can be performed  every 6 months x 2 years, then only as clinically indicated.  --RTC in 3 months time for continued f/u.   #Supportive Care --chemotherapy education completed --port in place, can be removed at patient discretion.  -- EMLA cream for port (as need for lab draws) -- no pain medication required at this time.   No orders of the defined types were placed in this encounter.  All questions were answered. The patient knows to call the clinic with any problems, questions or concerns.  A total of more than 30 minutes were spent on this encounter and over half of that time was spent on counseling and coordination of care as outlined above.   Ledell Peoples, MD Department of Hematology/Oncology [place] at [facility] Phone: [phone] Pager: [phone] Email: [email or web address]  03/11/2020 12:26 PM

## 2020-03-12 ENCOUNTER — Telehealth: Payer: Self-pay | Admitting: Hematology and Oncology

## 2020-03-12 NOTE — Telephone Encounter (Signed)
Scheduled per los. Called and spoke with patient. Confirmed appt 

## 2020-03-15 ENCOUNTER — Other Ambulatory Visit: Payer: Self-pay | Admitting: Hematology and Oncology

## 2020-03-16 ENCOUNTER — Other Ambulatory Visit: Payer: Self-pay | Admitting: Hematology and Oncology

## 2020-03-19 ENCOUNTER — Ambulatory Visit
Admission: RE | Admit: 2020-03-19 | Discharge: 2020-03-19 | Disposition: A | Discharge status: Home / Self Care | Payer: Medicare Other | Source: Ambulatory Visit | Attending: Radiation Oncology | Admitting: Radiation Oncology

## 2020-03-19 ENCOUNTER — Encounter: Payer: Self-pay | Admitting: Radiation Oncology

## 2020-03-19 ENCOUNTER — Other Ambulatory Visit: Payer: Self-pay

## 2020-03-19 DIAGNOSIS — C8331 Diffuse large B-cell lymphoma, lymph nodes of head, face, and neck: Secondary | ICD-10-CM | POA: Insufficient documentation

## 2020-03-19 DIAGNOSIS — R432 Parageusia: Secondary | ICD-10-CM | POA: Insufficient documentation

## 2020-03-19 DIAGNOSIS — Z7982 Long term (current) use of aspirin: Secondary | ICD-10-CM | POA: Insufficient documentation

## 2020-03-19 DIAGNOSIS — Z923 Personal history of irradiation: Secondary | ICD-10-CM | POA: Insufficient documentation

## 2020-03-19 DIAGNOSIS — Z79899 Other long term (current) drug therapy: Secondary | ICD-10-CM | POA: Diagnosis not present

## 2020-03-19 NOTE — Progress Notes (Signed)
Amy [name] presents today for 2 week follow-up after completing radiation to her right tonsil on 02/27/2020  Pain issues, if any: Patient denies new pain. Patient reports joints are sore. Using a feeding tube?: N/A Weight changes, if any: Last visit patient weighed 133 lb. Current weight 118 lb. Swallowing issues, if any: Reports throat soreness at completion of treatment has resolved. Smoking or chewing tobacco? denies Using fluoride trays daily? N/A Last ENT visit was on: Not since diagnosis Other notable issues, if any: Had F/U with Dr. Narda Rutherford on 03/11/2020  Patient reports she has no taste or smell. She believe this is the contributing factor to why she has lost so much weight. Reports dry mouth. Hoarseness audible. Denies tinnitus.  BP (!) 129/49 (BP Location: Left Arm, Patient Position: Sitting)   Pulse 81   Temp 97.9 F (36.6 C) (Oral)   Resp 18   Ht 5\' 5"  (1.651 m)   Wt 118 lb 6.4 oz (53.7 kg)   SpO2 92%   BMI 19.70 kg/m  Wt Readings from Last 3 Encounters:  03/19/20 118 lb 6.4 oz (53.7 kg)  03/11/20 133 lb 6.4 oz (60.5 kg)  02/02/20 132 lb 9.6 oz (60.1 kg)

## 2020-03-22 ENCOUNTER — Telehealth: Payer: Self-pay | Admitting: *Deleted

## 2020-03-22 NOTE — Progress Notes (Signed)
Oncology Nurse Navigator Documentation  I met with Ms. Gillson during her post radiation treatment follow up with Dr. Isidore Moos today. She reports having difficulty eating due to taste changes. I suggested she supplement with ensure or boost and add ice cream or whole milk to increase her calorie intake and Dr. Isidore Moos did the same during the consult. I have contacted nutritional services to reach out to Ms. Hoheisel again due to several missed appointments during treatment. Ms. Paradiso knows to call me with any further questions or concerns.   Harlow Asa RN, BSN, OCN Head & Neck Oncology Nurse [place] at [facility] Phone # [phone]  Fax # [phone]

## 2020-03-22 NOTE — Telephone Encounter (Signed)
Received call from patient asking about her next appt.  Advised that her next appt is 06/16/20 @ 10 am. Pt voiced understanding.

## 2020-03-23 ENCOUNTER — Telehealth: Payer: Self-pay | Admitting: Hematology and Oncology

## 2020-03-23 ENCOUNTER — Encounter: Payer: Self-pay | Admitting: Radiation Oncology

## 2020-03-23 ENCOUNTER — Other Ambulatory Visit: Payer: Self-pay

## 2020-03-23 DIAGNOSIS — C8331 Diffuse large B-cell lymphoma, lymph nodes of head, face, and neck: Secondary | ICD-10-CM

## 2020-03-23 NOTE — Telephone Encounter (Signed)
Scheduled appt per 3/15 sch msg. Called pt, no answer. Left vm with appt date and time.

## 2020-03-23 NOTE — Progress Notes (Signed)
ambulatory

## 2020-03-23 NOTE — Progress Notes (Signed)
Radiation Oncology         [phone] ________________________________  Name: MAKINNA ANDY MRN: [id number]  Date: 03/19/2020  DOB: January 09, 1936  Follow-Up Visit Note  CC: Aletha Halim., PA-C  Orson Slick, MD  Diagnosis and Prior Radiotherapy:       ICD-10-CM   1. Diffuse large B-cell lymphoma of lymph nodes of head (HCC)  C83.31     CHIEF COMPLAINT:  Here for follow-up and surveillance of lymphoma   Narrative:  The patient returns today for routine follow-up.   Ms. Riesen presents today for 2 week follow-up after completing radiation to her right tonsil and involved neck nodes on 02/27/2020  Pain issues, if any: Patient denies new pain. Patient reports joints are sore. Using a feeding tube?: N/A Weight changes, if any: Last visit patient weighed 133 lb. Current weight 118 lb. Swallowing issues, if any: Reports throat soreness at completion of treatment has resolved. Smoking or chewing tobacco? denies Using fluoride trays daily? N/A Last ENT visit was on: Not since diagnosis Other notable issues, if any: Had F/U with Dr. Narda Rutherford on 03/11/2020  Patient reports she has no taste or smell. She believe this is the contributing factor to why she has lost so much weight. Reports dry mouth. Hoarseness audible. Denies tinnitus.  BP (!) 129/49 (BP Location: Left Arm, Patient Position: Sitting)   Pulse 81   Temp 97.9 F (36.6 C) (Oral)   Resp 18   Ht 5\' 5"  (1.651 m)   Wt 118 lb 6.4 oz (53.7 kg)   SpO2 92%   BMI 19.70 kg/m  Wt Readings from Last 3 Encounters:  03/19/20 118 lb 6.4 oz (53.7 kg)  03/11/20 133 lb 6.4 oz (60.5 kg)  02/02/20 132 lb 9.6 oz (60.1 kg)                       ALLERGIES:  is allergic to other and codeine.  Meds: Current Outpatient Medications  Medication Sig Dispense Refill  . allopurinol (ZYLOPRIM) 300 MG tablet Take 300 mg by mouth daily.    Marland Kitchen ALPRAZolam (XANAX) 0.25 MG tablet Take 0.25 mg by mouth 2 (two) times daily.    Marland Kitchen amLODipine  (NORVASC) 5 MG tablet Take 5 mg by mouth daily.    Marland Kitchen aspirin EC 81 MG tablet Take 81 mg by mouth at bedtime.    Marland Kitchen atorvastatin (LIPITOR) 20 MG tablet Take 20 mg by mouth at bedtime.  11  . azithromycin (ZITHROMAX) 250 MG tablet Take 250 mg by mouth as directed.    . benzonatate (TESSALON) 200 MG capsule Take by mouth.    . budesonide-formoterol (SYMBICORT) 160-4.5 MCG/ACT inhaler Inhale 2 puffs into the lungs 2 (two) times daily as needed (for shortness of breath).     . Cholecalciferol (VITAMIN D3) 2000 UNITS TABS Take 2,000 Units by mouth at bedtime.     . gabapentin (NEURONTIN) 100 MG capsule TAKE 1 CAP EVERY MORNING, 1 CAP IN THE AFTERNOON, AND 2 CAPS EVERY DAY AT BEDTIME (Patient taking differently: Take 100-200 mg by mouth See admin instructions. Take 100mg  by mouth every morning, take 100mg  by mouth in the afternoon, and take 200mg  by mouth at bedtime) 360 capsule 2  . hydroxypropyl methylcellulose / hypromellose (ISOPTO TEARS / GONIOVISC) 2.5 % ophthalmic solution Place 1-2 drops into both eyes 3 (three) times daily as needed (to soothe dry eyes.).     Marland Kitchen levothyroxine (SYNTHROID) 88 MCG tablet Take  88 mcg by mouth daily.    Marland Kitchen lidocaine (XYLOCAINE) 2 % solution Patient: Mix 1part 2% viscous lidocaine, 1part H20. Swish & swallow 43mL of diluted mixture, 53min before meals and at bedtime, up to QID 200 mL 3  . lidocaine-prilocaine (EMLA) cream Apply 1 application topically as needed. 30 g 0  . metoprolol tartrate (LOPRESSOR) 25 MG tablet Take 0.5 tablets (12.5 mg total) by mouth 2 (two) times daily. (Patient taking differently: Take 12.5 mg by mouth daily.) 60 tablet 1  . mirtazapine (REMERON) 15 MG tablet Take 15 mg by mouth at bedtime.     . ondansetron (ZOFRAN ODT) 4 MG disintegrating tablet Take 1 tablet (4 mg total) by mouth every 8 (eight) hours as needed for nausea or vomiting. 20 tablet 0  . ondansetron (ZOFRAN) 8 MG tablet Take 1 tablet (8 mg total) by mouth every 8 (eight) hours as  needed for nausea or vomiting. 30 tablet 0  . pantoprazole (PROTONIX) 40 MG tablet Take 40 mg by mouth 2 (two) times daily.   11  . Potassium 99 MG TABS Take 99 mg by mouth at bedtime.    . prochlorperazine (COMPAZINE) 10 MG tablet Take 1 tablet (10 mg total) by mouth every 6 (six) hours as needed for nausea or vomiting. 30 tablet 0  . traMADol (ULTRAM) 50 MG tablet Take 50 mg by mouth 2 (two) times daily as needed for moderate pain.     . vitamin B-12 (CYANOCOBALAMIN) 1000 MCG tablet Take 1,000 mcg by mouth daily.     No current facility-administered medications for this encounter.    Physical Findings: The patient is in no acute distress. Patient is alert and oriented. Wt Readings from Last 3 Encounters:  03/19/20 118 lb 6.4 oz (53.7 kg)  03/11/20 133 lb 6.4 oz (60.5 kg)  02/02/20 132 lb 9.6 oz (60.1 kg)    height is 5\' 5"  (1.651 m) and weight is 118 lb 6.4 oz (53.7 kg). Her oral temperature is 97.9 F (36.6 C). Her blood pressure is 129/49 (abnormal) and her pulse is 81. Her respiration is 18 and oxygen saturation is 92%. .  General: Alert and oriented, in no acute distress HEENT: Head is normocephalic. Extraocular movements are intact. Oropharynx is notable for satisfactory healing with no residual mucositis or thrush.  No visible tumor. Neck: Neck is notable for satisfactory healing of the skin.  No palpable masses in the cervical or supraclavicular regions Skin: Skin in treatment fields shows satisfactory healing in the irradiated fields Psychiatric: Judgment and insight are intact. Affect is appropriate.   Lab Findings: Lab Results  Component Value Date   WBC 6.2 03/11/2020   HGB 11.6 (L) 03/11/2020   HCT 35.6 (L) 03/11/2020   MCV 100.6 (H) 03/11/2020   PLT 246 03/11/2020    Lab Results  Component Value Date   TSH 1.058 07/28/2018    Radiographic Findings: No results found.  Impression/Plan:    1) Head and Neck Cancer Status: Healing from involved site  radiotherapy  2) Nutritional Status: She is still losing weight.  She attributes this to dysgeusia.  We will make a referral to our dietitian for further counseling.  She missed past appointments with them after previous referral. I spoke with her today about techniques to boost her calories and protein PEG tube: none  3)  Swallowing: functional, no active issues  4) Thyroid function: Unlikely to be affected by past radiation due to limited dose and fields.  Follow as  clinically indicated with primary doctor Lab Results  Component Value Date   TSH 1.058 07/28/2018    5) Other: She will follow up with medical oncology closely.  I will see her back on an as-needed basis. Her main need is boosting her nutrition.  Unfortunately she somehow missed the past appointments that were made for her with her dietitian.  She will employ the techniques that we discussed today and await a phone call with her appointment with one of our dietitians.  Anticipate her dysgeusia to be temporary and to resolve slowly over the next months.  I wished her the very best.  On date of service, in total, I spent 20 minutes on this encounter. Patient was seen in person. _____________________________________   Eppie Gibson, MD

## 2020-03-25 ENCOUNTER — Telehealth: Payer: Self-pay | Admitting: *Deleted

## 2020-03-25 NOTE — Telephone Encounter (Signed)
RETURNED PATIENT'S PHONE CALL, RESCHEDULED NUTRITION APPT. FOR 03-30-20 @ 9:45 AM WITH Amy [name], PATIENT IS GOOD WITH THIS DAY AND TIME

## 2020-03-26 ENCOUNTER — Other Ambulatory Visit: Payer: Self-pay | Admitting: Hematology and Oncology

## 2020-03-30 ENCOUNTER — Telehealth: Payer: Self-pay

## 2020-03-30 ENCOUNTER — Inpatient Hospital Stay: Payer: Medicare Other

## 2020-03-30 NOTE — Telephone Encounter (Signed)
Nutrition  Patient did not show up for 9:45 nutrition appointment this am.    RD called patient this pm.  Patient said she called at 7:15 am this morning to cancel nutrition appointment because she did not feel good.  RD did not received this message.  Asked patient if she wanted to reschedule nutrition appointment or if wanted to speak with RD over the phone and patient declined saying she was waiting on a phone call about her insurance right now.  Let patient know that RD was available if she wanted to reschedule in the future.    Message sent to provided and nursing.   Amy [name] B. Amy [name], Amy [name], Amy [name] Registered Dietitian [phone] (mobile)

## 2020-03-31 ENCOUNTER — Telehealth: Payer: Self-pay | Admitting: *Deleted

## 2020-03-31 ENCOUNTER — Inpatient Hospital Stay: Payer: Medicare Other

## 2020-03-31 NOTE — Telephone Encounter (Signed)
Received call from patient requesting to have an appt with Dr. Lorenso Courier to discuss getting her port removed.  Scheduling message sent for an appt in the next 1-2  weeks

## 2020-04-01 ENCOUNTER — Telehealth: Payer: Self-pay | Admitting: Hematology and Oncology

## 2020-04-01 NOTE — Telephone Encounter (Signed)
Scheduled appt per 3/23 sch msg. Pt aware.

## 2020-04-09 ENCOUNTER — Telehealth: Payer: Self-pay | Admitting: *Deleted

## 2020-04-09 ENCOUNTER — Other Ambulatory Visit: Payer: Self-pay | Admitting: *Deleted

## 2020-04-09 DIAGNOSIS — C8331 Diffuse large B-cell lymphoma, lymph nodes of head, face, and neck: Secondary | ICD-10-CM

## 2020-04-09 DIAGNOSIS — Z95828 Presence of other vascular implants and grafts: Secondary | ICD-10-CM

## 2020-04-09 NOTE — Telephone Encounter (Signed)
Received call from patient requesting to have her port removed.  Received order for port removal from Dr. Lorenso Courier. Call made to Tiffany in IR to schedule.

## 2020-04-12 ENCOUNTER — Inpatient Hospital Stay: Payer: Medicare Other | Admitting: Hematology and Oncology

## 2020-04-28 NOTE — Progress Notes (Signed)
  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   Plan: The patient will follow-up with radiation oncology in 2-3wks.  -----------------------------------  Eppie Gibson, MD

## 2020-04-30 ENCOUNTER — Other Ambulatory Visit: Payer: Self-pay | Admitting: Student

## 2020-05-03 ENCOUNTER — Ambulatory Visit (HOSPITAL_COMMUNITY)
Admission: RE | Admit: 2020-05-03 | Discharge: 2020-05-03 | Disposition: A | Discharge status: Home / Self Care | Payer: Medicare Other | Source: Ambulatory Visit | Attending: Hematology and Oncology | Admitting: Hematology and Oncology

## 2020-05-03 ENCOUNTER — Other Ambulatory Visit: Payer: Self-pay

## 2020-05-03 DIAGNOSIS — Z95828 Presence of other vascular implants and grafts: Secondary | ICD-10-CM

## 2020-05-03 DIAGNOSIS — Z452 Encounter for adjustment and management of vascular access device: Secondary | ICD-10-CM | POA: Diagnosis not present

## 2020-05-03 DIAGNOSIS — C8331 Diffuse large B-cell lymphoma, lymph nodes of head, face, and neck: Secondary | ICD-10-CM

## 2020-05-03 HISTORY — PX: IR REMOVAL TUN ACCESS W/ PORT W/O FL MOD SED: IMG2290

## 2020-05-03 MED ORDER — SODIUM CHLORIDE 0.9 % IV SOLN: INTRAVENOUS | Status: DC

## 2020-05-03 MED ORDER — LIDOCAINE-EPINEPHRINE 1 %-1:100000 IJ SOLN
INTRAMUSCULAR | Status: AC
  Filled 2020-05-03: qty 1

## 2020-05-03 MED ORDER — LIDOCAINE-EPINEPHRINE 1 %-1:100000 IJ SOLN
INTRAMUSCULAR | Status: AC | PRN
  Administered 2020-05-03: 10 mL

## 2020-05-03 NOTE — Progress Notes (Signed)
Patient did not receive any sedation for her procedure. Discharged to front entrance via wheelchair. Daughter and son in law in [place].

## 2020-05-03 NOTE — Procedures (Signed)
Interventional Radiology Procedure Note  Procedure: rt ij port removal    Complications: None  Estimated Blood Loss:  min  Findings: Full report in pacs    Tamera Punt, MD

## 2020-05-03 NOTE — Progress Notes (Signed)
Patient ID: Amy [name], female   DOB: Feb 21, 1935, 85 y.o.   MRN: [id number] Patient presents to IR department today for Port-A-Cath removal.  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 She is no longer using her port.  Details/risks of procedure, including but not limited to, internal bleeding, infection, injury to adjacent structures discussed with patient with her understanding and consent.  She does not wish to receive IV conscious sedation for the procedure.

## 2020-05-03 NOTE — Discharge Instructions (Signed)
Interventional radiology phone numbers [phone] After hours [phone]  Implanted Port Removal, Care After This sheet gives you information about how to care for yourself after your procedure. Your health care provider may also give you more specific instructions. If you have problems or questions, contact your health care provider. What can I expect after the procedure? After the procedure, it is common to have:  Soreness or pain near your incision.  Some swelling or bruising near your incision. Follow these instructions at home: Medicines  Take over-the-counter and prescription medicines only as told by your health care provider.  If you were prescribed an antibiotic medicine, take it as told by your health care provider. Do not stop taking the antibiotic even if you start to feel better. Bathing 1. Do not take baths, swim, or use a hot tub until your health care provider approves. You may shower tomorrow. Remove your dressing prior to your shower. Incision care 1. Follow instructions from your health care provider about how to take care of your incision. Make sure you: ? Wash your hands with soap and water before you change your bandage (dressing). If soap and water are not available, use hand sanitizer. ? Keep your dressing dry. ? Leave  skin glue in place. These skin closures may need to stay in place for 2 weeks or longer. . 2. Check your incision area every day for signs of infection. Check for: ? More redness, swelling, or pain. ? More fluid or blood. ? Warmth. ? Pus or a bad smell.   Driving  Do not drive for 24 hours if you were given a medicine to help you relax (sedative) during your procedure.  If you did not receive a sedative, ask your health care provider when it is safe to drive.   Activity  Return to your normal activities as told by your health care provider. Ask your health care provider what activities are safe for you.  Do not lift anything that is  heavier than 10 lb (4.5 kg), or the limit that you are told, until your health care provider says that it is safe.  Do not do activities that involve lifting your arms over your head. General instructions  Do not use any products that contain nicotine or tobacco, such as cigarettes and e-cigarettes. These can delay healing. If you need help quitting, ask your health care provider.  Keep all follow-up visits as told by your health care provider. This is important. Contact a health care provider if:  You have more redness, swelling, or pain around your incision.  You have more fluid or blood coming from your incision.  Your incision feels warm to the touch.  You have pus or a bad smell coming from your incision.  You have pain that is not relieved by your pain medicine. Get help right away if you have:  A fever or chills.  Chest pain.  Difficulty breathing. Summary  After the procedure, it is common to have pain, soreness, swelling, or bruising near your incision.  If you were prescribed an antibiotic medicine, take it as told by your health care provider. Do not stop taking the antibiotic even if you start to feel better.  Do not drive for 24 hours if you were given a sedative during your procedure.  Return to your normal activities as told by your health care provider. Ask your health care provider what activities are safe for you. This information is not intended to replace advice given to   you by your health care provider. Make sure you discuss any questions you have with your health care provider. Document Revised: 02/08/2017 Document Reviewed: 02/08/2017 Elsevier Patient Education  2021 Elsevier Inc.      

## 2020-06-16 ENCOUNTER — Inpatient Hospital Stay: Payer: Medicare Other

## 2020-06-16 ENCOUNTER — Other Ambulatory Visit: Payer: Self-pay | Admitting: Hematology and Oncology

## 2020-06-16 ENCOUNTER — Other Ambulatory Visit: Payer: Self-pay

## 2020-06-16 ENCOUNTER — Inpatient Hospital Stay: Payer: Medicare Other | Attending: Hematology and Oncology | Admitting: Hematology and Oncology

## 2020-06-16 VITALS — BP 122/65 | HR 67 | Temp 98.2°F | Resp 17 | Wt 125.6 lb

## 2020-06-16 DIAGNOSIS — I4891 Unspecified atrial fibrillation: Secondary | ICD-10-CM | POA: Diagnosis not present

## 2020-06-16 DIAGNOSIS — Z8572 Personal history of non-Hodgkin lymphomas: Secondary | ICD-10-CM | POA: Insufficient documentation

## 2020-06-16 DIAGNOSIS — C8331 Diffuse large B-cell lymphoma, lymph nodes of head, face, and neck: Secondary | ICD-10-CM

## 2020-06-16 DIAGNOSIS — Z7982 Long term (current) use of aspirin: Secondary | ICD-10-CM | POA: Insufficient documentation

## 2020-06-16 DIAGNOSIS — Z79899 Other long term (current) drug therapy: Secondary | ICD-10-CM | POA: Diagnosis not present

## 2020-06-16 DIAGNOSIS — Z9221 Personal history of antineoplastic chemotherapy: Secondary | ICD-10-CM | POA: Diagnosis not present

## 2020-06-16 DIAGNOSIS — J449 Chronic obstructive pulmonary disease, unspecified: Secondary | ICD-10-CM | POA: Insufficient documentation

## 2020-06-16 DIAGNOSIS — Z87891 Personal history of nicotine dependence: Secondary | ICD-10-CM | POA: Diagnosis not present

## 2020-06-16 DIAGNOSIS — Z7951 Long term (current) use of inhaled steroids: Secondary | ICD-10-CM | POA: Diagnosis not present

## 2020-06-16 DIAGNOSIS — E039 Hypothyroidism, unspecified: Secondary | ICD-10-CM | POA: Diagnosis not present

## 2020-06-16 DIAGNOSIS — Z923 Personal history of irradiation: Secondary | ICD-10-CM | POA: Insufficient documentation

## 2020-06-16 DIAGNOSIS — E785 Hyperlipidemia, unspecified: Secondary | ICD-10-CM | POA: Insufficient documentation

## 2020-06-16 LAB — CMP (CANCER CENTER ONLY)
ALT: 14 U/L (ref 0–44)
AST: 28 U/L (ref 15–41)
Albumin: 3.9 g/dL (ref 3.5–5.0)
Alkaline Phosphatase: 135 U/L — ABNORMAL HIGH (ref 38–126)
Anion gap: 11 (ref 5–15)
BUN: 8 mg/dL (ref 8–23)
CO2: 35 mmol/L — ABNORMAL HIGH (ref 22–32)
Calcium: 9.7 mg/dL (ref 8.9–10.3)
Chloride: 97 mmol/L — ABNORMAL LOW (ref 98–111)
Creatinine: 0.82 mg/dL (ref 0.44–1.00)
GFR, Estimated: >60 mL/min (ref >60)
Glucose, Bld: 102 mg/dL — ABNORMAL HIGH (ref 70–99)
Potassium: 3.3 mmol/L — ABNORMAL LOW (ref 3.5–5.1)
Sodium: 143 mmol/L (ref 135–145)
Total Bilirubin: 0.8 mg/dL (ref 0.3–1.2)
Total Protein: 7.4 g/dL (ref 6.5–8.1)

## 2020-06-16 LAB — CBC WITH DIFFERENTIAL (CANCER CENTER ONLY)
Abs Immature Granulocytes: 0.01 10*3/uL (ref 0.00–0.07)
Basophils Absolute: 0 10*3/uL (ref 0.0–0.1)
Basophils Relative: 1 %
Eosinophils Absolute: 0.2 10*3/uL (ref 0.0–0.5)
Eosinophils Relative: 3 %
HCT: 41.3 % (ref 36.0–46.0)
Hemoglobin: 13.5 g/dL (ref 12.0–15.0)
Immature Granulocytes: 0 %
Lymphocytes Relative: 52 %
Lymphs Abs: 3.4 10*3/uL (ref 0.7–4.0)
MCH: 31.3 pg (ref 26.0–34.0)
MCHC: 32.7 g/dL (ref 30.0–36.0)
MCV: 95.8 fL (ref 80.0–100.0)
Monocytes Absolute: 0.7 10*3/uL (ref 0.1–1.0)
Monocytes Relative: 10 %
Neutro Abs: 2.2 10*3/uL (ref 1.7–7.7)
Neutrophils Relative %: 34 %
Platelet Count: 251 10*3/uL (ref 150–400)
RBC: 4.31 MIL/uL (ref 3.87–5.11)
RDW: 14.4 % (ref 11.5–15.5)
WBC Count: 6.4 10*3/uL (ref 4.0–10.5)
nRBC: 0 % (ref 0.0–0.2)

## 2020-06-16 LAB — LACTATE DEHYDROGENASE: LDH: 216 U/L — ABNORMAL HIGH (ref 98–192)

## 2020-06-16 NOTE — Progress Notes (Signed)
[facility] [phone]   [phone]  PROGRESS NOTE  Patient Care Team: Aletha Halim., PA-C as PCP - General (Family Medicine) Eppie Gibson, MD as Consulting Physician (Radiation Oncology) Malmfelt, Stephani Police, RN as Oncology Nurse Navigator Orson Slick, MD as Consulting Physician (Hematology and Oncology)  Hematological/Oncological History # Diffuse Large B Cell Lymphoma, Stage II 1) 09/26/2019: CT scan of neck performed due to persistent tonsillar lesion after outpatient antibiotic course. CT scan showed a heterogeneous right tonsillar mass up to 3.2 cm and a small but suspicious right level 2A lymph node  2) 09/30/2019: patient underwent biopsy with ENT which revealed a DLBCL with FISH for MYC, BCL2 and BCL6 pending 3) 10/08/2019: establish care with Dr. Lorenso Courier  4) 10/21/2019: PET scan showed hypermetabolic right tonsillar mass and adjacent right level 2 adenopathy, consistent with the given history of lymphoma. 5) 11/13/2019: Cycle 1 Day 1 of R-miniCHOP 6) 12/05/2019: Cycle 2 Day 1 of R-miniCHOP 7) 12/25/2019: Cycle 3 Day 1 of R-miniCHOP 8) 02/04/2020-02/27/2020: radiation therapy.   Interval History:  Amy [name] 85 y.o. female with medical history significant for Stage II DLBCL of the tonsil who presents for a follow up visit. The patient's last visit was on 03/11/2020. In the interim since the last visit she has had no major changes in her health.  On exam today Amy [name] reports that she has had fluctuations in her appetite and notes that it "comes and goes".  She has decreased 225 pounds down from 133 at her last visit.  She reports that she eats several small meals throughout the day.  She denies having any bumps or lumps in any of her lymph nodes.  She also denies having any issues with fevers, chills, sweats, nausea, vomiting or diarrhea.  She reports that she did have some issues with swelling of the feet for which she has discontinued  eating salty foods like bacon.  At this time there are no signs or symptoms concerning for recurrence.  A full 10 point ROS is listed below.  MEDICAL HISTORY:  Past Medical History:  Diagnosis Date  . Anxiety   . Arthritis   . Atrial fibrillation ([place])   . COPD (chronic obstructive pulmonary disease) ([place])   . Esophageal stricture    s/p repeated dilations, Dr. Watt Climes  . GERD (gastroesophageal reflux disease)   . Hearing loss bilateral   has hearing aids  . Heart murmur   . Hiatal hernia   . History of blood transfusion    many years ago   . Hyperlipidemia   . Hypothyroidism   . Osteoarthritis   . Osteoporosis   . Pulmonary nodules   . Renal lesion    1cm left kidney    SURGICAL HISTORY: Past Surgical History:  Procedure Laterality Date  . ABDOMINAL HYSTERECTOMY    . BREAST ENHANCEMENT SURGERY  2006  . CATARACT EXTRACTION W/ INTRAOCULAR LENS  IMPLANT, BILATERAL  '09  . ESOPHAGEAL DILATION    . IR IMAGING GUIDED PORT INSERTION  11/05/2019  . IR REMOVAL TUN ACCESS W/ PORT W/O FL MOD SED  05/03/2020  . KNEE ARTHROSCOPY  02/22/2011   Procedure: ARTHROSCOPY KNEE;  Surgeon: Gearlean Alf, MD;  Location: [facility];  Service: Orthopedics;  Laterality: Right;  WITH DEBRIDEMENT   . LAPAROSCOPIC SMALL BOWEL RESECTION N/A 06/02/2016   Procedure: DIAGNOSTIC LAPAROSCOPY SMALL BOWEL RESECTION;  Surgeon: Armandina Gemma, MD;  Location: [facility];  Service: General;  Laterality: N/A;    SOCIAL HISTORY: Social History   Socioeconomic History  . Marital status: Widowed    Spouse name: Not on file  . Number of children: 3  . Years of education: 36  . Highest education level: Not on file  Occupational History  . Not on file  Tobacco Use  . Smoking status: Former Smoker    Packs/day: 1.00    Years: 30.00    Pack years: 30.00    Types: Cigarettes    Quit date: 01/09/1997    Years since quitting: 23.4  . Smokeless tobacco: Never Used  Vaping Use  . Vaping Use: Never  used  Substance and Sexual Activity  . Alcohol use: No  . Drug use: No  . Sexual activity: Not Currently  Other Topics Concern  . Not on file  Social History Narrative   Right handed    Live alone   Caffeine use: 1 cup coffee every morning   Social Determinants of Health   Financial Resource Strain: Not on file  Food Insecurity: Not on file  Transportation Needs: Not on file  Physical Activity: Not on file  Stress: Not on file  Social Connections: Not on file  Intimate Partner Violence: Not on file    FAMILY HISTORY: Family History  Problem Relation Age of Onset  . Heart disease Father   . Rheum arthritis Father   . Heart failure Mother   . Diabetes Brother     ALLERGIES:  is allergic to other and codeine.  MEDICATIONS:  Current Outpatient Medications  Medication Sig Dispense Refill  . allopurinol (ZYLOPRIM) 300 MG tablet TAKE 1 TABLET BY MOUTH EVERY DAY 90 tablet 1  . ALPRAZolam (XANAX) 0.25 MG tablet Take 0.25 mg by mouth 2 (two) times daily.    Marland Kitchen amLODipine (NORVASC) 5 MG tablet Take 5 mg by mouth daily.    Marland Kitchen aspirin EC 81 MG tablet Take 81 mg by mouth at bedtime.    Marland Kitchen atorvastatin (LIPITOR) 20 MG tablet Take 20 mg by mouth at bedtime.  11  . budesonide-formoterol (SYMBICORT) 160-4.5 MCG/ACT inhaler Inhale 2 puffs into the lungs 2 (two) times daily as needed (for shortness of breath).     . Cholecalciferol (VITAMIN D3) 2000 UNITS TABS Take 2,000 Units by mouth at bedtime.     . gabapentin (NEURONTIN) 100 MG capsule TAKE 1 CAP EVERY MORNING, 1 CAP IN THE AFTERNOON, AND 2 CAPS EVERY DAY AT BEDTIME (Patient taking differently: Take 100-200 mg by mouth See admin instructions. Take 100mg  by mouth every morning, take 100mg  by mouth in the afternoon, and take 200mg  by mouth at bedtime) 360 capsule 2  . hydroxypropyl methylcellulose / hypromellose (ISOPTO TEARS / GONIOVISC) 2.5 % ophthalmic solution Place 1-2 drops into both eyes 3 (three) times daily as needed (to soothe dry  eyes.).     Marland Kitchen levothyroxine (SYNTHROID) 88 MCG tablet Take 88 mcg by mouth daily.    Marland Kitchen lidocaine (XYLOCAINE) 2 % solution Patient: Mix 1part 2% viscous lidocaine, 1part H20. Swish & swallow 67mL of diluted mixture, 71min before meals and at bedtime, up to QID (Patient not taking: Reported on 06/16/2020) 200 mL 3  . lidocaine-prilocaine (EMLA) cream Apply 1 application topically as needed. 30 g 0  . metoprolol tartrate (LOPRESSOR) 25 MG tablet Take 0.5 tablets (12.5 mg total) by mouth 2 (two) times daily. (Patient taking differently: Take 12.5 mg by mouth daily.) 60 tablet 1  . mirtazapine (REMERON) 15 MG tablet Take  15 mg by mouth at bedtime.     . ondansetron (ZOFRAN ODT) 4 MG disintegrating tablet Take 1 tablet (4 mg total) by mouth every 8 (eight) hours as needed for nausea or vomiting. (Patient not taking: Reported on 06/16/2020) 20 tablet 0  . ondansetron (ZOFRAN) 8 MG tablet Take 1 tablet (8 mg total) by mouth every 8 (eight) hours as needed for nausea or vomiting. (Patient not taking: Reported on 06/16/2020) 30 tablet 0  . pantoprazole (PROTONIX) 40 MG tablet Take 40 mg by mouth 2 (two) times daily.   11  . Potassium 99 MG TABS Take 99 mg by mouth at bedtime.    . prochlorperazine (COMPAZINE) 10 MG tablet Take 1 tablet (10 mg total) by mouth every 6 (six) hours as needed for nausea or vomiting. (Patient not taking: Reported on 06/16/2020) 30 tablet 0  . traMADol (ULTRAM) 50 MG tablet Take 50 mg by mouth 2 (two) times daily as needed for moderate pain.     . vitamin B-12 (CYANOCOBALAMIN) 1000 MCG tablet Take 1,000 mcg by mouth daily.     No current facility-administered medications for this visit.    REVIEW OF SYSTEMS:   Constitutional: ( - ) fevers, ( - )  chills , ( - ) night sweats Eyes: ( - ) blurriness of vision, ( - ) double vision, ( - ) watery eyes Ears, nose, mouth, throat, and face: ( - ) mucositis, ( - ) sore throat Respiratory: ( - ) cough, ( - ) dyspnea, ( - ) wheezes Cardiovascular:  ( - ) palpitation, ( - ) chest discomfort, ( - ) lower extremity swelling Gastrointestinal:  ( - ) nausea, ( - ) heartburn, ( - ) change in bowel habits Skin: ( - ) abnormal skin rashes Lymphatics: ( - ) new lymphadenopathy, ( - ) easy bruising Neurological: ( - ) numbness, ( - ) tingling, ( - ) new weaknesses Behavioral/Psych: ( - ) mood change, ( - ) new changes  All other systems were reviewed with the patient and are negative.  PHYSICAL EXAMINATION: ECOG PERFORMANCE STATUS: 2 - Symptomatic, <50% confined to bed  Vitals:   06/16/20 1025  BP: 122/65  Pulse: 67  Resp: 17  Temp: 98.2 F (36.8 C)  SpO2: 91%   Filed Weights   06/16/20 1025  Weight: 125 lb 9.6 oz (57 kg)    GENERAL: well appearing elderly Caucasian female in NAD  SKIN: skin color, texture, turgor are normal, no rashes or significant lesions EYES: conjunctiva are pink and non-injected, sclera clear LUNGS: clear to auscultation and percussion with normal breathing effort HEART: regular rate & rhythm and no murmurs and no lower extremity edema Musculoskeletal: no cyanosis of digits and no clubbing  PSYCH: alert & oriented x 3, fluent speech NEURO: no focal motor/sensory deficits  LABORATORY DATA:  I have reviewed the data as listed CBC Latest Ref Rng & Units 06/16/2020 03/11/2020 02/02/2020  WBC 4.0 - 10.5 K/uL 6.4 6.2 7.5  Hemoglobin 12.0 - 15.0 g/dL 13.5 11.6(L) 11.0(L)  Hematocrit 36.0 - 46.0 % 41.3 35.6(L) 33.9(L)  Platelets 150 - 400 K/uL 251 246 264    CMP Latest Ref Rng & Units 06/16/2020 03/11/2020 02/02/2020  Glucose 70 - 99 mg/dL 102(H) 106(H) 101(H)  BUN 8 - 23 mg/dL 8 8 9   Creatinine 0.44 - 1.00 mg/dL 0.82 0.69 0.72  Sodium 135 - 145 mmol/L 143 139 139  Potassium 3.5 - 5.1 mmol/L 3.3(L) 3.9 4.0  Chloride 98 -  111 mmol/L 97(L) 103 104  CO2 22 - 32 mmol/L 35(H) 29 28  Calcium 8.9 - 10.3 mg/dL 9.7 8.9 9.2  Total Protein 6.5 - 8.1 g/dL 7.4 6.8 6.9  Total Bilirubin 0.3 - 1.2 mg/dL 0.8 0.6 0.5  Alkaline  Phos 38 - 126 U/L 135(H) 133(H) 144(H)  AST 15 - 41 U/L 28 26 29   ALT 0 - 44 U/L 14 16 25    RADIOGRAPHIC STUDIES: No results found.  ASSESSMENT & PLAN Amy [name] 85 y.o. female with medical history significant for Stage II DLBCL of the tonsil who presents for a follow up visit.    After review the labs, the records, discussion with the patient the findings most consistent with a stage II diffuse large B-cell lymphoma of the tonsil.  She has involvement of the right tonsil as well as axillary lymph nodes which would constitute stage II disease.  She does have some mild activity in other lymph nodes throughout the body, but nowhere near as active as the ones within her head neck.  Lymphomas involvement of these lymph nodes is not likely and therefore I would proceed with treatment as if the patient were a stage II.  Previously we discussed the diagnosis of diffuse large B-cell lymphoma and the treatment options moving forward.  The regimen most recommended for this patient would be R mini CHOP with consideration for 3 cycles followed by radiation to the local lymph nodes.  This is consistent with the treatment course to be recommended with R-CHOP chemotherapy for stage II disease.  We are still currently waiting for the results of the double hit panel, however this would not likely change management as the patient would not be able to tolerate full strength chemotherapy with something like R-EPOCH.  We also discussed the risks and benefits of this R-CHOP chemotherapy including constipation, neurotoxicity, cardiac toxicity, drop in blood counts, fatigue, nausea, vomiting, and alopecia.  The patient voiced her understanding of these complications and wished to proceed forward with treatment.  The regimen of R- miniCHOP consists of rituximab 325 mg per metered squared, cyclophosphamide 400 mg per metered squared IV, doxorubicin 25 mg per metered squared IV, vincristine 1 mg IV, and prednisone 40 mg per  metered squared p.o. on days 1 through 5.  This will be pursued for 3 cycles of chemotherapy with the addition of radiation therapy.  Based on NCCN recommendations, member institutes do practice this regimen (short course chemo + RT), though data is not robust.  # Diffuse Large B Cell Lymphoma, Stage II --complete response noted on PET CT scan on 01/08/2020 --completed 3 cycles of R-mini-CHOP  followed by localized radiation.  -- TTE shows EF 60-65% with mild diastolic dysfunction --patient has established care with Dr. Isidore Moos and underwent radiation therapy which ended on 02/27/2020.  --repeat CT scan can be performed every 6 months x 2 years, then only as clinically indicated.  --RTC in 3 months time for continued f/u.   #Supportive Care --chemotherapy education completed --port removed on 05/03/2020 -- EMLA cream for port (as need for lab draws) -- no pain medication required at this time.   Orders Placed This Encounter  Procedures  . CT Chest Wo Contrast    Standing Status:   Future    Standing Expiration Date:   06/16/2021    Order Specific Question:   Preferred imaging location?    Answer:   [facility]  . CT Abdomen Pelvis Wo Contrast    Standing  Status:   Future    Standing Expiration Date:   06/16/2021    Order Specific Question:   Preferred imaging location?    Answer:   [facility]    Order Specific Question:   Is Oral Contrast requested for this exam?    Answer:   Yes, Per Radiology protocol   All questions were answered. The patient knows to call the clinic with any problems, questions or concerns.  A total of more than 30 minutes were spent on this encounter and over half of that time was spent on counseling and coordination of care as outlined above.   Ledell Peoples, MD Department of Hematology/Oncology [place] at [facility] Phone: [phone] Pager: [phone] Email: [email or web address]  06/16/2020 11:02 AM

## 2020-06-18 ENCOUNTER — Telehealth: Payer: Self-pay | Admitting: Hematology and Oncology

## 2020-06-18 NOTE — Telephone Encounter (Signed)
Scheduled per los. Called and spoke with patient. Confirmed appts  

## 2020-06-23 ENCOUNTER — Other Ambulatory Visit (HOSPITAL_BASED_OUTPATIENT_CLINIC_OR_DEPARTMENT_OTHER): Payer: Self-pay | Admitting: Family Medicine

## 2020-06-23 ENCOUNTER — Ambulatory Visit (HOSPITAL_COMMUNITY)
Admission: RE | Admit: 2020-06-23 | Discharge: 2020-06-23 | Disposition: A | Discharge status: Home / Self Care | Payer: Medicare Other | Source: Ambulatory Visit | Attending: Family Medicine | Admitting: Family Medicine

## 2020-06-23 ENCOUNTER — Other Ambulatory Visit: Payer: Self-pay

## 2020-06-23 DIAGNOSIS — R509 Fever, unspecified: Secondary | ICD-10-CM | POA: Insufficient documentation

## 2020-06-23 DIAGNOSIS — R112 Nausea with vomiting, unspecified: Secondary | ICD-10-CM

## 2020-06-23 DIAGNOSIS — C8331 Diffuse large B-cell lymphoma, lymph nodes of head, face, and neck: Secondary | ICD-10-CM | POA: Insufficient documentation

## 2020-06-24 ENCOUNTER — Ambulatory Visit (HOSPITAL_COMMUNITY)
Admission: RE | Admit: 2020-06-24 | Discharge: 2020-06-24 | Disposition: A | Discharge status: Home / Self Care | Payer: Medicare Other | Source: Ambulatory Visit | Attending: Hematology and Oncology | Admitting: Hematology and Oncology

## 2020-06-24 ENCOUNTER — Other Ambulatory Visit: Payer: Self-pay

## 2020-06-24 DIAGNOSIS — C8331 Diffuse large B-cell lymphoma, lymph nodes of head, face, and neck: Secondary | ICD-10-CM

## 2020-06-29 ENCOUNTER — Encounter: Payer: Self-pay | Admitting: Hematology and Oncology

## 2020-07-14 ENCOUNTER — Telehealth: Payer: Self-pay | Admitting: *Deleted

## 2020-07-14 NOTE — Telephone Encounter (Signed)
Call made to patient after receiving message that she had sent to on call service this morning. Pt is havibng ongoing nausea, unable to eat and losing weight.  Advised that Dr. Lorenso Courier had spoken to Dr. Watt Climes about the results of her recent CT scan. Dr. Watt Climes felt that Ms. Cleckley needs to contact her surgeon who performed her surgery as the issues are near the surgical site.  Pt was not told this.  Advised pt to call her surgeon. She voiced understanding and will call.

## 2020-08-09 ENCOUNTER — Other Ambulatory Visit: Payer: Self-pay | Admitting: Family Medicine

## 2020-08-09 DIAGNOSIS — Z1231 Encounter for screening mammogram for malignant neoplasm of breast: Secondary | ICD-10-CM

## 2020-08-24 ENCOUNTER — Telehealth: Payer: Self-pay

## 2020-08-24 NOTE — Telephone Encounter (Signed)
Referral notes received from [facility], Phone #: [phone], Fax #: [phone]   A copy of the notes have been placed in the scheduling box for check-out to pick-up and to enter referral. Original notes placed in file cabinet.

## 2020-09-20 ENCOUNTER — Other Ambulatory Visit: Payer: Self-pay

## 2020-09-20 ENCOUNTER — Inpatient Hospital Stay: Payer: Medicare Other | Attending: Hematology and Oncology

## 2020-09-20 ENCOUNTER — Inpatient Hospital Stay (HOSPITAL_BASED_OUTPATIENT_CLINIC_OR_DEPARTMENT_OTHER): Payer: Medicare Other | Admitting: Hematology and Oncology

## 2020-09-20 ENCOUNTER — Other Ambulatory Visit: Payer: Self-pay | Admitting: Hematology and Oncology

## 2020-09-20 VITALS — BP 116/58 | HR 77 | Temp 98.5°F | Resp 17 | Ht 65.0 in | Wt 121.1 lb

## 2020-09-20 DIAGNOSIS — Z79899 Other long term (current) drug therapy: Secondary | ICD-10-CM | POA: Insufficient documentation

## 2020-09-20 DIAGNOSIS — C8331 Diffuse large B-cell lymphoma, lymph nodes of head, face, and neck: Secondary | ICD-10-CM | POA: Diagnosis not present

## 2020-09-20 DIAGNOSIS — Z87891 Personal history of nicotine dependence: Secondary | ICD-10-CM | POA: Insufficient documentation

## 2020-09-20 DIAGNOSIS — Z7951 Long term (current) use of inhaled steroids: Secondary | ICD-10-CM | POA: Diagnosis not present

## 2020-09-20 DIAGNOSIS — Z8572 Personal history of non-Hodgkin lymphomas: Secondary | ICD-10-CM | POA: Insufficient documentation

## 2020-09-20 DIAGNOSIS — E039 Hypothyroidism, unspecified: Secondary | ICD-10-CM | POA: Insufficient documentation

## 2020-09-20 DIAGNOSIS — Z7982 Long term (current) use of aspirin: Secondary | ICD-10-CM | POA: Insufficient documentation

## 2020-09-20 DIAGNOSIS — Z923 Personal history of irradiation: Secondary | ICD-10-CM | POA: Insufficient documentation

## 2020-09-20 DIAGNOSIS — Z9221 Personal history of antineoplastic chemotherapy: Secondary | ICD-10-CM | POA: Insufficient documentation

## 2020-09-20 DIAGNOSIS — I4891 Unspecified atrial fibrillation: Secondary | ICD-10-CM | POA: Diagnosis not present

## 2020-09-20 LAB — CBC WITH DIFFERENTIAL (CANCER CENTER ONLY)
Abs Immature Granulocytes: 0.01 10*3/uL (ref 0.00–0.07)
Basophils Absolute: 0 10*3/uL (ref 0.0–0.1)
Basophils Relative: 1 %
Eosinophils Absolute: 0.2 10*3/uL (ref 0.0–0.5)
Eosinophils Relative: 2 %
HCT: 35.6 % — ABNORMAL LOW (ref 36.0–46.0)
Hemoglobin: 11.8 g/dL — ABNORMAL LOW (ref 12.0–15.0)
Immature Granulocytes: 0 %
Lymphocytes Relative: 47 %
Lymphs Abs: 3.1 10*3/uL (ref 0.7–4.0)
MCH: 32.9 pg (ref 26.0–34.0)
MCHC: 33.1 g/dL (ref 30.0–36.0)
MCV: 99.2 fL (ref 80.0–100.0)
Monocytes Absolute: 0.5 10*3/uL (ref 0.1–1.0)
Monocytes Relative: 8 %
Neutro Abs: 2.8 10*3/uL (ref 1.7–7.7)
Neutrophils Relative %: 42 %
Platelet Count: 234 10*3/uL (ref 150–400)
RBC: 3.59 MIL/uL — ABNORMAL LOW (ref 3.87–5.11)
RDW: 13.7 % (ref 11.5–15.5)
WBC Count: 6.6 10*3/uL (ref 4.0–10.5)
nRBC: 0 % (ref 0.0–0.2)

## 2020-09-20 LAB — CMP (CANCER CENTER ONLY)
ALT: 10 U/L (ref 0–44)
AST: 20 U/L (ref 15–41)
Albumin: 3.9 g/dL (ref 3.5–5.0)
Alkaline Phosphatase: 133 U/L — ABNORMAL HIGH (ref 38–126)
Anion gap: 8 (ref 5–15)
BUN: 7 mg/dL — ABNORMAL LOW (ref 8–23)
CO2: 33 mmol/L — ABNORMAL HIGH (ref 22–32)
Calcium: 9.4 mg/dL (ref 8.9–10.3)
Chloride: 99 mmol/L (ref 98–111)
Creatinine: 0.85 mg/dL (ref 0.44–1.00)
GFR, Estimated: >60 mL/min (ref >60)
Glucose, Bld: 122 mg/dL — ABNORMAL HIGH (ref 70–99)
Potassium: 3.7 mmol/L (ref 3.5–5.1)
Sodium: 140 mmol/L (ref 135–145)
Total Bilirubin: 0.9 mg/dL (ref 0.3–1.2)
Total Protein: 6.9 g/dL (ref 6.5–8.1)

## 2020-09-20 LAB — LACTATE DEHYDROGENASE: LDH: 141 U/L (ref 98–192)

## 2020-09-20 NOTE — Progress Notes (Signed)
[place] [phone]   [phone]  PROGRESS NOTE  Patient Care Team: Aletha Halim., PA-C as PCP - General (Family Medicine) Eppie Gibson, MD as Consulting Physician (Radiation Oncology) Malmfelt, Stephani Police, RN as Oncology Nurse Navigator Orson Slick, MD as Consulting Physician (Hematology and Oncology)  Hematological/Oncological History # Diffuse Large B Cell Lymphoma, Stage II 1) 09/26/2019: CT scan of neck performed due to persistent tonsillar lesion after outpatient antibiotic course. CT scan showed a heterogeneous right tonsillar mass up to 3.2 cm and a small but suspicious right level 2A lymph node  2) 09/30/2019: patient underwent biopsy with ENT which revealed a DLBCL with FISH for MYC, BCL2 and BCL6 pending 3) 10/08/2019: establish care with Dr. Lorenso Courier  4) 10/21/2019: PET scan showed hypermetabolic right tonsillar mass and adjacent right level 2 adenopathy, consistent with the given history of lymphoma. 5) 11/13/2019: Cycle 1 Day 1 of R-miniCHOP 6) 12/05/2019: Cycle 2 Day 1 of R-miniCHOP 7) 12/25/2019: Cycle 3 Day 1 of R-miniCHOP 8) 02/04/2020-02/27/2020: radiation therapy.   Interval History:  Amy [name] 85 y.o. female with medical history significant for Stage II DLBCL of the tonsil who presents for a follow up visit. The patient's last visit was on 06/16/2020. In the interim since the last visit she has had no major changes in her health.  On exam today Amy [name] reports she has been well overall interim since her last visit.  She did lose some weight due to the issue previously noted with her bowels, identified on CT scan as a possible intussusception.  She has been followed by Dr. Watt Climes to help address this.  She notes that she does still occasionally have some dull pain in her left lower abdomen and reports that she is trying to eat slower and smaller portions in order to try to help with this.  She notes that she is not having  any signs or symptoms concerning for recurrence at this time.  She denies any fevers, chills, sweats, nausea, ming or diarrhea.  She notes her energy is "better and getting better".  Her hemoglobin is mildly depressed at 11.8 today.  Otherwise she reports that she feels well..  A full 10 point ROS is listed below.  MEDICAL HISTORY:  Past Medical History:  Diagnosis Date   Anxiety    Arthritis    Atrial fibrillation (HCC)    COPD (chronic obstructive pulmonary disease) (HCC)    Esophageal stricture    s/p repeated dilations, Dr. Watt Climes   GERD (gastroesophageal reflux disease)    Hearing loss bilateral   has hearing aids   Heart murmur    Hiatal hernia    History of blood transfusion    many years ago    Hyperlipidemia    Hypothyroidism    Osteoarthritis    Osteoporosis    Pulmonary nodules    Renal lesion    1cm left kidney    SURGICAL HISTORY: Past Surgical History:  Procedure Laterality Date   ABDOMINAL HYSTERECTOMY     BREAST ENHANCEMENT SURGERY  2006   CATARACT EXTRACTION W/ INTRAOCULAR LENS  IMPLANT, BILATERAL  '09   ESOPHAGEAL DILATION     IR IMAGING GUIDED PORT INSERTION  11/05/2019   IR REMOVAL TUN ACCESS W/ PORT W/O FL MOD SED  05/03/2020   KNEE ARTHROSCOPY  02/22/2011   Procedure: ARTHROSCOPY KNEE;  Surgeon: Gearlean Alf, MD;  Location: [facility];  Service: Orthopedics;  Laterality: Right;  WITH DEBRIDEMENT    LAPAROSCOPIC SMALL BOWEL RESECTION N/A 06/02/2016   Procedure: DIAGNOSTIC LAPAROSCOPY SMALL BOWEL RESECTION;  Surgeon: Armandina Gemma, MD;  Location: [facility];  Service: General;  Laterality: N/A;    SOCIAL HISTORY: Social History   Socioeconomic History   Marital status: Widowed    Spouse name: Not on file   Number of children: 3   Years of education: 31   Highest education level: Not on file  Occupational History   Not on file  Tobacco Use   Smoking status: Former    Packs/day: 1.00    Years: 30.00    Pack years: 30.00    Types:  Cigarettes    Quit date: 01/09/1997    Years since quitting: 23.7   Smokeless tobacco: Never  Vaping Use   Vaping Use: Never used  Substance and Sexual Activity   Alcohol use: No   Drug use: No   Sexual activity: Not Currently  Other Topics Concern   Not on file  Social History Narrative   Right handed    Live alone   Caffeine use: 1 cup coffee every morning   Social Determinants of Health   Financial Resource Strain: Not on file  Food Insecurity: Not on file  Transportation Needs: Not on file  Physical Activity: Not on file  Stress: Not on file  Social Connections: Not on file  Intimate Partner Violence: Not on file    FAMILY HISTORY: Family History  Problem Relation Age of Onset   Heart disease Father    Rheum arthritis Father    Heart failure Mother    Diabetes Brother     ALLERGIES:  is allergic to other and codeine.  MEDICATIONS:  Current Outpatient Medications  Medication Sig Dispense Refill   ALPRAZolam (XANAX) 0.25 MG tablet Take 0.25 mg by mouth 2 (two) times daily.     amLODipine (NORVASC) 5 MG tablet Take 5 mg by mouth daily.     aspirin EC 81 MG tablet Take 81 mg by mouth at bedtime.     atorvastatin (LIPITOR) 20 MG tablet Take 20 mg by mouth at bedtime.  11   budesonide-formoterol (SYMBICORT) 160-4.5 MCG/ACT inhaler Inhale 2 puffs into the lungs 2 (two) times daily as needed (for shortness of breath).      Cholecalciferol (VITAMIN D3) 2000 UNITS TABS Take 2,000 Units by mouth at bedtime.      gabapentin (NEURONTIN) 100 MG capsule TAKE 1 CAP EVERY MORNING, 1 CAP IN THE AFTERNOON, AND 2 CAPS EVERY DAY AT BEDTIME (Patient taking differently: Take 100-200 mg by mouth See admin instructions. Take '100mg'$  by mouth every morning, take '100mg'$  by mouth in the afternoon, and take '200mg'$  by mouth at bedtime) 360 capsule 2   hydroxypropyl methylcellulose / hypromellose (ISOPTO TEARS / GONIOVISC) 2.5 % ophthalmic solution Place 1-2 drops into both eyes 3 (three) times daily  as needed (to soothe dry eyes.).      levothyroxine (SYNTHROID) 88 MCG tablet Take 88 mcg by mouth daily.     metoprolol tartrate (LOPRESSOR) 25 MG tablet Take 0.5 tablets (12.5 mg total) by mouth 2 (two) times daily. (Patient taking differently: Take 12.5 mg by mouth daily.) 60 tablet 1   pantoprazole (PROTONIX) 40 MG tablet Take 40 mg by mouth 2 (two) times daily.   11   Potassium 99 MG TABS Take 99 mg by mouth at bedtime.     traMADol (ULTRAM) 50 MG tablet Take 50 mg by mouth 2 (two) times  daily as needed for moderate pain.      vitamin B-12 (CYANOCOBALAMIN) 1000 MCG tablet Take 1,000 mcg by mouth daily.     No current facility-administered medications for this visit.    REVIEW OF SYSTEMS:   Constitutional: ( - ) fevers, ( - )  chills , ( - ) night sweats Eyes: ( - ) blurriness of vision, ( - ) double vision, ( - ) watery eyes Ears, nose, mouth, throat, and face: ( - ) mucositis, ( - ) sore throat Respiratory: ( - ) cough, ( - ) dyspnea, ( - ) wheezes Cardiovascular: ( - ) palpitation, ( - ) chest discomfort, ( - ) lower extremity swelling Gastrointestinal:  ( - ) nausea, ( - ) heartburn, ( - ) change in bowel habits Skin: ( - ) abnormal skin rashes Lymphatics: ( - ) new lymphadenopathy, ( - ) easy bruising Neurological: ( - ) numbness, ( - ) tingling, ( - ) new weaknesses Behavioral/Psych: ( - ) mood change, ( - ) new changes  All other systems were reviewed with the patient and are negative.  PHYSICAL EXAMINATION: ECOG PERFORMANCE STATUS: 2 - Symptomatic, <50% confined to bed  Vitals:   09/20/20 1452  BP: (!) 116/58  Pulse: 77  Resp: 17  Temp: 98.5 F (36.9 C)  SpO2: 100%   Filed Weights   09/20/20 1452  Weight: 121 lb 1.6 oz (54.9 kg)    GENERAL: well appearing elderly Caucasian female in NAD  SKIN: skin color, texture, turgor are normal, no rashes or significant lesions ENT: erythema at right back tonsil, but no overt enlargement or concern for recurrence.  EYES:  conjunctiva are pink and non-injected, sclera clear LUNGS: clear to auscultation and percussion with normal breathing effort HEART: regular rate & rhythm and no murmurs and no lower extremity edema Musculoskeletal: no cyanosis of digits and no clubbing  PSYCH: alert & oriented x 3, fluent speech NEURO: no focal motor/sensory deficits  LABORATORY DATA:  I have reviewed the data as listed CBC Latest Ref Rng & Units 09/20/2020 06/16/2020 03/11/2020  WBC 4.0 - 10.5 K/uL 6.6 6.4 6.2  Hemoglobin 12.0 - 15.0 g/dL 11.8(L) 13.5 11.6(L)  Hematocrit 36.0 - 46.0 % 35.6(L) 41.3 35.6(L)  Platelets 150 - 400 K/uL 234 251 246    CMP Latest Ref Rng & Units 09/20/2020 06/16/2020 03/11/2020  Glucose 70 - 99 mg/dL 122(H) 102(H) 106(H)  BUN 8 - 23 mg/dL 7(L) 8 8  Creatinine 0.44 - 1.00 mg/dL 0.85 0.82 0.69  Sodium 135 - 145 mmol/L 140 143 139  Potassium 3.5 - 5.1 mmol/L 3.7 3.3(L) 3.9  Chloride 98 - 111 mmol/L 99 97(L) 103  CO2 22 - 32 mmol/L 33(H) 35(H) 29  Calcium 8.9 - 10.3 mg/dL 9.4 9.7 8.9  Total Protein 6.5 - 8.1 g/dL 6.9 7.4 6.8  Total Bilirubin 0.3 - 1.2 mg/dL 0.9 0.8 0.6  Alkaline Phos 38 - 126 U/L 133(H) 135(H) 133(H)  AST 15 - 41 U/L '20 28 26  '$ ALT 0 - 44 U/L '10 14 16   '$ RADIOGRAPHIC STUDIES: No results found.  ASSESSMENT & PLAN Amy [name] 85 y.o. female with medical history significant for Stage II DLBCL of the tonsil who presents for a follow up visit.    After review the labs, the records, discussion with the patient the findings most consistent with a stage II diffuse large B-cell lymphoma of the tonsil.  She has involvement of the right tonsil as well as  axillary lymph nodes which would constitute stage II disease.  She does have some mild activity in other lymph nodes throughout the body, but nowhere near as active as the ones within her head neck.  Lymphomas involvement of these lymph nodes is not likely and therefore I would proceed with treatment as if the patient were a stage  II.  Previously we discussed the diagnosis of diffuse large B-cell lymphoma and the treatment options moving forward.  The regimen most recommended for this patient would be R mini CHOP with consideration for 3 cycles followed by radiation to the local lymph nodes.  This is consistent with the treatment course to be recommended with R-CHOP chemotherapy for stage II disease.  We are still currently waiting for the results of the double hit panel, however this would not likely change management as the patient would not be able to tolerate full strength chemotherapy with something like R-EPOCH.  We also discussed the risks and benefits of this R-CHOP chemotherapy including constipation, neurotoxicity, cardiac toxicity, drop in blood counts, fatigue, nausea, vomiting, and alopecia.  The patient voiced her understanding of these complications and wished to proceed forward with treatment.  The regimen of R- miniCHOP consists of rituximab 325 mg per metered squared, cyclophosphamide 400 mg per metered squared IV, doxorubicin 25 mg per metered squared IV, vincristine 1 mg IV, and prednisone 40 mg per metered squared p.o. on days 1 through 5.  This will be pursued for 3 cycles of chemotherapy with the addition of radiation therapy.  Based on NCCN recommendations, member institutes do practice this regimen (short course chemo + RT), though data is not robust.  # Diffuse Large B Cell Lymphoma, Stage II --complete response noted on PET CT scan on 01/08/2020 --completed 3 cycles of R-mini-CHOP  followed by localized radiation.  -- TTE shows EF 60-65% with mild diastolic dysfunction --patient has established care with Dr. Isidore Moos and underwent radiation therapy which ended on 02/27/2020.  --Hgb 11.8 today. --repeat CT scan can be performed every 6 months x 2 years, then only as clinically indicated. Next due in Dec 2022 --RTC in 3 months time for continued f/u.   #Supportive Care --chemotherapy education  completed --port removed on 05/03/2020 -- no pain medication required at this time.   Orders Placed This Encounter  Procedures   CT Soft Tissue Neck W Contrast    Standing Status:   Future    Standing Expiration Date:   09/20/2021    Order Specific Question:   If indicated for the ordered procedure, I authorize the administration of contrast media per Radiology protocol    Answer:   Yes    Order Specific Question:   Preferred imaging location?    Answer:   [facility]   CT CHEST ABDOMEN PELVIS W CONTRAST    Standing Status:   Future    Standing Expiration Date:   09/20/2021    Order Specific Question:   Preferred imaging location?    Answer:   [facility]    Order Specific Question:   Is Oral Contrast requested for this exam?    Answer:   Yes, Per Radiology protocol    Order Specific Question:   Reason for Exam (SYMPTOM  OR DIAGNOSIS REQUIRED)    Answer:   history of DLBCL, assess for recurrence    All questions were answered. The patient knows to call the clinic with any problems, questions or concerns.  A total of more than 30 minutes  were spent on this encounter and over half of that time was spent on counseling and coordination of care as outlined above.   Ledell Peoples, MD Department of Hematology/Oncology [facility] Phone: [phone] Pager: [phone] Email: [email or web address]  09/20/2020 5:34 PM

## 2020-11-01 ENCOUNTER — Ambulatory Visit: Payer: Medicare Other | Admitting: Cardiology

## 2020-11-22 ENCOUNTER — Other Ambulatory Visit: Payer: Self-pay

## 2020-11-22 ENCOUNTER — Ambulatory Visit (INDEPENDENT_AMBULATORY_CARE_PROVIDER_SITE_OTHER): Payer: Medicare Other

## 2020-11-22 ENCOUNTER — Encounter: Payer: Self-pay | Admitting: Cardiology

## 2020-11-22 ENCOUNTER — Ambulatory Visit (INDEPENDENT_AMBULATORY_CARE_PROVIDER_SITE_OTHER): Payer: Medicare Other | Admitting: Cardiology

## 2020-11-22 DIAGNOSIS — Z8679 Personal history of other diseases of the circulatory system: Secondary | ICD-10-CM | POA: Diagnosis not present

## 2020-11-22 DIAGNOSIS — E785 Hyperlipidemia, unspecified: Secondary | ICD-10-CM | POA: Diagnosis not present

## 2020-11-22 DIAGNOSIS — J449 Chronic obstructive pulmonary disease, unspecified: Secondary | ICD-10-CM | POA: Diagnosis not present

## 2020-11-22 DIAGNOSIS — R002 Palpitations: Secondary | ICD-10-CM

## 2020-11-22 DIAGNOSIS — R010 Benign and innocent cardiac murmurs: Secondary | ICD-10-CM

## 2020-11-22 DIAGNOSIS — I480 Paroxysmal atrial fibrillation: Secondary | ICD-10-CM

## 2020-11-22 DIAGNOSIS — I10 Essential (primary) hypertension: Secondary | ICD-10-CM

## 2020-11-22 NOTE — Progress Notes (Unsigned)
Patient enrolled for [employer] to mail #2 of two consecutive 14 day ZIO XT monitors to the patients address on file. Patient to start second monitor approximately 12/09/20.

## 2020-11-22 NOTE — Progress Notes (Signed)
Primary Care Provider: Aletha Halim., PA-C Cardiologist: None Previously seen by Dr. Wynonia Lawman Electrophysiologist: None  Clinic Note: Chief Complaint  Patient presents with   New Patient (Initial Visit)    History of A. fib, asymptomatic.  Not on DOAC.     ===================================  ASSESSMENT/PLAN   Problem List Items Addressed This Visit       Cardiology Problems   Essential hypertension (Chronic)    Blood pressure pretty stable on the low-dose Toprol and amlodipine.  Would not be more aggressive.      Relevant Medications   furosemide (LASIX) 20 MG tablet   pravastatin (PRAVACHOL) 80 MG tablet   metoprolol tartrate (LOPRESSOR) 25 MG tablet   Hyperlipidemia (Chronic)    She is taking high-dose pravastatin.  Tolerating relatively well, and her LDL is excellent.  As long she is tolerating, okay to continue pravastatin.  This is the statin that is least likely to potentially exacerbate memory issues.      Relevant Medications   furosemide (LASIX) 20 MG tablet   pravastatin (PRAVACHOL) 80 MG tablet   metoprolol tartrate (LOPRESSOR) 25 MG tablet     Other   History of atrial fibrillation without current medication (Chronic)    As far as I can tell she had 1 documented episode of A. fib during her hospitalization in the setting of acute illness.  She cannot recall any recurrent episodes of feeling her heart beating fast or irregular.  That does not mean that she is not had some constantly means that we have not documented any.  In the absence of active bouts of A. fib, would not do more than simply beta-blocker dosing as she is already taking, but the question comes as far as anticoagulation.  She has had multiple falls and now prefer if at all possible to avoid DOAC based upon her wishes.  Plan:  Continue metoprolol at current dose of (written as 25 mg twice daily) -> she currently is only taking 1 tablet a day, so I want to make sure she is taken it  correctly.  Should be On 12.5 mg twice daily or 25 mg Toprol daily.  We can confirm what she is taking at follow-up. On aspirin, this is provide some prevention.  There is concern that she could have a recurrent episode of A. fib, but with falls is an issue, I am reluctant to put her on DOAC for now. To assess A. fib burden => 2 back-to-back 14-day Zio patch monitors.      Functional heart murmur    Probably functional heart murmur mild aortic sclerosis just simply not noted on echocardiogram.  Certainly no significant valvular lesion.        COPD GOLD III (Chronic)    Explains her dyspnea.  Seems to be doing relatively well with low-dose beta 1 selective metoprolol.      Relevant Medications   albuterol (VENTOLIN HFA) 108 (90 Base) MCG/ACT inhaler   Palpitations (Chronic)    She has short little bursts of palpitations that does not sound like A. fib, however whether prior history, would like to see what exactly she is having.  Would prefer to avoid anything beyond her metoprolol dose.  Unless we have documented A. fib, would also hold off on DOAC.  Plan: Back-to-back 14-day Zio patch monitors to assess arrhythmia burden.      Relevant Orders   EKG 12-Lead (Completed)   LONG TERM MONITOR (3-14 DAYS)   LONG TERM MONITOR (3-14 DAYS)  Other Visit Diagnoses     Paroxysmal atrial fibrillation (HCC)   (Chronic)     Relevant Medications   furosemide (LASIX) 20 MG tablet   pravastatin (PRAVACHOL) 80 MG tablet   metoprolol tartrate (LOPRESSOR) 25 MG tablet       ===================================  HPI:    Amy [name] is a 85 y.o. female with a PMH notable for history of COPD (Gold 3), Diffuse Large B-Cell lymphoma (she recently completed treatments with radiation), hypothyroidism, hyperlipidemia and PAF (noted in 2015 in setting of cholecystitis)) who is being seen today for the evaluation/management of PALPITATIONS and HISTORY OF A. FIB at the request of Aletha Halim.,  PA-C.  Amy [name] was seen by Dr. Candee Furbish on April 25, 2013 for hospital follow-up.  She had been admitted for abdominal pain/cholecystitis and was found to have PAF.  CHA2DS2-VASc score was 3.  However, on review of her telemetry in the hospital it appeared that she did not have atrial fibrillation and therefore this was removed from her medical history.  She noted some exertional dyspnea walking down her driveway.  She indicated a desire to follow-up with Dr. Wynonia Lawman.  Was noted to have mild MR on echo.  She was seen in consultation while in the hospital in March 2018, and did apparently have a short burst of PAF.  This was in the setting of pneumonia.  TSH level was low.  She apparently developed a transient A. fib with RVR converting quickly back to sinus rhythm.  Did not recommend anticoagulation.  Recent Hospitalizations: None  Reviewed  CV studies:    The following studies were reviewed today: (if available, images/films reviewed: From Epic Chart or Care Everywhere) TTE 03/24/2016.(In setting of COPD exacerbation-pneumonia) EF 60 to 65%. ~Appear to be in A. Fib.  Unable to assess diastolic function.  Normal wall motion.  Mild LA dilation.  Trivial MR.  Elevated PAP estimated 53 mmHg, RAP 15 mm. TTE 07/29/2018: Vigorous LV function, EF> 65%.  Normal wall motion.  GR 1 DD with elevated LAP.  Normal RV size and function.  Normal valves.  Trivial MR. TTE 10/2019: EF 60 to 65%.  Normal LV size and function.  No R WMA.  GR 1 DD.  Normal PA/RVP with normal RV.  Normal RAP.  Grossly normal aortic and mitral valves.  No significant change from prior study.   Interval History:   Amy [name] presents here today along with her daughter who helps provide history.  She says that she will occasionally have some chest discomfort after eating or lying down, but not necessarily with exertion.  She has a lot of anxiety "worries a lot ".  She says she has lots of social stress issues as well as concerns  about her health etc.  Please episodes of chest discomfort are not necessarily associated with shortness of breath although she does note chronic exertional dyspnea simply just taking the trash out.  She does not have shortness of breath with routine exercise.  Dyspnea on exertion taking her trash, she denies any chest pain. She denies any PND orthopnea just a mild end of day swelling is well controlled her Lasix.  She also elevates her feet.  She was told she has a heart murmur and she says that maybe once or twice a week she Impella her heart Fast for few seconds and then go back to normal.  He has not noted any prolonged episodes of irregular heartbeats or palpitations.  CV Review of Symptoms (Summary) Cardiovascular ROS: positive for - dyspnea on exertion, edema, palpitations, and nonexertional chest pain, worse when lying down.  Not associate with dyspnea. negative for - loss of consciousness, orthopnea, paroxysmal nocturnal dyspnea, shortness of breath, or lightheadedness, dizziness or wooziness, syncope/near syncope or TIA/amaurosis fugax  REVIEWED OF SYSTEMS   Review of Systems  Constitutional:  Positive for malaise/fatigue. Negative for weight loss.  HENT:  Negative for congestion and sinus pain.   Respiratory:  Positive for cough and shortness of breath (With notable exertion).   Cardiovascular:  Positive for palpitations and leg swelling (Well well-controlled with foot elevation and low-dose Lasix.  Really only notes if she skips a day).  Gastrointestinal:  Negative for blood in stool and melena.  Genitourinary:  Negative for hematuria.  Musculoskeletal:  Positive for back pain, falls and joint pain (Chronic knee pain also hips-OA.).  Neurological:  Positive for dizziness (Positional/orthostatic). Negative for focal weakness and weakness.  Psychiatric/Behavioral:  Positive for memory loss. Negative for depression. The patient is nervous/anxious. The patient does not have insomnia.     I have reviewed and (if needed) personally updated the patient's problem list, medications, allergies, past medical and surgical history, social and family history.   PAST MEDICAL HISTORY   Past Medical History:  Diagnosis Date   Anxiety    Arthritis    COPD (chronic obstructive pulmonary disease) ([place])    Esophageal stricture    s/p repeated dilations, Dr. Watt Climes   GERD (gastroesophageal reflux disease)    Hearing loss bilateral   has hearing aids   Heart murmur    With no gross abnormalities on echocardiogram.   Hiatal hernia    History of blood transfusion    many years ago    Hyperlipidemia    Hypothyroidism    Osteoarthritis    Osteoporosis    Paroxysmal atrial fibrillation ([place])    1 confirm documented episode of A. fib; not on anticoagulation.   Pulmonary nodules    Renal lesion    1cm left kidney    PAST SURGICAL HISTORY   Past Surgical History:  Procedure Laterality Date   ABDOMINAL HYSTERECTOMY     BREAST ENHANCEMENT SURGERY  01/10/2004   CATARACT EXTRACTION W/ INTRAOCULAR LENS  IMPLANT, BILATERAL  '09   ESOPHAGEAL DILATION     IR IMAGING GUIDED PORT INSERTION  11/05/2019   IR REMOVAL TUN ACCESS W/ PORT W/O FL MOD SED  05/03/2020   KNEE ARTHROSCOPY  02/22/2011   Procedure: ARTHROSCOPY KNEE;  Surgeon: Gearlean Alf, MD;  [facility];  Service: Orthopedics;  Laterality: Right;  WITH DEBRIDEMENT    LAPAROSCOPIC SMALL BOWEL RESECTION N/A 06/02/2016   Procedure: DIAGNOSTIC LAPAROSCOPY SMALL BOWEL RESECTION;  Surgeon: Armandina Gemma, MD;  Location: [facility];  Service: General;  Laterality: N/A;   TRANSTHORACIC ECHOCARDIOGRAM  03/24/2016   a) (In setting of COPD exacerbation-pneumonia) EF 60 to 65%. ~Appear to be in A. Fib.  Unable to assess diastolic function.  Normal wall motion.  Mild LA dilation.  Trivial MR.  Elevated PAP estimated 53 mmHg, RAP 15 mm.; b) 07/29/2018: Vigorous LV function, EF> 65%.  Normal wall motion.  GR 1 DD with  elevated LAP.  Normal RV size and function.  Normal valves.  Trivial MR.   TRANSTHORACIC ECHOCARDIOGRAM  10/2019   EF 60 to 65%.  Normal LV size and function.  No R WMA.  GR 1 DD.  Normal PA/RVP with normal RV.  Normal  RAP.  Grossly normal aortic and mitral valves.  No significant change from prior study.    Immunization History  Administered Date(s) Administered   Influenza Split 09/29/2011, 11/10/2011, 10/23/2019   Influenza, High Dose Seasonal PF 11/03/2015, 10/03/2017, 09/03/2018   Influenza,inj,quad, With Preservative 09/30/2012   Moderna Sars-Covid-2 Vaccination 04/08/2019, 05/06/2019, 11/12/2019   Pneumococcal Conjugate-13 01/10/2008, 11/24/2013   Pneumococcal Polysaccharide-23 01/10/2008, 10/09/2009, 12/17/2018   Tdap 10/04/2016   Zoster Recombinat (Shingrix) 01/16/2019, 03/19/2019   Zoster, Live 02/10/2008    MEDICATIONS/ALLERGIES   Current Meds  Medication Sig   albuterol (VENTOLIN HFA) 108 (90 Base) MCG/ACT inhaler Inhale 2 puffs into the lungs every 4 (four) hours as needed.   ALPRAZolam (XANAX) 0.25 MG tablet Take 0.25 mg by mouth 2 (two) times daily.   amLODipine (NORVASC) 5 MG tablet Take 5 mg by mouth daily.   aspirin EC 81 MG tablet Take 81 mg by mouth at bedtime.   atorvastatin (LIPITOR) 20 MG tablet Take 20 mg by mouth at bedtime.   Cholecalciferol (VITAMIN D3) 2000 UNITS TABS Take 2,000 Units by mouth at bedtime.    furosemide (LASIX) 20 MG tablet Take by mouth.   gabapentin (NEURONTIN) 100 MG capsule TAKE 1 CAP EVERY MORNING, 1 CAP IN THE AFTERNOON, AND 2 CAPS EVERY DAY AT BEDTIME   levothyroxine (SYNTHROID) 88 MCG tablet Take 88 mcg by mouth daily.   metoprolol tartrate (LOPRESSOR) 25 MG tablet Take 25 mg by mouth 2 (two) times daily.   pantoprazole (PROTONIX) 40 MG tablet Take 40 mg by mouth 2 (two) times daily.    Potassium 99 MG TABS Take 99 mg by mouth at bedtime.   pravastatin (PRAVACHOL) 80 MG tablet 1 tablet   traMADol (ULTRAM) 50 MG tablet Take 50 mg  by mouth 2 (two) times daily as needed for moderate pain.    vitamin B-12 (CYANOCOBALAMIN) 1000 MCG tablet Take 1,000 mcg by mouth daily.    Allergies  Allergen Reactions   Other Anaphylaxis    Swelling in throat Bee Sting   Codeine Other (See Comments)    Reaction:  Hallucinations Other reaction(s): Confusion    SOCIAL HISTORY/FAMILY HISTORY   Reviewed in Epic:  Pertinent findings:  Social History   Tobacco Use   Smoking status: Former    Packs/day: 1.00    Years: 30.00    Pack years: 30.00    Types: Cigarettes    Quit date: 01/09/1997    Years since quitting: 23.8   Smokeless tobacco: Never  Vaping Use   Vaping Use: Never used  Substance Use Topics   Alcohol use: No   Drug use: No   Social History   Social History Narrative   Right handed    Live alone   Caffeine use: 1 cup coffee every morning   Widowed in 2013-husband died of pancreatic cancer.  OBJCTIVE -PE, EKG, labs   Wt Readings from Last 3 Encounters:  11/22/20 123 lb 6.4 oz (56 kg)  09/20/20 121 lb 1.6 oz (54.9 kg)  06/16/20 125 lb 9.6 oz (57 kg)    Physical Exam: BP 128/62 (BP Location: Right Arm, Patient Position: Sitting, Cuff Size: Normal)   Pulse 60   Ht 5\' 5"  (1.651 m)   Wt 123 lb 6.4 oz (56 kg)   SpO2 97%   BMI 20.53 kg/m  Physical Exam Vitals reviewed.  Constitutional:      General: She is not in acute distress.    Appearance: Normal appearance. She is not ill-appearing.  Comments: Somewhat frail elderly woman.  Somewhat frail elderly woman.  But relatively well-groomed and well-nourished.  HENT:     Head: Normocephalic and atraumatic.     Ears:     Comments: Hard of hearing bilaterally. Eyes:     Extraocular Movements: Extraocular movements intact.  Cardiovascular:     Rate and Rhythm: Normal rate and regular rhythm. Occasional Extrasystoles are present.    Chest Wall: PMI is not displaced.     Pulses: Intact distal pulses. Decreased pulses (Decreased, but palpable pedal  pulses.).     Heart sounds: S1 normal and S2 normal. Murmur (Soft 1/6 SEM at RUSB without radiation.) heard.    No friction rub. No gallop.  Pulmonary:     Effort: Pulmonary effort is normal. No respiratory distress.     Breath sounds: Wheezing present. No rhonchi or rales.  Chest:     Chest wall: Tenderness present.  Abdominal:     General: Abdomen is flat. Bowel sounds are normal. There is no distension.     Palpations: Abdomen is soft. There is no mass.  Musculoskeletal:        General: No swelling. Normal range of motion.  Skin:    General: Skin is warm and dry.  Neurological:     General: No focal deficit present.     Mental Status: She is alert and oriented to person, place, and time.     Gait: Gait abnormal.     Comments: Somewhat easily confused.  Poor historian  Psychiatric:        Thought Content: Thought content normal.        Judgment: Judgment normal.     Adult ECG Report  Rate: 60;  Rhythm: normal sinus rhythm and left atrial abnormality, otherwise normal axis, intervals durations. ;   Narrative Interpretation: Essentially normal.  Recent Labs:   [place] Related to [facility], 3rd Generation Component 10/20/20  08/23/20  05/26/20   TSH 2.38  0.22 Low   0.44 Low     Lipid Profile Component 08/23/20  02/24/20  06/17/19   LDL Direct 55 55 65  Total Cholesterol 109 120 127  Triglycerides 94 136 124  HDL Cholesterol 37 35 38  Total Chol / HDL Cholesterol 2. 1 9 3.4 3.3   Comprehensive Metabolic Panel Component 61/44/31  06/23/20  05/26/20  02/24/20   Sodium 139 136 139 140  Potassium 3.5 3.3 Low  4.1 3.6  Chloride 95 Low  89 Low  98 102  CO2 39 High  40 High  36 High  34 High   BUN 7 Low  12 7 Low  8  Glucose 96  107 High   117 High   87   Creatinine 0.73 0.89 0.83 0.76  Calcium 9.7 8.9 9.1 9.1  Total Protein 7.2  6.7  6.9  6.8   Albumin  4.2 4.1 4.1 4.2  Total Bilirubin 1.1  1.1  0.6  0.5   Alkaline Phosphatase 138 High  138 High   132 High  126 High   AST (SGOT) 22 23 25 26   ALT (SGPT) 15 13 14 15   Anion Gap 5 7 5 4   Est. GFR 82  64  70  78   CBC and Differential Component 08/23/20  08/06/20  06/23/20  05/26/20   WBC 5.8 -- 9.8 6.1  RBC 4.07 Low  -- 3.99 Low  4.09 Low   Hemoglobin 13.1 11.8 Abnormal  12.7 12.7  Hematocrit 40.5 --  39.5 40.6  MCV 99.5 High  -- 99.1 High  99.3 High   MCH 32.2 -- 31.9 31.2  MCHC 32.4 Low  -- 32.2 Low  31.4 Low   RDW 15.1 -- 16.6 16.4  Platelets 284 -- 221 240     Component 08/23/20  02/24/20  06/17/19   Vitamin B12 1,158 High   >1,526 High  1,126 High    Component 08/23/20  02/24/20  06/17/19   Total Vitamin D 25-Hydroxy 54  54  47    No results found for: CHOL, HDL, LDLCALC, LDLDIRECT, TRIG, CHOLHDL Lab Results  Component Value Date   CREATININE 0.85 09/20/2020   BUN 7 (L) 09/20/2020   NA 140 09/20/2020   K 3.7 09/20/2020   CL 99 09/20/2020   CO2 33 (H) 09/20/2020   CBC Latest Ref Rng & Units 09/20/2020 06/16/2020 03/11/2020  WBC 4.0 - 10.5 K/uL 6.6 6.4 6.2  Hemoglobin 12.0 - 15.0 g/dL 11.8(L) 13.5 11.6(L)  Hematocrit 36.0 - 46.0 % 35.6(L) 41.3 35.6(L)  Platelets 150 - 400 K/uL 234 251 246    Lab Results  Component Value Date   HGBA1C 5.9 (H) 07/28/2018   Lab Results  Component Value Date   TSH 1.058 07/28/2018    ==================================================  COVID-19 Education: The signs and symptoms of COVID-19 were discussed with the patient and how to seek care for testing (follow up with PCP or arrange E-visit).    I spent a total of 38 minutes with the patient spent in direct patient consultation.  Additional time spent with chart review  / charting (studies, outside notes, etc): 26 min Total Time: 64 min  Current medicines are reviewed at length with the patient today.  (+/- concerns) would prefer not to be on DOAC.  This visit occurred during the SARS-CoV-2 public health emergency.  Safety protocols were in place, including screening questions  prior to the visit, additional usage of staff PPE, and extensive cleaning of exam room while observing appropriate contact time as indicated for disinfecting solutions.  Notice: This dictation was prepared with Dragon dictation along with smart phrase technology. Any transcriptional errors that result from this process are unintentional and may not be corrected upon review.  Patient Instructions / Medication Changes & Studies & Tests Ordered   Patient Instructions  Medication Instructions:   No changes  *If you need a refill on your cardiac medications before your next appointment, please call your pharmacy*   Lab Work: notneeded   Testing/Procedures: Both will be mailed to you  in 5 to -10 days. You will wear a monitor for 2 weeks then place another monitor on an wear for another 2 weeks   Your physician has recommended that you wear a holter monitor 28 days total . Holter monitors are medical devices that record the heart's electrical activity. Doctors most often use these monitors to diagnose arrhythmias. Arrhythmias are problems with the speed or rhythm of the heartbeat. The monitor is a small, portable device. You can wear one while you do your normal daily activities. This is usually used to diagnose what is causing palpitations/syncope (passing out).    Follow-Up: At [facility], you and your health needs are our priority.  As part of our continuing mission to provide you with exceptional heart care, we have created designated Provider Care Teams.  These Care Teams include your primary Cardiologist (physician) and Advanced Practice Providers (APPs -  Physician Assistants and Nurse Practitioners) who all work together to provide you  with the care you need, when you need it.  We recommend signing up for the patient portal called "MyChart".  Sign up information is provided on this After Visit Summary.  MyChart is used to connect with patients for Virtual Visits (Telemedicine).   Patients are able to view lab/test results, encounter notes, upcoming appointments, etc.  Non-urgent messages can be sent to your provider as well.   To learn more about what you can do with MyChart, go to [email or web address].    Your next appointment:   2 month(s)  The format for your next appointment:   In Person  Provider:   Dr Glenetta Hew    Other Instructions [place] Monitor Instructions  Your physician has requested you wear a ZIO patch monitor for 14 days  x 2 complete the frst 2 weeks then wear another monitor for an additional 2 weeks .  This is a single patch monitor. [employer] supplies one patch monitor per enrollment. Additional stickers are not available. Please do not apply patch if you will be having a Nuclear Stress Test,  Echocardiogram, Cardiac CT, MRI, or Chest Xray during the period you would be wearing the  monitor. The patch cannot be worn during these tests. You cannot remove and re-apply the  ZIO XT patch monitor.  Your ZIO patch monitor will be mailed 3 day USPS to your address on file. It may take 3-5 days  to receive your monitor after you have been enrolled.  Once you have received your monitor, please review the enclosed instructions. Your monitor  has already been registered assigning a specific monitor serial # to you.  Billing and Patient Assistance Program Information  We have supplied [employer] with any of your insurance information on file for billing purposes. [employer] offers a sliding scale Patient Assistance Program for patients that do not have  insurance, or whose insurance does not completely cover the cost of the ZIO monitor.  You must apply for the Patient Assistance Program to qualify for this discounted rate.  To apply, please call [employer] at [phone], select option 4, select option 2, ask to apply for  Patient Assistance Program. Theodore Demark will ask your household income, and how many people  are in your household. They will  quote your out-of-pocket cost based on that information.  [employer] will also be able to set up a 87-month, interest-free payment plan if needed.  Applying the monitor   Shave hair from upper left chest.  Hold abrader disc by orange tab. Rub abrader in 40 strokes over the upper left chest as  indicated in your monitor instructions.  Clean area with 4 enclosed alcohol pads. Let dry.  Apply patch as indicated in monitor instructions. Patch will be placed under collarbone on left  side of chest with arrow pointing upward.  Rub patch adhesive wings for 2 minutes. Remove white label marked "1". Remove the white  label marked "2". Rub patch adhesive wings for 2 additional minutes.  While looking in a mirror, press and release button in center of patch. A small green light will  flash 3-4 times. This will be your only indicator that the monitor has been turned on.  Do not shower for the first 24 hours. You may shower after the first 24 hours.  Press the button if you feel a symptom. You will hear a small click. Record Date, Time and  Symptom in the Patient Logbook.  When you are ready to remove the patch, follow instructions  on the last 2 pages of Patient  Logbook. Stick patch monitor onto the last page of Patient Logbook.  Place Patient Logbook in the blue and white box. Use locking tab on box and tape box closed  securely. The blue and white box has prepaid postage on it. Please place it in the mailbox as  soon as possible. Your physician should have your test results approximately 7 days after the  monitor has been mailed back to [facility].  Call [place] at [phone] if you have questions regarding  your ZIO XT patch monitor. Call them immediately if you see an orange light blinking on your  monitor.  If your monitor falls off in less than 4 days, contact our Monitor department at [phone].  If your monitor becomes loose or falls off after 4 days call [employer]  at [phone] for  suggestions on securing your monitor     Studies Ordered:   Orders Placed This Encounter  Procedures   LONG TERM MONITOR (3-14 DAYS)   LONG TERM MONITOR (3-14 DAYS)   EKG 12-Lead      Glenetta Hew, M.D., M.S. Interventional Cardiologist   Pager # [phone] Phone # [phone] [address] [place], [place] [address]   Thank you for choosing [facility] at [facility]!!

## 2020-11-22 NOTE — Progress Notes (Unsigned)
Patient enrolled for [employer] to mail 2- 14 day ZIO XT monitors to be worn consecutively.  The first 14 day monitor to be started approximately 11/25/20.  The second 14 day monitor to be started approximately 12/09/20.

## 2020-11-22 NOTE — Patient Instructions (Signed)
Medication Instructions:   No changes  *If you need a refill on your cardiac medications before your next appointment, please call your pharmacy*   Lab Work: notneeded   Testing/Procedures: Both will be mailed to you  in 5 to -10 days. You will wear a monitor for 2 weeks then place another monitor on an wear for another 2 weeks   Your physician has recommended that you wear a holter monitor 28 days total . Holter monitors are medical devices that record the heart's electrical activity. Doctors most often use these monitors to diagnose arrhythmias. Arrhythmias are problems with the speed or rhythm of the heartbeat. The monitor is a small, portable device. You can wear one while you do your normal daily activities. This is usually used to diagnose what is causing palpitations/syncope (passing out).    Follow-Up: At [facility], you and your health needs are our priority.  As part of our continuing mission to provide you with exceptional heart care, we have created designated Provider Care Teams.  These Care Teams include your primary Cardiologist (physician) and Advanced Practice Providers (APPs -  Physician Assistants and Nurse Practitioners) who all work together to provide you with the care you need, when you need it.  We recommend signing up for the patient portal called "MyChart".  Sign up information is provided on this After Visit Summary.  MyChart is used to connect with patients for Virtual Visits (Telemedicine).  Patients are able to view lab/test results, encounter notes, upcoming appointments, etc.  Non-urgent messages can be sent to your provider as well.   To learn more about what you can do with MyChart, go to [email or web address].    Your next appointment:   2 month(s)  The format for your next appointment:   In Person  Provider:   Dr Glenetta Hew    Other Instructions [place] Monitor Instructions  Your physician has requested you wear a ZIO patch  monitor for 14 days  x 2 complete the frst 2 weeks then wear another monitor for an additional 2 weeks .  This is a single patch monitor. [employer] supplies one patch monitor per enrollment. Additional stickers are not available. Please do not apply patch if you will be having a Nuclear Stress Test,  Echocardiogram, Cardiac CT, MRI, or Chest Xray during the period you would be wearing the  monitor. The patch cannot be worn during these tests. You cannot remove and re-apply the  ZIO XT patch monitor.  Your ZIO patch monitor will be mailed 3 day USPS to your address on file. It may take 3-5 days  to receive your monitor after you have been enrolled.  Once you have received your monitor, please review the enclosed instructions. Your monitor  has already been registered assigning a specific monitor serial # to you.  Billing and Patient Assistance Program Information  We have supplied [employer] with any of your insurance information on file for billing purposes. [employer] offers a sliding scale Patient Assistance Program for patients that do not have  insurance, or whose insurance does not completely cover the cost of the ZIO monitor.  You must apply for the Patient Assistance Program to qualify for this discounted rate.  To apply, please call [employer] at [phone], select option 4, select option 2, ask to apply for  Patient Assistance Program. Theodore Demark will ask your household income, and how many people  are in your household. They will quote your out-of-pocket cost based on that information.  [employer] will also be able to set up a 80-month, interest-free payment plan if needed.  Applying the monitor   Shave hair from upper left chest.  Hold abrader disc by orange tab. Rub abrader in 40 strokes over the upper left chest as  indicated in your monitor instructions.  Clean area with 4 enclosed alcohol pads. Let dry.  Apply patch as indicated in monitor instructions. Patch will be placed under  collarbone on left  side of chest with arrow pointing upward.  Rub patch adhesive wings for 2 minutes. Remove white label marked "1". Remove the white  label marked "2". Rub patch adhesive wings for 2 additional minutes.  While looking in a mirror, press and release button in center of patch. A small green light will  flash 3-4 times. This will be your only indicator that the monitor has been turned on.  Do not shower for the first 24 hours. You may shower after the first 24 hours.  Press the button if you feel a symptom. You will hear a small click. Record Date, Time and  Symptom in the Patient Logbook.  When you are ready to remove the patch, follow instructions on the last 2 pages of Patient  Logbook. Stick patch monitor onto the last page of Patient Logbook.  Place Patient Logbook in the blue and white box. Use locking tab on box and tape box closed  securely. The blue and white box has prepaid postage on it. Please place it in the mailbox as  soon as possible. Your physician should have your test results approximately 7 days after the  monitor has been mailed back to [facility].  Call [place] at [phone] if you have questions regarding  your ZIO XT patch monitor. Call them immediately if you see an orange light blinking on your  monitor.  If your monitor falls off in less than 4 days, contact our Monitor department at [phone].  If your monitor becomes loose or falls off after 4 days call [employer] at [phone] for  suggestions on securing your monitor

## 2020-11-25 ENCOUNTER — Encounter: Payer: Self-pay | Admitting: Cardiology

## 2020-11-25 DIAGNOSIS — R002 Palpitations: Secondary | ICD-10-CM | POA: Diagnosis not present

## 2020-11-26 ENCOUNTER — Encounter: Payer: Self-pay | Admitting: Cardiology

## 2020-11-26 DIAGNOSIS — I10 Essential (primary) hypertension: Secondary | ICD-10-CM | POA: Insufficient documentation

## 2020-11-26 DIAGNOSIS — R010 Benign and innocent cardiac murmurs: Secondary | ICD-10-CM | POA: Insufficient documentation

## 2020-11-26 NOTE — Assessment & Plan Note (Signed)
She is taking high-dose pravastatin.  Tolerating relatively well, and her LDL is excellent.  As long she is tolerating, okay to continue pravastatin.  This is the statin that is least likely to potentially exacerbate memory issues.

## 2020-11-26 NOTE — Assessment & Plan Note (Signed)
Explains her dyspnea.  Seems to be doing relatively well with low-dose beta 1 selective metoprolol.

## 2020-11-26 NOTE — Assessment & Plan Note (Signed)
She has short little bursts of palpitations that does not sound like A. fib, however whether prior history, would like to see what exactly she is having.  Would prefer to avoid anything beyond her metoprolol dose.  Unless we have documented A. fib, would also hold off on DOAC.  Plan: Back-to-back 14-day Zio patch monitors to assess arrhythmia burden.

## 2020-11-26 NOTE — Assessment & Plan Note (Signed)
As far as I can tell she had 1 documented episode of A. fib during her hospitalization in the setting of acute illness.  She cannot recall any recurrent episodes of feeling her heart beating fast or irregular.  That does not mean that she is not had some constantly means that we have not documented any.  In the absence of active bouts of A. fib, would not do more than simply beta-blocker dosing as she is already taking, but the question comes as far as anticoagulation.  She has had multiple falls and now prefer if at all possible to avoid DOAC based upon her wishes.  Plan:   Continue metoprolol at current dose of (written as 25 mg twice daily) -> she currently is only taking 1 tablet a day, so I want to make sure she is taken it correctly.  Should be On 12.5 mg twice daily or 25 mg Toprol daily.  We can confirm what she is taking at follow-up.  On aspirin, this is provide some prevention.  There is concern that she could have a recurrent episode of A. fib, but with falls is an issue, I am reluctant to put her on DOAC for now.  To assess A. fib burden => 2 back-to-back 14-day Zio patch monitors.

## 2020-11-26 NOTE — Assessment & Plan Note (Signed)
Blood pressure pretty stable on the low-dose Toprol and amlodipine.  Would not be more aggressive.

## 2020-11-26 NOTE — Assessment & Plan Note (Signed)
Probably functional heart murmur mild aortic sclerosis just simply not noted on echocardiogram.  Certainly no significant valvular lesion.

## 2020-11-27 DIAGNOSIS — R002 Palpitations: Secondary | ICD-10-CM

## 2020-11-29 ENCOUNTER — Telehealth: Payer: Self-pay | Admitting: Cardiology

## 2020-11-29 NOTE — Telephone Encounter (Signed)
[employer] called an said that the patient zio monitor had a yellow light on it and they restarted it so they did not charge the patient due to the defect

## 2020-12-01 ENCOUNTER — Other Ambulatory Visit: Payer: Self-pay | Admitting: *Deleted

## 2020-12-01 DIAGNOSIS — C8331 Diffuse large B-cell lymphoma, lymph nodes of head, face, and neck: Secondary | ICD-10-CM

## 2020-12-09 HISTORY — PX: OTHER SURGICAL HISTORY: SHX169

## 2020-12-10 NOTE — Telephone Encounter (Signed)
Patient was sent a new monitor to apply and wear.

## 2020-12-13 ENCOUNTER — Ambulatory Visit (HOSPITAL_COMMUNITY): Payer: Medicare Other

## 2020-12-14 ENCOUNTER — Telehealth: Payer: Self-pay | Admitting: Cardiology

## 2020-12-14 NOTE — Telephone Encounter (Signed)
Called pt, she states she received the monitor, applied it and it was blinking orange. She was told to send the monitor back and use the second one. She states she has been wearing the current monitor since Friday. Pt will wear this monitor until 12/16.  She dropped the initial monitor in the box on Sunday, she states her friend helped her remove it and seal it up. Pt is adamant  the monitor was in the box. Pt will call the post office to see if they have the monitor there. Will relay message to Dr. Ellyn Hack and his nurse about the matter.

## 2020-12-14 NOTE — Telephone Encounter (Signed)
Nereida from [facility] calling, because the patient wore a zio patch and they  received the box without the monitor inside it. She states she is informing the office to contact the patient or order another monitor. She states she does not need a call back, but if the office does call the number is Phone: [place] number: [phone]

## 2020-12-15 NOTE — Telephone Encounter (Signed)
I honestly am not sure what were supposed to do about this.  If she put it in the mail, she put it in the mail.  I have no reason to suspect that she would have any reason to keep a Monitor.  Glenetta Hew, MD

## 2020-12-16 ENCOUNTER — Other Ambulatory Visit: Payer: Self-pay | Admitting: Hematology and Oncology

## 2020-12-16 NOTE — Telephone Encounter (Signed)
Called pt and she states "the way the lady talked it was intact when they had it." Pt states she will wear the current monitor until 12/16 and will add some clear tape to the box when shipping it back.

## 2020-12-23 ENCOUNTER — Inpatient Hospital Stay: Payer: Medicare Other

## 2020-12-23 ENCOUNTER — Inpatient Hospital Stay: Payer: Medicare Other | Admitting: Hematology and Oncology

## 2020-12-24 ENCOUNTER — Other Ambulatory Visit: Payer: Self-pay

## 2020-12-24 ENCOUNTER — Inpatient Hospital Stay: Payer: Medicare Other | Attending: Hematology and Oncology

## 2020-12-24 DIAGNOSIS — Z90710 Acquired absence of both cervix and uterus: Secondary | ICD-10-CM | POA: Diagnosis not present

## 2020-12-24 DIAGNOSIS — Z87891 Personal history of nicotine dependence: Secondary | ICD-10-CM | POA: Insufficient documentation

## 2020-12-24 DIAGNOSIS — Z79899 Other long term (current) drug therapy: Secondary | ICD-10-CM | POA: Diagnosis not present

## 2020-12-24 DIAGNOSIS — I480 Paroxysmal atrial fibrillation: Secondary | ICD-10-CM | POA: Diagnosis not present

## 2020-12-24 DIAGNOSIS — Z9221 Personal history of antineoplastic chemotherapy: Secondary | ICD-10-CM | POA: Insufficient documentation

## 2020-12-24 DIAGNOSIS — Z7982 Long term (current) use of aspirin: Secondary | ICD-10-CM | POA: Insufficient documentation

## 2020-12-24 DIAGNOSIS — M7989 Other specified soft tissue disorders: Secondary | ICD-10-CM | POA: Insufficient documentation

## 2020-12-24 DIAGNOSIS — Z923 Personal history of irradiation: Secondary | ICD-10-CM | POA: Diagnosis not present

## 2020-12-24 DIAGNOSIS — C8331 Diffuse large B-cell lymphoma, lymph nodes of head, face, and neck: Secondary | ICD-10-CM

## 2020-12-24 DIAGNOSIS — Z8572 Personal history of non-Hodgkin lymphomas: Secondary | ICD-10-CM | POA: Diagnosis not present

## 2020-12-24 DIAGNOSIS — E039 Hypothyroidism, unspecified: Secondary | ICD-10-CM | POA: Diagnosis not present

## 2020-12-24 LAB — CBC WITH DIFFERENTIAL (CANCER CENTER ONLY)
Abs Immature Granulocytes: 0.01 10*3/uL (ref 0.00–0.07)
Basophils Absolute: 0 10*3/uL (ref 0.0–0.1)
Basophils Relative: 1 %
Eosinophils Absolute: 0.2 10*3/uL (ref 0.0–0.5)
Eosinophils Relative: 4 %
HCT: 38 % (ref 36.0–46.0)
Hemoglobin: 12.4 g/dL (ref 12.0–15.0)
Immature Granulocytes: 0 %
Lymphocytes Relative: 58 %
Lymphs Abs: 3.3 10*3/uL (ref 0.7–4.0)
MCH: 32.2 pg (ref 26.0–34.0)
MCHC: 32.6 g/dL (ref 30.0–36.0)
MCV: 98.7 fL (ref 80.0–100.0)
Monocytes Absolute: 0.5 10*3/uL (ref 0.1–1.0)
Monocytes Relative: 9 %
Neutro Abs: 1.6 10*3/uL — ABNORMAL LOW (ref 1.7–7.7)
Neutrophils Relative %: 28 %
Platelet Count: 248 10*3/uL (ref 150–400)
RBC: 3.85 MIL/uL — ABNORMAL LOW (ref 3.87–5.11)
RDW: 13.3 % (ref 11.5–15.5)
WBC Count: 5.6 10*3/uL (ref 4.0–10.5)
nRBC: 0 % (ref 0.0–0.2)

## 2020-12-24 LAB — CMP (CANCER CENTER ONLY)
ALT: 14 U/L (ref 0–44)
AST: 24 U/L (ref 15–41)
Albumin: 4 g/dL (ref 3.5–5.0)
Alkaline Phosphatase: 116 U/L (ref 38–126)
Anion gap: 8 (ref 5–15)
BUN: 5 mg/dL — ABNORMAL LOW (ref 8–23)
CO2: 34 mmol/L — ABNORMAL HIGH (ref 22–32)
Calcium: 9.2 mg/dL (ref 8.9–10.3)
Chloride: 97 mmol/L — ABNORMAL LOW (ref 98–111)
Creatinine: 0.8 mg/dL (ref 0.44–1.00)
GFR, Estimated: >60 mL/min (ref >60)
Glucose, Bld: 122 mg/dL — ABNORMAL HIGH (ref 70–99)
Potassium: 3.1 mmol/L — ABNORMAL LOW (ref 3.5–5.1)
Sodium: 139 mmol/L (ref 135–145)
Total Bilirubin: 0.9 mg/dL (ref 0.3–1.2)
Total Protein: 7.1 g/dL (ref 6.5–8.1)

## 2020-12-27 ENCOUNTER — Other Ambulatory Visit (HOSPITAL_COMMUNITY): Payer: Medicare Other

## 2020-12-27 ENCOUNTER — Encounter (HOSPITAL_COMMUNITY): Payer: Self-pay

## 2020-12-27 ENCOUNTER — Other Ambulatory Visit: Payer: Self-pay

## 2020-12-27 ENCOUNTER — Inpatient Hospital Stay: Payer: Medicare Other

## 2020-12-27 ENCOUNTER — Ambulatory Visit (HOSPITAL_COMMUNITY)
Admission: RE | Admit: 2020-12-27 | Discharge: 2020-12-27 | Disposition: A | Discharge status: Home / Self Care | Payer: Medicare Other | Source: Ambulatory Visit | Attending: Hematology and Oncology | Admitting: Hematology and Oncology

## 2020-12-27 DIAGNOSIS — C8331 Diffuse large B-cell lymphoma, lymph nodes of head, face, and neck: Secondary | ICD-10-CM | POA: Diagnosis present

## 2020-12-27 DIAGNOSIS — I700 Atherosclerosis of aorta: Secondary | ICD-10-CM | POA: Diagnosis not present

## 2020-12-27 DIAGNOSIS — K5730 Diverticulosis of large intestine without perforation or abscess without bleeding: Secondary | ICD-10-CM | POA: Diagnosis not present

## 2020-12-27 MED ORDER — SODIUM CHLORIDE (PF) 0.9 % IJ SOLN
INTRAMUSCULAR | Status: AC
  Filled 2020-12-27: qty 50

## 2020-12-27 MED ORDER — IOHEXOL 9 MG/ML PO SOLN
ORAL | Status: AC
  Administered 2020-12-27: 09:00:00 1000 mL
  Filled 2020-12-27: qty 1000

## 2020-12-27 MED ORDER — IOHEXOL 9 MG/ML PO SOLN: 500.0000 mL | ORAL | Status: AC

## 2020-12-27 MED ORDER — IOHEXOL 350 MG/ML SOLN
80.0000 mL | Freq: Once | INTRAVENOUS | Status: AC | PRN
  Administered 2020-12-27: 11:00:00 80 mL via INTRAVENOUS

## 2020-12-29 ENCOUNTER — Other Ambulatory Visit: Payer: Self-pay | Admitting: Hematology and Oncology

## 2020-12-29 ENCOUNTER — Other Ambulatory Visit: Payer: Medicare Other

## 2020-12-29 ENCOUNTER — Inpatient Hospital Stay (HOSPITAL_BASED_OUTPATIENT_CLINIC_OR_DEPARTMENT_OTHER): Payer: Medicare Other | Admitting: Hematology and Oncology

## 2020-12-29 ENCOUNTER — Other Ambulatory Visit: Payer: Self-pay

## 2020-12-29 VITALS — BP 123/59 | HR 62 | Temp 97.5°F | Resp 17 | Wt 124.5 lb

## 2020-12-29 DIAGNOSIS — Z8572 Personal history of non-Hodgkin lymphomas: Secondary | ICD-10-CM | POA: Diagnosis not present

## 2020-12-29 DIAGNOSIS — Z95828 Presence of other vascular implants and grafts: Secondary | ICD-10-CM

## 2020-12-29 DIAGNOSIS — C8331 Diffuse large B-cell lymphoma, lymph nodes of head, face, and neck: Secondary | ICD-10-CM

## 2020-12-29 NOTE — Progress Notes (Signed)
[place] [phone]   [phone]  PROGRESS NOTE  Patient Care Team: Aletha Halim., PA-C as PCP - General (Family Medicine) Eppie Gibson, MD as Consulting Physician (Radiation Oncology) Malmfelt, Stephani Police, RN as Oncology Nurse Navigator Orson Slick, MD as Consulting Physician (Hematology and Oncology)  Hematological/Oncological History # Diffuse Large B Cell Lymphoma, Stage II 1) 09/26/2019: CT scan of neck performed due to persistent tonsillar lesion after outpatient antibiotic course. CT scan showed a heterogeneous right tonsillar mass up to 3.2 cm and a small but suspicious right level 2A lymph node  2) 09/30/2019: patient underwent biopsy with ENT which revealed a DLBCL with FISH for MYC, BCL2 and BCL6 pending 3) 10/08/2019: establish care with Dr. Lorenso Courier  4) 10/21/2019: PET scan showed hypermetabolic right tonsillar mass and adjacent right level 2 adenopathy, consistent with the given history of lymphoma. 5) 11/13/2019: Cycle 1 Day 1 of R-miniCHOP 6) 12/05/2019: Cycle 2 Day 1 of R-miniCHOP 7) 12/25/2019: Cycle 3 Day 1 of R-miniCHOP 8) 02/04/2020-02/27/2020: radiation therapy.  9) 12/27/2020: CT Chest/Ab/Pelvis/Neck showed no evidence of disease recurrence/progression.   Interval History:  Amy [name] 85 y.o. female with medical history significant for Stage II DLBCL of the tonsil who presents for a follow up visit. The patient's last visit was on 09/20/2020. In the interim since the last visit she has had no major changes in her health.  On exam today Amy [name] reports she has been quite well in the interim since her last visit.  She notes that she has had a fluttering in her heart a few times but thinks it may just be due to her being stressed out.  She has some occasional swelling in her feet and her primary care provider has put her back on allopurinol concerned that this may represent gout.  She reports that she has been eating  better and denies any lumps or bumps in her neck or elsewhere. She denies any fevers, chills, sweats, nausea, ming or diarrhea.  She notes her energy is "improving".  Her hemoglobin has improved to 12/4 today.  Otherwise she reports that she feels well.  A full 10 point ROS is listed below.  MEDICAL HISTORY:  Past Medical History:  Diagnosis Date   Anxiety    Arthritis    COPD (chronic obstructive pulmonary disease) ([place])    Esophageal stricture    s/p repeated dilations, Dr. Watt Climes   GERD (gastroesophageal reflux disease)    Hearing loss bilateral   has hearing aids   Heart murmur    With no gross abnormalities on echocardiogram.   Hiatal hernia    History of blood transfusion    many years ago    Hyperlipidemia    Hypothyroidism    large b cell lymphoma 04/2019   Osteoarthritis    Osteoporosis    Paroxysmal atrial fibrillation ([place])    1 confirm documented episode of A. fib; not on anticoagulation.   Pulmonary nodules    Renal lesion    1cm left kidney    SURGICAL HISTORY: Past Surgical History:  Procedure Laterality Date   ABDOMINAL HYSTERECTOMY     BREAST ENHANCEMENT SURGERY  01/10/2004   CATARACT EXTRACTION W/ INTRAOCULAR LENS  IMPLANT, BILATERAL  '09   ESOPHAGEAL DILATION     IR IMAGING GUIDED PORT INSERTION  11/05/2019   IR REMOVAL TUN ACCESS W/ PORT W/O FL MOD SED  05/03/2020   KNEE ARTHROSCOPY  02/22/2011   Procedure: ARTHROSCOPY KNEE;  Surgeon: Gearlean Alf, MD;  Location: [facility];  Service: Orthopedics;  Laterality: Right;  WITH DEBRIDEMENT    LAPAROSCOPIC SMALL BOWEL RESECTION N/A 06/02/2016   Procedure: DIAGNOSTIC LAPAROSCOPY SMALL BOWEL RESECTION;  Surgeon: Armandina Gemma, MD;  Location: [facility];  Service: General;  Laterality: N/A;   TRANSTHORACIC ECHOCARDIOGRAM  03/24/2016   a) (In setting of COPD exacerbation-pneumonia) EF 60 to 65%. ~Appear to be in A. Fib.  Unable to assess diastolic function.  Normal wall motion.  Mild LA dilation.   Trivial MR.  Elevated PAP estimated 53 mmHg, RAP 15 mm.; b) 07/29/2018: Vigorous LV function, EF> 65%.  Normal wall motion.  GR 1 DD with elevated LAP.  Normal RV size and function.  Normal valves.  Trivial MR.   TRANSTHORACIC ECHOCARDIOGRAM  10/2019   EF 60 to 65%.  Normal LV size and function.  No R WMA.  GR 1 DD.  Normal PA/RVP with normal RV.  Normal RAP.  Grossly normal aortic and mitral valves.  No significant change from prior study.    SOCIAL HISTORY: Social History   Socioeconomic History   Marital status: Widowed    Spouse name: Not on file   Number of children: 3   Years of education: 4   Highest education level: Not on file  Occupational History   Not on file  Tobacco Use   Smoking status: Former    Packs/day: 1.00    Years: 30.00    Pack years: 30.00    Types: Cigarettes    Quit date: 01/09/1997    Years since quitting: 23.9   Smokeless tobacco: Never  Vaping Use   Vaping Use: Never used  Substance and Sexual Activity   Alcohol use: No   Drug use: No   Sexual activity: Not Currently  Other Topics Concern   Not on file  Social History Narrative   Right handed    Live alone   Caffeine use: 1 cup coffee every morning   Social Determinants of Health   Financial Resource Strain: Not on file  Food Insecurity: Not on file  Transportation Needs: Not on file  Physical Activity: Not on file  Stress: Not on file  Social Connections: Not on file  Intimate Partner Violence: Not on file    FAMILY HISTORY: Family History  Problem Relation Age of Onset   Heart disease Father    Rheum arthritis Father    Heart failure Mother    Diabetes Brother     ALLERGIES:  is allergic to other and codeine.  MEDICATIONS:  Current Outpatient Medications  Medication Sig Dispense Refill   albuterol (VENTOLIN HFA) 108 (90 Base) MCG/ACT inhaler Inhale 2 puffs into the lungs every 4 (four) hours as needed.     ALPRAZolam (XANAX) 0.25 MG tablet Take 0.25 mg by mouth 2 (two) times  daily.     amLODipine (NORVASC) 5 MG tablet Take 5 mg by mouth daily.     aspirin EC 81 MG tablet Take 81 mg by mouth at bedtime.     atorvastatin (LIPITOR) 20 MG tablet Take 20 mg by mouth at bedtime.  11   Cholecalciferol (VITAMIN D3) 2000 UNITS TABS Take 2,000 Units by mouth at bedtime.      furosemide (LASIX) 20 MG tablet Take by mouth.     gabapentin (NEURONTIN) 100 MG capsule TAKE 1 CAP EVERY MORNING, 1 CAP IN THE AFTERNOON, AND 2 CAPS EVERY DAY AT BEDTIME 360 capsule 2   levothyroxine (  SYNTHROID) 88 MCG tablet Take 88 mcg by mouth daily.     metoprolol tartrate (LOPRESSOR) 25 MG tablet Take 25 mg by mouth 2 (two) times daily.     pantoprazole (PROTONIX) 40 MG tablet Take 40 mg by mouth 2 (two) times daily.   11   Potassium 99 MG TABS Take 99 mg by mouth at bedtime.     pravastatin (PRAVACHOL) 80 MG tablet 1 tablet     traMADol (ULTRAM) 50 MG tablet Take 50 mg by mouth 2 (two) times daily as needed for moderate pain.      vitamin B-12 (CYANOCOBALAMIN) 1000 MCG tablet Take 1,000 mcg by mouth daily.     No current facility-administered medications for this visit.    REVIEW OF SYSTEMS:   Constitutional: ( - ) fevers, ( - )  chills , ( - ) night sweats Eyes: ( - ) blurriness of vision, ( - ) double vision, ( - ) watery eyes Ears, nose, mouth, throat, and face: ( - ) mucositis, ( - ) sore throat Respiratory: ( - ) cough, ( - ) dyspnea, ( - ) wheezes Cardiovascular: ( - ) palpitation, ( - ) chest discomfort, ( - ) lower extremity swelling Gastrointestinal:  ( - ) nausea, ( - ) heartburn, ( - ) change in bowel habits Skin: ( - ) abnormal skin rashes Lymphatics: ( - ) new lymphadenopathy, ( - ) easy bruising Neurological: ( - ) numbness, ( - ) tingling, ( - ) new weaknesses Behavioral/Psych: ( - ) mood change, ( - ) new changes  All other systems were reviewed with the patient and are negative.  PHYSICAL EXAMINATION: ECOG PERFORMANCE STATUS: 2 - Symptomatic, <50% confined to  bed  Vitals:   12/29/20 1021  BP: (!) 123/59  Pulse: 62  Resp: 17  Temp: (!) 97.5 F (36.4 C)  SpO2: 91%   Filed Weights   12/29/20 1021  Weight: 124 lb 8 oz (56.5 kg)    GENERAL: well appearing elderly Caucasian female in NAD  SKIN: skin color, texture, turgor are normal, no rashes or significant lesions ENT: erythema at right back tonsil, but no overt enlargement or concern for recurrence.  EYES: conjunctiva are pink and non-injected, sclera clear LUNGS: clear to auscultation and percussion with normal breathing effort HEART: regular rate & rhythm and no murmurs and no lower extremity edema Musculoskeletal: no cyanosis of digits and no clubbing  PSYCH: alert & oriented x 3, fluent speech NEURO: no focal motor/sensory deficits  LABORATORY DATA:  I have reviewed the data as listed CBC Latest Ref Rng & Units 12/24/2020 09/20/2020 06/16/2020  WBC 4.0 - 10.5 K/uL 5.6 6.6 6.4  Hemoglobin 12.0 - 15.0 g/dL 12.4 11.8(L) 13.5  Hematocrit 36.0 - 46.0 % 38.0 35.6(L) 41.3  Platelets 150 - 400 K/uL 248 234 251    CMP Latest Ref Rng & Units 12/24/2020 09/20/2020 06/16/2020  Glucose 70 - 99 mg/dL 122(H) 122(H) 102(H)  BUN 8 - 23 mg/dL 5(L) 7(L) 8  Creatinine 0.44 - 1.00 mg/dL 0.80 0.85 0.82  Sodium 135 - 145 mmol/L 139 140 143  Potassium 3.5 - 5.1 mmol/L 3.1(L) 3.7 3.3(L)  Chloride 98 - 111 mmol/L 97(L) 99 97(L)  CO2 22 - 32 mmol/L 34(H) 33(H) 35(H)  Calcium 8.9 - 10.3 mg/dL 9.2 9.4 9.7  Total Protein 6.5 - 8.1 g/dL 7.1 6.9 7.4  Total Bilirubin 0.3 - 1.2 mg/dL 0.9 0.9 0.8  Alkaline Phos 38 - 126 U/L 116 133(H) 135(H)  AST 15 - 41 U/L 24 20 28   ALT 0 - 44 U/L 14 10 14    RADIOGRAPHIC STUDIES: CT Soft Tissue Neck W Contrast  Result Date: 12/27/2020 CLINICAL DATA:  Large B-cell lymphoma.  Follow-up. EXAM: CT NECK WITH CONTRAST TECHNIQUE: Multidetector CT imaging of the neck was performed using the standard protocol following the bolus administration of intravenous contrast. CONTRAST:   37mL OMNIPAQUE IOHEXOL 350 MG/ML SOLN COMPARISON:  09/26/2019 FINDINGS: Pharynx and larynx: Previously seen tonsillar mass on the right has resolved. No evidence of mucosal or submucosal mass lesion presently. Salivary glands: Parotid glands show atrophic change, right more than left. Pronounced atrophy of the submandibular glands. Thyroid: No identifiable thyroid tissue. Lymph nodes: No lymphadenopathy on either side of the neck. Previously enlarged right level 2 node has resolved. Vascular: Ordinary mild atherosclerotic change at the carotid bifurcations. Limited intracranial: Normal Visualized orbits: Normal Mastoids and visualized paranasal sinuses: Clear Skeleton: Ordinary cervical spondylosis. Upper chest: Pleural and parenchymal scarring at both lung apices. Other: None IMPRESSION: Resolution of the right tonsillar mass and right level 2 lymphadenopathy. No disease seen presently. Chronic atrophic changes of the parotid glands, and submandibular glands. No visible thyroid tissue. Electronically Signed   By: Nelson Chimes M.D.   On: 12/27/2020 16:22   CT CHEST ABDOMEN PELVIS W CONTRAST  Result Date: 12/27/2020 CLINICAL DATA:  History of diffuse large B-cell lymphoma, assess for recurrence. EXAM: CT CHEST, ABDOMEN, AND PELVIS WITH CONTRAST TECHNIQUE: Multidetector CT imaging of the chest, abdomen and pelvis was performed following the standard protocol during bolus administration of intravenous contrast. CONTRAST:  51mL OMNIPAQUE IOHEXOL 350 MG/ML SOLN COMPARISON:  Multiple priors including most recent CT June 24, 2020. FINDINGS: CT CHEST FINDINGS Cardiovascular: Aortic atherosclerosis without aneurysmal dilation. No central pulmonary embolus on this nondedicated study. Coronary artery calcifications. Normal size heart. No significant pericardial effusion/thickening. Mediastinum/Nodes: No supraclavicular adenopathy. No discrete thyroid nodule. No pathologically enlarged mediastinal, hilar axillary lymph  nodes. Similar wall thickening of the mid/distal esophagus. Trachea is unremarkable. Lungs/Pleura: Biapical pleuroparenchymal scarring appears similar prior. Scarring in the lingula and right middle lobe. No suspicious pulmonary nodules or masses. No pleural effusion. No pneumothorax. Musculoskeletal: Bilateral breast prostheses. No aggressive lytic or blastic lesion of bone. CT ABDOMEN PELVIS FINDINGS Hepatobiliary: No suspicious hepatic lesion. Cholecystectomy. Stable prominence of the biliary tree with the common duct measuring up to 1.2 cm, unchanged from prior and likely reservoir effect post cholecystectomy. Pancreas: No pancreatic ductal dilation or evidence of acute inflammation. Spleen: No splenomegaly or focal splenic lesion. Adrenals/Urinary Tract: Bilateral adrenal glands are unremarkable. No hydronephrosis. Bilateral hypodense renal lesions are technically too small to accurately characterize but statistically likely reflect cysts. No solid enhancing renal mass. Urinary bladder is unremarkable. Stomach/Bowel: Radiopaque enteric contrast material traverses the ascending colon. Stomach is unremarkable for degree of distension. Small bowel small bowel enterotomy sutures in left upper quadrant. No pathologic dilation of large or small bowel. Colonic diverticulosis without findings of acute diverticulitis. Vascular/Lymphatic: Aortic and branch vessel atherosclerosis without abdominal aortic aneurysm. No pathologically enlarged abdominal or pelvic lymph nodes. Reproductive: Status post hysterectomy. No adnexal masses. Other: No significant abdominopelvic free fluid. Musculoskeletal: Mild multilevel degenerative changes spine. No acute osseous abnormality. IMPRESSION: 1. No pathologically enlarged lymph nodes within the chest, abdomen, or pelvis and no splenomegaly. 2. Similar wall thickening of the mid/distal esophagus, possibly reflecting esophagitis. However, underlying neoplastic process can not be  excluded, consider further evaluation with upper endoscopy if not recently performed. 3. Colonic  diverticulosis without findings of acute diverticulitis. 4.  Aortic Atherosclerosis (ICD10-I70.0). Electronically Signed   By: Dahlia Bailiff M.D.   On: 12/27/2020 12:20    ASSESSMENT & PLAN Amy [name] 85 y.o. female with medical history significant for Stage II DLBCL of the tonsil who presents for a follow up visit.    After review the labs, the records, discussion with the patient the findings most consistent with a stage II diffuse large B-cell lymphoma of the tonsil.  She has involvement of the right tonsil as well as axillary lymph nodes which would constitute stage II disease.  She does have some mild activity in other lymph nodes throughout the body, but nowhere near as active as the ones within her head neck.  Lymphomas involvement of these lymph nodes is not likely and therefore I would proceed with treatment as if the patient were a stage II.  Previously we discussed the diagnosis of diffuse large B-cell lymphoma and the treatment options moving forward.  The regimen most recommended for this patient would be R mini CHOP with consideration for 3 cycles followed by radiation to the local lymph nodes.  This is consistent with the treatment course to be recommended with R-CHOP chemotherapy for stage II disease.  We are still currently waiting for the results of the double hit panel, however this would not likely change management as the patient would not be able to tolerate full strength chemotherapy with something like R-EPOCH.  We also discussed the risks and benefits of this R-CHOP chemotherapy including constipation, neurotoxicity, cardiac toxicity, drop in blood counts, fatigue, nausea, vomiting, and alopecia.  The patient voiced her understanding of these complications and wished to proceed forward with treatment.  The regimen of R- miniCHOP consists of rituximab 325 mg per metered squared,  cyclophosphamide 400 mg per metered squared IV, doxorubicin 25 mg per metered squared IV, vincristine 1 mg IV, and prednisone 40 mg per metered squared p.o. on days 1 through 5.  This will be pursued for 3 cycles of chemotherapy with the addition of radiation therapy.  Based on NCCN recommendations, member institutes do practice this regimen (short course chemo + RT), though data is not robust.  # Diffuse Large B Cell Lymphoma, Stage II --complete response noted on PET CT scan on 01/08/2020. No evidence of recurrent on CT scans from 12/27/2020.  --completed 3 cycles of R-mini-CHOP  followed by localized radiation.  -- TTE shows EF 60-65% with mild diastolic dysfunction --patient has established care with Dr. Isidore Moos and underwent radiation therapy which ended on 02/27/2020.  --Hgb 12.4 today. --repeat CT scan can be performed every 6 months x 2 years, then only as clinically indicated. Next due in June 2022 --RTC in 3 months time for continued f/u.   No orders of the defined types were placed in this encounter.  All questions were answered. The patient knows to call the clinic with any problems, questions or concerns.  A total of more than 30 minutes were spent on this encounter and over half of that time was spent on counseling and coordination of care as outlined above.   Ledell Peoples, MD Department of Hematology/Oncology [place] at [facility] Phone: [phone] Pager: [phone] Email: [email or web address]  12/29/2020 4:05 PM

## 2021-02-07 ENCOUNTER — Ambulatory Visit (INDEPENDENT_AMBULATORY_CARE_PROVIDER_SITE_OTHER): Payer: Medicare Other | Admitting: Cardiology

## 2021-02-07 ENCOUNTER — Other Ambulatory Visit: Payer: Self-pay

## 2021-02-07 ENCOUNTER — Encounter: Payer: Self-pay | Admitting: Cardiology

## 2021-02-07 VITALS — BP 124/66 | HR 63 | Ht 65.0 in | Wt 124.0 lb

## 2021-02-07 DIAGNOSIS — I10 Essential (primary) hypertension: Secondary | ICD-10-CM

## 2021-02-07 DIAGNOSIS — R002 Palpitations: Secondary | ICD-10-CM | POA: Diagnosis not present

## 2021-02-07 DIAGNOSIS — E785 Hyperlipidemia, unspecified: Secondary | ICD-10-CM | POA: Diagnosis not present

## 2021-02-07 DIAGNOSIS — R010 Benign and innocent cardiac murmurs: Secondary | ICD-10-CM

## 2021-02-07 DIAGNOSIS — Z8679 Personal history of other diseases of the circulatory system: Secondary | ICD-10-CM | POA: Diagnosis not present

## 2021-02-07 NOTE — Progress Notes (Signed)
Primary Care Provider: Aletha Halim., PA-C Cardiologist: Glenetta Hew, MD Electrophysiologist: None  Clinic Note: Chief Complaint  Patient presents with   Follow-up    2 months.   Shortness of Breath    Pretty much baseline.  No change.  Not associate with chest pain   Palpitations    Seem to have improved with only 2 episodes of fluttering while wearing monitor.  Better on slightly increased dose of beta-blocker.    ===================================  ASSESSMENT/PLAN   Problem List Items Addressed This Visit       Cardiology Problems   Essential hypertension (Chronic)    Blood pressure actually is pretty well controlled with amlodipine 5 mg and Lopressor 25 mg twice daily. On low-dose furosemide which she may or may not be taking every day.  Okay to hold the dose, and okay to take extra doses for worsening edema.      Hyperlipidemia (Chronic)    On high-dose pravastatin.  As of last labs.  Lipids well controlled.  Tolerating well.  No change.        Other   History of atrial fibrillation without current medication (Chronic)    Nothing to suggest atrial fibrillation on 28-day monitor.  She really does not have prolonged fluttering sensations.  Plan: Continue current dose metoprolol 25 mg twice daily and aspirin unless there are recurrent episodes.      Functional heart murmur    Soft murmur heard on exam.  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  Relatively recent echo did not show any evidence of stenosis.  Therefore given her advanced age, would not evaluate further.      Palpitations - Primary (Chronic)    Short little bursts may potentially be the atrial runs that she is feeling, but was not noted on the monitor results.  Overall not all that symptomatic.  No evidence of A-fib. Short atrial burst or not of concern.  I reassured her. Continue current dose of beta-blocker Okay to-titrate up if necessary and blood pressure will tolerate.       ===================================  HPI:    Amy [name] is a 86 y.o. female with a PMH notable for COPD Gold 3, diffuse large B-cell lymphoma, hypothyroidism, hyperlipidemia and PAF (first noted in 2015-with no documented recurrence) who presents today for follow-up evaluation of Palpitations and History of A. fib.Jackelyn Poling was last seen on 11/22/2020 at the request of Aletha Halim., PA-C. - noted lots of stress.  Feeling occasional fluttering in chest. - no syncope/near syncope.  No CP but + DOE taking trashcan up the driveway.  Mild end of day ankle swelling - goes down with elevation.  . Recommended continue beta-blocker, but corrected dosing to 25 mg twice daily Sequential 14-day Zio patch is ordered. Recommend holding off DOAC unless there is clear evidence of A-fib. Dyspnea felt related to her severe COPD.  Recent Hospitalizations: None  Reviewed  CV studies:    The following studies were reviewed today: (if available, images/films reviewed: From Epic Chart or Care Everywhere) Overall 28-day Zio patch findings: Predominantly sinus rhythm: HR range 47-102 bpm, average 63 bpm. Rare PACs and PVCs noted with some couplets. Total of 9 atrial runs: Fastest 8 beats-139 bpm.,  Longest 9 beats with a rate of 90 bpm. No sustained arrhythmias either fast or slow: No atrial fibrillation, atrial flutter, supraventricular tachycardia or ventricular tachycardia. No significant bradycardia or pauses. Symptoms noted with sinus rhythm, and occasionally with sinus  rhythm and PVCs or PACs   Zio patch monitor #2 Second monitor showed similar findings as initial monitor: Predominantly sinus rhythm with heart rate range of 48 to 102 bpm. Average 63 bpm. Again rare PACs and PVCs noted. No sustained arrhythmias noted. 7 atrial runs noted: Fastest was 8 beats at a rate of 138 bpm, longest was 9 beats at a rate of 90 bpm Zio patch monitor #1: Predominant underlying rhythm is sinus rhythm  with heart rate range of 49 to 95 bpm with an average of 63 bpm. Rare PACs and PVCs noted with a roughly 22nd run of ventricular trigeminy. No sustained arrhythmias either fast or slow: No atrial fibrillation, atrial flutter, supraventricular tachycardia or ventricular tachycardia. 2 atrial runs with the longest being 6 beats at a rate of 95 bpm and fastest 6 beats at a rate of 102 bpm.  Interval History:   MYLEIGH AMARA returns here today overall doing well after her initial evaluation.  She says that back when I saw her she was under quite a bit of stress.  She really noticed that much in the way of any significant fluttering sensations while she was wearing the monitor.  She maybe had 2 episodes or in the monitor that were not correlating with any type of abnormal findings.  It is quite possible that she felt the atrial runs.  She did say that her heart rate has gone down at baseline with the increased dose of metoprolol to 25 mg twice daily.  Otherwise stable exertional dyspnea but no chest pain or pressure.  She is not having the chest discomfort that she noted during initial evaluation. No PND, orthopnea with trivial end of day edema..  No syncope or near syncope.  No TIA or amaurosis fugax.  REVIEWED OF SYSTEMS   Review of Systems  Constitutional:  Positive for malaise/fatigue.  HENT:  Negative for congestion.   Respiratory:  Positive for cough and shortness of breath.        Relatively chronic.  Dyspnea is worse with exertion.  Not as much at rest  Cardiovascular:  Negative for chest pain.       Per HPI.  Still has mild palpitations and trivial leg swelling at the end of the day.  Gastrointestinal:  Negative for blood in stool and melena.  Genitourinary:  Negative for hematuria.  Musculoskeletal:  Positive for back pain and joint pain (Knee and hip OA pain).  Neurological:  Positive for dizziness (Some positional dizziness) and weakness (Generalized). Negative for tingling, focal  weakness and headaches.  Psychiatric/Behavioral:  Positive for memory loss. Negative for depression. The patient is nervous/anxious (Has noted intermittent spells of lots of stress.  Notably more during the holiday season.  Better now that the year is over in the year began.).    I have reviewed and (if needed) personally updated the patient's problem list, medications, allergies, past medical and surgical history, social and family history.   PAST MEDICAL HISTORY   Past Medical History:  Diagnosis Date   Anxiety    Arthritis    COPD (chronic obstructive pulmonary disease) ([place])    Esophageal stricture    s/p repeated dilations, Dr. Watt Climes   GERD (gastroesophageal reflux disease)    Hearing loss bilateral   has hearing aids   Heart murmur    With no gross abnormalities on echocardiogram.   Hiatal hernia    History of blood transfusion    many years ago    Hyperlipidemia  Hypothyroidism    large b cell lymphoma 04/2019   Osteoarthritis    Osteoporosis    Paroxysmal atrial fibrillation ([place])    1 confirm documented episode of A. fib; not on anticoagulation.   Pulmonary nodules    Renal lesion    1cm left kidney    PAST SURGICAL HISTORY   Past Surgical History:  Procedure Laterality Date   ABDOMINAL HYSTERECTOMY     BREAST ENHANCEMENT SURGERY  01/10/2004   CATARACT EXTRACTION W/ INTRAOCULAR LENS  IMPLANT, BILATERAL  '09   ESOPHAGEAL DILATION     IR IMAGING GUIDED PORT INSERTION  11/05/2019   IR REMOVAL TUN ACCESS W/ PORT W/O FL MOD SED  05/03/2020   KNEE ARTHROSCOPY  02/22/2011   Procedure: ARTHROSCOPY KNEE;  Surgeon: Gearlean Alf, MD;  Location: [facility];  Service: Orthopedics;  Laterality: Right;  WITH DEBRIDEMENT    LAPAROSCOPIC SMALL BOWEL RESECTION N/A 06/02/2016   Procedure: DIAGNOSTIC LAPAROSCOPY SMALL BOWEL RESECTION;  Surgeon: Armandina Gemma, MD;  Location: [facility];  Service: General;  Laterality: N/A;   TRANSTHORACIC ECHOCARDIOGRAM   03/24/2016   a) (In setting of COPD exacerbation-pneumonia) EF 60 to 65%. ~Appear to be in A. Fib.  Unable to assess diastolic function.  Normal wall motion.  Mild LA dilation.  Trivial MR.  Elevated PAP estimated 53 mmHg, RAP 15 mm.; b) 07/29/2018: Vigorous LV function, EF> 65%.  Normal wall motion.  GR 1 DD with elevated LAP.  Normal RV size and function.  Normal valves.  Trivial MR.   TRANSTHORACIC ECHOCARDIOGRAM  10/2019   EF 60 to 65%.  Normal LV size and function.  No R WMA.  GR 1 DD.  Normal PA/RVP with normal RV.  Normal RAP.  Grossly normal aortic and mitral valves.  No significant change from prior study.   Zio Patch Monitor-back-to-back 14 days (28-day total)  12/2020   Predominantly SR: HR range 47-102 bpm, AVG 63 bpm.  Rare PACs and PVCs.  9 atrial runs: Fastest 8 beats 139 bpm, longest 9 beats 90 bpm.  No sustained arrhythmias either fast or slow.  No pauses.  Symptoms noted with sinus rhythm and occasionally with sinus rhythm associated with PACs or PVCs.    Immunization History  Administered Date(s) Administered   Influenza Split 09/29/2011, 11/10/2011, 10/23/2019   Influenza, High Dose Seasonal PF 11/03/2015, 10/03/2017, 09/03/2018, 09/27/2020   Influenza,inj,quad, With Preservative 09/30/2012   Moderna Sars-Covid-2 Vaccination 04/08/2019, 05/06/2019, 11/12/2019   Pneumococcal Conjugate-13 01/10/2008, 11/24/2013   Pneumococcal Polysaccharide-23 01/10/2008, 10/09/2009, 12/17/2018   Tdap 10/04/2016   Zoster Recombinat (Shingrix) 01/16/2019, 03/19/2019   Zoster, Live 02/10/2008    MEDICATIONS/ALLERGIES   Current Meds  Medication Sig   albuterol (VENTOLIN HFA) 108 (90 Base) MCG/ACT inhaler Inhale 2 puffs into the lungs every 4 (four) hours as needed.   ALPRAZolam (XANAX) 0.25 MG tablet Take 0.25 mg by mouth 2 (two) times daily.   amLODipine (NORVASC) 5 MG tablet Take 5 mg by mouth daily.   aspirin EC 81 MG tablet Take 81 mg by mouth at bedtime.   atorvastatin (LIPITOR) 20 MG  tablet Take 20 mg by mouth at bedtime.   Cholecalciferol (VITAMIN D3) 2000 UNITS TABS Take 2,000 Units by mouth at bedtime.    furosemide (LASIX) 20 MG tablet Take by mouth.   gabapentin (NEURONTIN) 100 MG capsule TAKE 1 CAP EVERY MORNING, 1 CAP IN THE AFTERNOON, AND 2 CAPS EVERY DAY AT BEDTIME   levothyroxine (SYNTHROID) 88 MCG tablet  Take 88 mcg by mouth daily.   metoprolol tartrate (LOPRESSOR) 25 MG tablet Take 25 mg by mouth 2 (two) times daily.   pantoprazole (PROTONIX) 40 MG tablet Take 40 mg by mouth 2 (two) times daily.    Potassium 99 MG TABS Take 99 mg by mouth at bedtime.   pravastatin (PRAVACHOL) 80 MG tablet 1 tablet   traMADol (ULTRAM) 50 MG tablet Take 50 mg by mouth 2 (two) times daily as needed for moderate pain.    vitamin B-12 (CYANOCOBALAMIN) 1000 MCG tablet Take 1,000 mcg by mouth daily.    Allergies  Allergen Reactions   Other Anaphylaxis    Swelling in throat Bee Sting   Codeine Other (See Comments)    Reaction:  Hallucinations Other reaction(s): Confusion    SOCIAL HISTORY/FAMILY HISTORY   Reviewed in Epic:  Pertinent findings:  Social History   Tobacco Use   Smoking status: Former    Packs/day: 1.00    Years: 30.00    Pack years: 30.00    Types: Cigarettes    Quit date: 01/09/1997    Years since quitting: 24.1   Smokeless tobacco: Never  Vaping Use   Vaping Use: Never used  Substance Use Topics   Alcohol use: No   Drug use: No   Social History   Social History Narrative   Right handed    Live alone   Caffeine use: 1 cup coffee every morning    OBJCTIVE -PE, EKG, labs   Wt Readings from Last 3 Encounters:  02/07/21 124 lb (56.2 kg)  12/29/20 124 lb 8 oz (56.5 kg)  11/22/20 123 lb 6.4 oz (56 kg)    Physical Exam: BP 124/66 (BP Location: Left Arm, Patient Position: Sitting, Cuff Size: Normal)    Pulse 63    Ht 5\' 5"  (1.651 m)    Wt 124 lb (56.2 kg)    BMI 20.63 kg/m  Physical Exam Constitutional:      General: She is not in acute  distress.    Appearance: She is not ill-appearing (Frail elderly woman.  Well-groomed.) or toxic-appearing.  HENT:     Head: Normocephalic and atraumatic.     Comments: Very hard of hearing Cardiovascular:     Rate and Rhythm: Normal rate and regular rhythm.     Pulses: Intact distal pulses. Decreased pulses.     Heart sounds: S1 normal and S2 normal. Murmur (Soft 1/6 SEM at RUSB.) heard.    No friction rub. No gallop.  Pulmonary:     Effort: Pulmonary effort is normal. No respiratory distress.     Breath sounds: Wheezing (Expiratory) present. No rhonchi or rales.     Comments: Diminished breath sounds throughout.  Mild interstitial sounds. Chest:     Chest wall: No tenderness.  Musculoskeletal:        General: No swelling (Trivial ankle swelling).  Skin:    General: Skin is warm and dry.  Neurological:     General: No focal deficit present.     Mental Status: She is alert and oriented to person, place, and time.     Gait: Gait normal.  Psychiatric:        Mood and Affect: Mood normal.        Behavior: Behavior normal.     Comments: Poor historian.  Somewhat poor insight.     Adult ECG Report N/A  Recent Labs: Reviewed N/A  No results found for: CHOL, HDL, LDLCALC, LDLDIRECT, TRIG, CHOLHDL Lab Results  Component  Value Date   CREATININE 0.80 12/24/2020   BUN 5 (L) 12/24/2020   NA 139 12/24/2020   K 3.1 (L) 12/24/2020   CL 97 (L) 12/24/2020   CO2 34 (H) 12/24/2020   CBC Latest Ref Rng & Units 12/24/2020 09/20/2020 06/16/2020  WBC 4.0 - 10.5 K/uL 5.6 6.6 6.4  Hemoglobin 12.0 - 15.0 g/dL 12.4 11.8(L) 13.5  Hematocrit 36.0 - 46.0 % 38.0 35.6(L) 41.3  Platelets 150 - 400 K/uL 248 234 251    Lab Results  Component Value Date   HGBA1C 5.9 (H) 07/28/2018   Lab Results  Component Value Date   TSH 1.058 07/28/2018    ==================================================  COVID-19 Education: The signs and symptoms of COVID-19 were discussed with the patient and how to seek  care for testing (follow up with PCP or arrange E-visit).    I spent a total of 18 minutes with the patient spent in direct patient consultation.  Additional time spent with chart review  / charting (studies, outside notes, etc): 18 min Total Time: 36 min  Current medicines are reviewed at length with the patient today.  (+/- concerns) none  This visit occurred during the SARS-CoV-2 public health emergency.  Safety protocols were in place, including screening questions prior to the visit, additional usage of staff PPE, and extensive cleaning of exam room while observing appropriate contact time as indicated for disinfecting solutions.  Notice: This dictation was prepared with Dragon dictation along with smart phrase technology. Any transcriptional errors that result from this process are unintentional and may not be corrected upon review.  Studies Ordered:   No orders of the defined types were placed in this encounter.   Patient Instructions / Medication Changes & Studies & Tests Ordered   Patient Instructions  Medication Instructions:  Not needed *If you need a refill on your cardiac medications before your next appointment, please call your pharmacy*   Lab Work:  Not needed   Testing/Procedures: Not needed   Follow-Up: At [facility], you and your health needs are our priority.  As part of our continuing mission to provide you with exceptional heart care, we have created designated Provider Care Teams.  These Care Teams include your primary Cardiologist (physician) and Advanced Practice Providers (APPs -  Physician Assistants and Nurse Practitioners) who all work together to provide you with the care you need, when you need it.  We recommend signing up for the patient portal called "MyChart".  Sign up information is provided on this After Visit Summary.  MyChart is used to connect with patients for Virtual Visits (Telemedicine).  Patients are able to view lab/test results,  encounter notes, upcoming appointments, etc.  Non-urgent messages can be sent to your provider as well.   To learn more about what you can do with MyChart, go to [email or web address].    Your next appointment:   As needed     The format for your next appointment:   In Person  Provider:   Glenetta Hew, MD          Glenetta Hew, M.D., M.S. Interventional Cardiologist   Pager # [phone] Phone # [phone] [address]. [place], [place] [address]   Thank you for choosing Heartcare at [facility]!!

## 2021-02-07 NOTE — Patient Instructions (Signed)
Medication Instructions:  Not needed *If you need a refill on your cardiac medications before your next appointment, please call your pharmacy*   Lab Work:  Not needed   Testing/Procedures: Not needed   Follow-Up: At [facility], you and your health needs are our priority.  As part of our continuing mission to provide you with exceptional heart care, we have created designated Provider Care Teams.  These Care Teams include your primary Cardiologist (physician) and Advanced Practice Providers (APPs -  Physician Assistants and Nurse Practitioners) who all work together to provide you with the care you need, when you need it.  We recommend signing up for the patient portal called "MyChart".  Sign up information is provided on this After Visit Summary.  MyChart is used to connect with patients for Virtual Visits (Telemedicine).  Patients are able to view lab/test results, encounter notes, upcoming appointments, etc.  Non-urgent messages can be sent to your provider as well.   To learn more about what you can do with MyChart, go to [email or web address].    Your next appointment:   As needed     The format for your next appointment:   In Person  Provider:   Glenetta Hew, MD

## 2021-03-07 ENCOUNTER — Telehealth: Payer: Self-pay | Admitting: *Deleted

## 2021-03-07 NOTE — Telephone Encounter (Signed)
Received vm message from pt stating she found a reddish pink area on her left breast.  TCT patient to advise that she contact her PCP. Spoke with her and advised that she see her PCP. Amy [name] voiced understanding and stated she would call her PCP

## 2021-03-10 ENCOUNTER — Telehealth: Payer: Self-pay | Admitting: Hematology and Oncology

## 2021-03-10 NOTE — Telephone Encounter (Signed)
R/s per provider pal, message has been left with pt ?

## 2021-03-13 ENCOUNTER — Encounter: Payer: Self-pay | Admitting: Cardiology

## 2021-03-13 NOTE — Assessment & Plan Note (Signed)
Soft murmur heard on exam.  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  Relatively recent echo did not show any evidence of stenosis.  Therefore given her advanced age, would not evaluate further. ?

## 2021-03-13 NOTE — Assessment & Plan Note (Signed)
Blood pressure actually is pretty well controlled with amlodipine 5 mg and Lopressor 25 mg twice daily. ?On low-dose furosemide which she may or may not be taking every day.  Okay to hold the dose, and okay to take extra doses for worsening edema. ?

## 2021-03-13 NOTE — Assessment & Plan Note (Signed)
On high-dose pravastatin.  As of last labs.  Lipids well controlled.  Tolerating well.  No change. ?

## 2021-03-13 NOTE — Assessment & Plan Note (Signed)
Nothing to suggest atrial fibrillation on 28-day monitor.  She really does not have prolonged fluttering sensations. ? ?Plan: Continue current dose metoprolol 25 mg twice daily and aspirin unless there are recurrent episodes. ?

## 2021-03-13 NOTE — Assessment & Plan Note (Signed)
Short little bursts may potentially be the atrial runs that she is feeling, but was not noted on the monitor results.  Overall not all that symptomatic.  No evidence of A-fib. ?Short atrial burst or not of concern.  I reassured her. ?Continue current dose of beta-blocker ?? Okay to-titrate up if necessary and blood pressure will tolerate. ?

## 2021-03-15 ENCOUNTER — Encounter: Payer: Self-pay | Admitting: Hematology and Oncology

## 2021-03-15 ENCOUNTER — Ambulatory Visit
Admission: RE | Admit: 2021-03-15 | Discharge: 2021-03-15 | Disposition: A | Discharge status: Home / Self Care | Payer: Medicare Other | Source: Ambulatory Visit | Attending: Family Medicine | Admitting: Family Medicine

## 2021-03-15 DIAGNOSIS — Z1231 Encounter for screening mammogram for malignant neoplasm of breast: Secondary | ICD-10-CM

## 2021-03-18 ENCOUNTER — Telehealth: Payer: Self-pay | Admitting: Hematology and Oncology

## 2021-03-18 NOTE — Telephone Encounter (Signed)
Called patient regarding upcoming appointments, patient is notified. 

## 2021-03-30 ENCOUNTER — Ambulatory Visit: Payer: Medicare Other | Admitting: Hematology and Oncology

## 2021-03-30 ENCOUNTER — Other Ambulatory Visit: Payer: Medicare Other

## 2021-04-14 ENCOUNTER — Inpatient Hospital Stay: Payer: Medicare Other | Attending: Hematology and Oncology

## 2021-04-14 ENCOUNTER — Other Ambulatory Visit: Payer: Self-pay

## 2021-04-14 ENCOUNTER — Inpatient Hospital Stay: Payer: Medicare Other | Admitting: Hematology and Oncology

## 2021-04-14 VITALS — BP 120/55 | HR 60 | Temp 97.8°F | Resp 15 | Ht 65.0 in | Wt 122.2 lb

## 2021-04-14 DIAGNOSIS — Z8572 Personal history of non-Hodgkin lymphomas: Secondary | ICD-10-CM | POA: Diagnosis present

## 2021-04-14 DIAGNOSIS — C8331 Diffuse large B-cell lymphoma, lymph nodes of head, face, and neck: Secondary | ICD-10-CM

## 2021-04-14 DIAGNOSIS — Z923 Personal history of irradiation: Secondary | ICD-10-CM | POA: Insufficient documentation

## 2021-04-14 DIAGNOSIS — Z87891 Personal history of nicotine dependence: Secondary | ICD-10-CM | POA: Diagnosis not present

## 2021-04-14 DIAGNOSIS — Z9221 Personal history of antineoplastic chemotherapy: Secondary | ICD-10-CM | POA: Insufficient documentation

## 2021-04-14 DIAGNOSIS — Z7982 Long term (current) use of aspirin: Secondary | ICD-10-CM | POA: Insufficient documentation

## 2021-04-14 DIAGNOSIS — Z79899 Other long term (current) drug therapy: Secondary | ICD-10-CM | POA: Insufficient documentation

## 2021-04-14 LAB — CMP (CANCER CENTER ONLY)
ALT: 14 U/L (ref 0–44)
AST: 20 U/L (ref 15–41)
Albumin: 4 g/dL (ref 3.5–5.0)
Alkaline Phosphatase: 100 U/L (ref 38–126)
Anion gap: 3 — ABNORMAL LOW (ref 5–15)
BUN: 8 mg/dL (ref 8–23)
CO2: 34 mmol/L — ABNORMAL HIGH (ref 22–32)
Calcium: 9.3 mg/dL (ref 8.9–10.3)
Chloride: 100 mmol/L (ref 98–111)
Creatinine: 0.71 mg/dL (ref 0.44–1.00)
GFR, Estimated: >60 mL/min (ref >60)
Glucose, Bld: 100 mg/dL — ABNORMAL HIGH (ref 70–99)
Potassium: 3.8 mmol/L (ref 3.5–5.1)
Sodium: 137 mmol/L (ref 135–145)
Total Bilirubin: 1 mg/dL (ref 0.3–1.2)
Total Protein: 7 g/dL (ref 6.5–8.1)

## 2021-04-14 LAB — CBC WITH DIFFERENTIAL (CANCER CENTER ONLY)
Abs Immature Granulocytes: 0.02 10*3/uL (ref 0.00–0.07)
Basophils Absolute: 0 10*3/uL (ref 0.0–0.1)
Basophils Relative: 1 %
Eosinophils Absolute: 0.2 10*3/uL (ref 0.0–0.5)
Eosinophils Relative: 3 %
HCT: 37.1 % (ref 36.0–46.0)
Hemoglobin: 12.3 g/dL (ref 12.0–15.0)
Immature Granulocytes: 0 %
Lymphocytes Relative: 54 %
Lymphs Abs: 3.7 10*3/uL (ref 0.7–4.0)
MCH: 32.4 pg (ref 26.0–34.0)
MCHC: 33.2 g/dL (ref 30.0–36.0)
MCV: 97.6 fL (ref 80.0–100.0)
Monocytes Absolute: 0.5 10*3/uL (ref 0.1–1.0)
Monocytes Relative: 8 %
Neutro Abs: 2.4 10*3/uL (ref 1.7–7.7)
Neutrophils Relative %: 34 %
Platelet Count: 279 10*3/uL (ref 150–400)
RBC: 3.8 MIL/uL — ABNORMAL LOW (ref 3.87–5.11)
RDW: 13.8 % (ref 11.5–15.5)
WBC Count: 6.9 10*3/uL (ref 4.0–10.5)
nRBC: 0 % (ref 0.0–0.2)

## 2021-04-14 LAB — LACTATE DEHYDROGENASE: LDH: 136 U/L (ref 98–192)

## 2021-04-14 NOTE — Progress Notes (Signed)
?[place] ?Telephone:(336) [phone]   [phone] ? ?PROGRESS NOTE ? ?Patient Care Team: ?Amie Critchley as PCP - General (Family Medicine) ?Leonie Man, MD as PCP - Cardiology (Cardiology) ?Eppie Gibson, MD as Consulting Physician (Radiation Oncology) ?Malmfelt, Stephani Police, RN as Oncology Nurse Navigator ?Orson Slick, MD as Consulting Physician (Hematology and Oncology) ? ?Hematological/Oncological History ?# Diffuse Large B Cell Lymphoma, Stage II ?1) 09/26/2019: CT scan of neck performed due to persistent tonsillar lesion after outpatient antibiotic course. CT scan showed a heterogeneous right tonsillar mass up to 3.2 cm and a small but suspicious right level 2A lymph node  ?2) 09/30/2019: patient underwent biopsy with ENT which revealed a DLBCL with FISH for MYC, BCL2 and BCL6 pending ?3) 10/08/2019: establish care with Dr. Lorenso Courier  ?4) 10/21/2019: PET scan showed hypermetabolic right tonsillar mass and adjacent right level 2 adenopathy, consistent with the given history of lymphoma. ?5) 11/13/2019: Cycle 1 Day 1 of R-miniCHOP ?6) 12/05/2019: Cycle 2 Day 1 of R-miniCHOP ?7) 12/25/2019: Cycle 3 Day 1 of R-miniCHOP ?8) 02/04/2020-02/27/2020: radiation therapy.  ?9) 12/27/2020: CT Chest/Ab/Pelvis/Neck showed no evidence of disease recurrence/progression.  ? ?Interval History:  ?Amy [name] 86 y.o. female with medical history significant for Stage II DLBCL of the tonsil who presents for a follow up visit. The patient's last visit was on 09/20/2020. In the interim since the last visit she has had no major changes in her health. ? ?On exam today Amy [name] reports she feels good overall.  She notes that she did have 2 toenails that were ingrown removed on Tuesday without any anesthesia or local anesthetic.  She reports that she has been taking iron pills per the recommendation of her PCP.  She notes there is no boost in her energy throughout the results of this medication.  The  iron pills do not cause any stomach upset.  She reports her weight tends to fluctuate around 1 to 2 pounds with no major weight gain or loss.  She currently denies any fevers, chills, sweats, nausea, or diarrhea.  She denies any palpable lymphadenopathy.  Otherwise she reports that she feels well.  A full 10 point ROS is listed below. ? ?MEDICAL HISTORY:  ?Past Medical History:  ?Diagnosis Date  ? Anxiety   ? Arthritis   ? COPD (chronic obstructive pulmonary disease) ([place])   ? Esophageal stricture   ? s/p repeated dilations, Dr. Watt Climes  ? GERD (gastroesophageal reflux disease)   ? Hearing loss bilateral  ? has hearing aids  ? Heart murmur   ? With no gross abnormalities on echocardiogram.  ? Hiatal hernia   ? History of blood transfusion   ? many years ago   ? Hyperlipidemia   ? Hypothyroidism   ? large b cell lymphoma 04/2019  ? Osteoarthritis   ? Osteoporosis   ? Paroxysmal atrial fibrillation (HCC)   ? 1 confirm documented episode of A. fib; not on anticoagulation.  ? Pulmonary nodules   ? Renal lesion   ? 1cm left kidney  ? ? ?SURGICAL HISTORY: ?Past Surgical History:  ?Procedure Laterality Date  ? ABDOMINAL HYSTERECTOMY    ? BREAST BIOPSY Left 10/19/2015  ? BREAST ENHANCEMENT SURGERY  01/10/2004  ? CATARACT EXTRACTION W/ INTRAOCULAR LENS  IMPLANT, BILATERAL  '09  ? ESOPHAGEAL DILATION    ? IR IMAGING GUIDED PORT INSERTION  11/05/2019  ? IR REMOVAL TUN ACCESS W/ PORT W/O FL MOD SED  05/03/2020  ?  KNEE ARTHROSCOPY  02/22/2011  ? Procedure: ARTHROSCOPY KNEE;  Surgeon: Gearlean Alf, MD;  Location: [facility];  Service: Orthopedics;  Laterality: Right;  WITH DEBRIDEMENT ?  ? LAPAROSCOPIC SMALL BOWEL RESECTION N/A 06/02/2016  ? Procedure: DIAGNOSTIC LAPAROSCOPY SMALL BOWEL RESECTION;  Surgeon: Armandina Gemma, MD;  Location: [facility];  Service: General;  Laterality: N/A;  ? TRANSTHORACIC ECHOCARDIOGRAM  03/24/2016  ? a) (In setting of COPD exacerbation-pneumonia) EF 60 to 65%. ~Appear to be in A. Fib.   Unable to assess diastolic function.  Normal wall motion.  Mild LA dilation.  Trivial MR.  Elevated PAP estimated 53 mmHg, RAP 15 mm.; b) 07/29/2018: Vigorous LV function, EF> 65%.  Normal wall motion.  GR 1 DD with elevated LAP.  Normal RV size and function.  Normal valves.  Trivial MR.  ? TRANSTHORACIC ECHOCARDIOGRAM  10/2019  ? EF 60 to 65%.  Normal LV size and function.  No R WMA.  GR 1 DD.  Normal PA/RVP with normal RV.  Normal RAP.  Grossly normal aortic and mitral valves.  No significant change from prior study.  ? Zio Patch Monitor-back-to-back 14 days (28-day total)  12/2020  ? Predominantly SR: HR range 47-102 bpm, AVG 63 bpm.  Rare PACs and PVCs.  9 atrial runs: Fastest 8 beats 139 bpm, longest 9 beats 90 bpm.  No sustained arrhythmias either fast or slow.  No pauses.  Symptoms noted with sinus rhythm and occasionally with sinus rhythm associated with PACs or PVCs.  ? ? ?SOCIAL HISTORY: ?Social History  ? ?Socioeconomic History  ? Marital status: Widowed  ?  Spouse name: Not on file  ? Number of children: 3  ? Years of education: 50  ? Highest education level: Not on file  ?Occupational History  ? Not on file  ?Tobacco Use  ? Smoking status: Former  ?  Packs/day: 1.00  ?  Years: 30.00  ?  Pack years: 30.00  ?  Types: Cigarettes  ?  Quit date: 01/09/1997  ?  Years since quitting: 24.2  ? Smokeless tobacco: Never  ?Vaping Use  ? Vaping Use: Never used  ?Substance and Sexual Activity  ? Alcohol use: No  ? Drug use: No  ? Sexual activity: Not Currently  ?Other Topics Concern  ? Not on file  ?Social History Narrative  ? Right handed   ? Live alone  ? Caffeine use: 1 cup coffee every morning  ? ?Social Determinants of Health  ? ?Financial Resource Strain: Not on file  ?Food Insecurity: Not on file  ?Transportation Needs: Not on file  ?Physical Activity: Not on file  ?Stress: Not on file  ?Social Connections: Not on file  ?Intimate Partner Violence: Not on file  ? ? ?FAMILY HISTORY: ?Family History  ?Problem  Relation Age of Onset  ? Heart disease Father   ? Rheum arthritis Father   ? Heart failure Mother   ? Diabetes Brother   ? ? ?ALLERGIES:  is allergic to other and codeine. ? ?MEDICATIONS:  ?Current Outpatient Medications  ?Medication Sig Dispense Refill  ? albuterol (VENTOLIN HFA) 108 (90 Base) MCG/ACT inhaler Inhale 2 puffs into the lungs every 4 (four) hours as needed.    ? ALPRAZolam (XANAX) 0.25 MG tablet Take 0.25 mg by mouth 2 (two) times daily.    ? amLODipine (NORVASC) 5 MG tablet Take 5 mg by mouth daily.    ? aspirin EC 81 MG tablet Take 81 mg by mouth at bedtime.    ?  atorvastatin (LIPITOR) 20 MG tablet Take 20 mg by mouth at bedtime.  11  ? Cholecalciferol (VITAMIN D3) 2000 UNITS TABS Take 2,000 Units by mouth at bedtime.     ? furosemide (LASIX) 20 MG tablet Take by mouth.    ? gabapentin (NEURONTIN) 100 MG capsule TAKE 1 CAP EVERY MORNING, 1 CAP IN THE AFTERNOON, AND 2 CAPS EVERY DAY AT BEDTIME 360 capsule 2  ? levothyroxine (SYNTHROID) 88 MCG tablet Take 88 mcg by mouth daily.    ? metoprolol tartrate (LOPRESSOR) 25 MG tablet Take 25 mg by mouth 2 (two) times daily.    ? pantoprazole (PROTONIX) 40 MG tablet Take 40 mg by mouth 2 (two) times daily.   11  ? Potassium 99 MG TABS Take 99 mg by mouth at bedtime.    ? pravastatin (PRAVACHOL) 80 MG tablet 1 tablet    ? traMADol (ULTRAM) 50 MG tablet Take 50 mg by mouth 2 (two) times daily as needed for moderate pain.     ? vitamin B-12 (CYANOCOBALAMIN) 1000 MCG tablet Take 1,000 mcg by mouth daily.    ? ?No current facility-administered medications for this visit.  ? ? ?REVIEW OF SYSTEMS:   ?Constitutional: ( - ) fevers, ( - )  chills , ( - ) night sweats ?Eyes: ( - ) blurriness of vision, ( - ) double vision, ( - ) watery eyes ?Ears, nose, mouth, throat, and face: ( - ) mucositis, ( - ) sore throat ?Respiratory: ( - ) cough, ( - ) dyspnea, ( - ) wheezes ?Cardiovascular: ( - ) palpitation, ( - ) chest discomfort, ( - ) lower extremity  swelling ?Gastrointestinal:  ( - ) nausea, ( - ) heartburn, ( - ) change in bowel habits ?Skin: ( - ) abnormal skin rashes ?Lymphatics: ( - ) new lymphadenopathy, ( - ) easy bruising ?Neurological: ( - ) numbness, ( - ) tingling,

## 2021-04-15 ENCOUNTER — Telehealth: Payer: Self-pay | Admitting: Hematology and Oncology

## 2021-04-15 NOTE — Telephone Encounter (Signed)
Scheduled per 4/6 los, pt has been called and confirmed  ?

## 2021-06-10 ENCOUNTER — Ambulatory Visit (HOSPITAL_COMMUNITY)
Admission: RE | Admit: 2021-06-10 | Discharge: 2021-06-10 | Disposition: A | Discharge status: Home / Self Care | Payer: Medicare Other | Source: Ambulatory Visit | Attending: Hematology and Oncology | Admitting: Hematology and Oncology

## 2021-06-10 DIAGNOSIS — C8331 Diffuse large B-cell lymphoma, lymph nodes of head, face, and neck: Secondary | ICD-10-CM | POA: Insufficient documentation

## 2021-06-10 DIAGNOSIS — K449 Diaphragmatic hernia without obstruction or gangrene: Secondary | ICD-10-CM | POA: Insufficient documentation

## 2021-06-10 DIAGNOSIS — I700 Atherosclerosis of aorta: Secondary | ICD-10-CM | POA: Diagnosis not present

## 2021-06-10 MED ORDER — IOHEXOL 9 MG/ML PO SOLN
ORAL | Status: AC
  Filled 2021-06-10: qty 1000

## 2021-06-10 MED ORDER — IOHEXOL 300 MG/ML  SOLN
100.0000 mL | Freq: Once | INTRAMUSCULAR | Status: AC | PRN
  Administered 2021-06-10: 100 mL via INTRAVENOUS

## 2021-06-13 ENCOUNTER — Telehealth: Payer: Self-pay | Admitting: *Deleted

## 2021-06-13 NOTE — Telephone Encounter (Signed)
-----   Message from Orson Slick, MD sent at 06/13/2021  9:05 AM EDT ----- Please let Amy [name] know that her CT scan showed no evidence of recurrent lymphoma. We will see her back in July as scheduled.  ----- Message ----- From: Interface, Rad Results In Sent: 06/13/2021   8:33 AM EDT To: Orson Slick, MD

## 2021-06-13 NOTE — Telephone Encounter (Signed)
TCT patient regarding recent CT scan. Spoke with pt and advised that her scans showed no evidence of recurrent lymphoma.  Pt very pleased with results. She is aware of her appt in July

## 2021-07-13 ENCOUNTER — Other Ambulatory Visit: Payer: Self-pay | Admitting: *Deleted

## 2021-07-13 DIAGNOSIS — C8331 Diffuse large B-cell lymphoma, lymph nodes of head, face, and neck: Secondary | ICD-10-CM

## 2021-07-14 ENCOUNTER — Inpatient Hospital Stay (HOSPITAL_BASED_OUTPATIENT_CLINIC_OR_DEPARTMENT_OTHER): Payer: Medicare Other | Admitting: Hematology and Oncology

## 2021-07-14 ENCOUNTER — Inpatient Hospital Stay: Payer: Medicare Other | Attending: Hematology and Oncology

## 2021-07-14 ENCOUNTER — Other Ambulatory Visit: Payer: Self-pay

## 2021-07-14 VITALS — BP 134/52 | HR 63 | Temp 97.9°F | Resp 16 | Ht 65.0 in | Wt 125.2 lb

## 2021-07-14 DIAGNOSIS — C8331 Diffuse large B-cell lymphoma, lymph nodes of head, face, and neck: Secondary | ICD-10-CM | POA: Diagnosis not present

## 2021-07-14 DIAGNOSIS — Z95828 Presence of other vascular implants and grafts: Secondary | ICD-10-CM

## 2021-07-14 DIAGNOSIS — Z79899 Other long term (current) drug therapy: Secondary | ICD-10-CM | POA: Insufficient documentation

## 2021-07-14 DIAGNOSIS — Z8572 Personal history of non-Hodgkin lymphomas: Secondary | ICD-10-CM | POA: Insufficient documentation

## 2021-07-14 DIAGNOSIS — Z7982 Long term (current) use of aspirin: Secondary | ICD-10-CM | POA: Insufficient documentation

## 2021-07-14 DIAGNOSIS — Z87891 Personal history of nicotine dependence: Secondary | ICD-10-CM | POA: Diagnosis not present

## 2021-07-14 DIAGNOSIS — Z9221 Personal history of antineoplastic chemotherapy: Secondary | ICD-10-CM | POA: Diagnosis not present

## 2021-07-14 LAB — CBC WITH DIFFERENTIAL (CANCER CENTER ONLY)
Abs Immature Granulocytes: 0.01 10*3/uL (ref 0.00–0.07)
Basophils Absolute: 0 10*3/uL (ref 0.0–0.1)
Basophils Relative: 1 %
Eosinophils Absolute: 0.2 10*3/uL (ref 0.0–0.5)
Eosinophils Relative: 3 %
HCT: 37.5 % (ref 36.0–46.0)
Hemoglobin: 12.3 g/dL (ref 12.0–15.0)
Immature Granulocytes: 0 %
Lymphocytes Relative: 43 %
Lymphs Abs: 2.7 10*3/uL (ref 0.7–4.0)
MCH: 32.7 pg (ref 26.0–34.0)
MCHC: 32.8 g/dL (ref 30.0–36.0)
MCV: 99.7 fL (ref 80.0–100.0)
Monocytes Absolute: 0.5 10*3/uL (ref 0.1–1.0)
Monocytes Relative: 9 %
Neutro Abs: 2.8 10*3/uL (ref 1.7–7.7)
Neutrophils Relative %: 44 %
Platelet Count: 228 10*3/uL (ref 150–400)
RBC: 3.76 MIL/uL — ABNORMAL LOW (ref 3.87–5.11)
RDW: 13.6 % (ref 11.5–15.5)
WBC Count: 6.3 10*3/uL (ref 4.0–10.5)
nRBC: 0 % (ref 0.0–0.2)

## 2021-07-14 LAB — CMP (CANCER CENTER ONLY)
ALT: 14 U/L (ref 0–44)
AST: 20 U/L (ref 15–41)
Albumin: 4.2 g/dL (ref 3.5–5.0)
Alkaline Phosphatase: 103 U/L (ref 38–126)
Anion gap: 3 — ABNORMAL LOW (ref 5–15)
BUN: 10 mg/dL (ref 8–23)
CO2: 39 mmol/L — ABNORMAL HIGH (ref 22–32)
Calcium: 9.5 mg/dL (ref 8.9–10.3)
Chloride: 98 mmol/L (ref 98–111)
Creatinine: 0.76 mg/dL (ref 0.44–1.00)
GFR, Estimated: >60 mL/min (ref >60)
Glucose, Bld: 97 mg/dL (ref 70–99)
Potassium: 3.8 mmol/L (ref 3.5–5.1)
Sodium: 140 mmol/L (ref 135–145)
Total Bilirubin: 0.7 mg/dL (ref 0.3–1.2)
Total Protein: 7.2 g/dL (ref 6.5–8.1)

## 2021-07-14 LAB — LACTATE DEHYDROGENASE: LDH: 132 U/L (ref 98–192)

## 2021-07-14 NOTE — Progress Notes (Signed)
[place] [phone]   Fax:(336) [phone]  PROGRESS NOTE  Patient Care Team: Aletha Halim., PA-C as PCP - General (Family Medicine) Leonie Man, MD as PCP - Cardiology (Cardiology) Eppie Gibson, MD as Consulting Physician (Radiation Oncology) Malmfelt, Stephani Police, RN as Oncology Nurse Navigator Orson Slick, MD as Consulting Physician (Hematology and Oncology)  Hematological/Oncological History # Diffuse Large B Cell Lymphoma, Stage II 1) 09/26/2019: CT scan of neck performed due to persistent tonsillar lesion after outpatient antibiotic course. CT scan showed a heterogeneous right tonsillar mass up to 3.2 cm and a small but suspicious right level 2A lymph node  2) 09/30/2019: patient underwent biopsy with ENT which revealed a DLBCL with FISH for MYC, BCL2 and BCL6 pending 3) 10/08/2019: establish care with Dr. Lorenso Courier  4) 10/21/2019: PET scan showed hypermetabolic right tonsillar mass and adjacent right level 2 adenopathy, consistent with the given history of lymphoma. 5) 11/13/2019: Cycle 1 Day 1 of R-miniCHOP 6) 12/05/2019: Cycle 2 Day 1 of R-miniCHOP 7) 12/25/2019: Cycle 3 Day 1 of R-miniCHOP 8) 02/04/2020-02/27/2020: radiation therapy.  9) 12/27/2020: CT Chest/Ab/Pelvis/Neck showed no evidence of disease recurrence/progression.   Interval History:  Amy [name] 86 y.o. female with medical history significant for Stage II DLBCL of the tonsil who presents for a follow up visit. The patient's last visit was on 04/14/2021. In the interim since the last visit she has had no major changes in her health.  On exam today Amy [name] reports she has been good overall interim since her last visit.  She notes that she tends to walk down the driveway and get quite hot and she gets winded a little easily but overall she has been quite well.  She reports yes to be "careful with the heat".  She notes her energy is good and her appetite is quite strong.  Her  weight has been quite stable.  She notes that she has not noticed any enlarging lymph nodes.  Overall she feels well and has no questions concerns or complaints.  She currently denies any fevers, chills, sweats, nausea, or diarrhea.  She denies any palpable lymphadenopathy.  Otherwise she reports that she feels well.  A full 10 point ROS is listed below.  MEDICAL HISTORY:  Past Medical History:  Diagnosis Date   Anxiety    Arthritis    COPD (chronic obstructive pulmonary disease) ([place])    Esophageal stricture    s/p repeated dilations, Dr. Watt Climes   GERD (gastroesophageal reflux disease)    Hearing loss bilateral   has hearing aids   Heart murmur    With no gross abnormalities on echocardiogram.   Hiatal hernia    History of blood transfusion    many years ago    Hyperlipidemia    Hypothyroidism    large b cell lymphoma 04/2019   Osteoarthritis    Osteoporosis    Paroxysmal atrial fibrillation ([place])    1 confirm documented episode of A. fib; not on anticoagulation.   Pulmonary nodules    Renal lesion    1cm left kidney    SURGICAL HISTORY: Past Surgical History:  Procedure Laterality Date   ABDOMINAL HYSTERECTOMY     BREAST BIOPSY Left 10/19/2015   BREAST ENHANCEMENT SURGERY  01/10/2004   CATARACT EXTRACTION W/ INTRAOCULAR LENS  IMPLANT, BILATERAL  '09   ESOPHAGEAL DILATION     IR IMAGING GUIDED PORT INSERTION  11/05/2019   IR REMOVAL TUN ACCESS W/ PORT W/O FL  MOD SED  05/03/2020   KNEE ARTHROSCOPY  02/22/2011   Procedure: ARTHROSCOPY KNEE;  Surgeon: Gearlean Alf, MD;  Location: [facility];  Service: Orthopedics;  Laterality: Right;  WITH DEBRIDEMENT    LAPAROSCOPIC SMALL BOWEL RESECTION N/A 06/02/2016   Procedure: DIAGNOSTIC LAPAROSCOPY SMALL BOWEL RESECTION;  Surgeon: Armandina Gemma, MD;  Location: [facility];  Service: General;  Laterality: N/A;   TRANSTHORACIC ECHOCARDIOGRAM  03/24/2016   a) (In setting of COPD exacerbation-pneumonia) EF 60 to 65%. ~Appear  to be in A. Fib.  Unable to assess diastolic function.  Normal wall motion.  Mild LA dilation.  Trivial MR.  Elevated PAP estimated 53 mmHg, RAP 15 mm.; b) 07/29/2018: Vigorous LV function, EF> 65%.  Normal wall motion.  GR 1 DD with elevated LAP.  Normal RV size and function.  Normal valves.  Trivial MR.   TRANSTHORACIC ECHOCARDIOGRAM  10/2019   EF 60 to 65%.  Normal LV size and function.  No R WMA.  GR 1 DD.  Normal PA/RVP with normal RV.  Normal RAP.  Grossly normal aortic and mitral valves.  No significant change from prior study.   Zio Patch Monitor-back-to-back 14 days (28-day total)  12/2020   Predominantly SR: HR range 47-102 bpm, AVG 63 bpm.  Rare PACs and PVCs.  9 atrial runs: Fastest 8 beats 139 bpm, longest 9 beats 90 bpm.  No sustained arrhythmias either fast or slow.  No pauses.  Symptoms noted with sinus rhythm and occasionally with sinus rhythm associated with PACs or PVCs.    SOCIAL HISTORY: Social History   Socioeconomic History   Marital status: Widowed    Spouse name: Not on file   Number of children: 3   Years of education: 34   Highest education level: Not on file  Occupational History   Not on file  Tobacco Use   Smoking status: Former    Packs/day: 1.00    Years: 30.00    Total pack years: 30.00    Types: Cigarettes    Quit date: 01/09/1997    Years since quitting: 24.5   Smokeless tobacco: Never  Vaping Use   Vaping Use: Never used  Substance and Sexual Activity   Alcohol use: No   Drug use: No   Sexual activity: Not Currently  Other Topics Concern   Not on file  Social History Narrative   Right handed    Live alone   Caffeine use: 1 cup coffee every morning   Social Determinants of Health   Financial Resource Strain: Not on file  Food Insecurity: Not on file  Transportation Needs: Not on file  Physical Activity: Not on file  Stress: Not on file  Social Connections: Not on file  Intimate Partner Violence: Not on file    FAMILY HISTORY: Family  History  Problem Relation Age of Onset   Heart disease Father    Rheum arthritis Father    Heart failure Mother    Diabetes Brother     ALLERGIES:  is allergic to other and codeine.  MEDICATIONS:  Current Outpatient Medications  Medication Sig Dispense Refill   fluticasone (FLONASE) 50 MCG/ACT nasal spray Administer 2 sprays in each nostril daily.     levothyroxine (SYNTHROID) 88 MCG tablet Take 1 tablet by mouth daily before breakfast.     albuterol (VENTOLIN HFA) 108 (90 Base) MCG/ACT inhaler Inhale 2 puffs into the lungs every 4 (four) hours as needed.     ALPRAZolam (XANAX) 0.25 MG  tablet Take 0.25 mg by mouth 2 (two) times daily.     amLODipine (NORVASC) 5 MG tablet Take 5 mg by mouth daily.     aspirin EC 81 MG tablet Take 81 mg by mouth at bedtime.     atorvastatin (LIPITOR) 20 MG tablet Take 20 mg by mouth at bedtime.  11   Cholecalciferol (VITAMIN D3) 2000 UNITS TABS Take 2,000 Units by mouth at bedtime.      furosemide (LASIX) 20 MG tablet Take by mouth.     gabapentin (NEURONTIN) 100 MG capsule TAKE 1 CAP EVERY MORNING, 1 CAP IN THE AFTERNOON, AND 2 CAPS EVERY DAY AT BEDTIME 360 capsule 2   metoprolol tartrate (LOPRESSOR) 25 MG tablet Take 25 mg by mouth 2 (two) times daily.     mirtazapine (REMERON) 7.5 MG tablet Take 7.5 mg by mouth at bedtime.     pantoprazole (PROTONIX) 40 MG tablet Take 40 mg by mouth 2 (two) times daily.   11   Potassium 99 MG TABS Take 99 mg by mouth at bedtime.     pravastatin (PRAVACHOL) 80 MG tablet 1 tablet     traMADol (ULTRAM) 50 MG tablet Take 50 mg by mouth 2 (two) times daily as needed for moderate pain.      vitamin B-12 (CYANOCOBALAMIN) 1000 MCG tablet Take 1 tablet by mouth daily.     No current facility-administered medications for this visit.    REVIEW OF SYSTEMS:   Constitutional: ( - ) fevers, ( - )  chills , ( - ) night sweats Eyes: ( - ) blurriness of vision, ( - ) double vision, ( - ) watery eyes Ears, nose, mouth, throat, and  face: ( - ) mucositis, ( - ) sore throat Respiratory: ( - ) cough, ( - ) dyspnea, ( - ) wheezes Cardiovascular: ( - ) palpitation, ( - ) chest discomfort, ( - ) lower extremity swelling Gastrointestinal:  ( - ) nausea, ( - ) heartburn, ( - ) change in bowel habits Skin: ( - ) abnormal skin rashes Lymphatics: ( - ) new lymphadenopathy, ( - ) easy bruising Neurological: ( - ) numbness, ( - ) tingling, ( - ) new weaknesses Behavioral/Psych: ( - ) mood change, ( - ) new changes  All other systems were reviewed with the patient and are negative.  PHYSICAL EXAMINATION: ECOG PERFORMANCE STATUS: 2 - Symptomatic, <50% confined to bed  Vitals:   07/14/21 1120  BP: (!) 134/52  Pulse: 63  Resp: 16  Temp: 97.9 F (36.6 C)  SpO2: 90%   Filed Weights   07/14/21 1120  Weight: 125 lb 3.2 oz (56.8 kg)    GENERAL: well appearing elderly Caucasian female in NAD  SKIN: skin color, texture, turgor are normal, no rashes or significant lesions ENT: erythema at right back tonsil, but no overt enlargement or concern for recurrence.  EYES: conjunctiva are pink and non-injected, sclera clear LUNGS: clear to auscultation and percussion with normal breathing effort HEART: regular rate & rhythm and no murmurs and no lower extremity edema Musculoskeletal: no cyanosis of digits and no clubbing  PSYCH: alert & oriented x 3, fluent speech NEURO: no focal motor/sensory deficits  LABORATORY DATA:  I have reviewed the data as listed    Latest Ref Rng & Units 07/14/2021   10:16 AM 04/14/2021   10:40 AM 12/24/2020   10:09 AM  CBC  WBC 4.0 - 10.5 K/uL 6.3  6.9  5.6   Hemoglobin 12.0 -  15.0 g/dL 12.3  12.3  12.4   Hematocrit 36.0 - 46.0 % 37.5  37.1  38.0   Platelets 150 - 400 K/uL 228  279  248        Latest Ref Rng & Units 07/14/2021   10:16 AM 04/14/2021   10:40 AM 12/24/2020   10:09 AM  CMP  Glucose 70 - 99 mg/dL 97  100  122   BUN 8 - 23 mg/dL '10  8  5   '$ Creatinine 0.44 - 1.00 mg/dL 0.76  0.71  0.80    Sodium 135 - 145 mmol/L 140  137  139   Potassium 3.5 - 5.1 mmol/L 3.8  3.8  3.1   Chloride 98 - 111 mmol/L 98  100  97   CO2 22 - 32 mmol/L 39  34  34   Calcium 8.9 - 10.3 mg/dL 9.5  9.3  9.2   Total Protein 6.5 - 8.1 g/dL 7.2  7.0  7.1   Total Bilirubin 0.3 - 1.2 mg/dL 0.7  1.0  0.9   Alkaline Phos 38 - 126 U/L 103  100  116   AST 15 - 41 U/L '20  20  24   '$ ALT 0 - 44 U/L '14  14  14    '$ RADIOGRAPHIC STUDIES: No results found.  ASSESSMENT & PLAN TOREY REGAN 86 y.o. female with medical history significant for Stage II DLBCL of the tonsil who presents for a follow up visit.    After review the labs, the records, discussion with the patient the findings most consistent with a stage II diffuse large B-cell lymphoma of the tonsil.  She has involvement of the right tonsil as well as axillary lymph nodes which would constitute stage II disease.  She does have some mild activity in other lymph nodes throughout the body, but nowhere near as active as the ones within her head neck.  Lymphomas involvement of these lymph nodes is not likely and therefore I would proceed with treatment as if the patient were a stage II.  Previously we discussed the diagnosis of diffuse large B-cell lymphoma and the treatment options moving forward.  The regimen most recommended for this patient would be R mini CHOP with consideration for 3 cycles followed by radiation to the local lymph nodes.  This is consistent with the treatment course to be recommended with R-CHOP chemotherapy for stage II disease.  We are still currently waiting for the results of the double hit panel, however this would not likely change management as the patient would not be able to tolerate full strength chemotherapy with something like R-EPOCH.  We also discussed the risks and benefits of this R-CHOP chemotherapy including constipation, neurotoxicity, cardiac toxicity, drop in blood counts, fatigue, nausea, vomiting, and alopecia.  The patient voiced  her understanding of these complications and wished to proceed forward with treatment.  The regimen of R- miniCHOP consists of rituximab 325 mg per metered squared, cyclophosphamide 400 mg per metered squared IV, doxorubicin 25 mg per metered squared IV, vincristine 1 mg IV, and prednisone 40 mg per metered squared p.o. on days 1 through 5.  This will be pursued for 3 cycles of chemotherapy with the addition of radiation therapy.  Based on NCCN recommendations, member institutes do practice this regimen (short course chemo + RT), though data is not robust.  # Diffuse Large B Cell Lymphoma, Stage II --complete response noted on PET CT scan on 01/08/2020. No evidence of recurrent on  CT scans from 12/27/2020.  --completed 3 cycles of R-mini-CHOP  followed by localized radiation.  -- TTE shows EF 60-65% with mild diastolic dysfunction --patient has established care with Dr. Isidore Moos and underwent radiation therapy which ended on 02/27/2020.  -- Labs today show white blood cell count 6.3, hemoglobin 12.3, MCV 99.7, and a platelet count of 228 --repeat CT scan can be performed every 6 months x 2 years, then only as clinically indicated. Next due in Dec 2023 --RTC in 6 months time with repeat CT scan   Orders Placed This Encounter  Procedures   CT Soft Tissue Neck W Contrast    Standing Status:   Future    Standing Expiration Date:   07/14/2022    Order Specific Question:   If indicated for the ordered procedure, I authorize the administration of contrast media per Radiology protocol    Answer:   Yes    Order Specific Question:   Preferred imaging location?    Answer:   [facility]   CT CHEST ABDOMEN PELVIS W CONTRAST    Standing Status:   Future    Standing Expiration Date:   07/15/2022    Order Specific Question:   Preferred imaging location?    Answer:   [facility]    Order Specific Question:   Is Oral Contrast requested for this exam?    Answer:   Yes, Per Radiology protocol    All questions were answered. The patient knows to call the clinic with any problems, questions or concerns.  A total of more than 30 minutes were spent on this encounter and over half of that time was spent on counseling and coordination of care as outlined above.   Ledell Peoples, MD Department of Hematology/Oncology [place] [facility] Phone: [phone] Pager: [phone] Email: [email or web address]  07/14/2021 5:35 PM

## 2021-10-27 ENCOUNTER — Institutional Professional Consult (permissible substitution): Payer: Medicare Other | Admitting: Emergency Medicine

## 2022-01-05 ENCOUNTER — Ambulatory Visit (HOSPITAL_COMMUNITY)
Admission: RE | Admit: 2022-01-05 | Discharge: 2022-01-05 | Disposition: A | Discharge status: Home / Self Care | Payer: Medicare Other | Source: Ambulatory Visit | Attending: Hematology and Oncology | Admitting: Hematology and Oncology

## 2022-01-05 DIAGNOSIS — Z9049 Acquired absence of other specified parts of digestive tract: Secondary | ICD-10-CM | POA: Diagnosis not present

## 2022-01-05 DIAGNOSIS — K838 Other specified diseases of biliary tract: Secondary | ICD-10-CM | POA: Insufficient documentation

## 2022-01-05 DIAGNOSIS — C8331 Diffuse large B-cell lymphoma, lymph nodes of head, face, and neck: Secondary | ICD-10-CM | POA: Insufficient documentation

## 2022-01-05 MED ORDER — SODIUM CHLORIDE (PF) 0.9 % IJ SOLN
INTRAMUSCULAR | Status: AC
  Filled 2022-01-05: qty 50

## 2022-01-05 MED ORDER — IOHEXOL 300 MG/ML  SOLN
100.0000 mL | Freq: Once | INTRAMUSCULAR | Status: AC | PRN
  Administered 2022-01-05: 100 mL via INTRAVENOUS

## 2022-01-10 ENCOUNTER — Telehealth: Payer: Self-pay

## 2022-01-10 NOTE — Telephone Encounter (Addendum)
Spoke with patient to relay message below. Patient appreciative.  ----- Message from Orson Slick, MD sent at 01/10/2022 10:28 AM EST ----- Please let Amy [name] know that her CT scan showed no evidence of residual/recurrent lymphoma. We will see her back in clinic next week.   ----- Message ----- From: Interface, Rad Results In Sent: 01/07/2022   3:48 PM EST To: Orson Slick, MD

## 2022-01-16 ENCOUNTER — Inpatient Hospital Stay: Payer: Medicare Other | Admitting: Hematology and Oncology

## 2022-01-16 ENCOUNTER — Other Ambulatory Visit: Payer: Self-pay | Admitting: Hematology and Oncology

## 2022-01-16 ENCOUNTER — Other Ambulatory Visit: Payer: Self-pay

## 2022-01-16 ENCOUNTER — Inpatient Hospital Stay: Payer: Medicare Other | Attending: Hematology and Oncology

## 2022-01-16 VITALS — BP 142/71 | HR 65 | Temp 97.8°F | Resp 16 | Wt 140.6 lb

## 2022-01-16 DIAGNOSIS — Z8572 Personal history of non-Hodgkin lymphomas: Secondary | ICD-10-CM | POA: Insufficient documentation

## 2022-01-16 DIAGNOSIS — C8331 Diffuse large B-cell lymphoma, lymph nodes of head, face, and neck: Secondary | ICD-10-CM

## 2022-01-16 DIAGNOSIS — Z87891 Personal history of nicotine dependence: Secondary | ICD-10-CM | POA: Insufficient documentation

## 2022-01-16 DIAGNOSIS — Z923 Personal history of irradiation: Secondary | ICD-10-CM | POA: Insufficient documentation

## 2022-01-16 DIAGNOSIS — Z9221 Personal history of antineoplastic chemotherapy: Secondary | ICD-10-CM | POA: Insufficient documentation

## 2022-01-16 LAB — CMP (CANCER CENTER ONLY)
ALT: 15 U/L (ref 0–44)
AST: 23 U/L (ref 15–41)
Albumin: 4.2 g/dL (ref 3.5–5.0)
Alkaline Phosphatase: 105 U/L (ref 38–126)
Anion gap: 2 — ABNORMAL LOW (ref 5–15)
BUN: 7 mg/dL — ABNORMAL LOW (ref 8–23)
CO2: 37 mmol/L — ABNORMAL HIGH (ref 22–32)
Calcium: 9.6 mg/dL (ref 8.9–10.3)
Chloride: 98 mmol/L (ref 98–111)
Creatinine: 0.67 mg/dL (ref 0.44–1.00)
GFR, Estimated: >60 mL/min (ref >60)
Glucose, Bld: 85 mg/dL (ref 70–99)
Potassium: 4 mmol/L (ref 3.5–5.1)
Sodium: 137 mmol/L (ref 135–145)
Total Bilirubin: 0.6 mg/dL (ref 0.3–1.2)
Total Protein: 6.6 g/dL (ref 6.5–8.1)

## 2022-01-16 LAB — CBC WITH DIFFERENTIAL (CANCER CENTER ONLY)
Abs Immature Granulocytes: 0.02 10*3/uL (ref 0.00–0.07)
Basophils Absolute: 0 10*3/uL (ref 0.0–0.1)
Basophils Relative: 1 %
Eosinophils Absolute: 0.2 10*3/uL (ref 0.0–0.5)
Eosinophils Relative: 2 %
HCT: 36.1 % (ref 36.0–46.0)
Hemoglobin: 12 g/dL (ref 12.0–15.0)
Immature Granulocytes: 0 %
Lymphocytes Relative: 39 %
Lymphs Abs: 3 10*3/uL (ref 0.7–4.0)
MCH: 33.2 pg (ref 26.0–34.0)
MCHC: 33.2 g/dL (ref 30.0–36.0)
MCV: 100 fL (ref 80.0–100.0)
Monocytes Absolute: 0.8 10*3/uL (ref 0.1–1.0)
Monocytes Relative: 10 %
Neutro Abs: 3.6 10*3/uL (ref 1.7–7.7)
Neutrophils Relative %: 48 %
Platelet Count: 287 10*3/uL (ref 150–400)
RBC: 3.61 MIL/uL — ABNORMAL LOW (ref 3.87–5.11)
RDW: 13.7 % (ref 11.5–15.5)
WBC Count: 7.6 10*3/uL (ref 4.0–10.5)
nRBC: 0 % (ref 0.0–0.2)

## 2022-01-16 LAB — LACTATE DEHYDROGENASE: LDH: 173 U/L (ref 98–192)

## 2022-01-16 NOTE — Progress Notes (Signed)
[place] [phone]   [phone]  PROGRESS NOTE  Patient Care Team: Aletha Halim., PA-C as PCP - General (Family Medicine) Leonie Man, MD as PCP - Cardiology (Cardiology) Eppie Gibson, MD as Consulting Physician (Radiation Oncology) Malmfelt, Stephani Police, RN as Oncology Nurse Navigator Orson Slick, MD as Consulting Physician (Hematology and Oncology)  Hematological/Oncological History # Diffuse Large B Cell Lymphoma, Stage II 1) 09/26/2019: CT scan of neck performed due to persistent tonsillar lesion after outpatient antibiotic course. CT scan showed a heterogeneous right tonsillar mass up to 3.2 cm and a small but suspicious right level 2A lymph node  2) 09/30/2019: patient underwent biopsy with ENT which revealed a DLBCL with FISH for MYC, BCL2 and BCL6 pending 3) 10/08/2019: establish care with Dr. Lorenso Courier  4) 10/21/2019: PET scan showed hypermetabolic right tonsillar mass and adjacent right level 2 adenopathy, consistent with the given history of lymphoma. 5) 11/13/2019: Cycle 1 Day 1 of R-miniCHOP 6) 12/05/2019: Cycle 2 Day 1 of R-miniCHOP 7) 12/25/2019: Cycle 3 Day 1 of R-miniCHOP 8) 02/04/2020-02/27/2020: radiation therapy.  9) 12/27/2020: CT Chest/Ab/Pelvis/Neck showed no evidence of disease recurrence/progression.  10) 01/07/2022: CT Chest/Ab/Pelvis/Neck showed no evidence of disease recurrence/progression.   Interval History:  Amy [name] 87 y.o. female with medical history significant for Stage II DLBCL of the tonsil who presents for a follow up visit. The patient's last visit was on 07/14/2021. In the interim since the last visit she has had no major changes in her health.  On exam today Amy [name] reports she had an excellent holiday season and was able to spend time with her 4 great-grandchildren.  She reports that since July 2023 she has been okay and only had some minor issues here and there.  She reports that she did  recently undergo mammogram and showed some concern in the left breast only having a repeat in 1 years time.  She notes that she is not on any new medications.  Her energy levels are good and she is able to do her day-to-day activities including shopping, cooking, and cleaning.  She reports that she "cannot do like I want to do" and that she is not able to do things like washing the windows and keep care of the house as well as she would like.  Her appetite has been good and her weight has increased as her last visit.  She notes she has not seen any bumps or lumps in any of her lymph node sites and she does check her neck on a regular basis.  Overall she is at her baseline level of health.  She currently denies any fevers, chills, sweats, nausea, or diarrhea.  She denies any palpable lymphadenopathy.  Otherwise she reports that she feels well.  A full 10 point ROS is listed below.  MEDICAL HISTORY:  Past Medical History:  Diagnosis Date   Anxiety    Arthritis    COPD (chronic obstructive pulmonary disease) ([place])    Esophageal stricture    s/p repeated dilations, Dr. Watt Climes   GERD (gastroesophageal reflux disease)    Hearing loss bilateral   has hearing aids   Heart murmur    With no gross abnormalities on echocardiogram.   Hiatal hernia    History of blood transfusion    many years ago    Hyperlipidemia    Hypothyroidism    large b cell lymphoma 04/2019   Osteoarthritis    Osteoporosis  Paroxysmal atrial fibrillation ([place])    1 confirm documented episode of A. fib; not on anticoagulation.   Pulmonary nodules    Renal lesion    1cm left kidney    SURGICAL HISTORY: Past Surgical History:  Procedure Laterality Date   ABDOMINAL HYSTERECTOMY     BREAST BIOPSY Left 10/19/2015   BREAST ENHANCEMENT SURGERY  01/10/2004   CATARACT EXTRACTION W/ INTRAOCULAR LENS  IMPLANT, BILATERAL  '09   ESOPHAGEAL DILATION     IR IMAGING GUIDED PORT INSERTION  11/05/2019   IR REMOVAL TUN ACCESS W/ PORT  W/O FL MOD SED  05/03/2020   KNEE ARTHROSCOPY  02/22/2011   Procedure: ARTHROSCOPY KNEE;  Surgeon: Gearlean Alf, MD;  Location[facility];  Service: Orthopedics;  Laterality: Right;  WITH DEBRIDEMENT    LAPAROSCOPIC SMALL BOWEL RESECTION N/A 06/02/2016   Procedure: DIAGNOSTIC LAPAROSCOPY SMALL BOWEL RESECTION;  Surgeon: Armandina Gemma, MD;  Location: [facility];  Service: General;  Laterality: N/A;   TRANSTHORACIC ECHOCARDIOGRAM  03/24/2016   a) (In setting of COPD exacerbation-pneumonia) EF 60 to 65%. ~Appear to be in A. Fib.  Unable to assess diastolic function.  Normal wall motion.  Mild LA dilation.  Trivial MR.  Elevated PAP estimated 53 mmHg, RAP 15 mm.; b) 07/29/2018: Vigorous LV function, EF> 65%.  Normal wall motion.  GR 1 DD with elevated LAP.  Normal RV size and function.  Normal valves.  Trivial MR.   TRANSTHORACIC ECHOCARDIOGRAM  10/2019   EF 60 to 65%.  Normal LV size and function.  No R WMA.  GR 1 DD.  Normal PA/RVP with normal RV.  Normal RAP.  Grossly normal aortic and mitral valves.  No significant change from prior study.   Zio Patch Monitor-back-to-back 14 days (28-day total)  12/2020   Predominantly SR: HR range 47-102 bpm, AVG 63 bpm.  Rare PACs and PVCs.  9 atrial runs: Fastest 8 beats 139 bpm, longest 9 beats 90 bpm.  No sustained arrhythmias either fast or slow.  No pauses.  Symptoms noted with sinus rhythm and occasionally with sinus rhythm associated with PACs or PVCs.    SOCIAL HISTORY: Social History   Socioeconomic History   Marital status: Widowed    Spouse name: Not on file   Number of children: 3   Years of education: 67   Highest education level: Not on file  Occupational History   Not on file  Tobacco Use   Smoking status: Former    Packs/day: 1.00    Years: 30.00    Total pack years: 30.00    Types: Cigarettes    Quit date: 01/09/1997    Years since quitting: 25.0   Smokeless tobacco: Never  Vaping Use   Vaping Use: Never used   Substance and Sexual Activity   Alcohol use: No   Drug use: No   Sexual activity: Not Currently  Other Topics Concern   Not on file  Social History Narrative   Right handed    Live alone   Caffeine use: 1 cup coffee every morning   Social Determinants of Health   Financial Resource Strain: Not on file  Food Insecurity: Not on file  Transportation Needs: Not on file  Physical Activity: Not on file  Stress: Not on file  Social Connections: Not on file  Intimate Partner Violence: Not on file    FAMILY HISTORY: Family History  Problem Relation Age of Onset   Heart disease Father    Rheum  arthritis Father    Heart failure Mother    Diabetes Brother     ALLERGIES:  is allergic to other and codeine.  MEDICATIONS:  Current Outpatient Medications  Medication Sig Dispense Refill   albuterol (VENTOLIN HFA) 108 (90 Base) MCG/ACT inhaler Inhale 2 puffs into the lungs every 4 (four) hours as needed.     ALPRAZolam (XANAX) 0.25 MG tablet Take 0.25 mg by mouth 2 (two) times daily.     amLODipine (NORVASC) 5 MG tablet Take 5 mg by mouth daily.     aspirin EC 81 MG tablet Take 81 mg by mouth at bedtime.     atorvastatin (LIPITOR) 20 MG tablet Take 20 mg by mouth at bedtime.  11   Cholecalciferol (VITAMIN D3) 2000 UNITS TABS Take 2,000 Units by mouth at bedtime.      fluticasone (FLONASE) 50 MCG/ACT nasal spray Administer 2 sprays in each nostril daily.     furosemide (LASIX) 20 MG tablet Take by mouth.     gabapentin (NEURONTIN) 100 MG capsule TAKE 1 CAP EVERY MORNING, 1 CAP IN THE AFTERNOON, AND 2 CAPS EVERY DAY AT BEDTIME 360 capsule 2   levothyroxine (SYNTHROID) 88 MCG tablet Take 1 tablet by mouth daily before breakfast.     metoprolol tartrate (LOPRESSOR) 25 MG tablet Take 25 mg by mouth 2 (two) times daily.     mirtazapine (REMERON) 7.5 MG tablet Take 7.5 mg by mouth at bedtime.     pantoprazole (PROTONIX) 40 MG tablet Take 40 mg by mouth 2 (two) times daily.   11   Potassium  99 MG TABS Take 99 mg by mouth at bedtime.     pravastatin (PRAVACHOL) 80 MG tablet 1 tablet     traMADol (ULTRAM) 50 MG tablet Take 50 mg by mouth 2 (two) times daily as needed for moderate pain.      vitamin B-12 (CYANOCOBALAMIN) 1000 MCG tablet Take 1 tablet by mouth daily.     No current facility-administered medications for this visit.    REVIEW OF SYSTEMS:   Constitutional: ( - ) fevers, ( - )  chills , ( - ) night sweats Eyes: ( - ) blurriness of vision, ( - ) double vision, ( - ) watery eyes Ears, nose, mouth, throat, and face: ( - ) mucositis, ( - ) sore throat Respiratory: ( - ) cough, ( - ) dyspnea, ( - ) wheezes Cardiovascular: ( - ) palpitation, ( - ) chest discomfort, ( - ) lower extremity swelling Gastrointestinal:  ( - ) nausea, ( - ) heartburn, ( - ) change in bowel habits Skin: ( - ) abnormal skin rashes Lymphatics: ( - ) new lymphadenopathy, ( - ) easy bruising Neurological: ( - ) numbness, ( - ) tingling, ( - ) new weaknesses Behavioral/Psych: ( - ) mood change, ( - ) new changes  All other systems were reviewed with the patient and are negative.  PHYSICAL EXAMINATION: ECOG PERFORMANCE STATUS: 2 - Symptomatic, <50% confined to bed  Vitals:   01/16/22 0956  BP: (!) 142/71  Pulse: 65  Resp: 16  Temp: 97.8 F (36.6 C)  SpO2: 93%    Filed Weights   01/16/22 0956  Weight: 140 lb 9.6 oz (63.8 kg)     GENERAL: well appearing elderly Caucasian female in NAD  SKIN: skin color, texture, turgor are normal, no rashes or significant lesions ENT: erythema at right back tonsil, but no overt enlargement or concern for recurrence.  EYES: conjunctiva  are pink and non-injected, sclera clear LUNGS: clear to auscultation and percussion with normal breathing effort HEART: regular rate & rhythm and no murmurs and no lower extremity edema Musculoskeletal: no cyanosis of digits and no clubbing  PSYCH: alert & oriented x 3, fluent speech NEURO: no focal motor/sensory  deficits  LABORATORY DATA:  I have reviewed the data as listed    Latest Ref Rng & Units 01/16/2022    9:24 AM 07/14/2021   10:16 AM 04/14/2021   10:40 AM  CBC  WBC 4.0 - 10.5 K/uL 7.6  6.3  6.9   Hemoglobin 12.0 - 15.0 g/dL 12.0  12.3  12.3   Hematocrit 36.0 - 46.0 % 36.1  37.5  37.1   Platelets 150 - 400 K/uL 287  228  279        Latest Ref Rng & Units 01/16/2022    9:24 AM 07/14/2021   10:16 AM 04/14/2021   10:40 AM  CMP  Glucose 70 - 99 mg/dL 85  97  100   BUN 8 - 23 mg/dL '7  10  8   '$ Creatinine 0.44 - 1.00 mg/dL 0.67  0.76  0.71   Sodium 135 - 145 mmol/L 137  140  137   Potassium 3.5 - 5.1 mmol/L 4.0  3.8  3.8   Chloride 98 - 111 mmol/L 98  98  100   CO2 22 - 32 mmol/L 37  39  34   Calcium 8.9 - 10.3 mg/dL 9.6  9.5  9.3   Total Protein 6.5 - 8.1 g/dL 6.6  7.2  7.0   Total Bilirubin 0.3 - 1.2 mg/dL 0.6  0.7  1.0   Alkaline Phos 38 - 126 U/L 105  103  100   AST 15 - 41 U/L '23  20  20   '$ ALT 0 - 44 U/L '15  14  14    '$ RADIOGRAPHIC STUDIES: CT Soft Tissue Neck W Contrast  Result Date: 01/08/2022 CLINICAL DATA:  87 year old female with diffuse large B-cell lymphoma. Restaging. EXAM: CT NECK WITH CONTRAST TECHNIQUE: Multidetector CT imaging of the neck was performed using the standard protocol following the bolus administration of intravenous contrast. RADIATION DOSE REDUCTION: This exam was performed according to the departmental dose-optimization program which includes automated exposure control, adjustment of the mA and/or kV according to patient size and/or use of iterative reconstruction technique. CONTRAST:  15m OMNIPAQUE IOHEXOL 300 MG/ML  SOLN COMPARISON:  Neck CT 06/10/2021. CT Chest, Abdomen, and Pelvis the same day reported separately. FINDINGS: Pharynx and larynx: Stable, negative. Parapharyngeal and retropharyngeal spaces are stable and within normal limits. Salivary glands: Stable atrophied submandibular glands, right parotid gland. Stable subtotal left parotid atrophy.  Sublingual space appears stable and within normal limits. Thyroid: Diminutive or absent. Lymph nodes: No cervical lymphadenopathy identified. Occasional diminutive cervical nodes appears stable. Vascular: Major vascular structures in the neck and at the skull base are enhancing and patent, with some generalized arterial ectasia and very mild for age atherosclerosis. Limited intracranial: Stable and negative aside from generalized intracranial artery tortuosity. Visualized orbits: Stable and negative. Mastoids and visualized paranasal sinuses: Clear. Skeleton: Chronically absent dentition. TMJ and cervical spine degeneration. No acute or suspicious osseous lesion identified. Upper chest: Reported separately. IMPRESSION: 1. Continued negative CT appearance of the Neck. No evidence of lymphoma. 2. CT Chest, Abdomen, and Pelvis the same day reported separately. Electronically Signed   By: HGenevie AnnM.D.   On: 01/08/2022 08:04   CT CHEST ABDOMEN  PELVIS W CONTRAST  Result Date: 01/07/2022 CLINICAL DATA:  87 year old female diffuse large B-cell. * Tracking Code: BO * EXAM: CT CHEST, ABDOMEN, AND PELVIS WITH CONTRAST TECHNIQUE: Multidetector CT imaging of the chest, abdomen and pelvis was performed following the standard protocol during bolus administration of intravenous contrast. RADIATION DOSE REDUCTION: This exam was performed according to the departmental dose-optimization program which includes automated exposure control, adjustment of the mA and/or kV according to patient size and/or use of iterative reconstruction technique. CONTRAST:  155m OMNIPAQUE IOHEXOL 300 MG/ML  SOLN COMPARISON:  CT 06/10/2021 FINDINGS: CT CHEST FINDINGS Cardiovascular: No significant vascular findings. Normal heart size. No pericardial effusion. Mediastinum/Nodes: Diffuse mild esophageal wall thickening is unchanged from prior. No obstructing lesion identified. No mediastinal or hilar adenopathy. No axillary adenopathy. No  supraclavicular adenopathy Lungs/Pleura: RIGHT apical pulmonary scarring unchanged. No new nodularity at the apices. No suspicious nodules in lower lobes. Musculoskeletal: No aggressive osseous lesion. Bilateral subglandular breast implants CT ABDOMEN AND PELVIS FINDINGS Hepatobiliary: Moderate intrahepatic and extrahepatic biliary duct dilatation unchanged from comparison exam. Patient status post cholecystectomy. Pancreas: Pancreas is normal. No ductal dilatation. No pancreatic inflammation. Spleen: Spleen normal volume. Adrenals/urinary tract: Adrenal glands and kidneys are normal. The ureters and bladder normal. Stomach/Bowel: Stomach and duodenum normal. There is enteric enteric anastomosis in the mid LEFT abdomen. No obstruction. Terminal ileum normal. The colon and rectosigmoid colon are normal. Multiple diverticula of the descending colon and sigmoid colon without acute inflammation. Vascular/Lymphatic: Abdominal aorta normal caliber. Venous collaterals in LEFT periaortic retroperitoneum. No lymphadenopathy in the abdomen pelvis. Reproductive: Post hysterectomy.  Adnexa unremarkable Other: No free fluid. Musculoskeletal: No aggressive osseous lesion. IMPRESSION: Chest Impression: 1. No evidence of lymphoma recurrence in the thorax. 2. Stable diffuse esophageal wall thickening. Abdomen / Pelvis Impression: 1. No lymphadenopathy in the abdomen pelvis. 2. Normal volume spleen. 3. No skeletal metastasis. 4. Stable post cholecystectomy biliary duct dilatation. Small bowel anastomosis without complication. Electronically Signed   By: SSuzy BouchardM.D.   On: 01/07/2022 15:46    ASSESSMENT & PLAN LADAYAH AROCHO89y.o. female with medical history significant for Stage II DLBCL of the tonsil who presents for a follow up visit.    After review the labs, the records, discussion with the patient the findings most consistent with a stage II diffuse large B-cell lymphoma of the tonsil.  She has involvement of the  right tonsil as well as axillary lymph nodes which would constitute stage II disease.  She does have some mild activity in other lymph nodes throughout the body, but nowhere near as active as the ones within her head neck.  Lymphomas involvement of these lymph nodes is not likely and therefore I would proceed with treatment as if the patient were a stage II.  Previously we discussed the diagnosis of diffuse large B-cell lymphoma and the treatment options moving forward.  The regimen most recommended for this patient would be R mini CHOP with consideration for 3 cycles followed by radiation to the local lymph nodes.  This is consistent with the treatment course to be recommended with R-CHOP chemotherapy for stage II disease.  We are still currently waiting for the results of the double hit panel, however this would not likely change management as the patient would not be able to tolerate full strength chemotherapy with something like R-EPOCH.  We also discussed the risks and benefits of this R-CHOP chemotherapy including constipation, neurotoxicity, cardiac toxicity, drop in blood counts, fatigue, nausea, vomiting, and  alopecia.  The patient voiced her understanding of these complications and wished to proceed forward with treatment.  The regimen of R- miniCHOP consists of rituximab 325 mg per metered squared, cyclophosphamide 400 mg per metered squared IV, doxorubicin 25 mg per metered squared IV, vincristine 1 mg IV, and prednisone 40 mg per metered squared p.o. on days 1 through 5.  This will be pursued for 3 cycles of chemotherapy with the addition of radiation therapy.  Based on NCCN recommendations, member institutes do practice this regimen (short course chemo + RT), though data is not robust.  # Diffuse Large B Cell Lymphoma, Stage II --complete response noted on PET CT scan on 01/08/2020. No evidence of recurrent on CT scans from 01/05/2022.  --completed 3 cycles of R-mini-CHOP followed by localized  radiation.  -- TTE shows EF 60-65% with mild diastolic dysfunction --patient has established care with Dr. Isidore Moos and underwent radiation therapy which ended on 02/27/2020.  -- Labs today show white blood cell count 7.6, hemoglobin 12.0, MCV 100, and platelets of 287.  Creatinine and LFTs are both within normal limits. --repeat CT scan can be performed as clinically indicated moving forward --RTC in 6 months time for repeat clinic visit.   No orders of the defined types were placed in this encounter.  All questions were answered. The patient knows to call the clinic with any problems, questions or concerns.  A total of more than 30 minutes were spent on this encounter and over half of that time was spent on counseling and coordination of care as outlined above.   Ledell Peoples, MD Department of Hematology/Oncology [place] [facility] Phone: [phone] Pager: [phone] Email: [email or web address]  01/16/2022 10:12 AM

## 2022-03-22 ENCOUNTER — Ambulatory Visit
Admission: RE | Admit: 2022-03-22 | Discharge: 2022-03-22 | Disposition: A | Discharge status: Home / Self Care | Payer: Medicare Other | Source: Ambulatory Visit | Attending: Family Medicine | Admitting: Family Medicine

## 2022-03-22 ENCOUNTER — Other Ambulatory Visit: Payer: Self-pay | Admitting: Family Medicine

## 2022-03-22 DIAGNOSIS — J189 Pneumonia, unspecified organism: Secondary | ICD-10-CM

## 2022-04-23 ENCOUNTER — Emergency Department (HOSPITAL_COMMUNITY): Payer: Medicare Other

## 2022-04-23 ENCOUNTER — Other Ambulatory Visit: Payer: Self-pay

## 2022-04-23 ENCOUNTER — Inpatient Hospital Stay (HOSPITAL_COMMUNITY)
Admission: EM | Admit: 2022-04-23 | Discharge: 2022-04-25 | DRG: 190 | Disposition: A | Discharge status: Home / Self Care | Payer: Medicare Other | Attending: Internal Medicine | Admitting: Internal Medicine

## 2022-04-23 ENCOUNTER — Encounter (HOSPITAL_COMMUNITY): Payer: Self-pay

## 2022-04-23 ENCOUNTER — Emergency Department (HOSPITAL_BASED_OUTPATIENT_CLINIC_OR_DEPARTMENT_OTHER): Payer: Medicare Other

## 2022-04-23 DIAGNOSIS — Z1152 Encounter for screening for COVID-19: Secondary | ICD-10-CM

## 2022-04-23 DIAGNOSIS — J9601 Acute respiratory failure with hypoxia: Secondary | ICD-10-CM | POA: Diagnosis not present

## 2022-04-23 DIAGNOSIS — Z79890 Hormone replacement therapy (postmenopausal): Secondary | ICD-10-CM

## 2022-04-23 DIAGNOSIS — F419 Anxiety disorder, unspecified: Secondary | ICD-10-CM | POA: Diagnosis present

## 2022-04-23 DIAGNOSIS — J441 Chronic obstructive pulmonary disease with (acute) exacerbation: Secondary | ICD-10-CM | POA: Diagnosis not present

## 2022-04-23 DIAGNOSIS — R600 Localized edema: Secondary | ICD-10-CM | POA: Diagnosis present

## 2022-04-23 DIAGNOSIS — E876 Hypokalemia: Secondary | ICD-10-CM | POA: Diagnosis not present

## 2022-04-23 DIAGNOSIS — Z888 Allergy status to other drugs, medicaments and biological substances status: Secondary | ICD-10-CM

## 2022-04-23 DIAGNOSIS — I110 Hypertensive heart disease with heart failure: Secondary | ICD-10-CM | POA: Diagnosis present

## 2022-04-23 DIAGNOSIS — Z87891 Personal history of nicotine dependence: Secondary | ICD-10-CM

## 2022-04-23 DIAGNOSIS — I10 Essential (primary) hypertension: Secondary | ICD-10-CM | POA: Diagnosis present

## 2022-04-23 DIAGNOSIS — I480 Paroxysmal atrial fibrillation: Secondary | ICD-10-CM | POA: Diagnosis present

## 2022-04-23 DIAGNOSIS — E785 Hyperlipidemia, unspecified: Secondary | ICD-10-CM | POA: Diagnosis present

## 2022-04-23 DIAGNOSIS — R0902 Hypoxemia: Secondary | ICD-10-CM

## 2022-04-23 DIAGNOSIS — Z8249 Family history of ischemic heart disease and other diseases of the circulatory system: Secondary | ICD-10-CM

## 2022-04-23 DIAGNOSIS — I5032 Chronic diastolic (congestive) heart failure: Secondary | ICD-10-CM | POA: Diagnosis present

## 2022-04-23 DIAGNOSIS — M7989 Other specified soft tissue disorders: Secondary | ICD-10-CM

## 2022-04-23 DIAGNOSIS — Z7982 Long term (current) use of aspirin: Secondary | ICD-10-CM

## 2022-04-23 DIAGNOSIS — Z885 Allergy status to narcotic agent status: Secondary | ICD-10-CM

## 2022-04-23 DIAGNOSIS — E039 Hypothyroidism, unspecified: Secondary | ICD-10-CM | POA: Diagnosis present

## 2022-04-23 DIAGNOSIS — K219 Gastro-esophageal reflux disease without esophagitis: Secondary | ICD-10-CM | POA: Diagnosis present

## 2022-04-23 DIAGNOSIS — J449 Chronic obstructive pulmonary disease, unspecified: Secondary | ICD-10-CM | POA: Diagnosis not present

## 2022-04-23 DIAGNOSIS — Z79899 Other long term (current) drug therapy: Secondary | ICD-10-CM

## 2022-04-23 LAB — RESPIRATORY PANEL BY PCR

## 2022-04-23 LAB — CBC
HCT: 37.4 % (ref 36.0–46.0)
Hemoglobin: 11.8 g/dL — ABNORMAL LOW (ref 12.0–15.0)
MCH: 32.4 pg (ref 26.0–34.0)
MCHC: 31.6 g/dL (ref 30.0–36.0)
MCV: 102.7 fL — ABNORMAL HIGH (ref 80.0–100.0)
Platelets: 300 10*3/uL (ref 150–400)
RBC: 3.64 MIL/uL — ABNORMAL LOW (ref 3.87–5.11)
RDW: 14.2 % (ref 11.5–15.5)
WBC: 8.1 10*3/uL (ref 4.0–10.5)
nRBC: 0 % (ref 0.0–0.2)

## 2022-04-23 LAB — BASIC METABOLIC PANEL
Anion gap: 11 (ref 5–15)
BUN: 10 mg/dL (ref 8–23)
CO2: 30 mmol/L (ref 22–32)
Calcium: 8.6 mg/dL — ABNORMAL LOW (ref 8.9–10.3)
Chloride: 95 mmol/L — ABNORMAL LOW (ref 98–111)
Creatinine, Ser: 0.98 mg/dL (ref 0.44–1.00)
GFR, Estimated: 57 mL/min — ABNORMAL LOW (ref >60)
Glucose, Bld: 108 mg/dL — ABNORMAL HIGH (ref 70–99)
Potassium: 3.2 mmol/L — ABNORMAL LOW (ref 3.5–5.1)
Sodium: 136 mmol/L (ref 135–145)

## 2022-04-23 LAB — MAGNESIUM: Magnesium: 1.7 mg/dL (ref 1.7–2.4)

## 2022-04-23 LAB — SARS CORONAVIRUS 2 BY RT PCR: SARS Coronavirus 2 by RT PCR: NEGATIVE

## 2022-04-23 LAB — TROPONIN I (HIGH SENSITIVITY)
Troponin I (High Sensitivity): 7 ng/L (ref <18)
Troponin I (High Sensitivity): 8 ng/L (ref <18)

## 2022-04-23 MED ORDER — PANTOPRAZOLE SODIUM 40 MG PO TBEC
40.0000 mg | DELAYED_RELEASE_TABLET | Freq: Two times a day (BID) | ORAL | Status: DC
  Administered 2022-04-23 – 2022-04-25 (×4): 40 mg via ORAL
  Filled 2022-04-23 (×4): qty 1

## 2022-04-23 MED ORDER — PANTOPRAZOLE SODIUM 40 MG IV SOLR
40.0000 mg | Freq: Once | INTRAVENOUS | Status: AC
  Administered 2022-04-23: 40 mg via INTRAVENOUS
  Filled 2022-04-23: qty 10

## 2022-04-23 MED ORDER — ALLOPURINOL 300 MG PO TABS
300.0000 mg | ORAL_TABLET | Freq: Every day | ORAL | Status: DC
  Administered 2022-04-24 – 2022-04-25 (×2): 300 mg via ORAL
  Filled 2022-04-23 (×3): qty 1

## 2022-04-23 MED ORDER — SODIUM CHLORIDE 0.9% FLUSH
3.0000 mL | Freq: Two times a day (BID) | INTRAVENOUS | Status: DC
  Administered 2022-04-23 – 2022-04-24 (×4): 3 mL via INTRAVENOUS

## 2022-04-23 MED ORDER — IPRATROPIUM-ALBUTEROL 0.5-2.5 (3) MG/3ML IN SOLN
3.0000 mL | Freq: Once | RESPIRATORY_TRACT | Status: AC
  Administered 2022-04-23: 3 mL via RESPIRATORY_TRACT
  Filled 2022-04-23: qty 3

## 2022-04-23 MED ORDER — MOMETASONE FURO-FORMOTEROL FUM 200-5 MCG/ACT IN AERO
2.0000 | INHALATION_SPRAY | Freq: Two times a day (BID) | RESPIRATORY_TRACT | Status: DC
  Administered 2022-04-23 – 2022-04-25 (×5): 2 via RESPIRATORY_TRACT
  Filled 2022-04-23: qty 8.8

## 2022-04-23 MED ORDER — ASPIRIN 81 MG PO TBEC
81.0000 mg | DELAYED_RELEASE_TABLET | Freq: Every day | ORAL | Status: DC
  Administered 2022-04-23 – 2022-04-24 (×2): 81 mg via ORAL
  Filled 2022-04-23 (×2): qty 1

## 2022-04-23 MED ORDER — SODIUM CHLORIDE 0.9 % IV SOLN: 250.0000 mL | INTRAVENOUS | Status: DC | PRN

## 2022-04-23 MED ORDER — ENOXAPARIN SODIUM 40 MG/0.4ML IJ SOSY
40.0000 mg | PREFILLED_SYRINGE | INTRAMUSCULAR | Status: DC
  Administered 2022-04-23 – 2022-04-24 (×2): 40 mg via SUBCUTANEOUS
  Filled 2022-04-23 (×2): qty 0.4

## 2022-04-23 MED ORDER — ATORVASTATIN CALCIUM 10 MG PO TABS
20.0000 mg | ORAL_TABLET | Freq: Every day | ORAL | Status: DC
  Administered 2022-04-23 – 2022-04-24 (×2): 20 mg via ORAL
  Filled 2022-04-23 (×2): qty 2

## 2022-04-23 MED ORDER — POTASSIUM CHLORIDE CRYS ER 20 MEQ PO TBCR
40.0000 meq | EXTENDED_RELEASE_TABLET | Freq: Once | ORAL | Status: AC
  Administered 2022-04-23: 40 meq via ORAL
  Filled 2022-04-23: qty 2

## 2022-04-23 MED ORDER — PREDNISONE 50 MG PO TABS
60.0000 mg | ORAL_TABLET | Freq: Every day | ORAL | Status: DC
  Administered 2022-04-24 – 2022-04-25 (×2): 60 mg via ORAL
  Filled 2022-04-23 (×2): qty 1

## 2022-04-23 MED ORDER — METHYLPREDNISOLONE SODIUM SUCC 125 MG IJ SOLR
60.0000 mg | Freq: Three times a day (TID) | INTRAMUSCULAR | Status: AC
  Administered 2022-04-23 (×2): 60 mg via INTRAVENOUS
  Filled 2022-04-23 (×2): qty 2

## 2022-04-23 MED ORDER — ONDANSETRON HCL 4 MG PO TABS: 4.0000 mg | ORAL_TABLET | Freq: Four times a day (QID) | ORAL | Status: DC | PRN

## 2022-04-23 MED ORDER — IOHEXOL 350 MG/ML SOLN
75.0000 mL | Freq: Once | INTRAVENOUS | Status: AC | PRN
  Administered 2022-04-23: 75 mL via INTRAVENOUS

## 2022-04-23 MED ORDER — ALPRAZOLAM 0.25 MG PO TABS
0.2500 mg | ORAL_TABLET | Freq: Two times a day (BID) | ORAL | Status: DC | PRN
  Administered 2022-04-25: 0.25 mg via ORAL
  Filled 2022-04-23: qty 1

## 2022-04-23 MED ORDER — ONDANSETRON HCL 4 MG/2ML IJ SOLN: 4.0000 mg | Freq: Four times a day (QID) | INTRAMUSCULAR | Status: DC | PRN

## 2022-04-23 MED ORDER — ACETAMINOPHEN 325 MG PO TABS: 650.0000 mg | ORAL_TABLET | Freq: Four times a day (QID) | ORAL | Status: DC | PRN

## 2022-04-23 MED ORDER — SODIUM CHLORIDE 0.9% FLUSH: 3.0000 mL | INTRAVENOUS | Status: DC | PRN

## 2022-04-23 MED ORDER — LEVOTHYROXINE SODIUM 88 MCG PO TABS
88.0000 ug | ORAL_TABLET | Freq: Every day | ORAL | Status: DC
  Administered 2022-04-24 – 2022-04-25 (×2): 88 ug via ORAL
  Filled 2022-04-23 (×2): qty 1

## 2022-04-23 MED ORDER — POTASSIUM CHLORIDE 20 MEQ PO PACK
40.0000 meq | PACK | Freq: Once | ORAL | Status: AC
  Administered 2022-04-23: 40 meq via ORAL
  Filled 2022-04-23: qty 2

## 2022-04-23 MED ORDER — MAGNESIUM SULFATE 2 GM/50ML IV SOLN
2.0000 g | Freq: Once | INTRAVENOUS | Status: AC
  Administered 2022-04-23: 2 g via INTRAVENOUS
  Filled 2022-04-23: qty 50

## 2022-04-23 MED ORDER — IPRATROPIUM-ALBUTEROL 0.5-2.5 (3) MG/3ML IN SOLN: 3.0000 mL | RESPIRATORY_TRACT | Status: DC | PRN

## 2022-04-23 MED ORDER — ACETAMINOPHEN 650 MG RE SUPP: 650.0000 mg | Freq: Four times a day (QID) | RECTAL | Status: DC | PRN

## 2022-04-23 NOTE — ED Notes (Signed)
US at bedside

## 2022-04-23 NOTE — ED Triage Notes (Addendum)
Pt arrives c/o chest tightness last night that has resolved. Pt was dx with PNA a few weeks ago and is concerned that it has not cleared up. Pt reports fatigue. Pt states that she had difficulty buttoning her shirt today which is new. Pt also c/o SHOB.

## 2022-04-23 NOTE — ED Provider Notes (Signed)
[place] EMERGENCY DEPARTMENT AT [facility] Provider Note   CSN: [id number] Arrival date & time: 04/23/22  1157     History  Chief Complaint  Patient presents with   Chest Pain   Shortness of Breath    Amy [name] is a 87 y.o. female.  With PMH of COPD, P A-fib, hypothyroidism who presents with shortness of breath and chest tightness.  About 3 weeks ago patient was diagnosed with pneumonia and she was given a course of steroids and antibiotics.  She completed 7 days course but reports over the past week or so she has had increasing shortness of breath on exertion.  She has had no further fevers, productive cough, congestion, rhinorrhea, vomiting or other illness symptoms.  Does endorse some intermittent chills and chest tightness however does not have any active chest pain or chest tightness now.  She has no orthopnea or PND.  Over the past 2 to 3 weeks she has developed increased swelling in her right lower extremity.  She is not on any blood thinners for A-fib.  She has no history of PE or DVT.  She is not on any oxygen at baseline.  Denies any history of bleeding intracranially or internally.   Chest Pain Associated symptoms: shortness of breath   Shortness of Breath Associated symptoms: chest pain        Home Medications Prior to Admission medications   Medication Sig Start Date End Date Taking? Authorizing Provider  Calcium Carb-Cholecalciferol (OYSTER SHELL CALCIUM W/D) 500-5 MG-MCG TABS Take 1 tablet by mouth daily. 05/10/15  Yes [provider]  mupirocin ointment (BACTROBAN) 2 % APPLY TO AFFECTED AREA TWICE A DAY 02/01/22  Yes [provider]  neomycin-polymyxin b-dexamethasone (MAXITROL) 3.5-10000-0.1 SUSP SMARTSIG:In Eye(s) 04/20/22  Yes [provider]  albuterol (VENTOLIN HFA) 108 (90 Base) MCG/ACT inhaler Inhale 2 puffs into the lungs every 4 (four) hours as needed. 11/11/20   [provider]  allopurinol (ZYLOPRIM) 300 MG  tablet Take 300 mg by mouth daily.    [provider]  ALPRAZolam Prudy Feeler) 0.25 MG tablet Take 0.25 mg by mouth 2 (two) times daily.    [provider]  amLODipine (NORVASC) 5 MG tablet Take 5 mg by mouth daily. 09/12/19   [provider]  aspirin EC 81 MG tablet Take 81 mg by mouth at bedtime.    [provider]  atorvastatin (LIPITOR) 20 MG tablet Take 20 mg by mouth at bedtime.    [provider]  Cholecalciferol (VITAMIN D3) 2000 UNITS TABS Take 2,000 Units by mouth at bedtime.     [provider]  fluticasone (FLONASE) 50 MCG/ACT nasal spray Administer 2 sprays in each nostril daily. 04/13/21   [provider]  furosemide (LASIX) 20 MG tablet Take by mouth. 08/23/20   [provider]  gabapentin (NEURONTIN) 100 MG capsule TAKE 1 CAP EVERY MORNING, 1 CAP IN THE AFTERNOON, AND 2 CAPS EVERY DAY AT BEDTIME 04/15/18   Sater, Pearletha Furl, MD  levothyroxine (SYNTHROID) 88 MCG tablet Take 1 tablet by mouth daily before breakfast. 03/01/21   [provider]  metoprolol tartrate (LOPRESSOR) 25 MG tablet Take 25 mg by mouth 2 (two) times daily.    [provider]  mirtazapine (REMERON) 7.5 MG tablet Take 7.5 mg by mouth at bedtime. 06/23/21   [provider]  pantoprazole (PROTONIX) 40 MG tablet Take 40 mg by mouth 2 (two) times daily.     [provider]  Potassium 99 MG TABS Take 99 mg by mouth at bedtime.    [provider]  pravastatin (PRAVACHOL) 80 MG tablet 1 tablet    [provider]  traMADol (ULTRAM) 50 MG tablet Take 50 mg by mouth 2 (two) times daily as needed for moderate pain.     [provider]  vitamin B-12 (CYANOCOBALAMIN) 1000 MCG tablet Take 1 tablet by mouth daily.    [provider]      Allergies    Other and Codeine    Review of Systems   Review of Systems  Respiratory:  Positive for shortness of breath.   Cardiovascular:  Positive for chest  pain.    Physical Exam Updated Vital Signs BP (!) 114/53   Pulse 82   Temp 98.3 F (36.8 C) (Oral)   Resp 19   Ht  (1.651 m)   Wt 63.5 kg   SpO2 97%   BMI 23.30 kg/m  Physical Exam Constitutional: Alert and oriented. Well appearing and in no distress. Eyes: Conjunctivae are normal. ENT      Head: Normocephalic and atraumatic. Cardiovascular: S1, S2,  Normal and symmetric distal pulses are present in all extremities.Warm and well perfused. Respiratory: Normal respiratory effort.  Faint end expiratory wheeze over right mid lobe otherwise all breath sounds clear, no further crackling, no further wheezing, no increased work of breathing Gastrointestinal: Soft and nontender.  Musculoskeletal: Normal range of motion in all extremities.      Right lower leg: Nontender, asymmetric erythematous pitting edema 2+ extending from foot to knee      Left lower leg: No tenderness or edema. Neurologic: Normal speech and language.  No facial droop.  Moving all extremities equally.  Sensation grossly intact.  No gross focal neurologic deficits are appreciated. Skin: Skin is warm, dry and intact. No rash noted. Psychiatric: Mood and affect are normal. Speech and behavior are normal.   ED Results / Procedures / Treatments   Labs (all labs ordered are listed, but only abnormal results are displayed) Labs Reviewed  BASIC METABOLIC PANEL - Abnormal; Notable for the following components:      Result Value   Potassium 3.2 (*)    Chloride 95 (*)    Glucose, Bld 108 (*)    Calcium 8.6 (*)    GFR, Estimated 57 (*)    All other components within normal limits  CBC - Abnormal; Notable for the following components:   RBC 3.64 (*)    Hemoglobin 11.8 (*)    MCV 102.7 (*)    All other components within normal limits  MAGNESIUM  TROPONIN I (HIGH SENSITIVITY)  TROPONIN I (HIGH SENSITIVITY)    EKG EKG Interpretation  Date/Time:  Sunday April 23 2022 08:09:41 EDT Ventricular Rate:  86 PR  Interval:  168 QRS Duration: 101 QT Interval:  374 QTC Calculation: 448 R Axis:   63 Text Interpretation: Sinus rhythm Probable left atrial enlargement Confirmed by Amy [name] ([id number]) on 04/23/2022 8:19:57 AM  Radiology CT Angio Chest PE W and/or Wo Contrast  Result Date: 04/23/2022 CLINICAL DATA:  Chest pain. EXAM: CT ANGIOGRAPHY CHEST WITH CONTRAST TECHNIQUE: Multidetector CT imaging of the chest was performed using the standard protocol during bolus administration of intravenous contrast. Multiplanar CT image reconstructions and MIPs were obtained to evaluate the vascular anatomy. RADIATION DOSE REDUCTION: This exam was performed according to the departmental dose-optimization program which includes automated exposure control, adjustment of the mA and/or kV according to patient  size and/or use of iterative reconstruction technique. CONTRAST:  65mL OMNIPAQUE IOHEXOL 350 MG/ML SOLN COMPARISON:  January 05, 2022. FINDINGS: Cardiovascular: Satisfactory opacification of the pulmonary arteries to the segmental level. No evidence of pulmonary embolism. Mild cardiomegaly. No pericardial effusion. Mediastinum/Nodes: There remains significant wall thickening involving the distal esophagus concerning for esophagitis. Thyroid gland is not well visualized. No significant adenopathy is noted. Lungs/Pleura: No pneumothorax or pleural effusion is noted. Mild biapical scarring is noted. Minimal right posterior basilar subsegmental atelectasis is noted. Upper Abdomen: No acute abnormality. Musculoskeletal: No chest wall abnormality. No acute or significant osseous findings. Review of the MIP images confirms the above findings. IMPRESSION: No definite evidence of pulmonary embolus. Stable distal esophageal wall thickening concerning for esophagitis. Endoscopy may be performed for further evaluation. Minimal right posterior basilar subsegmental atelectasis. Aortic Atherosclerosis (ICD10-I70.0). Electronically Signed    By: Lupita Raider M.D.   On: 04/23/2022 10:55   DG Chest 2 View  Result Date: 04/23/2022 CLINICAL DATA:  Chest pain last night.  Dry cough. EXAM: CHEST - 2 VIEW COMPARISON:  Chest x-rays dated 03/22/2022 and 06/23/2020 FINDINGS: Borderline cardiomegaly. Lungs are clear. No pleural effusion or pneumothorax is seen. Osseous structures about the chest are unremarkable. IMPRESSION: No active cardiopulmonary disease. No evidence of pneumonia or pulmonary edema. Electronically Signed   By: Bary Richard M.D.   On: 04/23/2022 09:11    Procedures .Critical Care  Performed by: Mardene Sayer, MD Authorized by: Mardene Sayer, MD   Critical care provider statement:    Critical care time (minutes):  35   Critical care was necessary to treat or prevent imminent or life-threatening deterioration of the following conditions:  Respiratory failure   Critical care was time spent personally by me on the following activities:  Development of treatment plan with patient or surrogate, evaluation of patient's response to treatment, examination of patient, ordering and review of laboratory studies, ordering and review of radiographic studies, ordering and performing treatments and interventions, pulse oximetry, re-evaluation of patient's condition, review of old charts and obtaining history from patient or surrogate   Care discussed with: admitting provider       Medications Ordered in ED Medications  potassium chloride (KLOR-CON) packet 40 mEq (40 mEq Oral Given 04/23/22 0938)  iohexol (OMNIPAQUE) 350 MG/ML injection 75 mL (75 mLs Intravenous Contrast Given 04/23/22 1037)  ipratropium-albuterol (DUONEB) 0.5-2.5 (3) MG/3ML nebulizer solution 3 mL (3 mLs Nebulization Given 04/23/22 1126)  pantoprazole (PROTONIX) injection 40 mg (40 mg Intravenous Given 04/23/22 1128)    ED Course/ Medical Decision Making/ A&P Clinical Course as of 04/23/22 1130  Sun Apr 23, 2022  1127 Discussed with on-call  hospitalist will admit patient for observation for new hypoxia.  Unclear etiology consider possible insult from previous pneumonia and now chronic requirement of oxygen especially with underlying COPD although no signs of COPD exacerbation on exam.  She will be admitted for observation, reassessment and DVT studies.  Updated the patient and patient's daughter in the room. [VB]    Clinical Course User Index [VB] Mardene Sayer, MD                             Medical Decision Making DENITRA CIAK is a 87 y.o. female.  With PMH of COPD, P A-fib, hypothyroidism who presents with shortness of breath and chest tightness.  Patient presents hypoxic 80% on room air but otherwise hemodynamically stable.  She  is not on oxygen at baseline and improved to 98% on 3 L nasal cannula with no significant increased work of breathing.  She has no severe wheezing or crackles on exam suggestive of COPD exacerbation.  Consider possible viral illness, bacterial pneumonia, or possible PE especially with symptoms suggestive of DVT on right lower extremity on exam and not being anticoagulated.  She has no active chest pain on exam and EKG reviewed by me unremarkable and unchanged from prior.  She has reassuring high sensitive troponin 7, doubt ACS.  Labs further reviewed by me with normal white blood cell count 8.1, mild macrocytic anemia hemoglobin 11.8, creatinine 0.98 within normal limits.  Mild hypokalemia 3.2 ordered for p.o. repletion.  Chest x-ray obtained which I personally reviewed no evidence of pneumonia, no pneumothorax, no pleural effusions.  This was followed by CTA PE study which showed no evidence of acute PE.    Discussed with on-call hospitalist will admit patient for observation for new hypoxia.  Unclear etiology consider possible insult from previous pneumonia and now chronic requirement of oxygen especially with underlying COPD although no signs of COPD exacerbation on exam.  She will be admitted  for observation, reassessment and DVT studies.  Updated the patient and patient's daughter in the room. [VB]  Amount and/or Complexity of Data Reviewed Labs: ordered. Radiology: ordered.  Risk Prescription drug management. Decision regarding hospitalization.      Final Clinical Impression(s) / ED Diagnoses Final diagnoses:  Hypoxia  Hypokalemia    Rx / DC Orders ED Discharge Orders     None         Mardene Sayer, MD 04/23/22 1130

## 2022-04-23 NOTE — ED Notes (Signed)
Called dietary to deliver meal tray to room 1540

## 2022-04-23 NOTE — H&P (Signed)
[facility] History and Physical  TRAN FARISH NID:[id number] DOB: Jan 13, 1935 DOA: 04/23/2022   PCP: Richmond Campbell., PA-C  Specialists: None  Chief Complaint: Shortness of breath  HPI: Amy [name] is a 87 y.o. female with a past medical history of COPD, former smoker, essential hypertension, hyperlipidemia, hypothyroidism who was in her usual state of health till about 3 weeks ago when she was diagnosed with pneumonia.  She was prescribed a course of antibiotics and steroids.  She has completed the course.  However she has been very slow to recover.  She has had increasing shortness of breath last week or so.  Has had some swelling in her legs.  She had some chest tightness yesterday but none this morning.  Since her symptoms were getting worse she decided to come into the emergency department.  Denies any chest pain currently.  No nausea or vomiting in the last 2 days.  She did have an episode of emesis on Wednesday night after she ate out in a restaurant.  But since then she has not had any recurrence of her symptoms.  Denies any fever or chills.  No sick exposure.  In the emergency department she underwent CT angiogram which was negative for PE.  No pneumonia was noted.  No pulmonary edema.  She was saturating 80% on room air initially.  Improved to the 90s after she was placed on oxygen by nasal cannula.  Home Medications: This list is not reconciled yet. Prior to Admission medications   Medication Sig Start Date End Date Taking? Authorizing Provider  Calcium Carb-Cholecalciferol (OYSTER SHELL CALCIUM W/D) 500-5 MG-MCG TABS Take 1 tablet by mouth daily. 05/10/15  Yes [provider]  mupirocin ointment (BACTROBAN) 2 % APPLY TO AFFECTED AREA TWICE A DAY 02/01/22  Yes [provider]  neomycin-polymyxin b-dexamethasone (MAXITROL) 3.5-10000-0.1 SUSP SMARTSIG:In Eye(s) 04/20/22  Yes [provider]  albuterol (VENTOLIN HFA) 108 (90 Base) MCG/ACT inhaler Inhale 2  puffs into the lungs every 4 (four) hours as needed. 11/11/20   [provider]  allopurinol (ZYLOPRIM) 300 MG tablet Take 300 mg by mouth daily.    [provider]  ALPRAZolam Prudy Feeler) 0.25 MG tablet Take 0.25 mg by mouth 2 (two) times daily.    [provider]  amLODipine (NORVASC) 5 MG tablet Take 5 mg by mouth daily. 09/12/19   [provider]  aspirin EC 81 MG tablet Take 81 mg by mouth at bedtime.    [provider]  atorvastatin (LIPITOR) 20 MG tablet Take 20 mg by mouth at bedtime.    [provider]  Cholecalciferol (VITAMIN D3) 2000 UNITS TABS Take 2,000 Units by mouth at bedtime.     [provider]  fluticasone (FLONASE) 50 MCG/ACT nasal spray Administer 2 sprays in each nostril daily. 04/13/21   [provider]  furosemide (LASIX) 20 MG tablet Take by mouth. 08/23/20   [provider]  gabapentin (NEURONTIN) 100 MG capsule TAKE 1 CAP EVERY MORNING, 1 CAP IN THE AFTERNOON, AND 2 CAPS EVERY DAY AT BEDTIME 04/15/18   Sater, Pearletha Furl, MD  levothyroxine (SYNTHROID) 88 MCG tablet Take 1 tablet by mouth daily before breakfast. 03/01/21   [provider]  metoprolol tartrate (LOPRESSOR) 25 MG tablet Take 25 mg by mouth 2 (two) times daily.    [provider]  mirtazapine (REMERON) 7.5 MG tablet Take 7.5 mg by mouth at bedtime. 06/23/21   [provider]  pantoprazole (PROTONIX) 40 MG  tablet Take 40 mg by mouth 2 (two) times daily.     [provider]  Potassium 99 MG TABS Take 99 mg by mouth at bedtime.    [provider]  pravastatin (PRAVACHOL) 80 MG tablet 1 tablet    [provider]  traMADol (ULTRAM) 50 MG tablet Take 50 mg by mouth 2 (two) times daily as needed for moderate pain.     [provider]  vitamin B-12 (CYANOCOBALAMIN) 1000 MCG tablet Take 1 tablet by mouth daily.    [provider]    Allergies:  Allergies  Allergen Reactions    Other Anaphylaxis    Swelling in throat Bee Sting   Codeine Other (See Comments)    Reaction:  Hallucinations Other reaction(s): Confusion    Past Medical History: Past Medical History:  Diagnosis Date   Anxiety    Arthritis    COPD (chronic obstructive pulmonary disease)    Esophageal stricture    s/p repeated dilations, Dr. Ewing Schlein   GERD (gastroesophageal reflux disease)    Hearing loss bilateral   has hearing aids   Heart murmur    With no gross abnormalities on echocardiogram.   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 fib; not on anticoagulation.   Pulmonary nodules    Renal lesion    1cm left kidney    Past Surgical History:  Procedure Laterality Date   ABDOMINAL HYSTERECTOMY     BREAST BIOPSY Left 10/19/2015   BREAST ENHANCEMENT SURGERY  01/10/2004   CATARACT EXTRACTION W/ INTRAOCULAR LENS  IMPLANT, BILATERAL  '09   ESOPHAGEAL DILATION     IR IMAGING GUIDED PORT INSERTION  11/05/2019   IR REMOVAL TUN ACCESS W/ PORT W/O FL MOD SED  05/03/2020   KNEE ARTHROSCOPY  02/22/2011   Procedure: ARTHROSCOPY KNEE;  Surgeon: Loanne Drilling, MD;  Location: [facility];  Service: Orthopedics;  Laterality: Right;  WITH DEBRIDEMENT    LAPAROSCOPIC SMALL BOWEL RESECTION N/A 06/02/2016   Procedure: DIAGNOSTIC LAPAROSCOPY SMALL BOWEL RESECTION;  Surgeon: Darnell Level, MD;  Location: [facility];  Service: General;  Laterality: N/A;   TRANSTHORACIC ECHOCARDIOGRAM  03/24/2016   a) (In setting of COPD exacerbation-pneumonia) EF 60 to 65%. ~Appear to be in A. Fib.  Unable to assess diastolic function.  Normal wall motion.  Mild LA dilation.  Trivial MR.  Elevated PAP estimated 53 mmHg, RAP 15 mm.; b) 07/29/2018: Vigorous LV function, EF> 65%.  Normal wall motion.  GR 1 DD with  elevated LAP.  Normal RV size and function.  Normal valves.  Trivial MR.   TRANSTHORACIC ECHOCARDIOGRAM  10/2019   EF 60 to 65%.  Normal LV size and function.  No R WMA.  GR 1 DD.  Normal PA/RVP with normal RV.  Normal RAP.  Grossly normal aortic and mitral valves.  No significant change from prior study.   Zio Patch Monitor-back-to-back 14 days (28-day total)  12/2020   Predominantly SR: HR range 47-102 bpm, AVG 63 bpm.  Rare PACs and PVCs.  9 atrial runs: Fastest 8 beats 139 bpm, longest 9 beats 90 bpm.  No sustained arrhythmias either fast or slow.  No pauses.  Symptoms noted with sinus rhythm and occasionally  with sinus rhythm associated with PACs or PVCs.    Social History: Lives in [place].  Former smoker.  No alcohol use.   Family History:  Family History  Problem Relation Age of Onset   Heart disease Father    Rheum arthritis Father    Heart failure Mother    Diabetes Brother      Review of Systems - History obtained from the patient General ROS: positive for  - fatigue Psychological ROS: positive for - anxiety Ophthalmic ROS: negative ENT ROS: negative Allergy and Immunology ROS: negative Hematological and Lymphatic ROS: negative Endocrine ROS: negative Respiratory ROS: As in HPI Cardiovascular ROS: As in HPI Gastrointestinal ROS: no abdominal pain, change in bowel habits, or black or bloody stools Genito-Urinary ROS: no dysuria, trouble voiding, or hematuria Musculoskeletal ROS: negative Neurological ROS: no TIA or stroke symptoms Dermatological ROS: negative  Physical Examination  Vitals:   04/23/22 0940 04/23/22 1000 04/23/22 1125 04/23/22 1130  BP: 126/62 (!) 114/53  (!) 110/45  Pulse: 83 82  84  Resp: 16 19  11   Temp:   98.3 F (36.8 C)   TempSrc:   Oral   SpO2: 97% 97%  100%  Weight:      Height:        BP (!) 110/45 Comment: 3L [place]  Pulse 84 Comment: 3L [place]  Temp 98.3 F (36.8 C) (Oral)   Resp 11 Comment: 3L [place]  Ht 5\' 5"  (1.651 m)   Wt 63.5 kg    SpO2 100% Comment: 3L [place]  BMI 23.30 kg/m   General appearance: alert, cooperative, appears stated age, and no distress Head: Normocephalic, without obvious abnormality, atraumatic Eyes: conjunctivae/corneas clear. PERRL, EOM's intact.  Neck: no adenopathy, no carotid bruit, no JVD, supple, symmetrical, trachea midline, and thyroid not enlarged, symmetric, no tenderness/mass/nodules Resp: Mildly tachypneic.  Coarse breath sounds bilaterally.  No crackles or wheezing appreciated.  No rhonchi. Cardio: regular rate and rhythm, S1, S2 normal, no murmur, click, rub or gallop GI: soft, non-tender; bowel sounds normal; no masses,  no organomegaly Extremities: Mild pedal edema right greater than left. Pulses: 2+ and symmetric Skin: Skin color, texture, turgor normal. No rashes or lesions Lymph nodes: Cervical, supraclavicular, and axillary nodes normal. Neurologic: Alert and oriented x 3.  No focal neurological deficits noted.   Labs on Admission: I have personally reviewed following labs and imaging studies  CBC: Recent Labs  Lab 04/23/22 0813  WBC 8.1  HGB 11.8*  HCT 37.4  MCV 102.7*  PLT 300   Basic Metabolic Panel: Recent Labs  Lab 04/23/22 0813  NA 136  K 3.2*  CL 95*  CO2 30  GLUCOSE 108*  BUN 10  CREATININE 0.98  CALCIUM 8.6*  MG 1.7   GFR: Estimated Creatinine Clearance: 37.8 mL/min (by C-G formula based on SCr of 0.98 mg/dL).    Radiological Exams on Admission: CT Angio Chest PE W and/or Wo Contrast  Result Date: 04/23/2022 CLINICAL DATA:  Chest pain. EXAM: CT ANGIOGRAPHY CHEST WITH CONTRAST TECHNIQUE: Multidetector CT imaging of the chest was performed using the standard protocol during bolus administration of intravenous contrast. Multiplanar CT image reconstructions and MIPs were obtained to evaluate the vascular anatomy. RADIATION DOSE REDUCTION: This exam was performed according to the departmental dose-optimization program which includes automated exposure  control, adjustment of the mA and/or kV according to patient size and/or use of iterative reconstruction technique. CONTRAST:  48mRe(807) [id number] Pulling IOHEXOL 350 MG/ML SOLN COMPARISON:  January 05, 2022. FINDINGS:  Cardiovascular: Satisfactory opacification of the pulmonary arteries to the segmental level. No evidence of pulmonary embolism. Mild cardiomegaly. No pericardial effusion. Mediastinum/Nodes: There remains significant wall thickening involving the distal esophagus concerning for esophagitis. Thyroid gland is not well visualized. No significant adenopathy is noted. Lungs/Pleura: No pneumothorax or pleural effusion is noted. Mild biapical scarring is noted. Minimal right posterior basilar subsegmental atelectasis is noted. Upper Abdomen: No acute abnormality. Musculoskeletal: No chest wall abnormality. No acute or significant osseous findings. Review of the MIP images confirms the above findings. IMPRESSION: No definite evidence of pulmonary embolus. Stable distal esophageal wall thickening concerning for esophagitis. Endoscopy may be performed for further evaluation. Minimal right posterior basilar subsegmental atelectasis. Aortic Atherosclerosis (ICD10-I70.0). Electronically Signed   By: Lupita Raider M.D.   On: 04/23/2022 10:55   DG Chest 2 View  Result Date: 04/23/2022 CLINICAL DATA:  Chest pain last night.  Dry cough. EXAM: CHEST - 2 VIEW COMPARISON:  Chest x-rays dated 03/22/2022 and 06/23/2020 FINDINGS: Borderline cardiomegaly. Lungs are clear. No pleural effusion or pneumothorax is seen. Osseous structures about the chest are unremarkable. IMPRESSION: No active cardiopulmonary disease. No evidence of pneumonia or pulmonary edema. Electronically Signed   By: Bary Richard M.D.   On: 04/23/2022 09:11    My interpretation of Electrocardiogram: Sinus rhythm in the 80s.  Normal axis.  Intervals are normal.  No concerning ST segment or T wave changes.   Problem List  Principal Problem:   Acute  respiratory failure with hypoxia Active Problems:   Essential hypertension   COPD GOLD III   GERD (gastroesophageal reflux disease)   Hypothyroidism   Hyperlipidemia   Pedal edema   Assessment: This is a 87 year old female with past medical history as stated earlier who was diagnosed with pneumonia about 3 weeks ago.  She has completed course of antibiotics and steroids.  Presented with 1 week history of worsening shortness of breath.  Differential diagnosis includes COPD exacerbation.  PE was ruled out by CT angiogram.  CHF seems less likely considering that there is no pulm edema on imaging studies.  However she does have pedal edema.  Acute viral illness is also a possibility though she is afebrile.  Plan:  #1. Acute respiratory failure with hypoxia: Saturations noted to be 80% on room air.  Improved to 90s on oxygen by nasal cannula.  Will place her on steroids.  Give nebulizer treatments and inhalers.  Will perform COVID-19 and respiratory viral panel.  Last echocardiogram is from 2021 which showed normal left ventricular systolic function.  Diastolic dysfunction was noted.  Will repeat echo.  Continue oxygen.  Hopefully will be able to wean her off of it by tomorrow.  WBC is normal.  No clear indication to initiate antibacterials.  #2. Chest tightness: Likely related to above.  Troponin is normal.  EKG without any ischemic changes.  CT angiogram negative for PE.  Follow-up on echocardiogram.  #3. Pedal edema: The right greater than left.  Lower extremity Doppler studies will be ordered.  #4. History of COPD: Please see above for details.  Steroids and inhalers will be utilized.  #5. Essential hypertension: Blood pressure borderline low.  Hold her antihypertensives.  #6. Hypokalemia: Will be repleted.  Magnesium 1.7.  #7. Macrocytic anemia: Outpatient management.  #8. Hypothyroidism: Continue levothyroxine.  #9. Fatigue: Likely due to her ongoing respiratory issues.  PT and OT  evaluation.  #10. Esophageal wall thickening: Noted incidentally on CT angiogram.  Continue PPI.  May benefit from  endoscopy in the outpatient setting.  Will defer to primary care providers.  DVT Prophylaxis: Lovenox Code Status: Full code Family Communication: Discussed with patient and her daughter Disposition: Hopefully return home when improved Consults called: None Admission Status: Status is: Observation The patient remains OBS appropriate and will d/c before 2 midnights.    Severity of Illness: The appropriate patient status for this patient is OBSERVATION. Observation status is judged to be reasonable and necessary in order to provide the required intensity of service to ensure the patient's safety. The patient's presenting symptoms, physical exam findings, and initial radiographic and laboratory data in the context of their medical condition is felt to place them at decreased risk for further clinical deterioration. Furthermore, it is anticipated that the patient will be medically stable for discharge from the hospital within 2 midnights of admission.    Further management decisions will depend on results of further testing and patient's response to treatment.   Amy [name] [facility]  [facility] [employer] on [email or web address]  04/23/2022, 11:51 AM

## 2022-04-23 NOTE — Progress Notes (Signed)
Bilateral lower extremity venous study completed.   Preliminary results relayed to MD and RN.  Please see CV Procedures for preliminary results.  Jersie Beel, RVT  12:20 PM 04/23/22]

## 2022-04-23 NOTE — ED Notes (Signed)
ED TO INPATIENT HANDOFF REPORT  ED Nurse Name and Phone #: [phone]  S Name/Age/Gender Amy [name] 87 y.o. female Room/Bed: WA16/WA16  Code Status   Code Status: Full Code  Home/SNF/Other Home Patient oriented to: self, place, time, and situation Is this baseline? Yes   Triage Complete: Triage complete  Chief Complaint Acute respiratory failure with hypoxia [J96.01]  Triage Note Pt arrives c/o chest tightness last night that has resolved. Pt was dx with PNA a few weeks ago and is concerned that it has not cleared up. Pt reports fatigue. Pt states that she had difficulty buttoning her shirt today which is new. Pt also c/o SHOB.   Allergies Allergies  Allergen Reactions   Other Anaphylaxis    Swelling in throat Bee Sting   Codeine Other (See Comments)    Reaction:  Hallucinations Other reaction(s): Confusion    Level of Care/Admitting Diagnosis ED Disposition     ED Disposition  Admit   Condition  --   Comment  Hospital Area: [facility] [[id number]]  Level of Care: Med-Surg [16]  May place patient in observation at [facility] or [facility] if equivalent level of care is available:: No  Covid Evaluation: Symptomatic Person Under Investigation (PUI) or recent exposure (last 10 days) *Testing Required*  Diagnosis: Acute respiratory failure with hypoxia [147829]  Admitting Physician: Osvaldo Shipper [[id number]]  Attending Physician: Osvaldo Shipper [[id number]]          B Medical/Surgery History Past Medical History:  Diagnosis Date   Anxiety    Arthritis    COPD (chronic obstructive pulmonary disease)    Esophageal stricture    s/p repeated dilations, Dr. Ewing Schlein   GERD (gastroesophageal reflux disease)    Hearing loss bilateral   has hearing aids   Heart murmur    With no gross abnormalities on echocardiogram.   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 fib; not on anticoagulation.   Pulmonary nodules    Renal lesion    1cm left kidney   Past Surgical History:  Procedure Laterality Date   ABDOMINAL HYSTERECTOMY     BREAST BIOPSY Left 10/19/2015   BREAST ENHANCEMENT SURGERY  01/10/2004   CATARACT EXTRACTION W/ INTRAOCULAR LENS  IMPLANT, BILATERAL  '09   ESOPHAGEAL DILATION     IR IMAGING GUIDED PORT INSERTION  11/05/2019   IR REMOVAL TUN ACCESS W/ PORT W/O FL MOD SED  05/03/2020   KNEE ARTHROSCOPY  02/22/2011   Procedure: ARTHROSCOPY KNEE;  Surgeon: Loanne Drilling, MD;  Location: [facility];  Service: Orthopedics;  Laterality: Right;  WITH DEBRIDEMENT    LAPAROSCOPIC SMALL BOWEL RESECTION N/A 06/02/2016   Procedure: DIAGNOSTIC LAPAROSCOPY SMALL BOWEL RESECTION;  Surgeon: Darnell Level, MD;  Location: [facility];  Service: General;  Laterality: N/A;   TRANSTHORACIC ECHOCARDIOGRAM  03/24/2016   a) (In setting of COPD exacerbation-pneumonia) EF 60 to 65%. ~Appear to be in A. Fib.  Unable to assess diastolic function.  Normal wall motion.  Mild LA dilation.  Trivial MR.  Elevated PAP estimated 53 mmHg, RAP 15 mm.; b) 07/29/2018: Vigorous LV function, EF> 65%.  Normal wall motion.  GR 1 DD with elevated LAP.  Normal RV size and  function.  Normal valves.  Trivial MR.   TRANSTHORACIC ECHOCARDIOGRAM  10/2019   EF 60 to 65%.  Normal LV size and function.  No R WMA.  GR 1 DD.  Normal PA/RVP with normal RV.  Normal RAP.  Grossly normal aortic and mitral valves.  No significant change from prior study.   Zio Patch Monitor-back-to-back 14 days (28-day total)  12/2020   Predominantly SR: HR range 47-102 bpm, AVG 63 bpm.  Rare PACs and PVCs.  9 atrial runs: Fastest 8 beats 139 bpm, longest 9 beats 90 bpm.  No sustained arrhythmias either fast or slow.  No pauses.  Symptoms noted with sinus rhythm and occasionally with sinus rhythm  associated with PACs or PVCs.     A IV Location/Drains/Wounds Patient Lines/Drains/Airways Status     Active Line/Drains/Airways     Name Placement date Placement time Site Days   Peripheral IV 04/23/22 20 G Left Antecubital 04/23/22  0811  Antecubital  less than 1   External Urinary Catheter 07/28/18  1940  --  1365   Incision - 1 Port Abdomen 1: Left;Upper 06/02/16  0800  -- 2151            Intake/Output Last 24 hours No intake or output data in the 24 hours ending 04/23/22 1229  Labs/Imaging Results for orders placed or performed during the hospital encounter of 04/23/22 (from the past 48 hour(s))  Basic metabolic panel     Status: Abnormal   Collection Time: 04/23/22  8:13 AM  Result Value Ref Range   Sodium 136 135 - 145 mmol/L   Potassium 3.2 (L) 3.5 - 5.1 mmol/L   Chloride 95 (L) 98 - 111 mmol/L   CO2 30 22 - 32 mmol/L   Glucose, Bld 108 (H) 70 - 99 mg/dL    Comment: Glucose reference range applies only to samples taken after fasting for at least 8 hours.   BUN 10 8 - 23 mg/dL   Creatinine, Ser 2.13 0.44 - 1.00 mg/dL   Calcium 8.6 (L) 8.9 - 10.3 mg/dL   GFR, Estimated 57 (L) >60 mL/min    Comment: (NOTE) Calculated using the CKD-EPI Creatinine Equation (2021)    Anion gap 11 5 - 15    Comment: Performed at [facility], [address] [place], [place] [address]  CBC     Status: Abnormal   Collection Time: 04/23/22  8:13 AM  Result Value Ref Range   WBC 8.1 4.0 - 10.5 K/uL   RBC 3.64 (L) 3.87 - 5.11 MIL/uL   Hemoglobin 11.8 (L) 12.0 - 15.0 g/dL   HCT 84.6 96.2 - 95.2 %   MCV 102.7 (H) 80.0 - 100.0 fL   MCH 32.4 26.0 - 34.0 pg   MCHC 31.6 30.0 - 36.0 g/dL   RDW 84.1 32.4 - 40.1 %   Platelets 300 150 - 400 K/uL   nRBC 0.0 0.0 - 0.2 %    Comment: Performed at [facility], [address], [place], [place] [address]  Troponin I (High Sensitivity)     Status: None   Collection Time: 04/23/22  8:13 AM  Result Value Ref  Range   Troponin I (High Sensitivity) 7 <18 ng/L    Comment: (NOTE) Elevated high sensitivity troponin I (hsTnI) values and significant  changes across serial measurements may suggest ACS but many other  chronic and acute conditions are known to elevate hsTnI results.  Refer to the "Links" section for chest  pain algorithms and additional  guidance. Performed at [facility], [address] [place], [place] [address]   Magnesium     Status: None   Collection Time: 04/23/22  8:13 AM  Result Value Ref Range   Magnesium 1.7 1.7 - 2.4 mg/dL    Comment: Performed at [facility], [address], [place], [place] [address]  Troponin I (High Sensitivity)     Status: None   Collection Time: 04/23/22 11:28 AM  Result Value Ref Range   Troponin I (High Sensitivity) 8 <18 ng/L    Comment: (NOTE) Elevated high sensitivity troponin I (hsTnI) values and significant  changes across serial measurements may suggest ACS but many other  chronic and acute conditions are known to elevate hsTnI results.  Refer to the "Links" section for chest pain algorithms and additional  guidance. Performed at [facility], [address] [place], [place] [address]    CT Angio Chest PE W and/or Wo Contrast  Result Date: 04/23/2022 CLINICAL DATA:  Chest pain. EXAM: CT ANGIOGRAPHY CHEST WITH CONTRAST TECHNIQUE: Multidetector CT imaging of the chest was performed using the standard protocol during bolus administration of intravenous contrast. Multiplanar CT image reconstructions and MIPs were obtained to evaluate the vascular anatomy. RADIATION DOSE REDUCTION: This exam was performed according to the departmental dose-optimization program which includes automated exposure control, adjustment of the mA and/or kV according to patient size and/or use of iterative reconstruction technique. CONTRAST:  50mL OMNIPAQUE IOHEXOL 350 MG/ML SOLN COMPARISON:  January 05, 2022.  FINDINGS: Cardiovascular: Satisfactory opacification of the pulmonary arteries to the segmental level. No evidence of pulmonary embolism. Mild cardiomegaly. No pericardial effusion. Mediastinum/Nodes: There remains significant wall thickening involving the distal esophagus concerning for esophagitis. Thyroid gland is not well visualized. No significant adenopathy is noted. Lungs/Pleura: No pneumothorax or pleural effusion is noted. Mild biapical scarring is noted. Minimal right posterior basilar subsegmental atelectasis is noted. Upper Abdomen: No acute abnormality. Musculoskeletal: No chest wall abnormality. No acute or significant osseous findings. Review of the MIP images confirms the above findings. IMPRESSION: No definite evidence of pulmonary embolus. Stable distal esophageal wall thickening concerning for esophagitis. Endoscopy may be performed for further evaluation. Minimal right posterior basilar subsegmental atelectasis. Aortic Atherosclerosis (ICD10-I70.0). Electronically Signed   By: Lupita Raider M.D.   On: 04/23/2022 10:55   DG Chest 2 View  Result Date: 04/23/2022 CLINICAL DATA:  Chest pain last night.  Dry cough. EXAM: CHEST - 2 VIEW COMPARISON:  Chest x-rays dated 03/22/2022 and 06/23/2020 FINDINGS: Borderline cardiomegaly. Lungs are clear. No pleural effusion or pneumothorax is seen. Osseous structures about the chest are unremarkable. IMPRESSION: No active cardiopulmonary disease. No evidence of pneumonia or pulmonary edema. Electronically Signed   By: Bary Richard M.D.   On: 04/23/2022 09:11    Pending Labs Unresulted Labs (From admission, onward)     Start     Ordered   04/24/22 0500  Basic metabolic panel  Tomorrow morning,   R        04/23/22 1151   04/24/22 0500  CBC  Tomorrow morning,   R        04/23/22 1151   04/23/22 1145  SARS Coronavirus 2 by RT PCR (hospital order, performed in [facility] hospital lab) *cepheid single result test* Nasopharyngeal Swab  (Tier 2 - SARS  Coronavirus 2 by RT PCR (hospital order, performed in [facility] hospital lab) *cepheid single result test*)  Once,  URGENT        04/23/22 1144   04/23/22 1145  Respiratory (~20 pathogens) panel by PCR  (Respiratory panel by PCR (~20 pathogens, ~24 hr TAT)  w precautions)  Once,   URGENT        04/23/22 1144            Vitals/Pain Today's Vitals   04/23/22 1000 04/23/22 1125 04/23/22 1130 04/23/22 1200  BP: (!) 114/53  (!) 110/45 (!) 107/52  Pulse: 82  84 81  Resp: 19  11 12   Temp:  98.3 F (36.8 C)    TempSrc:  Oral    SpO2: 97%  100% 97%  Weight:      Height:      PainSc:        Isolation Precautions Droplet precaution  Medications Medications  allopurinol (ZYLOPRIM) tablet 300 mg (has no administration in time range)  aspirin EC tablet 81 mg (has no administration in time range)  potassium chloride SA (KLOR-CON M) CR tablet 40 mEq (has no administration in time range)  atorvastatin (LIPITOR) tablet 20 mg (has no administration in time range)  ALPRAZolam (XANAX) tablet 0.25 mg (has no administration in time range)  levothyroxine (SYNTHROID) tablet 88 mcg (has no administration in time range)  pantoprazole (PROTONIX) EC tablet 40 mg (has no administration in time range)  predniSONE (DELTASONE) tablet 60 mg (has no administration in time range)  methylPREDNISolone sodium succinate (SOLU-MEDROL) 125 mg/2 mL injection 60 mg (has no administration in time range)  enoxaparin (LOVENOX) injection 40 mg (has no administration in time range)  sodium chloride flush (NS) 0.9 % injection 3 mL (has no administration in time range)  sodium chloride flush (NS) 0.9 % injection 3 mL (has no administration in time range)  0.9 %  sodium chloride infusion (has no administration in time range)  acetaminophen (TYLENOL) tablet 650 mg (has no administration in time range)    Or  acetaminophen (TYLENOL) suppository 650 mg (has no administration in time range)  ondansetron (ZOFRAN) tablet 4  mg (has no administration in time range)    Or  ondansetron (ZOFRAN) injection 4 mg (has no administration in time range)  mometasone-formoterol (DULERA) 200-5 MCG/ACT inhaler 2 puff (has no administration in time range)  ipratropium-albuterol (DUONEB) 0.5-2.5 (3) MG/3ML nebulizer solution 3 mL (has no administration in time range)  magnesium sulfate IVPB 2 g 50 mL (has no administration in time range)  potassium chloride (KLOR-CON) packet 40 mEq (40 mEq Oral Given 04/23/22 0938)  iohexol (OMNIPAQUE) 350 MG/ML injection 75 mL (75 mLs Intravenous Contrast Given 04/23/22 1037)  ipratropium-albuterol (DUONEB) 0.5-2.5 (3) MG/3ML nebulizer solution 3 mL (3 mLs Nebulization Given 04/23/22 1126)  pantoprazole (PROTONIX) injection 40 mg (40 mg Intravenous Given 04/23/22 1128)    Mobility walks     Focused Assessments Cardiac Assessment Handoff:  Cardiac Rhythm: Normal sinus rhythm Lab Results  Component Value Date   CKTOTAL 43 05/31/2007   CKMB 2.0 05/31/2007   TROPONINI <0.03 04/03/2016   No results found for: "DDIMER" Does the Patient currently have chest pain? No   , Pulmonary Assessment Handoff:  Lung sounds: L Breath Sounds: Clear R Breath Sounds: Diminished O2 Device: Room Air      R Recommendations: See Admitting Provider Note  Report given to:   Additional Notes: [id number]

## 2022-04-24 ENCOUNTER — Observation Stay (HOSPITAL_COMMUNITY): Payer: Medicare Other

## 2022-04-24 DIAGNOSIS — J9601 Acute respiratory failure with hypoxia: Secondary | ICD-10-CM | POA: Diagnosis not present

## 2022-04-24 DIAGNOSIS — R0602 Shortness of breath: Secondary | ICD-10-CM | POA: Diagnosis not present

## 2022-04-24 LAB — CBC
HCT: 34.5 % — ABNORMAL LOW (ref 36.0–46.0)
Hemoglobin: 10.9 g/dL — ABNORMAL LOW (ref 12.0–15.0)
MCH: 32.2 pg (ref 26.0–34.0)
MCHC: 31.6 g/dL (ref 30.0–36.0)
MCV: 101.8 fL — ABNORMAL HIGH (ref 80.0–100.0)
Platelets: 263 10*3/uL (ref 150–400)
RBC: 3.39 MIL/uL — ABNORMAL LOW (ref 3.87–5.11)
RDW: 13.6 % (ref 11.5–15.5)
WBC: 6 10*3/uL (ref 4.0–10.5)
nRBC: 0 % (ref 0.0–0.2)

## 2022-04-24 LAB — BASIC METABOLIC PANEL
Anion gap: 11 (ref 5–15)
BUN: 12 mg/dL (ref 8–23)
CO2: 27 mmol/L (ref 22–32)
Calcium: 8.8 mg/dL — ABNORMAL LOW (ref 8.9–10.3)
Chloride: 95 mmol/L — ABNORMAL LOW (ref 98–111)
Creatinine, Ser: 0.85 mg/dL (ref 0.44–1.00)
GFR, Estimated: >60 mL/min (ref >60)
Glucose, Bld: 192 mg/dL — ABNORMAL HIGH (ref 70–99)
Potassium: 4.1 mmol/L (ref 3.5–5.1)
Sodium: 133 mmol/L — ABNORMAL LOW (ref 135–145)

## 2022-04-24 LAB — ECHOCARDIOGRAM COMPLETE
AR max vel: 3.64 cm2
AV Area VTI: 3.75 cm2
AV Area mean vel: 3.58 cm2
AV Mean grad: 8.5 mmHg
AV Peak grad: 14.7 mmHg
Ao pk vel: 1.92 m/s
Area-P 1/2: 3.91 cm2
Height: 65 in
S' Lateral: 2 cm
Weight: 2240 oz

## 2022-04-24 MED ORDER — MIRTAZAPINE 15 MG PO TABS
7.5000 mg | ORAL_TABLET | Freq: Every day | ORAL | Status: DC
  Administered 2022-04-24: 7.5 mg via ORAL
  Filled 2022-04-24: qty 1

## 2022-04-24 MED ORDER — METOPROLOL TARTRATE 25 MG PO TABS
25.0000 mg | ORAL_TABLET | Freq: Two times a day (BID) | ORAL | Status: DC
  Administered 2022-04-24 – 2022-04-25 (×3): 25 mg via ORAL
  Filled 2022-04-24 (×3): qty 1

## 2022-04-24 MED ORDER — TRAMADOL HCL 50 MG PO TABS
50.0000 mg | ORAL_TABLET | Freq: Two times a day (BID) | ORAL | Status: DC | PRN
  Administered 2022-04-24 – 2022-04-25 (×3): 50 mg via ORAL
  Filled 2022-04-24 (×3): qty 1

## 2022-04-24 MED ORDER — FUROSEMIDE 40 MG PO TABS
20.0000 mg | ORAL_TABLET | Freq: Every day | ORAL | Status: DC
  Administered 2022-04-24: 20 mg via ORAL
  Filled 2022-04-24: qty 1

## 2022-04-24 MED ORDER — ALBUTEROL SULFATE (2.5 MG/3ML) 0.083% IN NEBU: 2.5000 mg | INHALATION_SOLUTION | RESPIRATORY_TRACT | Status: DC | PRN

## 2022-04-24 MED ORDER — IPRATROPIUM-ALBUTEROL 0.5-2.5 (3) MG/3ML IN SOLN
3.0000 mL | Freq: Three times a day (TID) | RESPIRATORY_TRACT | Status: DC
  Administered 2022-04-24 – 2022-04-25 (×3): 3 mL via RESPIRATORY_TRACT
  Filled 2022-04-24 (×3): qty 3

## 2022-04-24 MED ORDER — ARTIFICIAL TEARS OPHTHALMIC OINT
TOPICAL_OINTMENT | OPHTHALMIC | Status: DC | PRN
  Filled 2022-04-24 (×2): qty 3.5

## 2022-04-24 MED ORDER — FLUTICASONE PROPIONATE 50 MCG/ACT NA SUSP
1.0000 | Freq: Every day | NASAL | Status: DC
  Administered 2022-04-24 – 2022-04-25 (×2): 1 via NASAL
  Filled 2022-04-24: qty 16

## 2022-04-24 NOTE — Progress Notes (Signed)
  Echocardiogram 2D Echocardiogram has been performed.  Amy [name] 04/24/2022, 1:24 PM

## 2022-04-24 NOTE — Progress Notes (Signed)
O2 screening:  96% on 2L at rest, maintain while ambulating 87% on RA while ambutlating

## 2022-04-24 NOTE — Progress Notes (Signed)
PROGRESS NOTE    Amy [name]  ZHY:[id number] DOB: 1935/04/15 DOA: 04/23/2022 PCP: Richmond Campbell., PA-C    Brief Narrative:   Amy [name] is a 87 y.o. female with past medical history significant for COPD, HTN, HLD, anxiety, hypothyroidism, former smoker who presented to [facility] ED on 4/14 from home with chest tightness, shortness of breath.  Recently diagnosed with pneumonia and completed course of antibiotics and steroids.  She reports has been slow to recover to include increasing dyspnea over the last week.  Also endorses some swelling in her lower extremities.  Denies any fever or chills, no sick exposures.  Given progressive symptoms, patient sought further care in the ED.  In the ED, temperature 98.8 F, HR 91, RR 16, BP 123/58, SpO2 80% on room air, improved to the 90s after being placed on oxygen via nasal cannula.  WBC 8.1, hemoglobin 11.8, platelets 300.  Sodium 136, potassium 3.2, chloride 95, CO2 30, glucose 108, BUN 10, creatinine 0.98.  Magnesium 1.7.  High sensitive troponin 7 followed by 8.  Chest x-ray with no active cardiopulmonary disease process, no evidence of pneumonia or pulmonary edema.  CT angiogram chest PE with no definite evidence of pulm embolism, stable distal esophageal wall thickening concerning for esophagitis, mild right posterior basilar subsegmental atelectasis.  Vascular duplex ultrasound negative for DVT.  TRH consulted for admission for further evaluation management of acute hypoxic respiratory failure concerning for COPD exacerbation.  Assessment & Plan:   Acute respiratory failure with hypoxia, POA COPD exacerbation Patient presenting to ED with 1 week history of progressive shortness of breath.  Recently diagnosed with pneumonia and completed course of IV antibiotics and steroids.  On arrival, patient was noted be hypoxic with SpO2 80% on room air; not oxygen dependent at baseline.  Patient was afebrile without leukocytosis.   COVID/influenza A/B PCR negative.  Respiratory viral panel negative.  Chest x-ray and CT angiogram chest negative for infection, pulmonary edema or pulmonary embolism.  For some increased lower extremity edema, on Lasix at baseline and previous TTE with LVEF 60 to 65%, grade 1 diastolic dysfunction in 2021. -- Dulera 2 puffs twice daily -- Prednisone 60 mg p.o. daily -- DuoNebs TID -- albuterol neb q4h PRN SOB/wheezing -- O2 screen today w/ desat to 87% with ambulating  Chronic diastolic congestive heart failure Previous TTE with LVEF 60 to 65%, grade 1 diastolic dysfunction in 2021. -- repeat TTE: pending -- Continue with metoprolol tartrate 25 mg p.o. twice daily -- Furosemide 20 mg p.o. daily -- Strict I's and O's and daily weights  Hypokalemia Repleted on admission, repeat potassium this morning 4.1.  Essential hypertension -- Metoprolol tartrate 25 mg p.o. twice daily -- Furosemide 20 mg p.o. daily -- Hold amlodipine 5 mg p.o. daily given reported history of increased lower extremity edema -- Continue aspirin and statin  Hyperlipidemia -- Atorvastatin 20 mg p.o. daily  Anxiety -- Xanax 0.25 mg p.o. twice daily as needed anxiety  Hypothyroidism -- Levothyroxine 88 mcg p.o. daily   DVT prophylaxis: enoxaparin (LOVENOX) injection 40 mg Start: 04/23/22 2200    Code Status: Full Code Family Communication: Present at bedside this morning  Disposition Plan:  Level of care: Med-Surg Status is: Observation The patient remains OBS appropriate and will d/c before 2 midnights.    Consultants:  none  Procedures:  TTE: pending  Antimicrobials:  None   Subjective: Patient seen examined at bedside, resting calmly.  Lying in bed.  Continues to  endorse shortness of breath.  Pending echocardiogram.  No other specific complaints or concerns at this time.  Denies headache, no visual changes, no chest pain, no palpitations, no fever/chills/night sweats, no  nausea/vomiting/diarrhea, no cough/congestion, no focal weakness, no fatigue, no paresthesias.  No acute events overnight per nursing staff.  Objective: Vitals:   04/24/22 0619 04/24/22 0844 04/24/22 0934 04/24/22 1407  BP: 128/67   (!) 155/68  Pulse: 90  (!) 111 (!) 112  Resp: 18   17  Temp: 98 F (36.7 C)   97.6 F (36.4 C)  TempSrc: Oral   Oral  SpO2: 95% 96% 95% 96%  Weight:      Height:        Intake/Output Summary (Last 24 hours) at 04/24/2022 1418 Last data filed at 04/24/2022 0900 Gross per 24 hour  Intake 240 ml  Output --  Net 240 ml   Filed Weights   04/23/22 0756  Weight: 63.5 kg    Examination:  Physical Exam: GEN: NAD, alert and oriented x 3, elderly in appearance HEENT: NCAT, PERRL, EOMI, sclera clear, MMM PULM: Mild late entry wheezing bilateral lung fields, no crackles, normal [place] Tory effort without accessory muscle use, on 2 L nasal cannula with SpO2 95% at rest CV: RRR w/o M/G/R GI: abd soft, NTND, NABS, no R/G/M MSK: Trace bilateral lower extremity peripheral edema, muscle strength globally intact 5/5 bilateral upper/lower extremities NEURO: CN II-XII intact, no focal deficits, sensation to light touch intact PSYCH: normal mood/affect Integumentary: No concerning rashes/lesions/wounds noted on exposed skin surfaces    Data Reviewed: I have personally reviewed following labs and imaging studies  CBC: Recent Labs  Lab 04/23/22 0813 04/24/22 0344  WBC 8.1 6.0  HGB 11.8* 10.9*  HCT 37.4 34.5*  MCV 102.7* 101.8*  PLT 300 263   Basic Metabolic Panel: Recent Labs  Lab 04/23/22 0813 04/24/22 0344  NA 136 133*  K 3.2* 4.1  CL 95* 95*  CO2 30 27  GLUCOSE 108* 192*  BUN 10 12  CREATININE 0.98 0.85  CALCIUM 8.6* 8.8*  MG 1.7  --    GFR: Estimated Creatinine Clearance: 43.5 mL/min (by C-G formula based on SCr of 0.85 mg/dL). Liver Function Tests: No results for input(s): "AST", "ALT", "ALKPHOS", "BILITOT", "PROT", "ALBUMIN" in the  last 168 hours. No results for input(s): "LIPASE", "AMYLASE" in the last 168 hours. No results for input(s): "AMMONIA" in the last 168 hours. Coagulation Profile: No results for input(s): "INR", "PROTIME" in the last 168 hours. Cardiac Enzymes: No results for input(s): "CKTOTAL", "CKMB", "CKMBINDEX", "TROPONINI" in the last 168 hours. BNP (last 3 results) No results for input(s): "PROBNP" in the last 8760 hours. HbA1C: No results for input(s): "HGBA1C" in the last 72 hours. CBG: No results for input(s): "GLUCAP" in the last 168 hours. Lipid Profile: No results for input(s): "CHOL", "HDL", "LDLCALC", "TRIG", "CHOLHDL", "LDLDIRECT" in the last 72 hours. Thyroid Function Tests: No results for input(s): "TSH", "T4TOTAL", "FREET4", "T3FREE", "THYROIDAB" in the last 72 hours. Anemia Panel: No results for input(s): "VITAMINB12", "FOLATE", "FERRITIN", "TIBC", "IRON", "RETICCTPCT" in the last 72 hours. Sepsis Labs: No results for input(s): "PROCALCITON", "LATICACIDVEN" in the last 168 hours.  Recent Results (from the past 240 hour(s))  SARS Coronavirus 2 by RT PCR (hospital order, performed in [facility] hospital lab) *cepheid single result test* Nasopharyngeal Swab     Status: None   Collection Time: 04/23/22 12:01 PM   Specimen: Nasopharyngeal Swab; Nasal Swab  Result Value Ref  Range Status   SARS Coronavirus 2 by RT PCR NEGATIVE NEGATIVE Final    Comment: (NOTE) SARS-CoV-2 target nucleic acids are NOT DETECTED.  The SARS-CoV-2 RNA is generally detectable in upper and lower respiratory specimens during the acute phase of infection. The lowest concentration of SARS-CoV-2 viral copies this assay can detect is 250 copies / mL. A negative result does not preclude SARS-CoV-2 infection and should not be used as the sole basis for treatment or other patient management decisions.  A negative result may occur with improper specimen collection / handling, submission of specimen other than  nasopharyngeal swab, presence of viral mutation(s) within the areas targeted by this assay, and inadequate number of viral copies (<250 copies / mL). A negative result must be combined with clinical observations, patient history, and epidemiological information.  Fact Sheet for Patients:   RoadLapTop.co.za  Fact Sheet for Healthcare Providers: http://kim-miller.com/  This test is not yet approved or  cleared by the [place] FDA and has been authorized for detection and/or diagnosis of SARS-CoV-2 by FDA under an Emergency Use Authorization (EUA).  This EUA will remain in effect (meaning this test can be used) for the duration of the COVID-19 declaration under Section 564(b)(1) of the Act, 21 U.S.C. section 360bbb-3(b)(1), unless the authorization is terminated or revoked sooner.  Performed at [facility], [address] [place], [place] [address]   Respiratory (~20 pathogens) panel by PCR     Status: None   Collection Time: 04/23/22 12:01 PM   Specimen: Nasopharyngeal Swab; Respiratory  Result Value Ref Range Status   Adenovirus NOT DETECTED NOT DETECTED Final   Coronavirus 229E NOT DETECTED NOT DETECTED Final    Comment: (NOTE) The Coronavirus on the Respiratory Panel, DOES NOT test for the novel  Coronavirus (2019 nCoV)    Coronavirus HKU1 NOT DETECTED NOT DETECTED Final   Coronavirus NL63 NOT DETECTED NOT DETECTED Final   Coronavirus OC43 NOT DETECTED NOT DETECTED Final   Metapneumovirus NOT DETECTED NOT DETECTED Final   Rhinovirus / Enterovirus NOT DETECTED NOT DETECTED Final   Influenza A NOT DETECTED NOT DETECTED Final   Influenza B NOT DETECTED NOT DETECTED Final   Parainfluenza Virus 1 NOT DETECTED NOT DETECTED Final   Parainfluenza Virus 2 NOT DETECTED NOT DETECTED Final   Parainfluenza Virus 3 NOT DETECTED NOT DETECTED Final   Parainfluenza Virus 4 NOT DETECTED NOT DETECTED Final   Respiratory  Syncytial Virus NOT DETECTED NOT DETECTED Final   Bordetella pertussis NOT DETECTED NOT DETECTED Final   Bordetella Parapertussis NOT DETECTED NOT DETECTED Final   Chlamydophila pneumoniae NOT DETECTED NOT DETECTED Final   Mycoplasma pneumoniae NOT DETECTED NOT DETECTED Final    Comment: Performed at [facility], [address], [place], [place] [address]         Radiology Studies: VAS [place] LOWER EXTREMITY VENOUS (DVT)  Result Date: 04/24/2022  Lower Venous DVT Study Patient Name:  JHADA RISK  Date of Exam:   04/23/2022 Medical Rec #: [id number]     Accession #:    [id number] Date of Birth: 04-11-1935     Patient Gender: F Patient Age:   29 years Exam Location:  [facility] Procedure:      VAS [place] LOWER EXTREMITY VENOUS (DVT) Referring Phys: Osvaldo Shipper --------------------------------------------------------------------------------  Indications: Swelling, and SOB.  Comparison Study: No previous study. Performing Technologist: McKayla Maag RVT, [place]  Examination Guidelines: A complete evaluation includes B-mode imaging, spectral Doppler, color Doppler,  and power Doppler as needed of all accessible portions of each vessel. Bilateral testing is considered an integral part of a complete examination. Limited examinations for reoccurring indications may be performed as noted. The reflux portion of the exam is performed with the patient in reverse Trendelenburg.  +---------+---------------+---------+-----------+----------+--------------+ RIGHT    CompressibilityPhasicitySpontaneityPropertiesThrombus Aging +---------+---------------+---------+-----------+----------+--------------+ CFV      Full           Yes      Yes                                 +---------+---------------+---------+-----------+----------+--------------+ SFJ      Full                                                        +---------+---------------+---------+-----------+----------+--------------+ FV Prox   Full                                                        +---------+---------------+---------+-----------+----------+--------------+ FV Mid   Full                                                        +---------+---------------+---------+-----------+----------+--------------+ FV DistalFull                                                        +---------+---------------+---------+-----------+----------+--------------+ PFV      Full                                                        +---------+---------------+---------+-----------+----------+--------------+ POP      Full           Yes      Yes                                 +---------+---------------+---------+-----------+----------+--------------+ PTV      Full                                                        +---------+---------------+---------+-----------+----------+--------------+ PERO     Full                                                        +---------+---------------+---------+-----------+----------+--------------+   +---------+---------------+---------+-----------+----------+--------------+  LEFT     CompressibilityPhasicitySpontaneityPropertiesThrombus Aging +---------+---------------+---------+-----------+----------+--------------+ CFV      Full           Yes      Yes                                 +---------+---------------+---------+-----------+----------+--------------+ SFJ      Full                                                        +---------+---------------+---------+-----------+----------+--------------+ FV Prox  Full                                                        +---------+---------------+---------+-----------+----------+--------------+ FV Mid   Full                                                        +---------+---------------+---------+-----------+----------+--------------+ FV DistalFull                                                         +---------+---------------+---------+-----------+----------+--------------+ PFV      Full                                                        +---------+---------------+---------+-----------+----------+--------------+ POP      Full           Yes      Yes                                 +---------+---------------+---------+-----------+----------+--------------+ PTV      Full                                                        +---------+---------------+---------+-----------+----------+--------------+ PERO     Full                                                        +---------+---------------+---------+-----------+----------+--------------+     Summary: BILATERAL: - No evidence of deep vein thrombosis seen in the lower extremities, bilaterally. - No evidence of superficial venous thrombosis in the lower extremities, bilaterally. -No evidence of popliteal cyst, bilaterally.   *See table(s) above for measurements and observations. Electronically  signed by Coral Else MD on 04/24/2022 at 8:05:39 AM.    Final    CT Angio Chest PE W and/or Wo Contrast  Result Date: 04/23/2022 CLINICAL DATA:  Chest pain. EXAM: CT ANGIOGRAPHY CHEST WITH CONTRAST TECHNIQUE: Multidetector CT imaging of the chest was performed using the standard protocol during bolus administration of intravenous contrast. Multiplanar CT image reconstructions and MIPs were obtained to evaluate the vascular anatomy. RADIATION DOSE REDUCTION: This exam was performed according to the departmental dose-optimization program which includes automated exposure control, adjustment of the mA and/or kV according to patient size and/or use of iterative reconstruction technique. CONTRAST:  75mL OMNIPAQUE IOHEXOL 350 MG/ML SOLN COMPARISON:  January 05, 2022. FINDINGS: Cardiovascular: Satisfactory opacification of the pulmonary arteries to the segmental level. No evidence of pulmonary embolism. Mild cardiomegaly. No  pericardial effusion. Mediastinum/Nodes: There remains significant wall thickening involving the distal esophagus concerning for esophagitis. Thyroid gland is not well visualized. No significant adenopathy is noted. Lungs/Pleura: No pneumothorax or pleural effusion is noted. Mild biapical scarring is noted. Minimal right posterior basilar subsegmental atelectasis is noted. Upper Abdomen: No acute abnormality. Musculoskeletal: No chest wall abnormality. No acute or significant osseous findings. Review of the MIP images confirms the above findings. IMPRESSION: No definite evidence of pulmonary embolus. Stable distal esophageal wall thickening concerning for esophagitis. Endoscopy may be performed for further evaluation. Minimal right posterior basilar subsegmental atelectasis. Aortic Atherosclerosis (ICD10-I70.0). Electronically Signed   By: Lupita Raider M.D.   On: 04/23/2022 10:55   DG Chest 2 View  Result Date: 04/23/2022 CLINICAL DATA:  Chest pain last night.  Dry cough. EXAM: CHEST - 2 VIEW COMPARISON:  Chest x-rays dated 03/22/2022 and 06/23/2020 FINDINGS: Borderline cardiomegaly. Lungs are clear. No pleural effusion or pneumothorax is seen. Osseous structures about the chest are unremarkable. IMPRESSION: No active cardiopulmonary disease. No evidence of pneumonia or pulmonary edema. Electronically Signed   By: Bary Richard M.D.   On: 04/23/2022 09:11        Scheduled Meds:  allopurinol  300 mg Oral Daily   aspirin EC  81 mg Oral QHS   atorvastatin  20 mg Oral QHS   enoxaparin (LOVENOX) injection  40 mg Subcutaneous Q24H   fluticasone  1 spray Each Nare Daily   furosemide  20 mg Oral Daily   ipratropium-albuterol  3 mL Nebulization TID   levothyroxine  88 mcg Oral QAC breakfast   metoprolol tartrate  25 mg Oral BID   mirtazapine  7.5 mg Oral QHS   mometasone-formoterol  2 puff Inhalation BID   pantoprazole  40 mg Oral BID   predniSONE  60 mg Oral Q breakfast   sodium chloride flush  3  mL Intravenous Q12H   Continuous Infusions:  sodium chloride       LOS: 0 days    Time spent: 52 minutes spent on chart review, discussion with nursing staff, consultants, updating family and interview/physical exam; more than 50% of that time was spent in counseling and/or coordination of care.    Alvira Philips [place], DO [facility] Available via Epic secure chat 7am-7pm After these hours, please refer to coverage provider listed on [email or web address] 04/24/2022, 2:18 PM

## 2022-04-24 NOTE — Evaluation (Signed)
Physical Therapy Evaluation-1x Patient Details Name: Amy [name] MRN: [id number] DOB: [birth year] Today's Date: 04/24/2022  History of Present Illness  87 year old female with who was diagnosed with pneumonia about 3 weeks ago but slow to recover after Abx course. Presented with 1 week history of worsening shortness of breath. Admitted with  Acute respiratory failure with hypoxia: Saturations noted to be 80% on room air.  Improved to 90s on oxygen by nasal cannula. Past medical history of COPD, former smoker, essential hypertension, hyperlipidemia, hypothyroidism  Clinical Impression  On eval, pt was Mod Ind with mobility. She walked to and from bathroom without a device. No LOB. Deferred hallway ambulation 2* increased HR of 130 bpm and SpO2 87% on RA. Assisted pt back to her recliner. Do not anticipate any f/u PT needs at this time. Further mobility and HR/O2 monitoring with activity can be performed by nursing and/or mobility team. 1x eval. Will sign off.        Recommendations for follow up therapy are one component of a multi-disciplinary discharge planning process, led by the attending physician.  Recommendations may be updated based on patient status, additional functional criteria and insurance authorization.  Follow Up Recommendations       Assistance Recommended at Discharge Intermittent Supervision/Assistance  Patient can return home with the following       Equipment Recommendations None recommended by PT  Recommendations for Other Services       Functional Status Assessment Patient has had a recent decline in their functional status and demonstrates the ability to make significant improvements in function in a reasonable and predictable amount of time.     Precautions / Restrictions Precautions Precaution Comments: monitor O2 Restrictions Weight Bearing Restrictions: No      Mobility  Bed Mobility               General bed mobility comments: oob in recliner     Transfers Overall transfer level: Modified independent                      Ambulation/Gait Ambulation/Gait assistance: Modified independent (Device/Increase time) Gait Distance (Feet): 15 Feet (x2) Assistive device: None Gait Pattern/deviations: WFL(Within Functional Limits)       General Gait Details: walked to and from bathroom-no device-no lob. Distance limited due to increased HR of 130 bpm and SpO2 87% on RA. Cues for pursed lip breathing once seated.  Stairs            Wheelchair Mobility    Modified Rankin (Stroke Patients Only)       Balance Overall balance assessment: No apparent balance deficits (not formally assessed)                                           Pertinent Vitals/Pain Pain Assessment Pain Assessment: No/denies pain    Home Living Family/patient expects to be discharged to:: Private residence Living Arrangements: Alone Available Help at Discharge: Family Type of Home: House Home Access: Stairs to enter Entrance Stairs-Rails: Left Entrance Stairs-Number of Steps: 3   Home Layout: One level Home Equipment: Shower seat;Hand held shower head Additional Comments: Daughter about 25 min drive away.    Prior Function Prior Level of Function : Driving             Mobility Comments: independent ADLs Comments: Pt denied any common needs from family. Manages  her meds and does own grocery shopping. Likes to go to [place] with a friend.     Hand Dominance   Dominant Hand: Right    Extremity/Trunk Assessment   Upper Extremity Assessment Upper Extremity Assessment: Defer to OT evaluation    Lower Extremity Assessment Lower Extremity Assessment: Overall WFL for tasks assessed    Cervical / Trunk Assessment Cervical / Trunk Assessment: Normal  Communication   Communication: No difficulties  Cognition Arousal/Alertness: Awake/alert Behavior During Therapy: WFL for tasks assessed/performed Overall  Cognitive Status: Within Functional Limits for tasks assessed                                          General Comments      Exercises     Assessment/Plan    PT Assessment Patient does not need any further PT services  PT Problem List         PT Treatment Interventions      PT Goals (Current goals can be found in the Care Plan section)  Acute Rehab PT Goals Patient Stated Goal: to get home soon PT Goal Formulation: All assessment and education complete, DC therapy    Frequency       Co-evaluation               AM-PAC PT "6 Clicks" Mobility  Outcome Measure Help needed turning from your back to your side while in a flat bed without using bedrails?: None Help needed moving from lying on your back to sitting on the side of a flat bed without using bedrails?: None Help needed moving to and from a bed to a chair (including a wheelchair)?: None Help needed standing up from a chair using your arms (e.g., wheelchair or bedside chair)?: None Help needed to walk in hospital room?: None Help needed climbing 3-5 steps with a railing? : A Little 6 Click Score: 23    End of Session   Activity Tolerance:  (limited by HR and O2) Patient left: in chair;with call bell/phone within reach;with chair alarm set        Time: 2330-0762 PT Time Calculation (min) (ACUTE ONLY): 17 min   Charges:   PT Evaluation $PT Eval Low Complexity: 1 Low            Faye Ramsay, PT Acute Rehabilitation  Office: [phone]

## 2022-04-24 NOTE — Evaluation (Signed)
Occupational Therapy Evaluation Patient Details Name: Amy [name] MRN: [id number] DOB: 1936/01/06 Today's Date: 04/24/2022   History of Present Illness This is a 87 year old female with who was diagnosed with pneumonia about 3 weeks ago but slow to recover after Abx course. Presented with 1 week history of worsening shortness of breath. Admitted with  Acute respiratory failure with hypoxia: Saturations noted to be 80% on room air.  Improved to 90s on oxygen by nasal cannula. Past medical history of COPD, former smoker, essential hypertension, hyperlipidemia, hypothyroidism   Clinical Impression   Patient evaluated by Occupational Therapy with no further acute OT needs identified. All education, including energy conservation relating to pt's elevated HR and decreased SpO2 with handouts, has been completed and the patient has no further questions.  See below for any follow-up Occupational Therapy or equipment needs. OT is signing off. Thank you for this referral.     Recommendations for follow up therapy are one component of a multi-disciplinary discharge planning process, led by the attending physician.  Recommendations may be updated based on patient status, additional functional criteria and insurance authorization.   Assistance Recommended at Discharge PRN  Patient can return home with the following Assistance with cooking/housework (Assistance with energy conservation)    Functional Status Assessment  Patient has had a recent decline in their functional status and demonstrates the ability to make significant improvements in function in a reasonable and predictable amount of time.  Equipment Recommendations  None recommended by OT    Recommendations for Other Services       Precautions / Restrictions Precautions Precaution Comments: Keep O2>92%      Mobility Bed Mobility Overal bed mobility: Modified Independent             General bed mobility comments: HOB elevated     Transfers                          Balance Overall balance assessment: Modified Independent                                         ADL either performed or assessed with clinical judgement   ADL Overall ADL's : At baseline                                       General ADL Comments: On RA: HR up to 124 with standing grooming at sink. SpO2 as low as 90%.  Back to 2L O2 at rest: SpO2 96%, HR: 115.  Pt able to perform her basic ADLs at baseline per general assessment of balance, strength and UE function as well as standing at sink for grooming.  Pt stood from EOB Mod I, ambulated to sink Mod I and pivoted to recliner Mod I.  Pt educated on energy conservation with handsouts to reinforce concepts.     Vision   Vision Assessment?: No apparent visual deficits     Perception     Praxis      Pertinent Vitals/Pain Pain Assessment Pain Assessment: No/denies pain     Hand Dominance Right   Extremity/Trunk Assessment Upper Extremity Assessment Upper Extremity Assessment: Overall WFL for tasks assessed   Lower Extremity Assessment Lower Extremity Assessment: Defer to PT evaluation  Communication Communication Communication: No difficulties   Cognition Arousal/Alertness: Awake/alert Behavior During Therapy: WFL for tasks assessed/performed Overall Cognitive Status: Within Functional Limits for tasks assessed                                 General Comments: Admits to a little confusion. Repeats self at times. Very pleasant.     General Comments       Exercises     Shoulder Instructions      Home Living Family/patient expects to be discharged to:: Private residence Living Arrangements: Alone Available Help at Discharge: Family Type of Home: House Home Access: Stairs to enter [employer] of Steps: 3 Entrance Stairs-Rails: Left Home Layout: One level     Bathroom Shower/Tub: Tub/shower  unit;Door (sliding door)   Bathroom Toilet: Handicapped height     Home Equipment: Shower seat;Hand held shower head   Additional Comments: Daughter about 25 min drive away.      Prior Functioning/Environment Prior Level of Function : Driving               ADLs Comments: Pt denied any common needs from family. Manages her meds and does own grocery shopping. Likes to go to [place] with a friend.        OT Problem List: Decreased activity tolerance      OT Treatment/Interventions:      OT Goals(Current goals can be found in the care plan section) Acute Rehab OT Goals OT Goal Formulation: All assessment and education complete, DC therapy ADL Goals Additional ADL Goal #1: Patient will identify at least 3 energy conservation strategies to employ at home in order to maximize function and quality of life and decrease caregiver burden while preventing exacerbation of symptoms and rehospitalization.  OT Frequency:      Co-evaluation              AM-PAC OT "6 Clicks" Daily Activity     Outcome Measure Help from another person eating meals?: None Help from another person taking care of personal grooming?: None Help from another person toileting, which includes using toliet, bedpan, or urinal?: None Help from another person bathing (including washing, rinsing, drying)?: A Little Help from another person to put on and taking off regular upper body clothing?: None Help from another person to put on and taking off regular lower body clothing?: A Little 6 Click Score: 22   End of Session Equipment Utilized During Treatment: Oxygen Nurse Communication: Other (comment) (O2 and HR  changes during ADLs)  Activity Tolerance: Patient tolerated treatment well Patient left: in chair;with call bell/phone within reach;with chair alarm set  OT Visit Diagnosis:  (Decreased ADLs)                Time: 6073-7106 OT Time Calculation (min): 44 min Charges:  OT General Charges $OT Visit: 1  Visit OT Evaluation $OT Eval Low Complexity: 1 Low OT Treatments $Self Care/Home Management : 8-22 mins $Therapeutic Activity: 8-22 mins  Amy [name], OT Acute Rehab Services Office: [phone] 04/24/2022  Theodoro Clock 04/24/2022, 10:02 AM

## 2022-04-24 NOTE — TOC Progression Note (Signed)
Transition of Care [facility]) - Progression Note    Patient Details  Name: Amy [name] MRN: [id number] Date of Birth: 1935-03-15  Transition of Care [facility]) CM/SW Contact  Beckie Busing, RN Phone Number:[phone]  04/24/2022, 12:10 PM  Clinical Narrative:     Transition of Care [facility]) Screening Note   Patient Details  Name: Amy [name] Date of Birth: 16-Dec-1935   Transition of Care [facility]) CM/SW Contact:    Beckie Busing, RN Phone Number: 04/24/2022, 12:10 PM    Transition of Care Department [facility]) has reviewed patient and no TOC needs have been identified at this time. We will continue to monitor patient advancement through interdisciplinary progression rounds. If new patient transition needs arise, please place a TOC consult.          Expected Discharge Plan and Services                                               Social Determinants of Health (SDOH) Interventions SDOH Screenings   Food Insecurity: No Food Insecurity (04/23/2022)  Housing: Low Risk  (04/23/2022)  Transportation Needs: No Transportation Needs (04/23/2022)  Utilities: Not At Risk (04/23/2022)  Tobacco Use: Medium Risk (04/23/2022)    Readmission Risk Interventions     No data to display

## 2022-04-25 DIAGNOSIS — I480 Paroxysmal atrial fibrillation: Secondary | ICD-10-CM | POA: Diagnosis present

## 2022-04-25 DIAGNOSIS — Z888 Allergy status to other drugs, medicaments and biological substances status: Secondary | ICD-10-CM | POA: Diagnosis not present

## 2022-04-25 DIAGNOSIS — Z87891 Personal history of nicotine dependence: Secondary | ICD-10-CM | POA: Diagnosis not present

## 2022-04-25 DIAGNOSIS — Z79890 Hormone replacement therapy (postmenopausal): Secondary | ICD-10-CM | POA: Diagnosis not present

## 2022-04-25 DIAGNOSIS — J9601 Acute respiratory failure with hypoxia: Secondary | ICD-10-CM | POA: Diagnosis present

## 2022-04-25 DIAGNOSIS — E039 Hypothyroidism, unspecified: Secondary | ICD-10-CM | POA: Diagnosis present

## 2022-04-25 DIAGNOSIS — J441 Chronic obstructive pulmonary disease with (acute) exacerbation: Secondary | ICD-10-CM | POA: Diagnosis present

## 2022-04-25 DIAGNOSIS — K219 Gastro-esophageal reflux disease without esophagitis: Secondary | ICD-10-CM | POA: Diagnosis present

## 2022-04-25 DIAGNOSIS — Z885 Allergy status to narcotic agent status: Secondary | ICD-10-CM | POA: Diagnosis not present

## 2022-04-25 DIAGNOSIS — Z79899 Other long term (current) drug therapy: Secondary | ICD-10-CM | POA: Diagnosis not present

## 2022-04-25 DIAGNOSIS — Z1152 Encounter for screening for COVID-19: Secondary | ICD-10-CM | POA: Diagnosis not present

## 2022-04-25 DIAGNOSIS — E876 Hypokalemia: Secondary | ICD-10-CM | POA: Diagnosis present

## 2022-04-25 DIAGNOSIS — E785 Hyperlipidemia, unspecified: Secondary | ICD-10-CM | POA: Diagnosis present

## 2022-04-25 DIAGNOSIS — Z7982 Long term (current) use of aspirin: Secondary | ICD-10-CM | POA: Diagnosis not present

## 2022-04-25 DIAGNOSIS — I110 Hypertensive heart disease with heart failure: Secondary | ICD-10-CM | POA: Diagnosis present

## 2022-04-25 DIAGNOSIS — I5032 Chronic diastolic (congestive) heart failure: Secondary | ICD-10-CM | POA: Diagnosis present

## 2022-04-25 DIAGNOSIS — Z8249 Family history of ischemic heart disease and other diseases of the circulatory system: Secondary | ICD-10-CM | POA: Diagnosis not present

## 2022-04-25 DIAGNOSIS — M7989 Other specified soft tissue disorders: Secondary | ICD-10-CM | POA: Diagnosis present

## 2022-04-25 DIAGNOSIS — F419 Anxiety disorder, unspecified: Secondary | ICD-10-CM | POA: Diagnosis present

## 2022-04-25 MED ORDER — FUROSEMIDE 40 MG PO TABS: 40.0000 mg | ORAL_TABLET | Freq: Every day | ORAL | 2 refills | Status: DC

## 2022-04-25 MED ORDER — FUROSEMIDE 10 MG/ML IJ SOLN
40.0000 mg | Freq: Every day | INTRAMUSCULAR | Status: DC
  Administered 2022-04-25: 40 mg via INTRAVENOUS
  Filled 2022-04-25: qty 4

## 2022-04-25 MED ORDER — PREDNISONE 10 MG PO TABS: ORAL_TABLET | ORAL | 0 refills | Status: AC

## 2022-04-25 MED ORDER — GABAPENTIN 100 MG PO CAPS
200.0000 mg | ORAL_CAPSULE | Freq: Once | ORAL | Status: AC
  Administered 2022-04-25: 200 mg via ORAL
  Filled 2022-04-25: qty 2

## 2022-04-25 MED ORDER — DULERA 200-5 MCG/ACT IN AERO: 2.0000 | INHALATION_SPRAY | Freq: Two times a day (BID) | RESPIRATORY_TRACT | 2 refills | Status: AC

## 2022-04-25 NOTE — Discharge Summary (Signed)
Physician Discharge Summary  ADEA Amy [name]:[id number] DOB: 1935-04-25 DOA: 04/23/2022  PCP: Richmond Campbell., PA-C  Admit date: 04/23/2022 Discharge date: 04/25/2022  Admitted From: Home Disposition: Home  Recommendations for Outpatient Follow-up:  Follow up with PCP in 1-2 weeks Continue prednisone taper on discharge for COPD exacerbation Furosemide increased to 40 mg p.o. daily Please obtain BMP in one week to ensure renal function stable on increased dose of furosemide  Home Health: No Equipment/Devices: None  Discharge Condition: Stable CODE STATUS: Full code Diet recommendation: Heart healthy diet  History of present illness:  Amy [name] is a 87 y.o. female with past medical history significant for COPD, HTN, HLD, anxiety, hypothyroidism, former smoker who presented to [facility] ED on 4/14 from home with chest tightness, shortness of breath.  Recently diagnosed with pneumonia and completed course of antibiotics and steroids.  She reports has been slow to recover to include increasing dyspnea over the last week.  Also endorses some swelling in her lower extremities.  Denies any fever or chills, no sick exposures.  Given progressive symptoms, patient sought further care in the ED.   In the ED, temperature 98.8 F, HR 91, RR 16, BP 123/58, SpO2 80% on room air, improved to the 90s after being placed on oxygen via nasal cannula.  WBC 8.1, hemoglobin 11.8, platelets 300.  Sodium 136, potassium 3.2, chloride 95, CO2 30, glucose 108, BUN 10, creatinine 0.98.  Magnesium 1.7.  High sensitive troponin 7 followed by 8.  Chest x-ray with no active cardiopulmonary disease process, no evidence of pneumonia or pulmonary edema.  CT angiogram chest PE with no definite evidence of pulm embolism, stable distal esophageal wall thickening concerning for esophagitis, mild right posterior basilar subsegmental atelectasis.  Vascular duplex ultrasound negative for DVT.  TRH consulted for  admission for further evaluation management of acute hypoxic respiratory failure concerning for COPD exacerbation.  Hospital course:  Acute respiratory failure with hypoxia, POA COPD exacerbation Patient presenting to ED with 1 week history of progressive shortness of breath.  Recently diagnosed with pneumonia and completed course of IV antibiotics and steroids.  On arrival, patient was noted be hypoxic with SpO2 80% on room air; not oxygen dependent at baseline.  Patient was afebrile without leukocytosis.  COVID/influenza A/B PCR negative.  Respiratory viral panel negative.  Chest x-ray and CT angiogram chest negative for infection, pulmonary edema or pulmonary embolism. Has some increased lower extremity edema, on Lasix at baseline and previous TTE with LVEF 60 to 65%, grade 1 diastolic dysfunction in 2021.  Patient was started on Dulera, prednisone and received scheduled nebs with improvement of symptoms.  Oxygen was titrated off; and no desaturation on ambulatory O2 screen at time of discharge.  Will start patient on [facility] outpatient continue prednisone taper on discharge.    Chronic diastolic congestive heart failure Previous TTE with LVEF 60 to 65%, grade 1 diastolic dysfunction in 2021.  Repeat TTE with LVEF 60 to 65%, grade 1 diastolic dysfunction with IVC dilation.  Patient was started on IV diuresis with furosemide with improvement of her symptoms and was weaned off of supplemental oxygen.  Will increase home furosemide to 40 mg p.o. daily.  Toprol tartrate 25 mg p.o. twice daily.  Recommend repeat BMP 1 week to ensure renal function remained stable on higher dose of diuretic.   Hypokalemia Repleted during hospitalization   Essential hypertension Metoprolol tartrate 25 mg p.o. twice daily zotepine 5 mg p.o. daily, furosemide increased to 40  mg p.o. daily.  Continue aspirin and statin.   Hyperlipidemia Atorvastatin 20 mg p.o. daily   Anxiety Xanax 0.25 mg p.o. twice daily as needed  anxiety   Hypothyroidism Levothyroxine 88 mcg p.o. daily  Discharge Diagnoses:  Principal Problem:   Acute respiratory failure with hypoxia Active Problems:   Essential hypertension   COPD GOLD III   GERD (gastroesophageal reflux disease)   Hypothyroidism   Hyperlipidemia   Pedal edema   COPD with acute exacerbation    Discharge Instructions  Discharge Instructions     Call MD for:  difficulty breathing, headache or visual disturbances   Complete by: As directed    Call MD for:  extreme fatigue   Complete by: As directed    Call MD for:  persistant dizziness or light-headedness   Complete by: As directed    Call MD for:  persistant nausea and vomiting   Complete by: As directed    Call MD for:  severe uncontrolled pain   Complete by: As directed    Call MD for:  temperature >100.4   Complete by: As directed    Diet - low sodium heart healthy   Complete by: As directed    Increase activity slowly   Complete by: As directed       Allergies as of 04/25/2022       Reactions   Other Anaphylaxis   Swelling in throat Bee Sting   Codeine Other (See Comments)   Reaction:  Hallucinations Other reaction(s): Confusion        Medication List     TAKE these medications    albuterol 108 (90 Base) MCG/ACT inhaler Commonly known as: VENTOLIN HFA Inhale 2 puffs into the lungs every 4 (four) hours as needed for wheezing or shortness of breath.   allopurinol 300 MG tablet Commonly known as: ZYLOPRIM Take 300 mg by mouth daily.   ALPRAZolam 0.25 MG tablet Commonly known as: XANAX Take 0.25 mg by mouth 2 (two) times daily.   amLODipine 5 MG tablet Commonly known as: NORVASC Take 5 mg by mouth daily.   aspirin EC 81 MG tablet Take 81 mg by mouth at bedtime.   atorvastatin 20 MG tablet Commonly known as: LIPITOR Take 20 mg by mouth at bedtime.   cyanocobalamin 1000 MCG tablet Commonly known as: VITAMIN B12 Take 1,000 mcg by mouth daily.   Dulera 200-5  MCG/ACT Aero Generic drug: mometasone-formoterol Inhale 2 puffs into the lungs 2 (two) times daily.   fluticasone 50 MCG/ACT nasal spray Commonly known as: FLONASE Administer 2 sprays in each nostril daily.   furosemide 40 MG tablet Commonly known as: LASIX Take 1 tablet (40 mg total) by mouth daily. What changed:  medication strength how much to take   gabapentin 100 MG capsule Commonly known as: NEURONTIN TAKE 1 CAP EVERY MORNING, 1 CAP IN THE AFTERNOON, AND 2 CAPS EVERY DAY AT BEDTIME What changed: See the new instructions.   levothyroxine 88 MCG tablet Commonly known as: SYNTHROID Take 88 mcg by mouth daily before breakfast.   metoprolol tartrate 25 MG tablet Commonly known as: LOPRESSOR Take 25 mg by mouth 2 (two) times daily.   mirtazapine 7.5 MG tablet Commonly known as: REMERON Take 7.5 mg by mouth at bedtime.   mupirocin ointment 2 % Commonly known as: BACTROBAN Apply 1 Application topically 2 (two) times daily.   neomycin-polymyxin b-dexamethasone 3.5-10000-0.1 Susp Commonly known as: MAXITROL Place 1 drop into the left eye in the morning, at  noon, in the evening, and at bedtime.   pantoprazole 40 MG tablet Commonly known as: PROTONIX Take 40 mg by mouth 2 (two) times daily.   Potassium 99 MG Tabs Take 99 mg by mouth at bedtime.   predniSONE 10 MG tablet Commonly known as: DELTASONE Take 4 tablets (40 mg total) by mouth daily for 2 days, THEN 3 tablets (30 mg total) daily for 2 days, THEN 2 tablets (20 mg total) daily for 2 days, THEN 1 tablet (10 mg total) daily for 2 days. Start taking on: April 26, 2022   traMADol 50 MG tablet Commonly known as: ULTRAM Take 50 mg by mouth 2 (two) times daily as needed for moderate pain.   Vitamin D3 50 MCG (2000 UT) Tabs Take 2,000 Units by mouth at bedtime.        Follow-up Information     Richmond Campbell., PA-C. Schedule an appointment as soon as possible for a visit in 1 week(s).   Specialty: Family  Medicine Contact information: [address] [place] [address] [phone]                Allergies  Allergen Reactions   Other Anaphylaxis    Swelling in throat Bee Sting   Codeine Other (See Comments)    Reaction:  Hallucinations Other reaction(s): Confusion    Consultations: None   Procedures/Studies: ECHOCARDIOGRAM COMPLETE  Result Date: 04/24/2022    ECHOCARDIOGRAM REPORT   Patient Name:   BRANDYCE DIMARIO Date of Exam: 04/24/2022 Medical Rec #:  [id number]    Height:       65.0 in Accession #:    [id number]   Weight:       140.0 lb Date of Birth:  1935/05/05    BSA:          1.700 m Patient Age:    87 years     BP:           128/67 mmHg Patient Gender: F            HR:           103 bpm. Exam Location:  Inpatient Procedure: 2D Echo, Cardiac Doppler and Color Doppler Indications:    R06.02 SOB  History:        Patient has prior history of Echocardiogram examinations, most                 recent 10/27/2019. Abnormal ECG, Stroke, Arrythmias:Atrial                 Fibrillation, Signs/Symptoms:Edema and Murmur; Risk                 Factors:Hypertension, Dyslipidemia and Former Smoker. Lymphoma                 2021.  Sonographer:    Sheralyn Boatman RDCS Referring Phys: [id number] [facility]  Sonographer Comments: Technically difficult study due to poor echo windows. IMPRESSIONS  1. Left ventricular ejection fraction, by estimation, is 65 to 70%. The left ventricle has normal function. The left ventricle has no regional wall motion abnormalities. There is mild concentric left ventricular hypertrophy. Left ventricular diastolic parameters are consistent with Grade I diastolic dysfunction (impaired relaxation).  2. Right ventricular systolic function is low normal. The right ventricular size is mildly enlarged. There is moderately elevated pulmonary artery systolic pressure.  3. The mitral valve is normal in structure. Mild mitral valve regurgitation. No evidence of mitral stenosis.  4. The  aortic valve is normal in structure. Aortic valve regurgitation is trivial. No aortic stenosis is present. Aortic valve area, by VTI measures 3.75 cm. Aortic valve mean gradient measures 8.5 mmHg. Aortic valve Vmax measures 1.92 m/s.  5. The inferior vena cava is dilated in size with <50% respiratory variability, suggesting right atrial pressure of 15 mmHg. FINDINGS  Left Ventricle: Left ventricular ejection fraction, by estimation, is 65 to 70%. The left ventricle has normal function. The left ventricle has no regional wall motion abnormalities. The left ventricular internal cavity size was normal in size. There is  mild concentric left ventricular hypertrophy. Left ventricular diastolic parameters are consistent with Grade I diastolic dysfunction (impaired relaxation). Right Ventricle: The right ventricular size is mildly enlarged. No increase in right ventricular wall thickness. Right ventricular systolic function is low normal. There is moderately elevated pulmonary artery systolic pressure. The tricuspid regurgitant  velocity is 2.92 m/s, and with an assumed right atrial pressure of 15 mmHg, the estimated right ventricular systolic pressure is 49.1 mmHg. Left Atrium: Left atrial size was normal in size. Right Atrium: Right atrial size was normal in size. Pericardium: There is no evidence of pericardial effusion. Mitral Valve: The mitral valve is normal in structure. Mild mitral valve regurgitation. No evidence of mitral valve stenosis. Tricuspid Valve: The tricuspid valve is normal in structure. Tricuspid valve regurgitation is trivial. No evidence of tricuspid stenosis. Aortic Valve: The aortic valve is normal in structure. Aortic valve regurgitation is trivial. No aortic stenosis is present. Aortic valve mean gradient measures 8.5 mmHg. Aortic valve peak gradient measures 14.7 mmHg. Aortic valve area, by VTI measures 3.75 cm. Pulmonic Valve: The pulmonic valve was normal in structure. Pulmonic valve  regurgitation is not visualized. No evidence of pulmonic stenosis. Aorta: The aortic root is normal in size and structure. Venous: The inferior vena cava is dilated in size with less than 50% respiratory variability, suggesting right atrial pressure of 15 mmHg. IAS/Shunts: No atrial level shunt detected by color flow Doppler.  LEFT VENTRICLE PLAX 2D LVIDd:         4.30 cm   Diastology LVIDs:         2.00 cm   LV e' medial:    7.94 cm/s LV PW:         1.10 cm   LV E/e' medial:  16.6 LV IVS:        1.00 cm   LV e' lateral:   6.64 cm/s LVOT diam:     2.20 cm   LV E/e' lateral: 19.9 LV SV:         118 LV SV Index:   70 LVOT Area:     3.80 cm  RIGHT VENTRICLE             IVC RV S prime:     17.00 cm/s  IVC diam: 2.40 cm TAPSE (M-mode): 3.0 cm LEFT ATRIUM             Index        RIGHT ATRIUM          Index LA diam:        3.80 cm 2.24 cm/m   RA Area:     9.16 cm LA Vol (A2C):   34.5 ml 20.30 ml/m  RA Volume:   16.60 ml 9.77 ml/m LA Vol (A4C):   31.9 ml 18.77 ml/m LA Biplane Vol: 34.5 ml 20.30 ml/m  AORTIC VALVE AV Area (Vmax):    3.64 cm AV Area (  Vmean):   3.58 cm AV Area (VTI):     3.75 cm AV Vmax:           192.00 cm/s AV Vmean:          137.000 cm/s AV VTI:            0.316 m AV Peak Grad:      14.7 mmHg AV Mean Grad:      8.5 mmHg LVOT Vmax:         184.00 cm/s LVOT Vmean:        129.000 cm/s LVOT VTI:          0.311 m LVOT/AV VTI ratio: 0.99  AORTA Ao Root diam: 2.90 cm Ao Asc diam:  3.10 cm MITRAL VALVE                TRICUSPID VALVE MV Area (PHT): 3.91 cm     TR Peak grad:   34.1 mmHg MV Decel Time: 194 msec     TR Vmax:        292.00 cm/s MV E velocity: 132.00 cm/s MV A velocity: 115.00 cm/s  SHUNTS MV E/A ratio:  1.15         Systemic VTI:  0.31 m                             Systemic Diam: 2.20 cm Kardie Tobb DO Electronically signed by Thomasene Ripple DO Signature Date/Time: 04/24/2022/4:24:45 PM    Final    VAS [place] LOWER EXTREMITY VENOUS (DVT)  Result Date: 04/24/2022  Lower Venous DVT Study Patient  Name:  KARMEL PATRICELLI  Date of Exam:   04/23/2022 Medical Rec #: [id number]     Accession #:    [id number] Date of Birth: 1935-09-29     Patient Gender: F Patient Age:   68 years Exam Location:  [facility] Procedure:      VAS [place] LOWER EXTREMITY VENOUS (DVT) Referring Phys: Osvaldo Shipper --------------------------------------------------------------------------------  Indications: Swelling, and SOB.  Comparison Study: No previous study. Performing Technologist: McKayla Maag RVT, VT  Examination Guidelines: A complete evaluation includes B-mode imaging, spectral Doppler, color Doppler, and power Doppler as needed of all accessible portions of each vessel. Bilateral testing is considered an integral part of a complete examination. Limited examinations for reoccurring indications may be performed as noted. The reflux portion of the exam is performed with the patient in reverse Trendelenburg.  +---------+---------------+---------+-----------+----------+--------------+ RIGHT    CompressibilityPhasicitySpontaneityPropertiesThrombus Aging +---------+---------------+---------+-----------+----------+--------------+ CFV      Full           Yes      Yes                                 +---------+---------------+---------+-----------+----------+--------------+ SFJ      Full                                                        +---------+---------------+---------+-----------+----------+--------------+ FV Prox  Full                                                        +---------+---------------+---------+-----------+----------+--------------+  FV Mid   Full                                                        +---------+---------------+---------+-----------+----------+--------------+ FV DistalFull                                                        +---------+---------------+---------+-----------+----------+--------------+ PFV      Full                                                         +---------+---------------+---------+-----------+----------+--------------+ POP      Full           Yes      Yes                                 +---------+---------------+---------+-----------+----------+--------------+ PTV      Full                                                        +---------+---------------+---------+-----------+----------+--------------+ PERO     Full                                                        +---------+---------------+---------+-----------+----------+--------------+   +---------+---------------+---------+-----------+----------+--------------+ LEFT     CompressibilityPhasicitySpontaneityPropertiesThrombus Aging +---------+---------------+---------+-----------+----------+--------------+ CFV      Full           Yes      Yes                                 +---------+---------------+---------+-----------+----------+--------------+ SFJ      Full                                                        +---------+---------------+---------+-----------+----------+--------------+ FV Prox  Full                                                        +---------+---------------+---------+-----------+----------+--------------+ FV Mid   Full                                                        +---------+---------------+---------+-----------+----------+--------------+  FV DistalFull                                                        +---------+---------------+---------+-----------+----------+--------------+ PFV      Full                                                        +---------+---------------+---------+-----------+----------+--------------+ POP      Full           Yes      Yes                                 +---------+---------------+---------+-----------+----------+--------------+ PTV      Full                                                         +---------+---------------+---------+-----------+----------+--------------+ PERO     Full                                                        +---------+---------------+---------+-----------+----------+--------------+     Summary: BILATERAL: - No evidence of deep vein thrombosis seen in the lower extremities, bilaterally. - No evidence of superficial venous thrombosis in the lower extremities, bilaterally. -No evidence of popliteal cyst, bilaterally.   *See table(s) above for measurements and observations. Electronically signed by Coral Else MD on 04/24/2022 at 8:05:39 AM.    Final    CT Angio Chest PE W and/or Wo Contrast  Result Date: 04/23/2022 CLINICAL DATA:  Chest pain. EXAM: CT ANGIOGRAPHY CHEST WITH CONTRAST TECHNIQUE: Multidetector CT imaging of the chest was performed using the standard protocol during bolus administration of intravenous contrast. Multiplanar CT image reconstructions and MIPs were obtained to evaluate the vascular anatomy. RADIATION DOSE REDUCTION: This exam was performed according to the departmental dose-optimization program which includes automated exposure control, adjustment of the mA and/or kV according to patient size and/or use of iterative reconstruction technique. CONTRAST:  75mL OMNIPAQUE IOHEXOL 350 MG/ML SOLN COMPARISON:  January 05, 2022. FINDINGS: Cardiovascular: Satisfactory opacification of the pulmonary arteries to the segmental level. No evidence of pulmonary embolism. Mild cardiomegaly. No pericardial effusion. Mediastinum/Nodes: There remains significant wall thickening involving the distal esophagus concerning for esophagitis. Thyroid gland is not well visualized. No significant adenopathy is noted. Lungs/Pleura: No pneumothorax or pleural effusion is noted. Mild biapical scarring is noted. Minimal right posterior basilar subsegmental atelectasis is noted. Upper Abdomen: No acute abnormality. Musculoskeletal: No chest wall abnormality. No acute or  significant osseous findings. Review of the MIP images confirms the above findings. IMPRESSION: No definite evidence of pulmonary embolus. Stable distal esophageal wall thickening concerning for esophagitis. Endoscopy may be performed for further evaluation. Minimal right posterior basilar subsegmental atelectasis. Aortic Atherosclerosis (ICD10-I70.0). Electronically Signed   By: Fayrene Fearing  Christen Butter M.D.   On: 04/23/2022 10:55   DG Chest 2 View  Result Date: 04/23/2022 CLINICAL DATA:  Chest pain last night.  Dry cough. EXAM: CHEST - 2 VIEW COMPARISON:  Chest x-rays dated 03/22/2022 and 06/23/2020 FINDINGS: Borderline cardiomegaly. Lungs are clear. No pleural effusion or pneumothorax is seen. Osseous structures about the chest are unremarkable. IMPRESSION: No active cardiopulmonary disease. No evidence of pneumonia or pulmonary edema. Electronically Signed   By: Bary Richard M.D.   On: 04/23/2022 09:11     Subjective: Patient seen examined bedside, resting comfortably.  Lying in bed.  Walked around the unit with nursing staff this morning off of oxygen with no significant desaturation.  Patient states ready for discharge home.  Discussed will start on Dulera inhaler and give prednisone taper.  Also discussed will increase her furosemide to 40 mg p.o. daily for increased lower extremity edema.  No other specific questions or concerns at this time.  Denies headache, no dizziness, no chest pain, no palpitations, no shortness of breath, no abdominal pain, no focal weakness, no fatigue, no fever/chills/night sweats, no nausea/vomiting/diarrhea, no paresthesias.  No acute events overnight per nursing staff.  Discharge Exam: Vitals:   04/25/22 0606 04/25/22 0855  BP: 126/66   Pulse: 80   Resp: 18   Temp: 98.1 F (36.7 C)   SpO2: 97% 92%   Vitals:   04/24/22 1943 04/25/22 0500 04/25/22 0606 04/25/22 0855  BP: (!) 144/71  126/66   Pulse: 86  80   Resp: 18  18   Temp: 97.9 F (36.6 C)  98.1 F (36.7 C)    TempSrc: Oral  Oral   SpO2: 96%  97% 92%  Weight:  66.2 kg    Height:        Physical Exam: GEN: NAD, alert and oriented x 3, elderly in appearance HEENT: NCAT, PERRL, EOMI, sclera clear, MMM PULM: CTAB w/o wheezes/crackles, normal respiratory effort, on room air CV: RRR w/o M/G/R GI: abd soft, NTND, NABS, no R/G/M MSK: Trace lower extremity bilateral peripheral edema, muscle strength globally intact 5/5 bilateral upper/lower extremities NEURO: CN II-XII intact, no focal deficits, sensation to light touch intact PSYCH: normal mood/affect Integumentary: dry/intact, no rashes or wounds    The results of significant diagnostics from this hospitalization (including imaging, microbiology, ancillary and laboratory) are listed below for reference.     Microbiology: Recent Results (from the past 240 hour(s))  SARS Coronavirus 2 by RT PCR (hospital order, performed in [facility] hospital lab) *cepheid single result test* Nasopharyngeal Swab     Status: None   Collection Time: 04/23/22 12:01 PM   Specimen: Nasopharyngeal Swab; Nasal Swab  Result Value Ref Range Status   SARS Coronavirus 2 by RT PCR NEGATIVE NEGATIVE Final    Comment: (NOTE) SARS-CoV-2 target nucleic acids are NOT DETECTED.  The SARS-CoV-2 RNA is generally detectable in upper and lower respiratory specimens during the acute phase of infection. The lowest concentration of SARS-CoV-2 viral copies this assay can detect is 250 copies / mL. A negative result does not preclude SARS-CoV-2 infection and should not be used as the sole basis for treatment or other patient management decisions.  A negative result may occur with improper specimen collection / handling, submission of specimen other than nasopharyngeal swab, presence of viral mutation(s) within the areas targeted by this assay, and inadequate number of viral copies (<250 copies / mL). A negative result must be combined with clinical observations, patient history,  and epidemiological  information.  Fact Sheet for Patients:   RoadLapTop.co.za  Fact Sheet for Healthcare Providers: http://kim-miller.com/  This test is not yet approved or  cleared by the [place] FDA and has been authorized for detection and/or diagnosis of SARS-CoV-2 by FDA under an Emergency Use Authorization (EUA).  This EUA will remain in effect (meaning this test can be used) for the duration of the COVID-19 declaration under Section 564(b)(1) of the Act, 21 U.S.C. section 360bbb-3(b)(1), unless the authorization is terminated or revoked sooner.  Performed at [facility], [address] [place], [place] [address]   Respiratory (~20 pathogens) panel by PCR     Status: None   Collection Time: 04/23/22 12:01 PM   Specimen: Nasopharyngeal Swab; Respiratory  Result Value Ref Range Status   Adenovirus NOT DETECTED NOT DETECTED Final   Coronavirus 229E NOT DETECTED NOT DETECTED Final    Comment: (NOTE) The Coronavirus on the Respiratory Panel, DOES NOT test for the novel  Coronavirus (2019 nCoV)    Coronavirus HKU1 NOT DETECTED NOT DETECTED Final   Coronavirus NL63 NOT DETECTED NOT DETECTED Final   Coronavirus OC43 NOT DETECTED NOT DETECTED Final   Metapneumovirus NOT DETECTED NOT DETECTED Final   Rhinovirus / Enterovirus NOT DETECTED NOT DETECTED Final   Influenza A NOT DETECTED NOT DETECTED Final   Influenza B NOT DETECTED NOT DETECTED Final   Parainfluenza Virus 1 NOT DETECTED NOT DETECTED Final   Parainfluenza Virus 2 NOT DETECTED NOT DETECTED Final   Parainfluenza Virus 3 NOT DETECTED NOT DETECTED Final   Parainfluenza Virus 4 NOT DETECTED NOT DETECTED Final   Respiratory Syncytial Virus NOT DETECTED NOT DETECTED Final   Bordetella pertussis NOT DETECTED NOT DETECTED Final   Bordetella Parapertussis NOT DETECTED NOT DETECTED Final   Chlamydophila pneumoniae NOT DETECTED NOT DETECTED Final    Mycoplasma pneumoniae NOT DETECTED NOT DETECTED Final    Comment: Performed at [facility], [address] [place], [place] [address]     Labs: BNP (last 3 results) No results for input(s): "BNP" in the last 8760 hours. Basic Metabolic Panel: Recent Labs  Lab 04/23/22 0813 04/24/22 0344  NA 136 133*  K 3.2* 4.1  CL 95* 95*  CO2 30 27  GLUCOSE 108* 192*  BUN 10 12  CREATININE 0.98 0.85  CALCIUM 8.6* 8.8*  MG 1.7  --    Liver Function Tests: No results for input(s): "AST", "ALT", "ALKPHOS", "BILITOT", "PROT", "ALBUMIN" in the last 168 hours. No results for input(s): "LIPASE", "AMYLASE" in the last 168 hours. No results for input(s): "AMMONIA" in the last 168 hours. CBC: Recent Labs  Lab 04/23/22 0813 04/24/22 0344  WBC 8.1 6.0  HGB 11.8* 10.9*  HCT 37.4 34.5*  MCV 102.7* 101.8*  PLT 300 263   Cardiac Enzymes: No results for input(s): "CKTOTAL", "CKMB", "CKMBINDEX", "TROPONINI" in the last 168 hours. BNP: Invalid input(s): "POCBNP" CBG: No results for input(s): "GLUCAP" in the last 168 hours. D-Dimer No results for input(s): "DDIMER" in the last 72 hours. Hgb A1c No results for input(s): "HGBA1C" in the last 72 hours. Lipid Profile No results for input(s): "CHOL", "HDL", "LDLCALC", "TRIG", "CHOLHDL", "LDLDIRECT" in the last 72 hours. Thyroid function studies No results for input(s): "TSH", "T4TOTAL", "T3FREE", "THYROIDAB" in the last 72 hours.  Invalid input(s): "FREET3" Anemia work up No results for input(s): "VITAMINB12", "FOLATE", "FERRITIN", "TIBC", "IRON", "RETICCTPCT" in the last 72 hours. Urinalysis    Component Value Date/Time   COLORURINE YELLOW 12/11/2019  0845   APPEARANCEUR CLEAR 12/11/2019 0845   LABSPEC 1.013 12/11/2019 0845   PHURINE 7.0 12/11/2019 0845   GLUCOSEU NEGATIVE 12/11/2019 0845   HGBUR SMALL (A) 12/11/2019 0845   BILIRUBINUR NEGATIVE 12/11/2019 0845   KETONESUR NEGATIVE 12/11/2019 0845   PROTEINUR NEGATIVE 12/11/2019 0845    UROBILINOGEN 0.2 12/14/2012 0541   NITRITE NEGATIVE 12/11/2019 0845   LEUKOCYTESUR MODERATE (A) 12/11/2019 0845   Sepsis Labs Recent Labs  Lab 04/23/22 0813 04/24/22 0344  WBC 8.1 6.0   Microbiology Recent Results (from the past 240 hour(s))  SARS Coronavirus 2 by RT PCR (hospital order, performed in [facility] hospital lab) *cepheid single result test* Nasopharyngeal Swab     Status: None   Collection Time: 04/23/22 12:01 PM   Specimen: Nasopharyngeal Swab; Nasal Swab  Result Value Ref Range Status   SARS Coronavirus 2 by RT PCR NEGATIVE NEGATIVE Final    Comment: (NOTE) SARS-CoV-2 target nucleic acids are NOT DETECTED.  The SARS-CoV-2 RNA is generally detectable in upper and lower respiratory specimens during the acute phase of infection. The lowest concentration of SARS-CoV-2 viral copies this assay can detect is 250 copies / mL. A negative result does not preclude SARS-CoV-2 infection and should not be used as the sole basis for treatment or other patient management decisions.  A negative result may occur with improper specimen collection / handling, submission of specimen other than nasopharyngeal swab, presence of viral mutation(s) within the areas targeted by this assay, and inadequate number of viral copies (<250 copies / mL). A negative result must be combined with clinical observations, patient history, and epidemiological information.  Fact Sheet for Patients:   RoadLapTop.co.za  Fact Sheet for Healthcare Providers: http://kim-miller.com/  This test is not yet approved or  cleared by the [place] FDA and has been authorized for detection and/or diagnosis of SARS-CoV-2 by FDA under an Emergency Use Authorization (EUA).  This EUA will remain in effect (meaning this test can be used) for the duration of the COVID-19 declaration under Section 564(b)(1) of the Act, 21 U.S.C. section 360bbb-3(b)(1), unless the  authorization is terminated or revoked sooner.  Performed at [facility], [address] [place], [place] [address]   Respiratory (~20 pathogens) panel by PCR     Status: None   Collection Time: 04/23/22 12:01 PM   Specimen: Nasopharyngeal Swab; Respiratory  Result Value Ref Range Status   Adenovirus NOT DETECTED NOT DETECTED Final   Coronavirus 229E NOT DETECTED NOT DETECTED Final    Comment: (NOTE) The Coronavirus on the Respiratory Panel, DOES NOT test for the novel  Coronavirus (2019 nCoV)    Coronavirus HKU1 NOT DETECTED NOT DETECTED Final   Coronavirus NL63 NOT DETECTED NOT DETECTED Final   Coronavirus OC43 NOT DETECTED NOT DETECTED Final   Metapneumovirus NOT DETECTED NOT DETECTED Final   Rhinovirus / Enterovirus NOT DETECTED NOT DETECTED Final   Influenza A NOT DETECTED NOT DETECTED Final   Influenza B NOT DETECTED NOT DETECTED Final   Parainfluenza Virus 1 NOT DETECTED NOT DETECTED Final   Parainfluenza Virus 2 NOT DETECTED NOT DETECTED Final   Parainfluenza Virus 3 NOT DETECTED NOT DETECTED Final   Parainfluenza Virus 4 NOT DETECTED NOT DETECTED Final   Respiratory Syncytial Virus NOT DETECTED NOT DETECTED Final   Bordetella pertussis NOT DETECTED NOT DETECTED Final   Bordetella Parapertussis NOT DETECTED NOT DETECTED Final   Chlamydophila pneumoniae NOT DETECTED NOT DETECTED Final   Mycoplasma pneumoniae NOT DETECTED NOT DETECTED  Final    Comment: Performed at [facility], [address], [place], [place] [address]     Time coordinating discharge: Over 30 minutes  SIGNED:   Alvira Philips [place], DO  [facility] 04/25/2022, 9:58 AM

## 2022-04-25 NOTE — Progress Notes (Signed)
Walk test conducted by writer, O2 sats stayed at 96% on RA during walk. O2 removed from pt.

## 2022-07-06 ENCOUNTER — Other Ambulatory Visit: Payer: Self-pay | Admitting: Family Medicine

## 2022-07-06 DIAGNOSIS — Z1231 Encounter for screening mammogram for malignant neoplasm of breast: Secondary | ICD-10-CM

## 2022-07-18 ENCOUNTER — Other Ambulatory Visit: Payer: Self-pay | Admitting: Hematology and Oncology

## 2022-07-18 DIAGNOSIS — C8331 Diffuse large B-cell lymphoma, lymph nodes of head, face, and neck: Secondary | ICD-10-CM

## 2022-07-18 NOTE — Progress Notes (Unsigned)
[facility] [phone]   [phone]  PROGRESS NOTE  Patient Care Team: Richmond Campbell., PA-C as PCP - General (Family Medicine) Marykay Lex, MD as PCP - Cardiology (Cardiology) Lonie Peak, MD as Consulting Physician (Radiation Oncology) Malmfelt, Lise Auer, RN as Oncology Nurse Navigator Jaci Standard, MD as Consulting Physician (Hematology and Oncology)  Hematological/Oncological History # Diffuse Large B Cell Lymphoma, Stage II 1) 09/26/2019: CT scan of neck performed due to persistent tonsillar lesion after outpatient antibiotic course. CT scan showed a heterogeneous right tonsillar mass up to 3.2 cm and a small but suspicious right level 2A lymph node  2) 09/30/2019: patient underwent biopsy with ENT which revealed a DLBCL with FISH for MYC, BCL2 and BCL6 pending 3) 10/08/2019: establish care with Dr. Leonides Schanz  4) 10/21/2019: PET scan showed hypermetabolic right tonsillar mass and adjacent right level 2 adenopathy, consistent with the given history of lymphoma. 5) 11/13/2019: Cycle 1 Day 1 of R-miniCHOP 6) 12/05/2019: Cycle 2 Day 1 of R-miniCHOP 7) 12/25/2019: Cycle 3 Day 1 of R-miniCHOP 8) 02/04/2020-02/27/2020: radiation therapy.  9) 12/27/2020: CT Chest/Ab/Pelvis/Neck showed no evidence of disease recurrence/progression.  10) 01/07/2022: CT Chest/Ab/Pelvis/Neck showed no evidence of disease recurrence/progression.   Interval History:  Amy SANDIFER 87 y.o. female with medical history significant for Stage II DLBCL of the tonsil who presents for a follow up visit. The patient's last visit was on 01/16/2022. In the interim since the last visit she has had no major changes in her health.  On exam today Amy [name] reports she had a good 4 July with fireworks and barbecue.  She notes that overall in the last 6 months she has been well other than some bladder issues.  She reports she had these bladder issues back in the 80s and 90s but they are  now coming back.  She follows with urology.  She reports that she is taking medication to help "her bladder relax".  She notes that she feels like she might be having spasms in her bladder.  Other than her bladder she is not having any other infectious symptoms.  She denies any runny nose, sore throat, or cough.  She is not having any bumps or lumps in her neck, underarms, or groin.  She notes her energy is strong and her appetite is good.  She is on some Lasix for swelling of her lower extremities which are swelling equally bilaterally.  She currently denies any fevers, chills, sweats, nausea, or diarrhea.  She denies any palpable lymphadenopathy.  Otherwise she reports that she feels well.  A full 10 point ROS is listed below.  MEDICAL HISTORY:  Past Medical History:  Diagnosis Date   Anxiety    Arthritis    COPD (chronic obstructive pulmonary disease) (HCC)    Esophageal stricture    s/p repeated dilations, Dr. Ewing Schlein   GERD (gastroesophageal reflux disease)    Hearing loss bilateral   has hearing aids   Heart murmur    With no gross abnormalities on echocardiogram.   Hiatal hernia    History of blood transfusion    many years ago    Hyperlipidemia    Hypothyroidism    large b cell lymphoma 04/2019   Osteoarthritis    Osteoporosis    Paroxysmal atrial fibrillation (HCC)    1 confirm documented episode of A. fib; not on anticoagulation.   Pulmonary nodules    Renal lesion    1cm left kidney  SURGICAL HISTORY: Past Surgical History:  Procedure Laterality Date   ABDOMINAL HYSTERECTOMY     BREAST BIOPSY Left 10/19/2015   BREAST ENHANCEMENT SURGERY  01/10/2004   CATARACT EXTRACTION W/ INTRAOCULAR LENS  IMPLANT, BILATERAL  '09   ESOPHAGEAL DILATION     IR IMAGING GUIDED PORT INSERTION  11/05/2019   IR REMOVAL TUN ACCESS W/ PORT W/O FL MOD SED  05/03/2020   KNEE ARTHROSCOPY  02/22/2011   Procedure: ARTHROSCOPY KNEE;  Surgeon: Loanne Drilling, MD;  Location: [facility];  Service: Orthopedics;  Laterality: Right;  WITH DEBRIDEMENT    LAPAROSCOPIC SMALL BOWEL RESECTION N/A 06/02/2016   Procedure: DIAGNOSTIC LAPAROSCOPY SMALL BOWEL RESECTION;  Surgeon: Darnell Level, MD;  Location: [facility];  Service: General;  Laterality: N/A;   TRANSTHORACIC ECHOCARDIOGRAM  03/24/2016   a) (In setting of COPD exacerbation-pneumonia) EF 60 to 65%. ~Appear to be in A. Fib.  Unable to assess diastolic function.  Normal wall motion.  Mild LA dilation.  Trivial MR.  Elevated PAP estimated 53 mmHg, RAP 15 mm.; b) 07/29/2018: Vigorous LV function, EF> 65%.  Normal wall motion.  GR 1 DD with elevated LAP.  Normal RV size and function.  Normal valves.  Trivial MR.   TRANSTHORACIC ECHOCARDIOGRAM  10/2019   EF 60 to 65%.  Normal LV size and function.  No R WMA.  GR 1 DD.  Normal PA/RVP with normal RV.  Normal RAP.  Grossly normal aortic and mitral valves.  No significant change from prior study.   Zio Patch Monitor-back-to-back 14 days (28-day total)  12/2020   Predominantly SR: HR range 47-102 bpm, AVG 63 bpm.  Rare PACs and PVCs.  9 atrial runs: Fastest 8 beats 139 bpm, longest 9 beats 90 bpm.  No sustained arrhythmias either fast or slow.  No pauses.  Symptoms noted with sinus rhythm and occasionally with sinus rhythm associated with PACs or PVCs.    SOCIAL HISTORY: Social History   Socioeconomic History   Marital status: Widowed    Spouse name: Not on file   Number of children: 3   Years of education: 46   Highest education level: Not on file  Occupational History   Not on file  Tobacco Use   Smoking status: Former    Packs/day: 1.00    Years: 30.00    Additional pack years: 0.00    Total pack years: 30.00    Types: Cigarettes    Quit date: 01/09/1997    Years since quitting: 25.5   Smokeless tobacco: Never  Vaping Use   Vaping Use: Never used  Substance and Sexual Activity   Alcohol use: No   Drug use: No   Sexual activity: Not Currently  Other Topics Concern    Not on file  Social History Narrative   Right handed    Live alone   Caffeine use: 1 cup coffee every morning   Social Determinants of Health   Financial Resource Strain: Not on file  Food Insecurity: No Food Insecurity (04/23/2022)   Hunger Vital Sign    Worried About Running Out of Food in the Last Year: Never true    Ran Out of Food in the Last Year: Never true  Transportation Needs: No Transportation Needs (04/23/2022)   PRAPARE - Administrator, Civil Service (Medical): No    Lack of Transportation (Non-Medical): No  Physical Activity: Not on file  Stress: Not on file  Social Connections: Not on file  Intimate Partner Violence: Not At Risk (04/23/2022)   Humiliation, Afraid, Rape, and Kick questionnaire    Fear of Current or Ex-Partner: No    Emotionally Abused: No    Physically Abused: No    Sexually Abused: No    FAMILY HISTORY: Family History  Problem Relation Age of Onset   Heart disease Father    Rheum arthritis Father    Heart failure Mother    Diabetes Brother     ALLERGIES:  is allergic to other and codeine.  MEDICATIONS:  Current Outpatient Medications  Medication Sig Dispense Refill   albuterol (VENTOLIN HFA) 108 (90 Base) MCG/ACT inhaler Inhale 2 puffs into the lungs every 4 (four) hours as needed for wheezing or shortness of breath.     allopurinol (ZYLOPRIM) 300 MG tablet Take 300 mg by mouth daily.     ALPRAZolam (XANAX) 0.25 MG tablet Take 0.25 mg by mouth 2 (two) times daily.     amLODipine (NORVASC) 5 MG tablet Take 5 mg by mouth daily.     aspirin EC 81 MG tablet Take 81 mg by mouth at bedtime.     atorvastatin (LIPITOR) 20 MG tablet Take 20 mg by mouth at bedtime.  11   Cholecalciferol (VITAMIN D3) 2000 UNITS TABS Take 2,000 Units by mouth at bedtime.      fluticasone (FLONASE) 50 MCG/ACT nasal spray Administer 2 sprays in each nostril daily.     furosemide (LASIX) 40 MG tablet Take 1 tablet (40 mg total) by mouth daily. 30 tablet 2    gabapentin (NEURONTIN) 100 MG capsule TAKE 1 CAP EVERY MORNING, 1 CAP IN THE AFTERNOON, AND 2 CAPS EVERY DAY AT BEDTIME (Patient taking differently: Take 100-200 mg by mouth See admin instructions. Take 100mg  by mouth every morning, 100mg  by mouth in the afternoon and 200mg  by mouth daily at bedtime.) 360 capsule 2   levothyroxine (SYNTHROID) 88 MCG tablet Take 88 mcg by mouth daily before breakfast.     metoprolol tartrate (LOPRESSOR) 25 MG tablet Take 25 mg by mouth 2 (two) times daily.     mirtazapine (REMERON) 7.5 MG tablet Take 7.5 mg by mouth at bedtime.     mometasone-formoterol (DULERA) 200-5 MCG/ACT AERO Inhale 2 puffs into the lungs 2 (two) times daily. 1 each 2   mupirocin ointment (BACTROBAN) 2 % Apply 1 Application topically 2 (two) times daily.     neomycin-polymyxin b-dexamethasone (MAXITROL) 3.5-10000-0.1 SUSP Place 1 drop into the left eye in the morning, at noon, in the evening, and at bedtime.     pantoprazole (PROTONIX) 40 MG tablet Take 40 mg by mouth 2 (two) times daily.   11   Potassium 99 MG TABS Take 99 mg by mouth at bedtime.     traMADol (ULTRAM) 50 MG tablet Take 50 mg by mouth 2 (two) times daily as needed for moderate pain.      vitamin B-12 (CYANOCOBALAMIN) 1000 MCG tablet Take 1,000 mcg by mouth daily.     No current facility-administered medications for this visit.    REVIEW OF SYSTEMS:   Constitutional: ( - ) fevers, ( - )  chills , ( - ) night sweats Eyes: ( - ) blurriness of vision, ( - ) double vision, ( - ) watery eyes Ears, nose, mouth, throat, and face: ( - ) mucositis, ( - ) sore throat Respiratory: ( - ) cough, ( - ) dyspnea, ( - ) wheezes Cardiovascular: ( - ) palpitation, ( - ) chest discomfort, ( - )  lower extremity swelling Gastrointestinal:  ( - ) nausea, ( - ) heartburn, ( - ) change in bowel habits Skin: ( - ) abnormal skin rashes Lymphatics: ( - ) new lymphadenopathy, ( - ) easy bruising Neurological: ( - ) numbness, ( - ) tingling, ( - ) new  weaknesses Behavioral/Psych: ( - ) mood change, ( - ) new changes  All other systems were reviewed with the patient and are negative.  PHYSICAL EXAMINATION: ECOG PERFORMANCE STATUS: 2 - Symptomatic, <50% confined to bed  Vitals:   07/19/22 1003  BP: 120/71  Pulse: 62  Resp: 14  Temp: 97.9 F (36.6 C)  SpO2: 91%     Filed Weights   07/19/22 1003  Weight: 143 lb 6.4 oz (65 kg)      GENERAL: well appearing elderly Caucasian female in NAD  SKIN: skin color, texture, turgor are normal, no rashes or significant lesions ENT: erythema at right back tonsil, but no overt enlargement or concern for recurrence.  EYES: conjunctiva are pink and non-injected, sclera clear LUNGS: clear to auscultation and percussion with normal breathing effort HEART: regular rate & rhythm and no murmurs and no lower extremity edema Musculoskeletal: no cyanosis of digits and no clubbing  PSYCH: alert & oriented x 3, fluent speech NEURO: no focal motor/sensory deficits  LABORATORY DATA:  I have reviewed the data as listed    Latest Ref Rng & Units 07/19/2022    9:26 AM 04/24/2022    3:44 AM 04/23/2022    8:13 AM  CBC  WBC 4.0 - 10.5 K/uL 8.4  6.0  8.1   Hemoglobin 12.0 - 15.0 g/dL 16.1  09.6  04.5   Hematocrit 36.0 - 46.0 % 37.3  34.5  37.4   Platelets 150 - 400 K/uL 340  263  300        Latest Ref Rng & Units 07/19/2022    9:26 AM 04/24/2022    3:44 AM 04/23/2022    8:13 AM  CMP  Glucose 70 - 99 mg/dL 89  409  811   BUN 8 - 23 mg/dL 7  12  10    Creatinine 0.44 - 1.00 mg/dL 9.14  7.82  9.56   Sodium 135 - 145 mmol/L 141  133  136   Potassium 3.5 - 5.1 mmol/L 3.8  4.1  3.2   Chloride 98 - 111 mmol/L 98  95  95   CO2 22 - 32 mmol/L 37  27  30   Calcium 8.9 - 10.3 mg/dL 21.3  8.8  8.6   Total Protein 6.5 - 8.1 g/dL 7.3     Total Bilirubin 0.3 - 1.2 mg/dL 0.8     Alkaline Phos 38 - 126 U/L 117     AST 15 - 41 U/L 19     ALT 0 - 44 U/L 10      RADIOGRAPHIC STUDIES: No results  found.  ASSESSMENT & PLAN NASTASSIA TEIGLAND 87 y.o. female with medical history significant for Stage II DLBCL of the tonsil who presents for a follow up visit.    After review the labs, the records, discussion with the patient the findings most consistent with a stage II diffuse large B-cell lymphoma of the tonsil.  She has involvement of the right tonsil as well as axillary lymph nodes which would constitute stage II disease.  She does have some mild activity in other lymph nodes throughout the body, but nowhere near as active as the ones within her  head neck.  Lymphomas involvement of these lymph nodes is not likely and therefore I would proceed with treatment as if the patient were a stage II.  Previously we discussed the diagnosis of diffuse large B-cell lymphoma and the treatment options moving forward.  The regimen most recommended for this patient would be R mini CHOP with consideration for 3 cycles followed by radiation to the local lymph nodes.  This is consistent with the treatment course to be recommended with R-CHOP chemotherapy for stage II disease.  We are still currently waiting for the results of the double hit panel, however this would not likely change management as the patient would not be able to tolerate full strength chemotherapy with something like R-EPOCH.  We also discussed the risks and benefits of this R-CHOP chemotherapy including constipation, neurotoxicity, cardiac toxicity, drop in blood counts, fatigue, nausea, vomiting, and alopecia.  The patient voiced her understanding of these complications and wished to proceed forward with treatment.  The regimen of R- miniCHOP consists of rituximab 325 mg per metered squared, cyclophosphamide 400 mg per metered squared IV, doxorubicin 25 mg per metered squared IV, vincristine 1 mg IV, and prednisone 40 mg per metered squared p.o. on days 1 through 5.  This will be pursued for 3 cycles of chemotherapy with the addition of radiation therapy.   Based on NCCN recommendations, member institutes do practice this regimen (short course chemo + RT), though data is not robust.  # Diffuse Large B Cell Lymphoma, Stage II --complete response noted on PET CT scan on 01/08/2020. No evidence of recurrent on CT scans from 01/05/2022.  --completed 3 cycles of R-mini-CHOP followed by localized radiation.  -- TTE shows EF 60-65% with mild diastolic dysfunction --patient has established care with Dr. Basilio Cairo and underwent radiation therapy which ended on 02/27/2020.  -- Labs today show white blood cell count 8.4, hemoglobin 12.2, MCV 100, and platelets of 340.  Creatinine and LFTs are both within normal limits. --repeat CT scan can be performed as clinically indicated moving forward --RTC in 6 months time for repeat clinic visit.   No orders of the defined types were placed in this encounter.  All questions were answered. The patient knows to call the clinic with any problems, questions or concerns.  A total of more than 30 minutes were spent on this encounter and over half of that time was spent on counseling and coordination of care as outlined above.   Ulysees Barns, MD Department of Hematology/Oncology [facility] Phone: [phone] Pager: [phone] Email: [email or web address]  07/19/2022 11:33 AM

## 2022-07-19 ENCOUNTER — Other Ambulatory Visit: Payer: Self-pay

## 2022-07-19 ENCOUNTER — Inpatient Hospital Stay: Payer: Medicare Other | Attending: Hematology and Oncology

## 2022-07-19 ENCOUNTER — Inpatient Hospital Stay: Payer: Medicare Other | Admitting: Hematology and Oncology

## 2022-07-19 VITALS — BP 120/71 | HR 62 | Temp 97.9°F | Resp 14 | Wt 143.4 lb

## 2022-07-19 DIAGNOSIS — Z8572 Personal history of non-Hodgkin lymphomas: Secondary | ICD-10-CM | POA: Diagnosis present

## 2022-07-19 DIAGNOSIS — Z7982 Long term (current) use of aspirin: Secondary | ICD-10-CM | POA: Insufficient documentation

## 2022-07-19 DIAGNOSIS — C8331 Diffuse large B-cell lymphoma, lymph nodes of head, face, and neck: Secondary | ICD-10-CM | POA: Diagnosis not present

## 2022-07-19 DIAGNOSIS — Z923 Personal history of irradiation: Secondary | ICD-10-CM | POA: Insufficient documentation

## 2022-07-19 DIAGNOSIS — Z95828 Presence of other vascular implants and grafts: Secondary | ICD-10-CM

## 2022-07-19 DIAGNOSIS — Z87891 Personal history of nicotine dependence: Secondary | ICD-10-CM | POA: Insufficient documentation

## 2022-07-19 DIAGNOSIS — Z9221 Personal history of antineoplastic chemotherapy: Secondary | ICD-10-CM | POA: Insufficient documentation

## 2022-07-19 DIAGNOSIS — Z79899 Other long term (current) drug therapy: Secondary | ICD-10-CM | POA: Insufficient documentation

## 2022-07-19 LAB — CMP (CANCER CENTER ONLY)
ALT: 10 U/L (ref 0–44)
AST: 19 U/L (ref 15–41)
Albumin: 4 g/dL (ref 3.5–5.0)
Alkaline Phosphatase: 117 U/L (ref 38–126)
Anion gap: 6 (ref 5–15)
BUN: 7 mg/dL — ABNORMAL LOW (ref 8–23)
CO2: 37 mmol/L — ABNORMAL HIGH (ref 22–32)
Calcium: 10 mg/dL (ref 8.9–10.3)
Chloride: 98 mmol/L (ref 98–111)
Creatinine: 0.69 mg/dL (ref 0.44–1.00)
GFR, Estimated: >60 mL/min (ref >60)
Glucose, Bld: 89 mg/dL (ref 70–99)
Potassium: 3.8 mmol/L (ref 3.5–5.1)
Sodium: 141 mmol/L (ref 135–145)
Total Bilirubin: 0.8 mg/dL (ref 0.3–1.2)
Total Protein: 7.3 g/dL (ref 6.5–8.1)

## 2022-07-19 LAB — CBC WITH DIFFERENTIAL (CANCER CENTER ONLY)
Abs Immature Granulocytes: 0.02 10*3/uL (ref 0.00–0.07)
Basophils Absolute: 0.1 10*3/uL (ref 0.0–0.1)
Basophils Relative: 1 %
Eosinophils Absolute: 0.2 10*3/uL (ref 0.0–0.5)
Eosinophils Relative: 2 %
HCT: 37.3 % (ref 36.0–46.0)
Hemoglobin: 12.2 g/dL (ref 12.0–15.0)
Immature Granulocytes: 0 %
Lymphocytes Relative: 44 %
Lymphs Abs: 3.7 10*3/uL (ref 0.7–4.0)
MCH: 32.7 pg (ref 26.0–34.0)
MCHC: 32.7 g/dL (ref 30.0–36.0)
MCV: 100 fL (ref 80.0–100.0)
Monocytes Absolute: 0.7 10*3/uL (ref 0.1–1.0)
Monocytes Relative: 9 %
Neutro Abs: 3.8 10*3/uL (ref 1.7–7.7)
Neutrophils Relative %: 44 %
Platelet Count: 340 10*3/uL (ref 150–400)
RBC: 3.73 MIL/uL — ABNORMAL LOW (ref 3.87–5.11)
RDW: 14.1 % (ref 11.5–15.5)
WBC Count: 8.4 10*3/uL (ref 4.0–10.5)
nRBC: 0 % (ref 0.0–0.2)

## 2022-07-19 LAB — LACTATE DEHYDROGENASE: LDH: 172 U/L (ref 98–192)

## 2022-08-01 ENCOUNTER — Ambulatory Visit: Payer: Medicare Other

## 2022-08-01 DIAGNOSIS — Z1231 Encounter for screening mammogram for malignant neoplasm of breast: Secondary | ICD-10-CM

## 2022-08-21 ENCOUNTER — Emergency Department (HOSPITAL_COMMUNITY): Payer: Medicare Other

## 2022-08-21 ENCOUNTER — Emergency Department (HOSPITAL_COMMUNITY): Admission: EM | Admit: 2022-08-21 | Payer: Medicare Other | Source: Home / Self Care

## 2022-08-21 DIAGNOSIS — I10 Essential (primary) hypertension: Secondary | ICD-10-CM | POA: Diagnosis not present

## 2022-08-21 DIAGNOSIS — Z79899 Other long term (current) drug therapy: Secondary | ICD-10-CM | POA: Diagnosis not present

## 2022-08-21 DIAGNOSIS — R110 Nausea: Secondary | ICD-10-CM

## 2022-08-21 DIAGNOSIS — R350 Frequency of micturition: Secondary | ICD-10-CM

## 2022-08-21 DIAGNOSIS — J449 Chronic obstructive pulmonary disease, unspecified: Secondary | ICD-10-CM | POA: Diagnosis not present

## 2022-08-21 DIAGNOSIS — Z20822 Contact with and (suspected) exposure to covid-19: Secondary | ICD-10-CM | POA: Diagnosis not present

## 2022-08-21 DIAGNOSIS — R748 Abnormal levels of other serum enzymes: Secondary | ICD-10-CM | POA: Insufficient documentation

## 2022-08-21 DIAGNOSIS — D7589 Other specified diseases of blood and blood-forming organs: Secondary | ICD-10-CM

## 2022-08-21 DIAGNOSIS — Z7982 Long term (current) use of aspirin: Secondary | ICD-10-CM | POA: Diagnosis not present

## 2022-08-21 DIAGNOSIS — R7309 Other abnormal glucose: Secondary | ICD-10-CM

## 2022-08-21 LAB — LIPASE, BLOOD: Lipase: 20 U/L (ref 11–51)

## 2022-08-21 LAB — CBC WITH DIFFERENTIAL/PLATELET
Abs Immature Granulocytes: 0.03 10*3/uL (ref 0.00–0.07)
Basophils Absolute: 0 10*3/uL (ref 0.0–0.1)
Basophils Relative: 0 %
Eosinophils Absolute: 0.2 10*3/uL (ref 0.0–0.5)
Eosinophils Relative: 2 %
HCT: 40.1 % (ref 36.0–46.0)
Hemoglobin: 13 g/dL (ref 12.0–15.0)
Immature Granulocytes: 0 %
Lymphocytes Relative: 51 %
Lymphs Abs: 5.4 10*3/uL — ABNORMAL HIGH (ref 0.7–4.0)
MCH: 32.6 pg (ref 26.0–34.0)
MCHC: 32.4 g/dL (ref 30.0–36.0)
MCV: 100.5 fL — ABNORMAL HIGH (ref 80.0–100.0)
Monocytes Absolute: 0.8 10*3/uL (ref 0.1–1.0)
Monocytes Relative: 8 %
Neutro Abs: 4 10*3/uL (ref 1.7–7.7)
Neutrophils Relative %: 39 %
Platelets: 322 10*3/uL (ref 150–400)
RBC: 3.99 MIL/uL (ref 3.87–5.11)
RDW: 13.9 % (ref 11.5–15.5)
WBC: 10.4 10*3/uL (ref 4.0–10.5)
nRBC: 0 % (ref 0.0–0.2)

## 2022-08-21 LAB — COMPREHENSIVE METABOLIC PANEL
ALT: 13 U/L (ref 0–44)
AST: 24 U/L (ref 15–41)
Albumin: 4.4 g/dL (ref 3.5–5.0)
Alkaline Phosphatase: 128 U/L — ABNORMAL HIGH (ref 38–126)
Anion gap: 13 (ref 5–15)
BUN: 7 mg/dL — ABNORMAL LOW (ref 8–23)
CO2: 32 mmol/L (ref 22–32)
Calcium: 10.1 mg/dL (ref 8.9–10.3)
Chloride: 92 mmol/L — ABNORMAL LOW (ref 98–111)
Creatinine, Ser: 0.66 mg/dL (ref 0.44–1.00)
GFR, Estimated: >60 mL/min (ref >60)
Glucose, Bld: 108 mg/dL — ABNORMAL HIGH (ref 70–99)
Potassium: 3.8 mmol/L (ref 3.5–5.1)
Sodium: 137 mmol/L (ref 135–145)
Total Bilirubin: 0.8 mg/dL (ref 0.3–1.2)
Total Protein: 7.9 g/dL (ref 6.5–8.1)

## 2022-08-21 LAB — SARS CORONAVIRUS 2 BY RT PCR: SARS Coronavirus 2 by RT PCR: NEGATIVE

## 2022-08-21 MED ORDER — ALBUTEROL SULFATE HFA 108 (90 BASE) MCG/ACT IN AERS
2.0000 | INHALATION_SPRAY | Freq: Once | RESPIRATORY_TRACT | Status: DC
  Filled 2022-08-21: qty 6.7

## 2022-08-21 NOTE — ED Triage Notes (Signed)
Patient arrived POV. Patient reports nausea for a few days. Reports dysuria, frequency x a month. AAOX4, respirations even and unlabored. NAD. Denies any other symptoms

## 2022-08-21 NOTE — ED Provider Triage Note (Signed)
Emergency Medicine Provider Triage Evaluation Note  Amy [name] , a 87 y.o. female  was evaluated in triage.  Pt complains of nausea, feeling unwell. Went to PCP, told dehydrated with poss UTI. Hx of lymphoma in remission. No fever, CP, abd pain. Some cough  Some SOB when exerting. No home oxygen. Hx of COPD  Review of Systems  Positive: Nausea, weakness Negative: fever  Physical Exam  Temp 97.9 F (36.6 C)  Gen:   Awake, no distress   Resp:  Normal effort  MSK:   Moves extremities without difficulty  Other:    Medical Decision Making  Medically screening exam initiated at 3:26 PM.  Appropriate orders placed.  Harrie Jeans was informed that the remainder of the evaluation will be completed by another provider, this initial triage assessment does not replace that evaluation, and the importance of remaining in the ED until their evaluation is complete.  Weakness, nausea, cough   ,  A, PA-C 08/21/22 1529

## 2022-08-22 NOTE — ED Provider Notes (Signed)
[place] EMERGENCY DEPARTMENT AT [facility] Provider Note   CSN: [id number] Arrival date & time: 08/21/22  1509     History  Chief Complaint  Patient presents with   Nausea   Dysuria         Amy [name] is a 87 y.o. female.  The history is provided by the patient.  Dysuria She has history of hypertension, hyperlipidemia, COPD and comes in because of urinary frequency for the last month.  She denies dysuria and tenesmus.  She has urinary urgency at baseline and it has not changed.  She denies fever or chills.  She has also been having nausea for the last several days.  She does take ondansetron on a as needed basis and it has been effective when she has taken it.  She states that she has been treated for hyperactive bladder in the past and that the usual medications have not been helpful.  She does have an appointment with her urologist.   Home Medications Prior to Admission medications   Medication Sig Start Date End Date Taking? Authorizing Provider  albuterol (VENTOLIN HFA) 108 (90 Base) MCG/ACT inhaler Inhale 2 puffs into the lungs every 4 (four) hours as needed for wheezing or shortness of breath. 11/11/20   [provider]  allopurinol (ZYLOPRIM) 300 MG tablet Take 300 mg by mouth daily.    [provider]  ALPRAZolam Prudy Feeler) 0.25 MG tablet Take 0.25 mg by mouth 2 (two) times daily.    [provider]  amLODipine (NORVASC) 5 MG tablet Take 5 mg by mouth daily. 09/12/19   [provider]  aspirin EC 81 MG tablet Take 81 mg by mouth at bedtime.    [provider]  atorvastatin (LIPITOR) 20 MG tablet Take 20 mg by mouth at bedtime.    [provider]  Cholecalciferol (VITAMIN D3) 2000 UNITS TABS Take 2,000 Units by mouth at bedtime.     [provider]  fluticasone (FLONASE) 50 MCG/ACT nasal spray Administer 2 sprays in each nostril daily. 04/13/21   [provider]  furosemide (LASIX) 40 MG tablet  Take 1 tablet (40 mg total) by mouth daily. 04/25/22 07/24/22  [place], Alvira Philips, DO  gabapentin (NEURONTIN) 100 MG capsule TAKE 1 CAP EVERY MORNING, 1 CAP IN THE AFTERNOON, AND 2 CAPS EVERY DAY AT BEDTIME Patient taking differently: Take 100-200 mg by mouth See admin instructions. Take 100mg  by mouth every morning, 100mg  by mouth in the afternoon and 200mg  by mouth daily at bedtime. 04/15/18   Sater, Pearletha Furl, MD  levothyroxine (SYNTHROID) 88 MCG tablet Take 88 mcg by mouth daily before breakfast. 03/01/21   [provider]  metoprolol tartrate (LOPRESSOR) 25 MG tablet Take 25 mg by mouth 2 (two) times daily.    [provider]  mirtazapine (REMERON) 7.5 MG tablet Take 7.5 mg by mouth at bedtime. 06/23/21   [provider]  mometasone-formoterol (DULERA) 200-5 MCG/ACT AERO Inhale 2 puffs into the lungs 2 (two) times daily. 04/25/22   [place], Alvira Philips, DO  mupirocin ointment (BACTROBAN) 2 % Apply 1 Application topically 2 (two) times daily. 02/01/22   [provider]  neomycin-polymyxin b-dexamethasone (MAXITROL) 3.5-10000-0.1 SUSP Place 1 drop into the left eye in the morning, at noon, in the evening, and at bedtime. 04/20/22   [provider]  pantoprazole (PROTONIX) 40 MG tablet Take 40 mg by mouth 2 (two) times daily.     [provider]  Potassium  99 MG TABS Take 99 mg by mouth at bedtime.    [provider]  traMADol (ULTRAM) 50 MG tablet Take 50 mg by mouth 2 (two) times daily as needed for moderate pain.     [provider]  vitamin B-12 (CYANOCOBALAMIN) 1000 MCG tablet Take 1,000 mcg by mouth daily.    [provider]      Allergies    Other and Codeine    Review of Systems   Review of Systems  Genitourinary:  Positive for dysuria.  All other systems reviewed and are negative.   Physical Exam Updated Vital Signs BP 120/66   Pulse 79   Temp 97.9 F (36.6 C) (Oral)   Resp 17   Ht 5\' 5"  (1.651 m)   Wt 65 kg    SpO2 94%   BMI 23.85 kg/m  Physical Exam Vitals and nursing note reviewed.   87 year old female, resting comfortably and in no acute distress. Vital signs are normal. Oxygen saturation is 94%, which is normal. Head is normocephalic and atraumatic. PERRLA, EOMI. d supple without adenopathy or JVD. Back is nontender and there is no CVA tenderness. Lungs are clear without rales, wheezes, or rhonchi. Chest is nontender. Heart has regular rate and rhythm without murmur. Abdomen is soft, flat, nontender. Extremities have no cyanosis or edema, full range of motion is present. Skin is warm and dry without rash. Neurologic: Mental status is normal, cranial nerves are intact, moves all extremities equally.  ED Results / Procedures / Treatments   Labs (all labs ordered are listed, but only abnormal results are displayed) Labs Reviewed  CBC WITH DIFFERENTIAL/PLATELET - Abnormal; Notable for the following components:      Result Value   MCV 100.5 (*)    Lymphs Abs 5.4 (*)    All other components within normal limits  COMPREHENSIVE METABOLIC PANEL - Abnormal; Notable for the following components:   Chloride 92 (*)    Glucose, Bld 108 (*)    BUN 7 (*)    Alkaline Phosphatase 128 (*)    All other components within normal limits  URINALYSIS, W/ REFLEX TO CULTURE (INFECTION SUSPECTED) - Abnormal; Notable for the following components:   Color, Urine STRAW (*)    Specific Gravity, Urine 1.003 (*)    All other components within normal limits  SARS CORONAVIRUS 2 BY RT PCR  LIPASE, BLOOD    EKG EKG Interpretation Date/Time:  Monday August 21 2022 20:02:05 EDT Ventricular Rate:  77 PR Interval:  179 QRS Duration:  100 QT Interval:  421 QTC Calculation: 477 R Axis:   114  Text Interpretation: Sinus rhythm Right axis deviation When compared with ECG of 04/23/2022, No significant change was found Confirmed by Dione Booze ([id number]) on 08/22/2022 1:24:02 AM  Radiology DG Chest 2 View  Result  Date: 08/21/2022 CLINICAL DATA:  Cough EXAM: CHEST - 2 VIEW COMPARISON:  X-ray 04/23/2022 FINDINGS: Hyperinflation. Stable cardiopericardial silhouette. Calcified aorta. No consolidation, pneumothorax or effusion. No edema. Apical pleural thickening. Eventration of the right hemidiaphragm osteopenia. Surgical clips in the upper abdomen. IMPRESSION: Hyperinflation.  No acute cardiopulmonary disease Electronically Signed   By: Karen Kays M.D.   On: 08/21/2022 16:22    Procedures Procedures    Medications Ordered in ED Medications  albuterol (VENTOLIN HFA) 108 (90 Base) MCG/ACT inhaler 2 puff (has no administration in time range)    ED Course/ Medical Decision Making/ A&P  Medical Decision Making  Urinary frequency, rule out UTI.  Also considered new onset diabetes.  Consider spastic bladder.  Chest x-ray and electrocardiogram were obtained at triage.  Chest x-ray shows no acute cardiopulmonary disease.  Have independently viewed the images, and agree with the radiologist's interpretation.  I have reviewed and interpreted her electrocardiogram and my interpretation is right axis deviation but no acute ST or T changes, unchanged from prior.  I reviewed and interpreted her laboratory tests, my interpretation is normal urinalysis, mild elevation of random glucose is not high enough to account for her urinary symptoms.  At this point, I feel that she most likely has spastic bladder.  However,, given her past history of failing to respond to standard medications, I feel it is most appropriate to defer treatment decisions to her urologist.  She is to keep her appointment with her urologist, return to the emergency department should she develop fever or dysuria.  Final Clinical Impression(s) / ED Diagnoses Final diagnoses:  Urinary frequency  Elevated alkaline phosphatase level  Macrocytosis without anemia  Nausea  Elevated random blood glucose level    Rx / DC  Orders ED Discharge Orders     None         Dione Booze, MD 08/22/22 0129

## 2022-08-22 NOTE — Discharge Instructions (Addendum)
Take your ondansetron as needed for nausea.  Your urine did not show any sign of infection.  Please follow-up with the urologist to see if they have any treatments that can help with your frequent urination.  Return to the emergency department if you have any new or concerning symptoms.

## 2022-11-26 ENCOUNTER — Emergency Department (HOSPITAL_COMMUNITY)
Admission: EM | Admit: 2022-11-26 | Discharge: 2022-11-26 | Disposition: A | Discharge status: Home / Self Care | Payer: Medicare Other | Attending: Emergency Medicine | Admitting: Emergency Medicine

## 2022-11-26 ENCOUNTER — Other Ambulatory Visit: Payer: Self-pay

## 2022-11-26 ENCOUNTER — Emergency Department (HOSPITAL_COMMUNITY): Payer: Medicare Other

## 2022-11-26 DIAGNOSIS — U071 COVID-19: Secondary | ICD-10-CM | POA: Insufficient documentation

## 2022-11-26 DIAGNOSIS — Z79899 Other long term (current) drug therapy: Secondary | ICD-10-CM | POA: Diagnosis not present

## 2022-11-26 DIAGNOSIS — R0602 Shortness of breath: Secondary | ICD-10-CM | POA: Insufficient documentation

## 2022-11-26 DIAGNOSIS — Z7982 Long term (current) use of aspirin: Secondary | ICD-10-CM | POA: Insufficient documentation

## 2022-11-26 DIAGNOSIS — R531 Weakness: Secondary | ICD-10-CM | POA: Diagnosis present

## 2022-11-26 LAB — CBC WITH DIFFERENTIAL/PLATELET
Abs Immature Granulocytes: 0.07 10*3/uL (ref 0.00–0.07)
Basophils Absolute: 0 10*3/uL (ref 0.0–0.1)
Basophils Relative: 0 %
Eosinophils Absolute: 0.1 10*3/uL (ref 0.0–0.5)
Eosinophils Relative: 1 %
HCT: 38.3 % (ref 36.0–46.0)
Hemoglobin: 13.2 g/dL (ref 12.0–15.0)
Immature Granulocytes: 1 %
Lymphocytes Relative: 31 %
Lymphs Abs: 3.4 10*3/uL (ref 0.7–4.0)
MCH: 33 pg (ref 26.0–34.0)
MCHC: 34.5 g/dL (ref 30.0–36.0)
MCV: 95.8 fL (ref 80.0–100.0)
Monocytes Absolute: 1.2 10*3/uL — ABNORMAL HIGH (ref 0.1–1.0)
Monocytes Relative: 11 %
Neutro Abs: 6.4 10*3/uL (ref 1.7–7.7)
Neutrophils Relative %: 56 %
Platelets: 275 10*3/uL (ref 150–400)
RBC: 4 MIL/uL (ref 3.87–5.11)
RDW: 13.8 % (ref 11.5–15.5)
WBC: 11.2 10*3/uL — ABNORMAL HIGH (ref 4.0–10.5)
nRBC: 0 % (ref 0.0–0.2)

## 2022-11-26 LAB — URINALYSIS, W/ REFLEX TO CULTURE (INFECTION SUSPECTED)
Bacteria, UA: NONE SEEN
Bilirubin Urine: NEGATIVE
Glucose, UA: NEGATIVE mg/dL
Hgb urine dipstick: NEGATIVE
Ketones, ur: NEGATIVE mg/dL
Leukocytes,Ua: NEGATIVE
Nitrite: NEGATIVE
Protein, ur: NEGATIVE mg/dL
Specific Gravity, Urine: 1.002 — ABNORMAL LOW (ref 1.005–1.030)
pH: 7 (ref 5.0–8.0)

## 2022-11-26 LAB — TROPONIN I (HIGH SENSITIVITY)
Troponin I (High Sensitivity): 11 ng/L (ref <18)
Troponin I (High Sensitivity): 9 ng/L (ref <18)

## 2022-11-26 LAB — RESP PANEL BY RT-PCR (RSV, FLU A&B, COVID)  RVPGX2
Influenza A by PCR: NEGATIVE
Influenza B by PCR: NEGATIVE
Resp Syncytial Virus by PCR: NEGATIVE
SARS Coronavirus 2 by RT PCR: POSITIVE — AB

## 2022-11-26 LAB — COMPREHENSIVE METABOLIC PANEL
ALT: 18 U/L (ref 0–44)
AST: 19 U/L (ref 15–41)
Albumin: 3.6 g/dL (ref 3.5–5.0)
Alkaline Phosphatase: 107 U/L (ref 38–126)
Anion gap: 9 (ref 5–15)
BUN: 11 mg/dL (ref 8–23)
CO2: 31 mmol/L (ref 22–32)
Calcium: 8.9 mg/dL (ref 8.9–10.3)
Chloride: 92 mmol/L — ABNORMAL LOW (ref 98–111)
Creatinine, Ser: 0.74 mg/dL (ref 0.44–1.00)
GFR, Estimated: >60 mL/min (ref >60)
Glucose, Bld: 104 mg/dL — ABNORMAL HIGH (ref 70–99)
Potassium: 2.9 mmol/L — ABNORMAL LOW (ref 3.5–5.1)
Sodium: 132 mmol/L — ABNORMAL LOW (ref 135–145)
Total Bilirubin: 0.9 mg/dL (ref <1.2)
Total Protein: 6.6 g/dL (ref 6.5–8.1)

## 2022-11-26 LAB — BRAIN NATRIURETIC PEPTIDE: B Natriuretic Peptide: 53 pg/mL (ref 0.0–100.0)

## 2022-11-26 MED ORDER — POTASSIUM CHLORIDE 10 MEQ/100ML IV SOLN
10.0000 meq | Freq: Once | INTRAVENOUS | Status: AC
  Administered 2022-11-26: 10 meq via INTRAVENOUS
  Filled 2022-11-26: qty 100

## 2022-11-26 MED ORDER — POTASSIUM CHLORIDE CRYS ER 20 MEQ PO TBCR
40.0000 meq | EXTENDED_RELEASE_TABLET | Freq: Once | ORAL | Status: AC
Start: 2022-11-26 — End: 2022-11-26
  Administered 2022-11-26: 40 meq via ORAL
  Filled 2022-11-26: qty 2

## 2022-11-26 NOTE — ED Provider Notes (Signed)
[place] EMERGENCY DEPARTMENT AT [facility] Provider Note   CSN: [id number] Arrival date & time: 11/26/22  1150     History  Chief Complaint  Patient presents with   Weakness    Amy [name] is a 87 y.o. female.  87 year old female with prior medical history as detailed below presents for evaluation.  Patient reports approximately 1 to 2 weeks of persistent weakness.  Patient was seen by outside provider approximately 2 weeks ago and was diagnosed with possible pneumonia.  Patient reports that she was given both a Z-Pak and a course of amoxicillin.  Patient reports completion of the Z-Pak and only having 1 to 2 days of amoxicillin left.  She reports that her weakness is persistent.  She denies fever, cough, shortness of breath, chest pain.  The history is provided by the patient and medical records.       Home Medications Prior to Admission medications   Medication Sig Start Date End Date Taking? Authorizing Provider  albuterol (VENTOLIN HFA) 108 (90 Base) MCG/ACT inhaler Inhale 2 puffs into the lungs every 4 (four) hours as needed for wheezing or shortness of breath. 11/11/20   [provider]  allopurinol (ZYLOPRIM) 300 MG tablet Take 300 mg by mouth daily.    [provider]  ALPRAZolam Prudy Feeler) 0.25 MG tablet Take 0.25 mg by mouth 2 (two) times daily.    [provider]  amLODipine (NORVASC) 5 MG tablet Take 5 mg by mouth daily. 09/12/19   [provider]  aspirin EC 81 MG tablet Take 81 mg by mouth at bedtime.    [provider]  atorvastatin (LIPITOR) 20 MG tablet Take 20 mg by mouth at bedtime.    [provider]  Cholecalciferol (VITAMIN D3) 2000 UNITS TABS Take 2,000 Units by mouth at bedtime.     [provider]  fluticasone (FLONASE) 50 MCG/ACT nasal spray Administer 2 sprays in each nostril daily. 04/13/21   [provider]  furosemide (LASIX) 40 MG tablet Take 1 tablet (40 mg total) by  mouth daily. 04/25/22 07/24/22  [place], Alvira Philips, DO  gabapentin (NEURONTIN) 100 MG capsule TAKE 1 CAP EVERY MORNING, 1 CAP IN THE AFTERNOON, AND 2 CAPS EVERY DAY AT BEDTIME Patient taking differently: Take 100-200 mg by mouth See admin instructions. Take 100mg  by mouth every morning, 100mg  by mouth in the afternoon and 200mg  by mouth daily at bedtime. 04/15/18   Sater, Pearletha Furl, MD  levothyroxine (SYNTHROID) 88 MCG tablet Take 88 mcg by mouth daily before breakfast. 03/01/21   [provider]  metoprolol tartrate (LOPRESSOR) 25 MG tablet Take 25 mg by mouth 2 (two) times daily.    [provider]  mirtazapine (REMERON) 7.5 MG tablet Take 7.5 mg by mouth at bedtime. 06/23/21   [provider]  mometasone-formoterol (DULERA) 200-5 MCG/ACT AERO Inhale 2 puffs into the lungs 2 (two) times daily. 04/25/22   [place], Alvira Philips, DO  mupirocin ointment (BACTROBAN) 2 % Apply 1 Application topically 2 (two) times daily. 02/01/22   [provider]  neomycin-polymyxin b-dexamethasone (MAXITROL) 3.5-10000-0.1 SUSP Place 1 drop into the left eye in the morning, at noon, in the evening, and at bedtime. 04/20/22   [provider]  pantoprazole (PROTONIX) 40 MG tablet Take 40 mg by mouth 2 (two) times daily.     [provider]  Potassium 99 MG TABS Take 99 mg by mouth at bedtime.    [provider]  traMADol (  ULTRAM) 50 MG tablet Take 50 mg by mouth 2 (two) times daily as needed for moderate pain.     [provider]  vitamin B-12 (CYANOCOBALAMIN) 1000 MCG tablet Take 1,000 mcg by mouth daily.    [provider]      Allergies    Other and Codeine    Review of Systems   Review of Systems  All other systems reviewed and are negative.   Physical Exam Updated Vital Signs BP 118/60   Pulse 62   Temp 98.1 F (36.7 C) (Oral)   Resp 17   Ht 5\' 4"  (1.626 m)   Wt 52.2 kg   SpO2 93%   BMI 19.74 kg/m  Physical Exam Vitals and nursing note  reviewed.  Constitutional:      General: She is not in acute distress.    Appearance: Normal appearance. She is well-developed.  HENT:     Head: Normocephalic and atraumatic.  Eyes:     Conjunctiva/sclera: Conjunctivae normal.     Pupils: Pupils are equal, round, and reactive to light.  Cardiovascular:     Rate and Rhythm: Normal rate and regular rhythm.     Heart sounds: Normal heart sounds.  Pulmonary:     Effort: Pulmonary effort is normal. No respiratory distress.     Breath sounds: Normal breath sounds.  Abdominal:     General: There is no distension.     Palpations: Abdomen is soft.     Tenderness: There is no abdominal tenderness.  Musculoskeletal:        General: No deformity. Normal range of motion.     Cervical back: Normal range of motion and neck supple.  Skin:    General: Skin is warm and dry.  Neurological:     General: No focal deficit present.     Mental Status: She is alert and oriented to person, place, and time.     ED Results / Procedures / Treatments   Labs (all labs ordered are listed, but only abnormal results are displayed) Labs Reviewed  RESP PANEL BY RT-PCR (RSV, FLU A&B, COVID)  RVPGX2 - Abnormal; Notable for the following components:      Result Value   SARS Coronavirus 2 by RT PCR POSITIVE (*)    All other components within normal limits  CBC WITH DIFFERENTIAL/PLATELET - Abnormal; Notable for the following components:   WBC 11.2 (*)    Monocytes Absolute 1.2 (*)    All other components within normal limits  COMPREHENSIVE METABOLIC PANEL - Abnormal; Notable for the following components:   Sodium 132 (*)    Potassium 2.9 (*)    Chloride 92 (*)    Glucose, Bld 104 (*)    All other components within normal limits  BRAIN NATRIURETIC PEPTIDE  URINALYSIS, W/ REFLEX TO CULTURE (INFECTION SUSPECTED)  TROPONIN I (HIGH SENSITIVITY)  TROPONIN I (HIGH SENSITIVITY)    EKG EKG Interpretation Date/Time:  Sunday November 26 2022 12:01:51  EST Ventricular Rate:  65 PR Interval:  185 QRS Duration:  100 QT Interval:  430 QTC Calculation: 448 R Axis:   64  Text Interpretation: Sinus rhythm Biatrial enlargement Confirmed by Kristine Royal [phone]) on 11/26/2022 12:56:03 PM  Radiology DG Chest Port 1 View  Result Date: 11/26/2022 CLINICAL DATA:  Weakness.  Pneumonia.  COPD.  On antibiotic therapy. EXAM: PORTABLE CHEST 1 VIEW COMPARISON:  08/21/2022 FINDINGS: Stable mild cardiomegaly. Pulmonary hyperinflation again seen, consistent with COPD. Both lungs are clear. IMPRESSION: COPD.  No  active disease. Electronically Signed   By: Danae Orleans M.D.   On: 11/26/2022 13:09    Procedures Procedures    Medications Ordered in ED Medications  potassium chloride 10 mEq in 100 mL IVPB (10 mEq Intravenous New Bag/Given 11/26/22 1335)  potassium chloride SA (KLOR-CON M) CR tablet 40 mEq (40 mEq Oral Given 11/26/22 1333)    ED Course/ Medical Decision Making/ A&P                                 Medical Decision Making Amount and/or Complexity of Data Reviewed Labs: ordered. Radiology: ordered.  Risk Prescription drug management.    Medical Screen Complete  This patient presented to the ED with complaint of weakness, fatigue,.  This complaint involves an extensive number of treatment options. The initial differential diagnosis includes, but is not limited to, bacterial versus viral infection, metabolic abnormality, etc.  This presentation is: Chronic, Self-Limited, Previously Undiagnosed, Uncertain Prognosis, Complicated, and Systemic Symptoms  Patient is presenting with complaint of persistent upper respiratory infection and weakness times approximately 2 weeks.  Patient reports completion of Z-Pak and near completion of course of Augmentin for treatment of same.  Patient is improved after initial ED evaluation and administration of both IV fluids and potassium.  Notable lab abnormalities include a sodium of 132,  potassium of 2.9, white count of 11.2, positive COVID test on PCR testing.  Patient's symptoms are most likely explained by resolving COVID infection.  EKG is without evidence of acute ischemia.  Initial troponin is 11.  Patient feels improved and reassured after ED evaluation.  She understands need for close outpatient follow-up.  Strict return precautions given and understood.  Additional history obtained:  External records from outside sources obtained and reviewed including prior ED visits and prior Inpatient records.    Problem List / ED Course:  Covid   Reevaluation:  After the interventions noted above, I reevaluated the patient and found that they have: improved   Disposition:  After consideration of the diagnostic results and the patients response to treatment, I feel that the patent would benefit from close outpatient followup.          Final Clinical Impression(s) / ED Diagnoses Final diagnoses:  COVID    Rx / DC Orders ED Discharge Orders     None         Wynetta Fines, MD 11/26/22 [phone]

## 2022-11-26 NOTE — ED Triage Notes (Signed)
Patient brought in from home by EMS with c/o weakness. Patient states she was DX with pneumonia a week ago and was given amoxicillin and has 2 more days on it. She has completed her prednisone and denies fever, cough or SOB, has HX of COPD. CBG:131 132/68 93% RA

## 2022-11-26 NOTE — ED Notes (Signed)
[facility] called to have patient transported

## 2022-11-26 NOTE — Discharge Instructions (Addendum)
Return for any problem.  ?

## 2022-11-30 ENCOUNTER — Encounter: Payer: Self-pay | Admitting: Hematology and Oncology

## 2022-11-30 NOTE — Telephone Encounter (Signed)
Telephone call  

## 2023-01-11 ENCOUNTER — Other Ambulatory Visit: Payer: Self-pay | Admitting: Hematology and Oncology

## 2023-01-11 DIAGNOSIS — C8331 Diffuse large B-cell lymphoma, lymph nodes of head, face, and neck: Secondary | ICD-10-CM

## 2023-01-11 NOTE — Progress Notes (Signed)
 [facility] OFFICE PROGRESS NOTE  Debrah Josette ORN., PA-C [address] [place] [address]  DIAGNOSIS:  # Diffuse Large B Cell Lymphoma, Stage II 1) 09/26/2019: CT scan of neck performed due to persistent tonsillar lesion after outpatient antibiotic course. CT scan showed a heterogeneous right tonsillar mass up to 3.2 cm and a small but suspicious right level 2A lymph node  2) 09/30/2019: patient underwent biopsy with ENT which revealed a DLBCL with FISH for MYC, BCL2 and BCL6 pending 3) 10/08/2019: establish care with Dr. Federico  4) 10/21/2019: PET scan showed hypermetabolic right tonsillar mass and adjacent right level 2 adenopathy, consistent with the given history of lymphoma. 5) 11/13/2019: Cycle 1 Day 1 of R-miniCHOP 6) 12/05/2019: Cycle 2 Day 1 of R-miniCHOP 7) 12/25/2019: Cycle 3 Day 1 of R-miniCHOP 8) 02/04/2020-02/27/2020: radiation therapy.  9) 12/27/2020: CT Chest/Ab/Pelvis/Neck showed no evidence of disease recurrence/progression.  10) 01/07/2022: CT Chest/Ab/Pelvis/Neck showed no evidence of disease recurrence/progression.   CURRENT THERAPY: Observation   INTERVAL HISTORY: Amy [name] 88 y.o. female returns to the clinic today for a routine follow-up visit.  The patient has a history of diffuse large B-cell lymphoma. She is currently on observation.  She was last seen by Dr. Federico on 07/19/2022.  Overall she is feeling fair today.  In November 2024 she presented to the ER for weakness. She tested positive for COVID-19. She had cough and shortness of breath. She then had bronchitis afterwards and was treated with antibiotics.  She states overall she was having shortness of breath but it is better now.  She had a significant cough that is not completely gone but she states it is a lot better. She states she was given an inhaler for when she was having shortness of breath. Her PCP ordered a CXR which she is going to [facility] imaging today for.   He also  mention she struggles with stress cystitis.  She was previously followed by a urologist who has since retired.  She is expected to establish care with Dr. Marykay later this month.   Otherwise she denies any fever, chills, night sweats, or lymphadenopathy.  She has been struggling with decreased appetite.  She denies any bloating, abdominal pain, or early satiety.  She just does not feel as hungry.  She was prescribed Remeron  7.5 mg in the past for her appetite. She is going to ask her medical doctor about dose adjustments or other options. She denies any other signs of infection such as nasal congestion, sore throat, skin infections, or dysuria.  She denies any abnormal bleeding or bruising.  She denies any nausea, vomiting, diarrhea, or constipation.  She is here today for evaluation repeat blood work.   MEDICAL HISTORY: Past Medical History:  Diagnosis Date   Anxiety    Arthritis    COPD (chronic obstructive pulmonary disease) (HCC)    Esophageal stricture    s/p repeated dilations, Dr. Rosalie   GERD (gastroesophageal reflux disease)    Hearing loss bilateral   has hearing aids   Heart murmur    With no gross abnormalities on echocardiogram.   Hiatal hernia    History of blood transfusion    many years ago    Hyperlipidemia    Hypothyroidism    large b cell lymphoma 04/2019   Osteoarthritis    Osteoporosis    Paroxysmal atrial fibrillation (HCC)    1 confirm documented episode of A. fib; not on anticoagulation.   Pulmonary nodules  Renal lesion    1cm left kidney    ALLERGIES:  is allergic to other and codeine.  MEDICATIONS:  Current Outpatient Medications  Medication Sig Dispense Refill   albuterol  (VENTOLIN  HFA) 108 (90 Base) MCG/ACT inhaler Inhale 2 puffs into the lungs every 4 (four) hours as needed for wheezing or shortness of breath.     allopurinol  (ZYLOPRIM ) 300 MG tablet Take 300 mg by mouth daily.     ALPRAZolam  (XANAX ) 0.25 MG tablet Take 0.25 mg by mouth 2  (two) times daily.     amLODipine  (NORVASC ) 5 MG tablet Take 5 mg by mouth daily.     aspirin  EC 81 MG tablet Take 81 mg by mouth at bedtime.     atorvastatin  (LIPITOR) 20 MG tablet Take 20 mg by mouth at bedtime.  11   Cholecalciferol  (VITAMIN D3) 2000 UNITS TABS Take 2,000 Units by mouth at bedtime.      fluticasone  (FLONASE ) 50 MCG/ACT nasal spray Administer 2 sprays in each nostril daily.     furosemide  (LASIX ) 40 MG tablet Take 1 tablet (40 mg total) by mouth daily. 30 tablet 2   gabapentin  (NEURONTIN ) 100 MG capsule TAKE 1 CAP EVERY MORNING, 1 CAP IN THE AFTERNOON, AND 2 CAPS EVERY DAY AT BEDTIME (Patient taking differently: Take 100-200 mg by mouth See admin instructions. Take 100mg  by mouth every morning, 100mg  by mouth in the afternoon and 200mg  by mouth daily at bedtime.) 360 capsule 2   levothyroxine  (SYNTHROID ) 88 MCG tablet Take 88 mcg by mouth daily before breakfast.     metoprolol  tartrate (LOPRESSOR ) 25 MG tablet Take 25 mg by mouth 2 (two) times daily.     mirtazapine  (REMERON ) 7.5 MG tablet Take 7.5 mg by mouth at bedtime.     mometasone -formoterol  (DULERA ) 200-5 MCG/ACT AERO Inhale 2 puffs into the lungs 2 (two) times daily. 1 each 2   mupirocin ointment (BACTROBAN) 2 % Apply 1 Application topically 2 (two) times daily.     neomycin-polymyxin b-dexamethasone  (MAXITROL) 3.5-10000-0.1 SUSP Place 1 drop into the left eye in the morning, at noon, in the evening, and at bedtime.     pantoprazole  (PROTONIX ) 40 MG tablet Take 40 mg by mouth 2 (two) times daily.   11   Potassium 99 MG TABS Take 99 mg by mouth at bedtime.     traMADol  (ULTRAM ) 50 MG tablet Take 50 mg by mouth 2 (two) times daily as needed for moderate pain.      vitamin B-12 (CYANOCOBALAMIN ) 1000 MCG tablet Take 1,000 mcg by mouth daily.     No current facility-administered medications for this visit.    SURGICAL HISTORY:  Past Surgical History:  Procedure Laterality Date   ABDOMINAL HYSTERECTOMY     BREAST BIOPSY  Left 10/19/2015   BREAST ENHANCEMENT SURGERY  01/10/2004   CATARACT EXTRACTION W/ INTRAOCULAR LENS  IMPLANT, BILATERAL  '09   ESOPHAGEAL DILATION     IR IMAGING GUIDED PORT INSERTION  11/05/2019   IR REMOVAL TUN ACCESS W/ PORT W/O FL MOD SED  05/03/2020   KNEE ARTHROSCOPY  02/22/2011   Procedure: ARTHROSCOPY KNEE;  Surgeon: Dempsey LULLA Moan, MD;  Location: [facility];  Service: Orthopedics;  Laterality: Right;  WITH DEBRIDEMENT    LAPAROSCOPIC SMALL BOWEL RESECTION N/A 06/02/2016   Procedure: DIAGNOSTIC LAPAROSCOPY SMALL BOWEL RESECTION;  Surgeon: Eletha Boas, MD;  Location: [facility];  Service: General;  Laterality: N/A;   TRANSTHORACIC ECHOCARDIOGRAM  03/24/2016   a) (In setting  of COPD exacerbation-pneumonia) EF 60 to 65%. ~Appear to be in A. Fib.  Unable to assess diastolic function.  Normal wall motion.  Mild LA dilation.  Trivial MR.  Elevated PAP estimated 53 mmHg, RAP 15 mm.; b) 07/29/2018: Vigorous LV function, EF> 65%.  Normal wall motion.  GR 1 DD with elevated LAP.  Normal RV size and function.  Normal valves.  Trivial MR.   TRANSTHORACIC ECHOCARDIOGRAM  10/2019   EF 60 to 65%.  Normal LV size and function.  No R WMA.  GR 1 DD.  Normal PA/RVP with normal RV.  Normal RAP.  Grossly normal aortic and mitral valves.  No significant change from prior study.   Zio Patch Monitor-back-to-back 14 days (28-day total)  12/2020   Predominantly SR: HR range 47-102 bpm, AVG 63 bpm.  Rare PACs and PVCs.  9 atrial runs: Fastest 8 beats 139 bpm, longest 9 beats 90 bpm.  No sustained arrhythmias either fast or slow.  No pauses.  Symptoms noted with sinus rhythm and occasionally with sinus rhythm associated with PACs or PVCs.    REVIEW OF SYSTEMS:   Review of Systems  Constitutional: Positive for decreased appetite/ Negative for chills, fatigue, fever. HENT: Negative for mouth sores, nosebleeds, sore throat and trouble swallowing.   Eyes: Negative for eye problems and icterus.   Respiratory: Improved shortness of breath from COVID in November 2024 and improving cough. Negative for hemoptysis and wheezing.   Cardiovascular: Negative for chest pain and leg swelling.  Gastrointestinal: Negative for abdominal pain, constipation, diarrhea, nausea and vomiting.  Genitourinary: Negative for bladder incontinence, difficulty urinating, dysuria, frequency and hematuria.   Musculoskeletal: Negative for back pain, gait problem, neck pain and neck stiffness.  Skin: Negative for itching and rash.  Neurological: Negative for dizziness, extremity weakness, gait problem, headaches, light-headedness and seizures.  Hematological: Negative for adenopathy. Does not bruise/bleed easily.  Psychiatric/Behavioral: Negative for confusion, depression and sleep disturbance. The patient is not nervous/anxious.     PHYSICAL EXAMINATION:  Blood pressure 132/63, pulse 80, temperature 97.8 F (36.6 C), temperature source Temporal, resp. rate 16, height 5' 4 (1.626 m), weight 133 lb (60.3 kg), SpO2 94%.  ECOG PERFORMANCE STATUS: 1  Physical Exam  Constitutional: Oriented to person, place, and time and well-developed, well-nourished, and in no distress.  HENT:  Head: Normocephalic and atraumatic.  Mouth/Throat: Oropharynx is clear and moist. No oropharyngeal exudate.  Eyes: Conjunctivae are normal. Right eye exhibits no discharge. Left eye exhibits no discharge. No scleral icterus.  Neck: Normal range of motion. Neck supple.  Cardiovascular: Normal rate, regular rhythm, normal heart sounds and intact distal pulses.   Pulmonary/Chest: Effort normal. She had some rhonchi in the right lung which improved with coughing. No respiratory distress. No wheezes.  Abdominal: Soft. Bowel sounds are normal. Exhibits no distension and no mass. There is no tenderness.  Musculoskeletal: Normal range of motion. Exhibits no edema.  Lymphadenopathy:    No cervical adenopathy.  Neurological: Alert and oriented  to person, place, and time. Exhibits normal muscle tone. Gait normal. Coordination normal.  Skin: Skin is warm and dry. No rash noted. Not diaphoretic. No erythema. No pallor.  Psychiatric: Mood, memory and judgment normal.  Vitals reviewed.  LABORATORY DATA: Lab Results  Component Value Date   WBC 6.9 01/12/2023   HGB 12.0 01/12/2023   HCT 36.2 01/12/2023   MCV 98.1 01/12/2023   PLT 222 01/12/2023      Chemistry      Component Value  Date/Time   NA 139 01/12/2023 0925   K 3.7 01/12/2023 0925   CL 103 01/12/2023 0925   CO2 31 01/12/2023 0925   BUN 7 (L) 01/12/2023 0925   CREATININE 0.65 01/12/2023 0925      Component Value Date/Time   CALCIUM  9.2 01/12/2023 0925   ALKPHOS 99 01/12/2023 0925   AST 18 01/12/2023 0925   ALT 11 01/12/2023 0925   BILITOT 0.7 01/12/2023 0925       RADIOGRAPHIC STUDIES:  No results found.   ASSESSMENT/PLAN:  Jama JULIANNA Lager 88 y.o. female with medical history significant for Stage II DLBCL of the tonsil who presents for a follow up visit.   Her initial workup with labs, the records, discussion with the patient the findings most consistent with a stage II diffuse large B-cell lymphoma of the tonsil. She had involvement of the right tonsil as well as axillary lymph nodes which would constitute stage II disease. She did have some mild activity in other lymph nodes throughout the body, but nowhere near as active as the ones within her head neck. Lymphomas involvement of these lymph nodes is not likely and therefore Dr. Federico proceeded with treatment as if the patient were a stage II.   Previously discussed the diagnosis of diffuse large B-cell lymphoma and the treatment options moving forward.  The regimen most recommended for this patient would be R mini CHOP with consideration for 3 cycles followed by radiation to the local lymph nodes.  This is consistent with the treatment course to be recommended with R-CHOP chemotherapy for stage II disease.  We  are still currently waiting for the results of the double hit panel, however this would not likely change management as the patient would not be able to tolerate full strength chemotherapy with something like R-EPOCH.  Dr. Federico also previously discussed the risks and benefits of this R-CHOP chemotherapy including constipation, neurotoxicity, cardiac toxicity, drop in blood counts, fatigue, nausea, vomiting, and alopecia.  The patient voiced her understanding of these complications and wished to proceed forward with treatment.   The regimen of R- miniCHOP consists of rituximab  325 mg per metered squared, cyclophosphamide  400 mg per metered squared IV, doxorubicin  25 mg per metered squared IV, vincristine  1 mg IV, and prednisone  40 mg per metered squared p.o. on days 1 through 5.  This will be pursued for 3 cycles of chemotherapy with the addition of radiation therapy.  Based on NCCN recommendations, member institutes do practice this regimen (short course chemo + RT), though data is not robust.  # Diffuse Large B Cell Lymphoma, Stage II --complete response noted on PET CT scan on 01/08/2020. No evidence of recurrent on CT scans from 01/05/2022.  --completed 3 cycles of R-mini-CHOP followed by localized radiation.  -- TTE shows EF 60-65% with mild diastolic dysfunction --patient has established care with Dr. Izell and underwent radiation therapy which ended on 02/27/2020.  -- Labs today show white blood cell count 6.9, hemoglobin 12.0, MCV 98.1, and platelets of 222.  Creatinine and LFTs are both within normal limits. --repeat CT scan can be performed as clinically indicated moving forward per Dr. Lafonda last note.  --RTC in 6 months time for repeat clinic visit.  Of course, I discussed with the patient should she ever develop any new concerning symptoms we would always be happy to see her sooner  Health Maintenance -- He is going to establish care with [facility] Dr. Marykay later this month  for her chronic  stress cystitis.  She denies any dysuria today -- She is expected to have a chest x-ray today (ordered by her PCP) to follow-up on her COVID-19 infection and bronchitis. -- She is going to talk to her PCP about dose adjustments of her medications for her appetite.  Orders Placed This Encounter  Procedures   CBC with Differential (Cancer Center Only)    Standing Status:   Future    Expected Date:   07/12/2023    Expiration Date:   01/12/2024   CMP (Cancer Center only)    Standing Status:   Future    Expected Date:   07/12/2023    Expiration Date:   01/12/2024   Lactate dehydrogenase (LDH)    Standing Status:   Future    Expected Date:   07/12/2023    Expiration Date:   01/12/2024     The total time spent in the appointment was 20-29 minutes  Amy [name] L Horris Speros, PA-C 01/12/23

## 2023-01-12 ENCOUNTER — Inpatient Hospital Stay: Payer: Medicare Other | Admitting: Physician Assistant

## 2023-01-12 ENCOUNTER — Ambulatory Visit
Admission: RE | Admit: 2023-01-12 | Discharge: 2023-01-12 | Disposition: A | Discharge status: Home / Self Care | Payer: Medicare Other | Source: Ambulatory Visit | Attending: Family Medicine | Admitting: Family Medicine

## 2023-01-12 ENCOUNTER — Inpatient Hospital Stay: Payer: Medicare Other | Attending: Hematology and Oncology

## 2023-01-12 ENCOUNTER — Other Ambulatory Visit: Payer: Self-pay | Admitting: Family Medicine

## 2023-01-12 VITALS — BP 132/63 | HR 80 | Temp 97.8°F | Resp 16 | Ht 64.0 in | Wt 133.0 lb

## 2023-01-12 DIAGNOSIS — Z9221 Personal history of antineoplastic chemotherapy: Secondary | ICD-10-CM | POA: Diagnosis not present

## 2023-01-12 DIAGNOSIS — R062 Wheezing: Secondary | ICD-10-CM

## 2023-01-12 DIAGNOSIS — Z923 Personal history of irradiation: Secondary | ICD-10-CM | POA: Insufficient documentation

## 2023-01-12 DIAGNOSIS — R059 Cough, unspecified: Secondary | ICD-10-CM

## 2023-01-12 DIAGNOSIS — C8331 Diffuse large B-cell lymphoma, lymph nodes of head, face, and neck: Secondary | ICD-10-CM

## 2023-01-12 DIAGNOSIS — Z8572 Personal history of non-Hodgkin lymphomas: Secondary | ICD-10-CM | POA: Insufficient documentation

## 2023-01-12 LAB — CMP (CANCER CENTER ONLY)
ALT: 11 U/L (ref 0–44)
AST: 18 U/L (ref 15–41)
Albumin: 3.6 g/dL (ref 3.5–5.0)
Alkaline Phosphatase: 99 U/L (ref 38–126)
Anion gap: 5 (ref 5–15)
BUN: 7 mg/dL — ABNORMAL LOW (ref 8–23)
CO2: 31 mmol/L (ref 22–32)
Calcium: 9.2 mg/dL (ref 8.9–10.3)
Chloride: 103 mmol/L (ref 98–111)
Creatinine: 0.65 mg/dL (ref 0.44–1.00)
GFR, Estimated: >60 mL/min (ref >60)
Glucose, Bld: 128 mg/dL — ABNORMAL HIGH (ref 70–99)
Potassium: 3.7 mmol/L (ref 3.5–5.1)
Sodium: 139 mmol/L (ref 135–145)
Total Bilirubin: 0.7 mg/dL (ref 0.0–1.2)
Total Protein: 6.2 g/dL — ABNORMAL LOW (ref 6.5–8.1)

## 2023-01-12 LAB — CBC WITH DIFFERENTIAL (CANCER CENTER ONLY)
Abs Immature Granulocytes: 0.01 10*3/uL (ref 0.00–0.07)
Basophils Absolute: 0.1 10*3/uL (ref 0.0–0.1)
Basophils Relative: 1 %
Eosinophils Absolute: 0.3 10*3/uL (ref 0.0–0.5)
Eosinophils Relative: 4 %
HCT: 36.2 % (ref 36.0–46.0)
Hemoglobin: 12 g/dL (ref 12.0–15.0)
Immature Granulocytes: 0 %
Lymphocytes Relative: 50 %
Lymphs Abs: 3.5 10*3/uL (ref 0.7–4.0)
MCH: 32.5 pg (ref 26.0–34.0)
MCHC: 33.1 g/dL (ref 30.0–36.0)
MCV: 98.1 fL (ref 80.0–100.0)
Monocytes Absolute: 0.7 10*3/uL (ref 0.1–1.0)
Monocytes Relative: 11 %
Neutro Abs: 2.3 10*3/uL (ref 1.7–7.7)
Neutrophils Relative %: 34 %
Platelet Count: 222 10*3/uL (ref 150–400)
RBC: 3.69 MIL/uL — ABNORMAL LOW (ref 3.87–5.11)
RDW: 14.8 % (ref 11.5–15.5)
WBC Count: 6.9 10*3/uL (ref 4.0–10.5)
nRBC: 0 % (ref 0.0–0.2)

## 2023-01-12 LAB — LACTATE DEHYDROGENASE: LDH: 152 U/L (ref 98–192)

## 2023-02-15 ENCOUNTER — Observation Stay (HOSPITAL_COMMUNITY)
Admission: EM | Admit: 2023-02-15 | Discharge: 2023-02-17 | Disposition: A | Discharge status: Home / Self Care | Payer: Medicare Other | Attending: Emergency Medicine | Admitting: Emergency Medicine

## 2023-02-15 ENCOUNTER — Other Ambulatory Visit: Payer: Self-pay

## 2023-02-15 ENCOUNTER — Encounter (HOSPITAL_COMMUNITY): Payer: Self-pay | Admitting: Internal Medicine

## 2023-02-15 ENCOUNTER — Emergency Department (HOSPITAL_COMMUNITY): Payer: Medicare Other

## 2023-02-15 DIAGNOSIS — J9601 Acute respiratory failure with hypoxia: Principal | ICD-10-CM | POA: Insufficient documentation

## 2023-02-15 DIAGNOSIS — Z79891 Long term (current) use of opiate analgesic: Secondary | ICD-10-CM | POA: Insufficient documentation

## 2023-02-15 DIAGNOSIS — Z79899 Other long term (current) drug therapy: Secondary | ICD-10-CM | POA: Insufficient documentation

## 2023-02-15 DIAGNOSIS — N3010 Interstitial cystitis (chronic) without hematuria: Secondary | ICD-10-CM | POA: Insufficient documentation

## 2023-02-15 DIAGNOSIS — K219 Gastro-esophageal reflux disease without esophagitis: Secondary | ICD-10-CM | POA: Insufficient documentation

## 2023-02-15 DIAGNOSIS — D539 Nutritional anemia, unspecified: Secondary | ICD-10-CM | POA: Diagnosis not present

## 2023-02-15 DIAGNOSIS — K222 Esophageal obstruction: Secondary | ICD-10-CM | POA: Insufficient documentation

## 2023-02-15 DIAGNOSIS — Z7901 Long term (current) use of anticoagulants: Secondary | ICD-10-CM | POA: Diagnosis not present

## 2023-02-15 DIAGNOSIS — I10 Essential (primary) hypertension: Secondary | ICD-10-CM | POA: Diagnosis present

## 2023-02-15 DIAGNOSIS — Z20822 Contact with and (suspected) exposure to covid-19: Secondary | ICD-10-CM | POA: Diagnosis not present

## 2023-02-15 DIAGNOSIS — E785 Hyperlipidemia, unspecified: Secondary | ICD-10-CM | POA: Diagnosis not present

## 2023-02-15 DIAGNOSIS — E039 Hypothyroidism, unspecified: Secondary | ICD-10-CM | POA: Diagnosis not present

## 2023-02-15 DIAGNOSIS — J441 Chronic obstructive pulmonary disease with (acute) exacerbation: Secondary | ICD-10-CM | POA: Insufficient documentation

## 2023-02-15 DIAGNOSIS — R0602 Shortness of breath: Secondary | ICD-10-CM | POA: Diagnosis present

## 2023-02-15 DIAGNOSIS — Z8679 Personal history of other diseases of the circulatory system: Secondary | ICD-10-CM | POA: Diagnosis not present

## 2023-02-15 DIAGNOSIS — M109 Gout, unspecified: Secondary | ICD-10-CM | POA: Diagnosis not present

## 2023-02-15 LAB — CBC WITH DIFFERENTIAL/PLATELET
Abs Immature Granulocytes: 0.01 10*3/uL (ref 0.00–0.07)
Basophils Absolute: 0 10*3/uL (ref 0.0–0.1)
Basophils Relative: 0 %
Eosinophils Absolute: 0.2 10*3/uL (ref 0.0–0.5)
Eosinophils Relative: 2 %
HCT: 32.3 % — ABNORMAL LOW (ref 36.0–46.0)
Hemoglobin: 10.4 g/dL — ABNORMAL LOW (ref 12.0–15.0)
Immature Granulocytes: 0 %
Lymphocytes Relative: 56 %
Lymphs Abs: 5.1 10*3/uL — ABNORMAL HIGH (ref 0.7–4.0)
MCH: 32.6 pg (ref 26.0–34.0)
MCHC: 32.2 g/dL (ref 30.0–36.0)
MCV: 101.3 fL — ABNORMAL HIGH (ref 80.0–100.0)
Monocytes Absolute: 0.7 10*3/uL (ref 0.1–1.0)
Monocytes Relative: 8 %
Neutro Abs: 3.1 10*3/uL (ref 1.7–7.7)
Neutrophils Relative %: 34 %
Platelets: 191 10*3/uL (ref 150–400)
RBC: 3.19 MIL/uL — ABNORMAL LOW (ref 3.87–5.11)
RDW: 14.1 % (ref 11.5–15.5)
WBC: 9.1 10*3/uL (ref 4.0–10.5)
nRBC: 0 % (ref 0.0–0.2)

## 2023-02-15 LAB — BASIC METABOLIC PANEL
Anion gap: 10 (ref 5–15)
BUN: 11 mg/dL (ref 8–23)
CO2: 30 mmol/L (ref 22–32)
Calcium: 8.8 mg/dL — ABNORMAL LOW (ref 8.9–10.3)
Chloride: 95 mmol/L — ABNORMAL LOW (ref 98–111)
Creatinine, Ser: 0.91 mg/dL (ref 0.44–1.00)
GFR, Estimated: >60 mL/min (ref >60)
Glucose, Bld: 101 mg/dL — ABNORMAL HIGH (ref 70–99)
Potassium: 4.1 mmol/L (ref 3.5–5.1)
Sodium: 135 mmol/L (ref 135–145)

## 2023-02-15 LAB — URINALYSIS, ROUTINE W REFLEX MICROSCOPIC
Bacteria, UA: NONE SEEN
Bilirubin Urine: NEGATIVE
Glucose, UA: NEGATIVE mg/dL
Hgb urine dipstick: NEGATIVE
Ketones, ur: NEGATIVE mg/dL
Nitrite: NEGATIVE
Protein, ur: NEGATIVE mg/dL
Specific Gravity, Urine: 1.014 (ref 1.005–1.030)
pH: 5 (ref 5.0–8.0)

## 2023-02-15 LAB — D-DIMER, QUANTITATIVE: D-Dimer, Quant: 0.7 ug{FEU}/mL — ABNORMAL HIGH (ref 0.00–0.50)

## 2023-02-15 LAB — RESP PANEL BY RT-PCR (RSV, FLU A&B, COVID)  RVPGX2
Influenza A by PCR: NEGATIVE
Influenza B by PCR: NEGATIVE
Resp Syncytial Virus by PCR: NEGATIVE
SARS Coronavirus 2 by RT PCR: NEGATIVE

## 2023-02-15 LAB — PROCALCITONIN: Procalcitonin: <0.1 ng/mL

## 2023-02-15 MED ORDER — SODIUM CHLORIDE 0.9 % IV SOLN: 1.0000 g | Freq: Once | INTRAVENOUS | Status: DC

## 2023-02-15 MED ORDER — MAGNESIUM SULFATE 2 GM/50ML IV SOLN
2.0000 g | Freq: Once | INTRAVENOUS | Status: AC
  Administered 2023-02-15: 2 g via INTRAVENOUS
  Filled 2023-02-15: qty 50

## 2023-02-15 MED ORDER — ONDANSETRON HCL 4 MG/2ML IJ SOLN: 4.0000 mg | Freq: Four times a day (QID) | INTRAMUSCULAR | Status: DC | PRN

## 2023-02-15 MED ORDER — ENSURE ENLIVE PO LIQD
237.0000 mL | Freq: Two times a day (BID) | ORAL | Status: DC
  Administered 2023-02-16 – 2023-02-17 (×3): 237 mL via ORAL

## 2023-02-15 MED ORDER — ATORVASTATIN CALCIUM 20 MG PO TABS
20.0000 mg | ORAL_TABLET | Freq: Every day | ORAL | Status: DC
Start: 2023-02-15 — End: 2023-02-17
  Administered 2023-02-15 – 2023-02-16 (×2): 20 mg via ORAL
  Filled 2023-02-15 (×2): qty 1

## 2023-02-15 MED ORDER — IPRATROPIUM-ALBUTEROL 0.5-2.5 (3) MG/3ML IN SOLN: 3.0000 mL | Freq: Four times a day (QID) | RESPIRATORY_TRACT | Status: DC

## 2023-02-15 MED ORDER — METHYLPREDNISOLONE SODIUM SUCC 125 MG IJ SOLR
125.0000 mg | Freq: Once | INTRAMUSCULAR | Status: AC
  Administered 2023-02-15: 125 mg via INTRAVENOUS
  Filled 2023-02-15: qty 2

## 2023-02-15 MED ORDER — AMLODIPINE BESYLATE 5 MG PO TABS
5.0000 mg | ORAL_TABLET | Freq: Every day | ORAL | Status: DC
  Administered 2023-02-15 – 2023-02-17 (×3): 5 mg via ORAL
  Filled 2023-02-15 (×3): qty 1

## 2023-02-15 MED ORDER — ALLOPURINOL 300 MG PO TABS
300.0000 mg | ORAL_TABLET | Freq: Every day | ORAL | Status: DC
  Administered 2023-02-15 – 2023-02-17 (×3): 300 mg via ORAL
  Filled 2023-02-15 (×3): qty 1

## 2023-02-15 MED ORDER — IPRATROPIUM-ALBUTEROL 0.5-2.5 (3) MG/3ML IN SOLN
3.0000 mL | Freq: Once | RESPIRATORY_TRACT | Status: AC
  Administered 2023-02-15: 3 mL via RESPIRATORY_TRACT
  Filled 2023-02-15: qty 3

## 2023-02-15 MED ORDER — TRAMADOL HCL 50 MG PO TABS
50.0000 mg | ORAL_TABLET | Freq: Two times a day (BID) | ORAL | Status: DC | PRN
  Administered 2023-02-15 – 2023-02-16 (×3): 50 mg via ORAL
  Filled 2023-02-15 (×3): qty 1

## 2023-02-15 MED ORDER — ACETAMINOPHEN 325 MG PO TABS: 650.0000 mg | ORAL_TABLET | Freq: Four times a day (QID) | ORAL | Status: DC | PRN

## 2023-02-15 MED ORDER — IPRATROPIUM-ALBUTEROL 0.5-2.5 (3) MG/3ML IN SOLN
3.0000 mL | Freq: Three times a day (TID) | RESPIRATORY_TRACT | Status: DC
  Administered 2023-02-15 – 2023-02-17 (×5): 3 mL via RESPIRATORY_TRACT
  Filled 2023-02-15 (×5): qty 3

## 2023-02-15 MED ORDER — ALBUTEROL SULFATE (2.5 MG/3ML) 0.083% IN NEBU: 2.5000 mg | INHALATION_SOLUTION | RESPIRATORY_TRACT | Status: DC | PRN

## 2023-02-15 MED ORDER — MENTHOL 3 MG MT LOZG
1.0000 | LOZENGE | OROMUCOSAL | Status: DC | PRN
  Filled 2023-02-15: qty 9

## 2023-02-15 MED ORDER — PREDNISONE 20 MG PO TABS
40.0000 mg | ORAL_TABLET | Freq: Every day | ORAL | Status: DC
  Administered 2023-02-16 – 2023-02-17 (×2): 40 mg via ORAL
  Filled 2023-02-15 (×2): qty 2

## 2023-02-15 MED ORDER — MIRABEGRON ER 25 MG PO TB24
25.0000 mg | ORAL_TABLET | Freq: Every day | ORAL | Status: DC
  Administered 2023-02-16 – 2023-02-17 (×2): 25 mg via ORAL
  Filled 2023-02-15 (×2): qty 1

## 2023-02-15 MED ORDER — GABAPENTIN 100 MG PO CAPS
100.0000 mg | ORAL_CAPSULE | Freq: Two times a day (BID) | ORAL | Status: DC
  Administered 2023-02-16 – 2023-02-17 (×3): 100 mg via ORAL
  Filled 2023-02-15 (×3): qty 1

## 2023-02-15 MED ORDER — METOPROLOL TARTRATE 25 MG PO TABS
25.0000 mg | ORAL_TABLET | Freq: Two times a day (BID) | ORAL | Status: DC
  Administered 2023-02-15 – 2023-02-17 (×4): 25 mg via ORAL
  Filled 2023-02-15 (×4): qty 1

## 2023-02-15 MED ORDER — GABAPENTIN 100 MG PO CAPS
200.0000 mg | ORAL_CAPSULE | Freq: Every day | ORAL | Status: DC
Start: 2023-02-15 — End: 2023-02-17
  Administered 2023-02-15 – 2023-02-16 (×2): 200 mg via ORAL
  Filled 2023-02-15 (×2): qty 2

## 2023-02-15 MED ORDER — PANTOPRAZOLE SODIUM 40 MG PO TBEC
40.0000 mg | DELAYED_RELEASE_TABLET | Freq: Two times a day (BID) | ORAL | Status: DC
  Administered 2023-02-15 – 2023-02-17 (×4): 40 mg via ORAL
  Filled 2023-02-15 (×4): qty 1

## 2023-02-15 MED ORDER — ONDANSETRON HCL 4 MG PO TABS: 4.0000 mg | ORAL_TABLET | Freq: Four times a day (QID) | ORAL | Status: DC | PRN

## 2023-02-15 MED ORDER — ASPIRIN 81 MG PO TBEC
81.0000 mg | DELAYED_RELEASE_TABLET | Freq: Every day | ORAL | Status: DC
  Administered 2023-02-15 – 2023-02-16 (×2): 81 mg via ORAL
  Filled 2023-02-15 (×2): qty 1

## 2023-02-15 MED ORDER — POLYVINYL ALCOHOL 1.4 % OP SOLN: 1.0000 [drp] | Freq: Four times a day (QID) | OPHTHALMIC | Status: DC | PRN

## 2023-02-15 MED ORDER — VITAMIN B-12 1000 MCG PO TABS
1000.0000 ug | ORAL_TABLET | Freq: Every day | ORAL | Status: DC
  Administered 2023-02-16 – 2023-02-17 (×2): 1000 ug via ORAL
  Filled 2023-02-15 (×2): qty 1

## 2023-02-15 MED ORDER — LEVOTHYROXINE SODIUM 88 MCG PO TABS
88.0000 ug | ORAL_TABLET | Freq: Every day | ORAL | Status: DC
Start: 2023-02-16 — End: 2023-02-17
  Administered 2023-02-16 – 2023-02-17 (×2): 88 ug via ORAL
  Filled 2023-02-15 (×2): qty 1

## 2023-02-15 MED ORDER — AZITHROMYCIN 250 MG PO TABS: 500.0000 mg | ORAL_TABLET | Freq: Once | ORAL | Status: DC

## 2023-02-15 MED ORDER — MIRTAZAPINE 15 MG PO TABS
7.5000 mg | ORAL_TABLET | Freq: Every day | ORAL | Status: DC
  Administered 2023-02-15 – 2023-02-16 (×2): 7.5 mg via ORAL
  Filled 2023-02-15 (×2): qty 1

## 2023-02-15 MED ORDER — ACETAMINOPHEN 650 MG RE SUPP: 650.0000 mg | Freq: Four times a day (QID) | RECTAL | Status: DC | PRN

## 2023-02-15 MED ORDER — ENOXAPARIN SODIUM 40 MG/0.4ML IJ SOSY
40.0000 mg | PREFILLED_SYRINGE | INTRAMUSCULAR | Status: DC
  Administered 2023-02-15 – 2023-02-16 (×2): 40 mg via SUBCUTANEOUS
  Filled 2023-02-15 (×2): qty 0.4

## 2023-02-15 NOTE — H&P (Signed)
 History and Physical    Patient: Amy [name] FMW:[id number] DOB: Jul 24, 1935 DOA: 02/15/2023 DOS: the patient was seen and examined on 02/15/2023 PCP: Debrah Josette ORN., PA-C  Patient coming from: Home  Chief Complaint:  Chief Complaint  Patient presents with   Shortness of Breath   HPI: Amy [name] is a 88 y.o. female with medical history significant of anxiety, osteoarthritis, osteoporosis, COPD, spinal stricture, GERD, hiatal hernia, hearing loss, hyperlipidemia, hypothyroidism, large B-cell lymphoma, pulmonary nodules, renal lesion, paroxysmal atrial fibrillation who presented to the emergency department via EMS with a 3-week history of dyspnea, wheezing, mostly nonproductive cough with occasional sputum production nausea, fatigue and malaise.  Her symptoms started after she got exposed to cold air. She was initially 88% on room air and increased to 93% on 2 L nasal cannula.  She has also had some urinary discomfort and frequency.  No flank pain.  No fever, sore throat or hemoptysis.  No nausea, emesis or diarrhea.  No sick contacts or travel history.  No polyuria, polydipsia, polyphagia or blurred vision.  Lab work: Urinalysis showed trace leukocyte esterase but was otherwise normal.  Coronavirus, influenza and RSV PCR negative.  CBC showed a white count of 9.1, hemoglobin 10.4 g/dL with an MCV of 898.6 fL and platelets 191.  D-dimer was 0.70 (normal when adjusted for age).  BMP showed a chloride of 95 mmol/L, glucose of 101 and calcium  of 8.8 mg/dL, renal function and the rest of the electrolytes were normal.  Imaging: 2 view chest radiograph showing new small layering pleural effusions since last month, lower lung volumes.  Borderline to mild cardiomegaly.  Stable cardiac size and mediastinal contours.   ED course: Initial vital signs were temperature 98.6 F, pulse 88, respiration 18, BP 116/49 mmHg O2 sat 93% on room air.  The patient received a DuoNeb and 125 mg of methylprednisolone  IVP.   I added magnesium  sulfate 2 g IVPB.  Review of Systems: As mentioned in the history of present illness. All other systems reviewed and are negative. Past Medical History:  Diagnosis Date   Anxiety    Arthritis    COPD (chronic obstructive pulmonary disease) (HCC)    Esophageal stricture    s/p repeated dilations, Dr. Rosalie   GERD (gastroesophageal reflux disease)    Hearing loss bilateral   has hearing aids   Heart murmur    With no gross abnormalities on echocardiogram.   Hiatal hernia    History of blood transfusion    many years ago    Hyperlipidemia    Hypothyroidism    large b cell lymphoma 04/2019   Osteoarthritis    Osteoporosis    Paroxysmal atrial fibrillation (HCC)    1 confirm documented episode of A. fib; not on anticoagulation.   Pulmonary nodules    Renal lesion    1cm left kidney   Past Surgical History:  Procedure Laterality Date   ABDOMINAL HYSTERECTOMY     BREAST BIOPSY Left 10/19/2015   BREAST ENHANCEMENT SURGERY  01/10/2004   CATARACT EXTRACTION W/ INTRAOCULAR LENS  IMPLANT, BILATERAL  '09   ESOPHAGEAL DILATION     IR IMAGING GUIDED PORT INSERTION  11/05/2019   IR REMOVAL TUN ACCESS W/ PORT W/O FL MOD SED  05/03/2020   KNEE ARTHROSCOPY  02/22/2011   Procedure: ARTHROSCOPY KNEE;  Surgeon: Dempsey LULLA Moan, MD;  Location: [facility];  Service: Orthopedics;  Laterality: Right;  WITH DEBRIDEMENT    LAPAROSCOPIC SMALL BOWEL RESECTION N/A  06/02/2016   Procedure: DIAGNOSTIC LAPAROSCOPY SMALL BOWEL RESECTION;  Surgeon: Eletha Boas, MD;  Location: [facility];  Service: General;  Laterality: N/A;   TRANSTHORACIC ECHOCARDIOGRAM  03/24/2016   a) (In setting of COPD exacerbation-pneumonia) EF 60 to 65%. ~Appear to be in A. Fib.  Unable to assess diastolic function.  Normal wall motion.  Mild LA dilation.  Trivial MR.  Elevated PAP estimated 53 mmHg, RAP 15 mm.; b) 07/29/2018: Vigorous LV function, EF> 65%.  Normal wall motion.  GR 1 DD with elevated LAP.   Normal RV size and function.  Normal valves.  Trivial MR.   TRANSTHORACIC ECHOCARDIOGRAM  10/2019   EF 60 to 65%.  Normal LV size and function.  No R WMA.  GR 1 DD.  Normal PA/RVP with normal RV.  Normal RAP.  Grossly normal aortic and mitral valves.  No significant change from prior study.   Zio Patch Monitor-back-to-back 14 days (28-day total)  12/2020   Predominantly SR: HR range 47-102 bpm, AVG 63 bpm.  Rare PACs and PVCs.  9 atrial runs: Fastest 8 beats 139 bpm, longest 9 beats 90 bpm.  No sustained arrhythmias either fast or slow.  No pauses.  Symptoms noted with sinus rhythm and occasionally with sinus rhythm associated with PACs or PVCs.   Social History:  reports that she quit smoking about 26 years ago. Her smoking use included cigarettes. She started smoking about 56 years ago. She has a 30 pack-year smoking history. She has never used smokeless tobacco. She reports that she does not drink alcohol  and does not use drugs.  Allergies  Allergen Reactions   Other Anaphylaxis    Swelling in throat Bee Sting   Codeine Other (See Comments)    Reaction:  Hallucinations Other reaction(s): Confusion    Family History  Problem Relation Age of Onset   Heart disease Father    Rheum arthritis Father    Heart failure Mother    Diabetes Brother     Prior to Admission medications   Medication Sig Start Date End Date Taking? Authorizing Provider  albuterol  (VENTOLIN  HFA) 108 (90 Base) MCG/ACT inhaler Inhale 2 puffs into the lungs every 4 (four) hours as needed for wheezing or shortness of breath. 11/11/20   [provider]  allopurinol  (ZYLOPRIM ) 300 MG tablet Take 300 mg by mouth daily.    [provider]  ALPRAZolam  (XANAX ) 0.25 MG tablet Take 0.25 mg by mouth 2 (two) times daily.    [provider]  amLODipine  (NORVASC ) 5 MG tablet Take 5 mg by mouth daily. 09/12/19   [provider]  aspirin  EC 81 MG tablet Take 81 mg by mouth at bedtime.    [provider]  atorvastatin  (LIPITOR) 20 MG tablet Take 20 mg by mouth at bedtime.    [provider]  Cholecalciferol  (VITAMIN D3) 2000 UNITS TABS Take 2,000 Units by mouth at bedtime.     [provider]  fluticasone  (FLONASE ) 50 MCG/ACT nasal spray Administer 2 sprays in each nostril daily. 04/13/21   [provider]  furosemide  (LASIX ) 40 MG tablet Take 1 tablet (40 mg total) by mouth daily. 04/25/22 07/24/22  [place], Camellia PARAS, DO  gabapentin  (NEURONTIN ) 100 MG capsule TAKE 1 CAP EVERY MORNING, 1 CAP IN THE AFTERNOON, AND 2 CAPS EVERY DAY AT BEDTIME Patient taking differently: Take 100-200 mg by mouth See admin instructions. Take 100mg  by mouth every morning, 100mg  by mouth in the afternoon and 200mg  by mouth  daily at bedtime. 04/15/18   Sater, Charlie LABOR, MD  levothyroxine  (SYNTHROID ) 88 MCG tablet Take 88 mcg by mouth daily before breakfast. 03/01/21   [provider]  metoprolol  tartrate (LOPRESSOR ) 25 MG tablet Take 25 mg by mouth 2 (two) times daily.    [provider]  mirtazapine  (REMERON ) 7.5 MG tablet Take 7.5 mg by mouth at bedtime. 06/23/21   [provider]  mometasone -formoterol  (DULERA ) 200-5 MCG/ACT AERO Inhale 2 puffs into the lungs 2 (two) times daily. 04/25/22   [place], Camellia PARAS, DO  mupirocin ointment (BACTROBAN) 2 % Apply 1 Application topically 2 (two) times daily. 02/01/22   [provider]  neomycin-polymyxin b-dexamethasone  (MAXITROL) 3.5-10000-0.1 SUSP Place 1 drop into the left eye in the morning, at noon, in the evening, and at bedtime. 04/20/22   [provider]  pantoprazole  (PROTONIX ) 40 MG tablet Take 40 mg by mouth 2 (two) times daily.     [provider]  Potassium 99 MG TABS Take 99 mg by mouth at bedtime.    [provider]  traMADol  (ULTRAM ) 50 MG tablet Take 50 mg by mouth 2 (two) times daily as needed for moderate pain.     [provider]  vitamin B-12 (CYANOCOBALAMIN ) 1000  MCG tablet Take 1,000 mcg by mouth daily.    [provider]    Physical Exam: Vitals:   02/15/23 9177 02/15/23 0824 02/15/23 1000 02/15/23 1022  BP:   (!) 111/47   Pulse: 88  94   Resp: 15  15   Temp: 98.8 F (37.1 C) 98.3 F (36.8 C)  99.5 F (37.5 C)  TempSrc: Oral Oral  Rectal  SpO2: 95%  95%   Weight:      Height:       Physical Exam Vitals and nursing note reviewed.  Constitutional:      General: She is awake. She is not in acute distress.    Appearance: She is well-developed. She is ill-appearing.     Interventions: Nasal cannula in place.  HENT:     Head: Normocephalic.     Nose: No rhinorrhea.     Mouth/Throat:     Mouth: Mucous membranes are dry.  Eyes:     General: No scleral icterus.    Pupils: Pupils are equal, round, and reactive to light.  Neck:     Vascular: No JVD.  Cardiovascular:     Rate and Rhythm: Normal rate and regular rhythm.     Heart sounds: S1 normal and S2 normal.  Pulmonary:     Effort: Pulmonary effort is normal. No accessory muscle usage.     Breath sounds: Wheezing and rhonchi present. No rales.  Abdominal:     General: Bowel sounds are normal. There is no distension.     Palpations: Abdomen is soft.     Tenderness: There is no abdominal tenderness. There is no guarding.  Musculoskeletal:     Cervical back: Neck supple.     Right lower leg: No edema.     Left lower leg: No edema.  Skin:    General: Skin is warm and dry.  Neurological:     General: No focal deficit present.     Mental Status: She is alert and oriented to person, place, and time.  Psychiatric:        Mood and Affect: Mood normal.        Behavior: Behavior normal. Behavior is cooperative.     Data Reviewed:  Results  are pending, will review when available. 04/24/2022 echocardiogram report.   1. Left ventricular ejection fraction, by estimation, is 65 to 70%. The  left ventricle has normal function. The left ventricle has no regional  wall motion  abnormalities. There is mild concentric left ventricular  hypertrophy. Left ventricular diastolic  parameters are consistent with Grade I diastolic dysfunction (impaired  relaxation).   2. Right ventricular systolic function is low normal. The right  ventricular size is mildly enlarged. There is moderately elevated  pulmonary artery systolic pressure.   3. The mitral valve is normal in structure. Mild mitral valve  regurgitation. No evidence of mitral stenosis.   4. The aortic valve is normal in structure. Aortic valve regurgitation is  trivial. No aortic stenosis is present. Aortic valve area, by VTI measures  3.75 cm. Aortic valve mean gradient measures 8.5 mmHg. Aortic valve Vmax  measures 1.92 m/s.   5. The inferior vena cava is dilated in size with <50% respiratory  variability, suggesting right atrial pressure of 15 mmHg.   EKG: Vent. rate 89 BPM PR interval 169 ms QRS duration 96 ms QT/QTcB 353/430 ms P-R-T axes 68 70 53 Sinus rhythm Low voltage with right axis deviation  Assessment and Plan: Principal Problem:   Acute respiratory failure with hypoxia (HCC) In the setting of:   COPD with acute exacerbation (HCC) Observation/telemetry Continue supplemental oxygen. Methylprednisolone  125 mg IVP x1. Followed by prednisone  40 mg p.o. daily in a.m. Scheduled and as needed bronchodilators. Follow-up CBC and chemistry in the morning.   Active Problems:   Essential hypertension Continue amlodipine  5 mg p.o. daily.    Gout Continue allopurinol  300 mg p.o. daily.    Hypothyroidism Continue levothyroxine  88 mcg p.o. daily.    GERD (gastroesophageal reflux disease)   Esophageal stricture Continue pantoprazole  40 mg p.o. twice daily.    Hyperlipidemia Continue on atorvastatin  20 mg p.o. daily.    History of atrial fibrillation without current medication CHA?DS?-VASc Score of at least 6. (All risk factors, but DM or CVA). Not on anticoagulation. Continue metoprolol   25 mg p.o. twice daily for rate control.    Macrocytic anemia Monitor hematocrit hemoglobin. Already on cyanocobalamin  1000 mcg p.o. daily.     Advance Care Planning:   Code Status: Full Code   Consults:   Family Communication:   Severity of Illness: The appropriate patient status for this patient is OBSERVATION. Observation status is judged to be reasonable and necessary in order to provide the required intensity of service to ensure the patient's safety. The patient's presenting symptoms, physical exam findings, and initial radiographic and laboratory data in the context of their medical condition is felt to place them at decreased risk for further clinical deterioration. Furthermore, it is anticipated that the patient will be medically stable for discharge from the hospital within 2 midnights of admission.   Author: Alm Dorn Castor, MD 02/15/2023 10:46 AM  For on call review [email or web address].   This document was prepared using Dragon voice recognition software and may contain some unintended transcription errors.

## 2023-02-15 NOTE — ED Provider Notes (Signed)
 [place] EMERGENCY DEPARTMENT AT [facility] Provider Note   CSN: [id number] Arrival date & time: 02/15/23  9374     History  No chief complaint on file.   Amy [name] is a 88 y.o. female.  The history is provided by the patient and medical records. No language interpreter was used.     88 year old female history of COPD, anxiety, paroxysmal atrial fibrillation, pulmonary nodule, anemia brought here via EMS from home for complaints of shortness of breath. She endorsed having cough and sob 2/2 bronchitis ongoing x 3 weeks. Patient report worsening cough since last night.  Endorsed increased shortness of breath as well.  She also endorsed having urinary frequency but this is an ongoing issue for the past several months.  She voiced concerns for UTI.  She does not endorse any fever no hemoptysis no sore throat no congestion.  Denies any nausea vomiting diarrhea no chest pain no abdominal pain.  Does have history of COPD and uses inhalers.  She lives at home by herself.  Due to increasing cough, patient decided to come for evaluation.  No prior she of PE or DVT.  Home Medications Prior to Admission medications   Medication Sig Start Date End Date Taking? Authorizing Provider  albuterol  (VENTOLIN  HFA) 108 (90 Base) MCG/ACT inhaler Inhale 2 puffs into the lungs every 4 (four) hours as needed for wheezing or shortness of breath. 11/11/20   [provider]  allopurinol  (ZYLOPRIM ) 300 MG tablet Take 300 mg by mouth daily.    [provider]  ALPRAZolam  (XANAX ) 0.25 MG tablet Take 0.25 mg by mouth 2 (two) times daily.    [provider]  amLODipine  (NORVASC ) 5 MG tablet Take 5 mg by mouth daily. 09/12/19   [provider]  aspirin  EC 81 MG tablet Take 81 mg by mouth at bedtime.    [provider]  atorvastatin  (LIPITOR) 20 MG tablet Take 20 mg by mouth at bedtime.    [provider]  Cholecalciferol  (VITAMIN D3) 2000 UNITS TABS Take  2,000 Units by mouth at bedtime.     [provider]  fluticasone  (FLONASE ) 50 MCG/ACT nasal spray Administer 2 sprays in each nostril daily. 04/13/21   [provider]  furosemide  (LASIX ) 40 MG tablet Take 1 tablet (40 mg total) by mouth daily. 04/25/22 07/24/22  [place], Camellia PARAS, DO  gabapentin  (NEURONTIN ) 100 MG capsule TAKE 1 CAP EVERY MORNING, 1 CAP IN THE AFTERNOON, AND 2 CAPS EVERY DAY AT BEDTIME Patient taking differently: Take 100-200 mg by mouth See admin instructions. Take 100mg  by mouth every morning, 100mg  by mouth in the afternoon and 200mg  by mouth daily at bedtime. 04/15/18   Sater, Charlie LABOR, MD  levothyroxine  (SYNTHROID ) 88 MCG tablet Take 88 mcg by mouth daily before breakfast. 03/01/21   [provider]  metoprolol  tartrate (LOPRESSOR ) 25 MG tablet Take 25 mg by mouth 2 (two) times daily.    [provider]  mirtazapine  (REMERON ) 7.5 MG tablet Take 7.5 mg by mouth at bedtime. 06/23/21   [provider]  mometasone -formoterol  (DULERA ) 200-5 MCG/ACT AERO Inhale 2 puffs into the lungs 2 (two) times daily. 04/25/22   [place], Camellia PARAS, DO  mupirocin ointment (BACTROBAN) 2 % Apply 1 Application topically 2 (two) times daily. 02/01/22   [provider]  neomycin-polymyxin b-dexamethasone  (MAXITROL) 3.5-10000-0.1 SUSP Place 1 drop into the left eye in the morning, at noon, in the evening, and at bedtime. 04/20/22   [provider]  pantoprazole  (PROTONIX ) 40 MG tablet Take 40 mg by mouth 2 (two) times daily.     [provider]  Potassium 99 MG TABS Take 99 mg by mouth at bedtime.    [provider]  traMADol  (ULTRAM ) 50 MG tablet Take 50 mg by mouth 2 (two) times daily as needed for moderate pain.     [provider]  vitamin B-12 (CYANOCOBALAMIN ) 1000 MCG tablet Take 1,000 mcg by mouth daily.    [provider]      Allergies    Other and Codeine    Review of Systems   Review of Systems  All  other systems reviewed and are negative.   Physical Exam Updated Vital Signs BP (!) 111/47   Pulse 94   Temp 99.5 F (37.5 C) (Rectal)   Resp 15   Ht 5' 5 (1.651 m)   Wt 60.3 kg   SpO2 95%   BMI 22.13 kg/m  Physical Exam Vitals and nursing note reviewed.  Constitutional:      General: She is not in acute distress.    Appearance: She is well-developed.  HENT:     Head: Atraumatic.  Eyes:     Conjunctiva/sclera: Conjunctivae normal.  Cardiovascular:     Rate and Rhythm: Normal rate and regular rhythm.     Pulses: Normal pulses.     Heart sounds: Normal heart sounds.  Pulmonary:     Effort: Pulmonary effort is normal.     Breath sounds: Wheezing present. No rhonchi or rales.  Abdominal:     Palpations: Abdomen is soft.     Tenderness: There is no abdominal tenderness.  Musculoskeletal:     Cervical back: Neck supple.     Right lower leg: No edema.     Left lower leg: No edema.  Skin:    Findings: No rash.  Neurological:     Mental Status: She is alert.  Psychiatric:        Mood and Affect: Mood normal.     ED Results / Procedures / Treatments   Labs (all labs ordered are listed, but only abnormal results are displayed) Labs Reviewed  BASIC METABOLIC PANEL - Abnormal; Notable for the following components:      Result Value   Chloride 95 (*)    Glucose, Bld 101 (*)    Calcium  8.8 (*)    All other components within normal limits  D-DIMER, QUANTITATIVE - Abnormal; Notable for the following components:   D-Dimer, Quant 0.70 (*)    All other components within normal limits  URINALYSIS, ROUTINE W REFLEX MICROSCOPIC - Abnormal; Notable for the following components:   Leukocytes,Ua TRACE (*)    All other components within normal limits  CBC WITH DIFFERENTIAL/PLATELET - Abnormal; Notable for the following components:   RBC 3.19 (*)    Hemoglobin 10.4 (*)    HCT 32.3 (*)    MCV 101.3 (*)    Lymphs Abs 5.1 (*)    All other components within normal limits  RESP  PANEL BY RT-PCR (RSV, FLU A&B, COVID)  [id number]    EKG EKG Interpretation Date/Time:  Thursday February 15 2023 08:20:42 EST Ventricular Rate:  89 PR Interval:  169 QRS Duration:  96 QT Interval:  353 QTC Calculation: 430 R Axis:   70  Text Interpretation: Sinus rhythm Low voltage with right axis deviation No significant change since last tracing Confirmed by Doretha Folks ([id number]) on 02/15/2023 9:18:41 AM  Radiology DG Chest  2 View Result Date: 02/15/2023 CLINICAL DATA:  88 year old female with shortness of breath, chest pain. Bronchitis. EXAM: CHEST - 2 VIEW COMPARISON:  Chest radiographs 01/12/2023 and earlier. FINDINGS: Evidence of small layering pleural effusions which are new from last month. Mildly lower lung volumes. No pneumothorax or pulmonary edema. No consolidation. Stable cardiac size and mediastinal contours. Chronic borderline to mild cardiomegaly. No acute osseous abnormality identified. Stable cholecystectomy clips. Negative visible bowel gas. IMPRESSION: New small layering pleural effusions since last month, lower lung volumes. Electronically Signed   By: VEAR Hurst M.D.   On: 02/15/2023 08:30    Procedures .Critical Care  Performed by: Nivia Colon, PA-C Authorized by: Nivia Colon, PA-C   Critical care provider statement:    Critical care time (minutes):  35   Critical care was time spent personally by me on the following activities:  Development of treatment plan with patient or surrogate, discussions with consultants, evaluation of patient's response to treatment, examination of patient, ordering and review of laboratory studies, ordering and review of radiographic studies, ordering and performing treatments and interventions, pulse oximetry, re-evaluation of patient's condition and review of old charts     Medications Ordered in ED Medications  ipratropium-albuterol  (DUONEB) 0.5-2.5 (3) MG/3ML nebulizer solution 3 mL (3 mLs Nebulization Given 02/15/23 0809)   methylPREDNISolone  sodium succinate (SOLU-MEDROL ) 125 mg/2 mL injection 125 mg (125 mg Intravenous Given 02/15/23 9075)    ED Course/ Medical Decision Making/ A&P                                 Medical Decision Making Amount and/or Complexity of Data Reviewed Labs: ordered. Radiology: ordered.  Risk Prescription drug management.   BP (!) 116/49 (BP Location: Right Arm)   Pulse 88   Temp 98.6 F (37 C) (Oral)   Resp 18   Ht 5' 5 (1.651 m)   Wt 60.3 kg   SpO2 93%   BMI 22.13 kg/m   24:45 AM  88 year old female history of COPD, anxiety, paroxysmal atrial fibrillation, pulmonary nodule, anemia brought here via EMS from home for complaints of shortness of breath.  Patient report nonproductive cough since last night.  Endorsed increased shortness of breath as well.  She also endorsed having urinary frequency but this is an ongoing issue for the past several months.  She voiced concerns for UTI.  She does not endorse any fever no hemoptysis no sore throat no congestion.  Denies any nausea vomiting diarrhea no chest pain no abdominal pain.  Does have history of COPD and uses inhalers.  She lives at home by herself.  Due to increasing cough, patient decided to come for evaluation.  No prior she of PE or DVT.  On exam, patient is laying comfortably in bed appears to be in no acute discomfort.  She is wearing supplemental oxygen.  She does have some expiratory wheezes on exam but no rales or rhonchi.  She does not exhibit any peripheral edema.  Abdomen is soft nontender no CVA tenderness.  I turned down her O2 sats to room air, patient continued to communicate with me without any difficulty, speaking in complete sentences.  However, O2 sats did dip down to 89%.  I then reintroduced the nasal cannula and setting it at 2 L  -Labs ordered, independently viewed and interpreted by me.  Labs remarkable for elevated d-dimer of 0.70, however normal after age adjusted.  Doubt PE.   -  The patient was  maintained on a cardiac monitor.  I personally viewed and interpreted the cardiac monitored which showed an underlying rhythm of: NSR -Imaging independently viewed and interpreted by me and I agree with radiologist's interpretation.  Result remarkable for CXR showing new pleural effusion, however pt does not appear fluid overload -This patient presents to the ED for concern of sob, this involves an extensive number of treatment options, and is a complaint that carries with it a high risk of complications and morbidity.  The differential diagnosis includes COPD exacerbation, pneumonia, CHF, anemia, covid, flu, rsv, pe, acs -Co morbidities that complicate the patient evaluation includes COPD, anemia -Treatment includes duonebs, supplemental O2, solumedrol -Reevaluation of the patient after these medicines showed that the patient improved -PCP office notes or outside notes reviewed -Discussion with attending Dr. Doretha. I have also consulted with [facility] Dr. Celinda who will see and admit pt for COPD exacerbation with hypoxia -Escalation to admission/observation considered: patient is agreeable with admission.         Final Clinical Impression(s) / ED Diagnoses Final diagnoses:  Acute respiratory failure with hypoxia [facility])    Rx / DC Orders ED Discharge Orders     None         Nivia Colon, PA-C 02/15/23 1053    Doretha Folks, MD 02/15/23 1528

## 2023-02-15 NOTE — ED Triage Notes (Signed)
 Pt BIB Ems coming from home c/o of shob that has been going on for 3 weeks. Was diagnose with Bronchitis 3 weeks ago,states symptoms haven't completely resolved.Having a cough, and nausea,Believes could have possible UTI.  Denies chest pain,  88% on RA put on 2LNC up to 93%  EMS: BP 146/80, HR 90, Spo2 80 a% at RA then 95% [facility]

## 2023-02-16 DIAGNOSIS — J9601 Acute respiratory failure with hypoxia: Secondary | ICD-10-CM | POA: Diagnosis not present

## 2023-02-16 LAB — COMPREHENSIVE METABOLIC PANEL
ALT: 11 U/L (ref 0–44)
AST: 17 U/L (ref 15–41)
Albumin: 3.2 g/dL — ABNORMAL LOW (ref 3.5–5.0)
Alkaline Phosphatase: 80 U/L (ref 38–126)
Anion gap: 8 (ref 5–15)
BUN: 14 mg/dL (ref 8–23)
CO2: 29 mmol/L (ref 22–32)
Calcium: 8.9 mg/dL (ref 8.9–10.3)
Chloride: 95 mmol/L — ABNORMAL LOW (ref 98–111)
Creatinine, Ser: 0.57 mg/dL (ref 0.44–1.00)
GFR, Estimated: >60 mL/min (ref >60)
Glucose, Bld: 140 mg/dL — ABNORMAL HIGH (ref 70–99)
Potassium: 4.9 mmol/L (ref 3.5–5.1)
Sodium: 132 mmol/L — ABNORMAL LOW (ref 135–145)
Total Bilirubin: 0.8 mg/dL (ref 0.0–1.2)
Total Protein: 6 g/dL — ABNORMAL LOW (ref 6.5–8.1)

## 2023-02-16 LAB — CBC
HCT: 32.6 % — ABNORMAL LOW (ref 36.0–46.0)
Hemoglobin: 10.7 g/dL — ABNORMAL LOW (ref 12.0–15.0)
MCH: 32.4 pg (ref 26.0–34.0)
MCHC: 32.8 g/dL (ref 30.0–36.0)
MCV: 98.8 fL (ref 80.0–100.0)
Platelets: 197 10*3/uL (ref 150–400)
RBC: 3.3 MIL/uL — ABNORMAL LOW (ref 3.87–5.11)
RDW: 13.8 % (ref 11.5–15.5)
WBC: 8.4 10*3/uL (ref 4.0–10.5)
nRBC: 0 % (ref 0.0–0.2)

## 2023-02-16 MED ORDER — GUAIFENESIN-DM 100-10 MG/5ML PO SYRP
5.0000 mL | ORAL_SOLUTION | ORAL | Status: DC | PRN
  Administered 2023-02-16: 5 mL via ORAL
  Filled 2023-02-16: qty 5

## 2023-02-16 NOTE — Progress Notes (Signed)
 Mobility Specialist - Progress Note   02/16/23 1032  Therapy Vitals  BP (!) 148/65  Patient Position (if appropriate) Sitting  Oxygen Therapy  SpO2 (!) 82 %  O2 Device Room Air  Patient Activity (if Appropriate) Ambulating  Mobility  Activity Ambulated independently in hallway  Level of Assistance Independent  Assistive Device None  Distance Ambulated (ft) 275 ft  Activity Response Tolerated well  Mobility Referral Yes  Mobility visit 1 Mobility  Mobility Specialist Start Time (ACUTE ONLY) 1015  Mobility Specialist Stop Time (ACUTE ONLY) 1031  Mobility Specialist Time Calculation (min) (ACUTE ONLY) 16 min   Pt received in bed and agreeable to mobility. Pt desat to 82%. Encouraged pursed lip breaths bringing SpO2 to 96%. Despite SpO2 desaturation, pt stated she felt fine. No complaints during session. Pt to bed after session with all needs met.    During mobility: 82%-96% SpO2 (RA)  Amy [name]

## 2023-02-16 NOTE — Plan of Care (Signed)

## 2023-02-16 NOTE — Progress Notes (Signed)
 PROGRESS NOTE  Amy [name]  FMW:[id number] DOB: 31-Mar-1935 DOA: 02/15/2023 PCP: Debrah Josette ORN., PA-C   Brief Narrative: Patient is 88 year old female with history of anxiety, osteoporosis, COPD, GERD, hiatal hernia, hyperlipidemia, hypothyroidism, large B-cell lymphoma, paroxysmal A-fib who presented here with complaint of 3-week history of dyspnea, wheezing, nonproductive cough.  Hypoxia on arrival requiring 2 L of oxygen.  COVID/flu/RSV negative.  Chest x-ray showed low lung volumes, small bilateral pleural effusions.  Patient admitted for the management of COPD exacerbation  Assessment & Plan:  Principal Problem:   Acute respiratory failure with hypoxia (HCC) Active Problems:   Essential hypertension   GERD (gastroesophageal reflux disease)   Hypothyroidism   Esophageal stricture   Hyperlipidemia   History of atrial fibrillation without current medication   COPD with acute exacerbation (HCC)   Macrocytic anemia   Gout  Acute hypoxic respiratory failure: Secondary to COPD exertion.   Continue supplemental oxygen as needed, monitor but focus on monitoring on room air.  Continue bronchodilators as needed.On 2 L this mrng  COPD exacerbation: Patient with wheezing, shortness of breath.  History of COPD.  Continue prednisone , bronchodilators as needed.  Hypertension: On amlodipine  5 mg daily.  Currently blood pressure stable  Gout: Continue allopurinol   Hypothyroidism: Continue levothyroxine   GERD: Continue PPI  Paroxysmal A-fib: Currently not on anticoagulation.  On aspirin .  On metoprolol  twice daily for rate control.  On normal sinus rhythm  Macrocytic anemia: Currently hemoglobin stable.  Continue vitamin B12  Deconditioning: PT consulted        DVT prophylaxis:enoxaparin  (LOVENOX ) injection 40 mg Start: 02/15/23 2200     Code Status: Full Code  Family Communication: Discussed with daughter on phone on 12/7  Patient status: Observation  Patient is from : Home    Anticipated discharge un:Ynfz  Estimated DC date:tomorrow   Consultants: None  Procedures:None  Antimicrobials:  Anti-infectives (From admission, onward)    Start     Dose/Rate Route Frequency Ordered Stop   02/15/23 1030  cefTRIAXone  (ROCEPHIN ) 1 g in sodium chloride  0.9 % 100 mL IVPB  Status:  Discontinued        1 g 200 mL/hr over 30 Minutes Intravenous  Once 02/15/23 1024 02/15/23 1030   02/15/23 1030  azithromycin  (ZITHROMAX ) tablet 500 mg  Status:  Discontinued        500 mg Oral  Once 02/15/23 1024 02/15/23 1030       Subjective: Patient seen and examined at bedside today.  She was comfortable.  Lying in bed.  Not in any Respiratory distress.  She says she feels better.  Denies any worsening cough or shortness of breath.  On 2 L of oxygen which I weaned to room air.  Had a discussion with her daughter on phone at bedside  Objective: Vitals:   02/15/23 1712 02/15/23 2041 02/16/23 0112 02/16/23 0447  BP: (!) 126/58 (!) 118/54 124/60 (!) 112/49  Pulse: 92 (!) 102 86 75  Resp: 15 16 16 14   Temp: 98.2 F (36.8 C) 98.2 F (36.8 C) 97.6 F (36.4 C) 98.3 F (36.8 C)  TempSrc: Oral Oral Oral Oral  SpO2: 95% 97% 96% 95%  Weight:      Height:        Intake/Output Summary (Last 24 hours) at 02/16/2023 0810 Last data filed at 02/15/2023 1930 Gross per 24 hour  Intake 156.55 ml  Output --  Net 156.55 ml   Filed Weights   02/15/23 0644  Weight: 60.3 kg  Examination:  General exam: Overall comfortable, not in distress, pleasant elderly female HEENT: PERRL Respiratory system: Mildly diminished air  sounds bilaterally, mild expiratory wheezing Cardiovascular system: S1 & S2 heard, RRR.  Gastrointestinal system: Abdomen is nondistended, soft and nontender. Central nervous system: Alert and oriented Extremities: No edema, no clubbing ,no cyanosis Skin: No rashes, no ulcers,no icterus     Data Reviewed: I have personally reviewed following labs and imaging  studies  CBC: Recent Labs  Lab 02/15/23 0947 02/16/23 0606  WBC 9.1 8.4  NEUTROABS 3.1  --   HGB 10.4* 10.7*  HCT 32.3* 32.6*  MCV 101.3* 98.8  PLT 191 197   Basic Metabolic Panel: Recent Labs  Lab 02/15/23 0702 02/16/23 0606  NA 135 132*  K 4.1 4.9  CL 95* 95*  CO2 30 29  GLUCOSE 101* 140*  BUN 11 14  CREATININE 0.91 0.57  CALCIUM  8.8* 8.9     Recent Results (from the past 240 hours)  Resp panel by RT-PCR (RSV, Flu A&B, Covid) Anterior Nasal Swab     Status: None   Collection Time: 02/15/23  7:03 AM   Specimen: Anterior Nasal Swab  Result Value Ref Range Status   SARS Coronavirus 2 by RT PCR NEGATIVE NEGATIVE Final    Comment: (NOTE) SARS-CoV-2 target nucleic acids are NOT DETECTED.  The SARS-CoV-2 RNA is generally detectable in upper respiratory specimens during the acute phase of infection. The lowest concentration of SARS-CoV-2 viral copies this assay can detect is 138 copies/mL. A negative result does not preclude SARS-Cov-2 infection and should not be used as the sole basis for treatment or other patient management decisions. A negative result may occur with  improper specimen collection/handling, submission of specimen other than nasopharyngeal swab, presence of viral mutation(s) within the areas targeted by this assay, and inadequate number of viral copies(<138 copies/mL). A negative result must be combined with clinical observations, patient history, and epidemiological information. The expected result is Negative.  Fact Sheet for Patients:  bloggercourse.com  Fact Sheet for Healthcare Providers:  seriousbroker.it  This test is no t yet approved or cleared by the [place]  FDA and  has been authorized for detection and/or diagnosis of SARS-CoV-2 by FDA under an Emergency Use Authorization (EUA). This EUA will remain  in effect (meaning this test can be used) for the duration of the COVID-19  declaration under Section 564(b)(1) of the Act, 21 U.S.C.section 360bbb-3(b)(1), unless the authorization is terminated  or revoked sooner.       Influenza A by PCR NEGATIVE NEGATIVE Final   Influenza B by PCR NEGATIVE NEGATIVE Final    Comment: (NOTE) The Xpert Xpress SARS-CoV-2/FLU/RSV plus assay is intended as an aid in the diagnosis of influenza from Nasopharyngeal swab specimens and should not be used as a sole basis for treatment. Nasal washings and aspirates are unacceptable for Xpert Xpress SARS-CoV-2/FLU/RSV testing.  Fact Sheet for Patients: bloggercourse.com  Fact Sheet for Healthcare Providers: seriousbroker.it  This test is not yet approved or cleared by the [place]  FDA and has been authorized for detection and/or diagnosis of SARS-CoV-2 by FDA under an Emergency Use Authorization (EUA). This EUA will remain in effect (meaning this test can be used) for the duration of the COVID-19 declaration under Section 564(b)(1) of the Act, 21 U.S.C. section 360bbb-3(b)(1), unless the authorization is terminated or revoked.     Resp Syncytial Virus by PCR NEGATIVE NEGATIVE Final    Comment: (NOTE) Fact Sheet for Patients: bloggercourse.com  Fact  Sheet for Healthcare Providers: seriousbroker.it  This test is not yet approved or cleared by the [place]  FDA and has been authorized for detection and/or diagnosis of SARS-CoV-2 by FDA under an Emergency Use Authorization (EUA). This EUA will remain in effect (meaning this test can be used) for the duration of the COVID-19 declaration under Section 564(b)(1) of the Act, 21 U.S.C. section 360bbb-3(b)(1), unless the authorization is terminated or revoked.  Performed at [facility], [address], [place], [place] [address]      Radiology Studies: DG Chest 2 View Result Date: 02/15/2023 CLINICAL  DATA:  88 year old female with shortness of breath, chest pain. Bronchitis. EXAM: CHEST - 2 VIEW COMPARISON:  Chest radiographs 01/12/2023 and earlier. FINDINGS: Evidence of small layering pleural effusions which are new from last month. Mildly lower lung volumes. No pneumothorax or pulmonary edema. No consolidation. Stable cardiac size and mediastinal contours. Chronic borderline to mild cardiomegaly. No acute osseous abnormality identified. Stable cholecystectomy clips. Negative visible bowel gas. IMPRESSION: New small layering pleural effusions since last month, lower lung volumes. Electronically Signed   By: VEAR Hurst M.D.   On: 02/15/2023 08:30    Scheduled Meds:  allopurinol   300 mg Oral Daily   amLODipine   5 mg Oral Daily   aspirin  EC  81 mg Oral QHS   atorvastatin   20 mg Oral QHS   cyanocobalamin   1,000 mcg Oral Daily   enoxaparin  (LOVENOX ) injection  40 mg Subcutaneous Q24H   feeding supplement  237 mL Oral BID BM   gabapentin   100 mg Oral BID   gabapentin   200 mg Oral QHS   ipratropium-albuterol   3 mL Nebulization TID   levothyroxine   88 mcg Oral QAC breakfast   metoprolol  tartrate  25 mg Oral BID   mirabegron  ER  25 mg Oral Daily   mirtazapine   7.5 mg Oral QHS   pantoprazole   40 mg Oral BID   predniSONE   40 mg Oral Q breakfast   Continuous Infusions:   LOS: 0 days   Ivonne Mustache, MD [facility] P2/07/2023, 8:10 AM

## 2023-02-16 NOTE — Care Management Obs Status (Signed)
 MEDICARE OBSERVATION STATUS NOTIFICATION   Patient Details  Name: Amy [name] MRN: [id number] Date of Birth: Oct 24, 1935   Medicare Observation Status Notification Given:  Yes    Loreda Rodriguez, RN 02/16/2023, 10:40 AM

## 2023-02-17 ENCOUNTER — Other Ambulatory Visit (HOSPITAL_COMMUNITY): Payer: Self-pay

## 2023-02-17 ENCOUNTER — Encounter: Payer: Self-pay | Admitting: Hematology and Oncology

## 2023-02-17 DIAGNOSIS — J9601 Acute respiratory failure with hypoxia: Secondary | ICD-10-CM | POA: Diagnosis not present

## 2023-02-17 MED ORDER — FT TUSSIN DM ADULT 20-200 MG/20ML PO LIQD
5.0000 mL | ORAL | 0 refills | Status: AC | PRN
  Filled 2023-02-17: qty 236, 8d supply, fill #0

## 2023-02-17 MED ORDER — PREDNISONE 20 MG PO TABS
40.0000 mg | ORAL_TABLET | Freq: Every day | ORAL | 0 refills | Status: AC
  Filled 2023-02-17: qty 6, 3d supply, fill #0

## 2023-02-17 MED ORDER — IPRATROPIUM-ALBUTEROL 0.5-2.5 (3) MG/3ML IN SOLN
3.0000 mL | Freq: Four times a day (QID) | RESPIRATORY_TRACT | 0 refills | Status: AC | PRN
  Filled 2023-02-17: qty 90, 8d supply, fill #0

## 2023-02-17 NOTE — Progress Notes (Signed)
 TOC meds in a secure bag from [facility] pharmacy by this RN. Home meds retrieved from inpt pharmacy by this RN. All meds given to pt in room

## 2023-02-17 NOTE — TOC Transition Note (Signed)
 Transition of Care [facility]) - Discharge Note   Patient Details  Name: Amy [name] MRN: [id number] Date of Birth: Jun 13, 1935  [facility] CM/SW Contact:  Stefan JONELLE Cory, RN Phone Number: 02/17/2023, 1:30 PM   Clinical Narrative:   Per chart review patient currently seen for acute respiratory failure with hypoxia. DME: nebulizer order on file. This RNCM spoke with patient who reports prior to admision she did not have any HH services and no DME. Patient reports she is independent. This RNCM spoke with patient to offer choice for DME: nebulizer patient will accept any agency that accepts her insurance. Patient is agreeable to DME vendor to deliver to her home post discharge. Patient has additional questions re: use/care of nebulizer. This RNCM notified bedside RN who reports RT unable to educate due to DME is not at hospital. Notified Jermaine with [employer] who will deliver DME: nebulizer to patient's home and will have tech educate patient on use.   Transportation at discharge: patient will call her family to pick her up.     No additional TOC needs at this time.    Final next level of care: Home w Home Health Services (Home with DME: nebulizer) Barriers to Discharge: No Barriers Identified   Patient Goals and CMS Choice Patient states their goals for this hospitalization and ongoing recovery are:: To feel better CMS Medicare.gov Compare Post Acute Care list provided to:: Patient Choice offered to / list presented to : Patient  ownership interest in [facility].provided to::  (N/A)    Discharge Placement                       Discharge Plan and Services Additional resources added to the After Visit Summary for                  DME Arranged: Nebulizer machine DME Agency: [employer] Date DME Agency Contacted: 02/17/23 Time DME Agency Contacted: 1328 Representative spoke with at DME Agency: [place] HH Arranged: NA HH Agency: NA         Social Drivers of Health (SDOH) Interventions SDOH Screenings   Food Insecurity: No Food Insecurity (02/15/2023)  Housing: Unknown (02/15/2023)  Transportation Needs: No Transportation Needs (02/15/2023)  Utilities: Not At Risk (02/15/2023)  Social Connections: Socially Isolated (02/15/2023)  Tobacco Use: Medium Risk (02/15/2023)     Readmission Risk Interventions     No data to display

## 2023-02-17 NOTE — Evaluation (Signed)
 Physical Therapy Evaluation Patient Details Name: Amy [name] MRN: [id number] DOB: [birth year] Today's Date: 02/17/2023  History of Present Illness  Pt is an 88yo female presenting with 3-week history of SOB, fatigue, and malaise, requiring supplemental O2 of 2L. CXR new small layering pleural effusions. PMH: COPPD, GERD, HoH, HLD, hypothyroidism, OA, OP, hx of large B-cell lymphoma, PAF, anxiety, gout, HTN, anemia  Clinical Impression  Patient evaluated by Physical Therapy with no further acute PT needs identified. All education has been completed and the patient has no further questions.  Pt mod IND for bed mobility and transfers, ambulated in hallway at [facility] without device and completed stair training, see prior note for walking O2 saturations test. Pt performed Dynamic Gait Index and scored 22/24 indicating pt is at low risk of falls at this time. Educated pt on progressive walking program and purchase of SPC in the future for community ambulation and increased stability. Encouraged pt to have daughter to come check in on her regularly post-acutely. See below for any follow-up Physical Therapy or equipment needs. PT is signing off. Thank you for this referral.        If plan is discharge home, recommend the following:     Can travel by private vehicle        Equipment Recommendations None recommended by PT (Pt to purchase [facility])  Recommendations for Other Services       Functional Status Assessment Patient has not had a recent decline in their functional status     Precautions / Restrictions Precautions Precautions: Fall Restrictions Weight Bearing Restrictions Per Provider Order: No      Mobility  Bed Mobility Overal bed mobility: Modified Independent             General bed mobility comments: Increased time HOB elevated to 30    Transfers Overall transfer level: Modified independent Equipment used: None               General transfer comment: increased time     Ambulation/Gait Ambulation/Gait assistance: Contact guard assist Gait Distance (Feet): 500 Feet Assistive device: None Gait Pattern/deviations: WFL(Within Functional Limits) Gait velocity: decreased     General Gait Details: Pt ambulated with CGA without AD and without assist for O2 walking test, no gait deviations noted  Stairs Stairs: Yes Stairs assistance: Contact guard assist Stair Management: No rails, Alternating pattern, Forwards Number of Stairs: 3 General stair comments: Pt instructed on stair mobility, no overt LOB noted nor physical assist required, CGA only, pt demonstrated safe techniwuie  Wheelchair Mobility     Tilt Bed    Modified Rankin (Stroke Patients Only)       Balance Overall balance assessment: Modified Independent, No apparent balance deficits (not formally assessed)                               Standardized Balance Assessment Standardized Balance Assessment : Dynamic Gait Index   Dynamic Gait Index Level Surface: Normal Change in Gait Speed: Normal Gait with Horizontal Head Turns: Normal Gait with Vertical Head Turns: Normal Gait and Pivot Turn: Normal Step Over Obstacle: Normal Step Around Obstacles: Mild Impairment Steps: Mild Impairment Total Score: 22       Pertinent Vitals/Pain Pain Assessment Pain Assessment: No/denies pain    Home Living Family/patient expects to be discharged to:: Private residence Living Arrangements: Alone Available Help at Discharge: Family;Available PRN/intermittently Type of Home: House Home Access: Stairs to enter  Entrance Stairs-Rails: Left Entrance Stairs-Number of Steps: 3   Home Layout: One level Home Equipment: Shower seat;Hand held shower head Additional Comments: Daughter about 25 min drive away.    Prior Function Prior Level of Function : Driving             Mobility Comments: independent ADLs Comments: IND     Extremity/Trunk Assessment   Upper Extremity  Assessment Upper Extremity Assessment: Overall WFL for tasks assessed    Lower Extremity Assessment Lower Extremity Assessment: Overall WFL for tasks assessed    Cervical / Trunk Assessment Cervical / Trunk Assessment: Kyphotic  Communication   Communication Communication: No apparent difficulties  Cognition Arousal: Alert Behavior During Therapy: WFL for tasks assessed/performed Overall Cognitive Status: Within Functional Limits for tasks assessed                                          General Comments      Exercises     Assessment/Plan    PT Assessment All further PT needs can be met in the next venue of care  PT Problem List Decreased activity tolerance;Decreased mobility;Decreased knowledge of use of DME       PT Treatment Interventions      PT Goals (Current goals can be found in the Care Plan section)       Frequency       Co-evaluation               AM-PAC PT 6 Clicks Mobility  Outcome Measure Help needed turning from your back to your side while in a flat bed without using bedrails?: None Help needed moving from lying on your back to sitting on the side of a flat bed without using bedrails?: None Help needed moving to and from a bed to a chair (including a wheelchair)?: None Help needed standing up from a chair using your arms (e.g., wheelchair or bedside chair)?: None Help needed to walk in hospital room?: A Little Help needed climbing 3-5 steps with a railing? : A Little 6 Click Score: 22    End of Session Equipment Utilized During Treatment: Gait belt Activity Tolerance: Patient tolerated treatment well;No increased pain Patient left: in bed;with call bell/phone within reach Nurse Communication: Mobility status PT Visit Diagnosis: Other abnormalities of gait and mobility (R26.89)    Time: 9043-8976 PT Time Calculation (min) (ACUTE ONLY): 27 min   Charges:   PT Evaluation $PT Eval Low Complexity: 1 Low PT  Treatments $Gait Training: 8-22 mins PT General Charges $$ ACUTE PT VISIT: 1 Visit         Elsie Grieves, PT, DPT [facility] Office: [phone]  Elsie Grieves 02/17/2023, 10:25 AM

## 2023-02-17 NOTE — Progress Notes (Signed)
 SATURATION QUALIFICATIONS: (This note is used to comply with regulatory documentation for home oxygen)  Patient Saturations on Room Air at Rest = 93%  Patient Saturations on Room Air while Ambulating = 88-92%*  *Pt returned from 88 to 92% on RA within 15s with pursed lip breathing alone and did not require supplemental O2 to return to 92%, pt with mild wheezing and without report of SOB.  Please briefly explain why patient needs home oxygen: Defer to MD decision.  Elsie Grieves, PT, DPT [facility] Office: [phone]

## 2023-02-17 NOTE — Plan of Care (Signed)

## 2023-02-17 NOTE — Discharge Summary (Signed)
 Physician Discharge Summary  Amy [name] FMW:[id number] DOB: [birth year] DOA: 02/15/2023  PCP: Debrah Josette ORN., PA-C  Admit date: 02/15/2023 Discharge date: 02/17/2023  Admitted From: Home Disposition:  Home  Discharge Condition:Stable CODE STATUS:FULL Diet recommendation: Heart Healthy   Brief/Interim Summary: Patient is 88 year old female with history of anxiety, osteoporosis, COPD, GERD, hiatal hernia, hyperlipidemia, hypothyroidism, large B-cell lymphoma, paroxysmal A-fib who presented here with complaint of 3-week history of dyspnea, wheezing, nonproductive cough. Hypoxia on arrival requiring 2 L of oxygen. COVID/flu/RSV negative. Chest x-ray showed low lung volumes, small bilateral pleural effusions. Patient admitted for the management of COPD exacerbation , started on steroids.  She has been weaned to room air now.  Feels most comfortable.  No worsening shortness of breath or cough.  Patient was seen by physical therapy today and she ambulated on room air.  Medically stable for discharge today to home.  Following problems were addressed during the hospitalization:  Acute hypoxic respiratory failure: Secondary to COPD exacerbation.  Currently weaned to room air  COPD exacerbation: Presented with wheezing, shortness of breath.  History of COPD.  Continue prednisone .  Continue home inhalers.  Also prescribed nebulizer machine.  Minimal wheezing today.   Hypertension: On amlodipine  5 mg, metoprolol  .  Currently blood pressure stable   Gout: Continue allopurinol    Hypothyroidism: Continue levothyroxine    GERD: Continue PPI   Paroxysmal A-fib: Currently not on anticoagulation.  On aspirin .  On metoprolol  twice daily for rate control.  On normal sinus rhythm   Macrocytic anemia: Currently hemoglobin stable.  Continue vitamin B12   Deconditioning: PT consulted, no follow-up recommended     Discharge Diagnoses:  Principal Problem:   Acute respiratory failure with hypoxia  (HCC) Active Problems:   Essential hypertension   GERD (gastroesophageal reflux disease)   Hypothyroidism   Esophageal stricture   Hyperlipidemia   History of atrial fibrillation without current medication   COPD with acute exacerbation (HCC)   Macrocytic anemia   Gout    Discharge Instructions  Discharge Instructions     Diet - low sodium heart healthy   Complete by: As directed    Discharge instructions   Complete by: As directed    1)Please take your medications as instructed 2)Follow up with your PCP in a week   Increase activity slowly   Complete by: As directed       Allergies as of 02/17/2023       Reactions   Other Anaphylaxis   Swelling in throat Bee Sting   Codeine Other (See Comments)   Reaction:  Hallucinations Other reaction(s): Confusion        Medication List     TAKE these medications    albuterol  108 (90 Base) MCG/ACT inhaler Commonly known as: VENTOLIN  HFA Inhale 2 puffs into the lungs See admin instructions. Inhale 1-2 puffs by mouth two to three times a day.   allopurinol  300 MG tablet Commonly known as: ZYLOPRIM  Take 300 mg by mouth daily.   ALPRAZolam  0.25 MG tablet Commonly known as: XANAX  Take 0.25 mg by mouth daily as needed for anxiety.   amLODipine  5 MG tablet Commonly known as: NORVASC  Take 5 mg by mouth daily.   aspirin  EC 81 MG tablet Take 81 mg by mouth at bedtime.   atorvastatin  20 MG tablet Commonly known as: LIPITOR Take 20 mg by mouth at bedtime.   cyanocobalamin  1000 MCG tablet Commonly known as: VITAMIN B12 Take 1,000 mcg by mouth daily.   Dulera  200-5 MCG/ACT  Aero Generic drug: mometasone -formoterol  Inhale 2 puffs into the lungs 2 (two) times daily. What changed:  when to take this reasons to take this   gabapentin  100 MG capsule Commonly known as: NEURONTIN  TAKE 1 CAP EVERY MORNING, 1 CAP IN THE AFTERNOON, AND 2 CAPS EVERY DAY AT BEDTIME What changed: See the new instructions.   Gemtesa 75 MG  Tabs Generic drug: Vibegron Take 75 mg by mouth daily.   guaiFENesin -dextromethorphan  100-10 MG/5ML syrup Commonly known as: ROBITUSSIN DM Take 5 mLs by mouth every 4 (four) hours as needed for cough.   ipratropium-albuterol  0.5-2.5 (3) MG/3ML Soln Commonly known as: DUONEB Take 3 mLs by nebulization every 6 (six) hours as needed.   levothyroxine  88 MCG tablet Commonly known as: SYNTHROID  Take 88 mcg by mouth daily before breakfast.   metoprolol  tartrate 25 MG tablet Commonly known as: LOPRESSOR  Take 25 mg by mouth 2 (two) times daily.   mirtazapine  7.5 MG tablet Commonly known as: REMERON  Take 7.5 mg by mouth at bedtime.   mupirocin ointment 2 % Commonly known as: BACTROBAN Apply 1 Application topically 2 (two) times daily as needed (Dermatitis).   neomycin-polymyxin b-dexamethasone  3.5-10000-0.1 Susp Commonly known as: MAXITROL Place 1 drop into both eyes See admin instructions. Place one drop into both eyes three to four times daily for dry eye.   pantoprazole  40 MG tablet Commonly known as: PROTONIX  Take 40 mg by mouth 2 (two) times daily.   Potassium 99 MG Tabs Take 99 mg by mouth at bedtime.   predniSONE  20 MG tablet Commonly known as: DELTASONE  Take 2 tablets (40 mg total) by mouth daily with breakfast for 3 days. Start taking on: February 18, 2023   traMADol  50 MG tablet Commonly known as: ULTRAM  Take 50 mg by mouth See admin instructions. 2-3x   Vitamin D3 50 MCG (2000 UT) Tabs Take 2,000 Units by mouth at bedtime.               Durable Medical Equipment  (From admission, onward)           Start     Ordered   02/17/23 1036  For home use only DME Nebulizer machine  Once       Question Answer Comment  Patient needs a nebulizer to treat with the following condition COPD exacerbation (HCC)   Length of Need Lifetime   Additional equipment included Administration kit   Additional equipment included Filter      02/17/23 1035             Follow-up Information     Debrah Josette ORN., PA-C. Schedule an appointment as soon as possible for a visit in 1 week(s).   Specialty: Family Medicine Contact information: [address] [place] [address] [phone]                Allergies  Allergen Reactions   Other Anaphylaxis    Swelling in throat Bee Sting   Codeine Other (See Comments)    Reaction:  Hallucinations Other reaction(s): Confusion    Consultations: None   Procedures/Studies: DG Chest 2 View Result Date: 02/15/2023 CLINICAL DATA:  88 year old female with shortness of breath, chest pain. Bronchitis. EXAM: CHEST - 2 VIEW COMPARISON:  Chest radiographs 01/12/2023 and earlier. FINDINGS: Evidence of small layering pleural effusions which are new from last month. Mildly lower lung volumes. No pneumothorax or pulmonary edema. No consolidation. Stable cardiac size and mediastinal contours. Chronic borderline to mild cardiomegaly. No acute osseous abnormality  identified. Stable cholecystectomy clips. Negative visible bowel gas. IMPRESSION: New small layering pleural effusions since last month, lower lung volumes. Electronically Signed   By: VEAR Hurst M.D.   On: 02/15/2023 08:30      Subjective: Patient seen and examined at bedside today.  Hemodynamically stable.  On room air. No worsening shortness breath or cough.  Eager to go home.  I called and discussed about discharge planning with her daughter Olam on phone   Discharge Exam: Vitals:   02/17/23 0800 02/17/23 0952  BP:  123/68  Pulse:    Resp:  17  Temp:    SpO2: 94%    Vitals:   02/17/23 0532 02/17/23 0620 02/17/23 0800 02/17/23 0952  BP: (!) 104/48   123/68  Pulse: 66     Resp: 17   17  Temp: 98.2 F (36.8 C)     TempSrc: Oral     SpO2: 91% 91% 94%   Weight:      Height:        General: Pt is alert, awake, not in acute distress Cardiovascular: RRR, S1/S2 +, no rubs, no gallops Respiratory: Minimal expiratory wheezing Abdominal:  Soft, NT, ND, bowel sounds + Extremities: no edema, no cyanosis    The results of significant diagnostics from this hospitalization (including imaging, microbiology, ancillary and laboratory) are listed below for reference.     Microbiology: Recent Results (from the past 240 hours)  Resp panel by RT-PCR (RSV, Flu A&B, Covid) Anterior Nasal Swab     Status: None   Collection Time: 02/15/23  7:03 AM   Specimen: Anterior Nasal Swab  Result Value Ref Range Status   SARS Coronavirus 2 by RT PCR NEGATIVE NEGATIVE Final    Comment: (NOTE) SARS-CoV-2 target nucleic acids are NOT DETECTED.  The SARS-CoV-2 RNA is generally detectable in upper respiratory specimens during the acute phase of infection. The lowest concentration of SARS-CoV-2 viral copies this assay can detect is 138 copies/mL. A negative result does not preclude SARS-Cov-2 infection and should not be used as the sole basis for treatment or other patient management decisions. A negative result may occur with  improper specimen collection/handling, submission of specimen other than nasopharyngeal swab, presence of viral mutation(s) within the areas targeted by this assay, and inadequate number of viral copies(<138 copies/mL). A negative result must be combined with clinical observations, patient history, and epidemiological information. The expected result is Negative.  Fact Sheet for Patients:  bloggercourse.com  Fact Sheet for Healthcare Providers:  seriousbroker.it  This test is no t yet approved or cleared by the [place]  FDA and  has been authorized for detection and/or diagnosis of SARS-CoV-2 by FDA under an Emergency Use Authorization (EUA). This EUA will remain  in effect (meaning this test can be used) for the duration of the COVID-19 declaration under Section 564(b)(1) of the Act, 21 U.S.C.section 360bbb-3(b)(1), unless the authorization is terminated  or  revoked sooner.       Influenza A by PCR NEGATIVE NEGATIVE Final   Influenza B by PCR NEGATIVE NEGATIVE Final    Comment: (NOTE) The Xpert Xpress SARS-CoV-2/FLU/RSV plus assay is intended as an aid in the diagnosis of influenza from Nasopharyngeal swab specimens and should not be used as a sole basis for treatment. Nasal washings and aspirates are unacceptable for Xpert Xpress SARS-CoV-2/FLU/RSV testing.  Fact Sheet for Patients: bloggercourse.com  Fact Sheet for Healthcare Providers: seriousbroker.it  This test is not yet approved or cleared by the [place]  FDA  and has been authorized for detection and/or diagnosis of SARS-CoV-2 by FDA under an Emergency Use Authorization (EUA). This EUA will remain in effect (meaning this test can be used) for the duration of the COVID-19 declaration under Section 564(b)(1) of the Act, 21 U.S.C. section 360bbb-3(b)(1), unless the authorization is terminated or revoked.     Resp Syncytial Virus by PCR NEGATIVE NEGATIVE Final    Comment: (NOTE) Fact Sheet for Patients: bloggercourse.com  Fact Sheet for Healthcare Providers: seriousbroker.it  This test is not yet approved or cleared by the [place]  FDA and has been authorized for detection and/or diagnosis of SARS-CoV-2 by FDA under an Emergency Use Authorization (EUA). This EUA will remain in effect (meaning this test can be used) for the duration of the COVID-19 declaration under Section 564(b)(1) of the Act, 21 U.S.C. section 360bbb-3(b)(1), unless the authorization is terminated or revoked.  Performed at [facility], [address] [place], [place] [address]      Labs: BNP (last 3 results) Recent Labs    11/26/22 1234  BNP 53.0   Basic Metabolic Panel: Recent Labs  Lab 02/15/23 0702 02/16/23 0606  NA 135 132*  K 4.1 4.9  CL 95* 95*  CO2 30  29  GLUCOSE 101* 140*  BUN 11 14  CREATININE 0.91 0.57  CALCIUM  8.8* 8.9   Liver Function Tests: Recent Labs  Lab 02/16/23 0606  AST 17  ALT 11  ALKPHOS 80  BILITOT 0.8  PROT 6.0*  ALBUMIN 3.2*   No results for input(s): LIPASE, AMYLASE in the last 168 hours. No results for input(s): AMMONIA in the last 168 hours. CBC: Recent Labs  Lab 02/15/23 0947 02/16/23 0606  WBC 9.1 8.4  NEUTROABS 3.1  --   HGB 10.4* 10.7*  HCT 32.3* 32.6*  MCV 101.3* 98.8  PLT 191 197   Cardiac Enzymes: No results for input(s): CKTOTAL, CKMB, CKMBINDEX, TROPONINI in the last 168 hours. BNP: Invalid input(s): POCBNP CBG: No results for input(s): GLUCAP in the last 168 hours. D-Dimer Recent Labs    02/15/23 0702  DDIMER 0.70*   Hgb A1c No results for input(s): HGBA1C in the last 72 hours. Lipid Profile No results for input(s): CHOL, HDL, LDLCALC, TRIG, CHOLHDL, LDLDIRECT in the last 72 hours. Thyroid  function studies No results for input(s): TSH, T4TOTAL, T3FREE, THYROIDAB in the last 72 hours.  Invalid input(s): FREET3 Anemia work up No results for input(s): VITAMINB12, FOLATE, FERRITIN, TIBC, IRON, RETICCTPCT in the last 72 hours. Urinalysis    Component Value Date/Time   COLORURINE YELLOW 02/15/2023 0937   APPEARANCEUR CLEAR 02/15/2023 0937   LABSPEC 1.014 02/15/2023 0937   PHURINE 5.0 02/15/2023 0937   GLUCOSEU NEGATIVE 02/15/2023 0937   HGBUR NEGATIVE 02/15/2023 0937   BILIRUBINUR NEGATIVE 02/15/2023 0937   KETONESUR NEGATIVE 02/15/2023 0937   PROTEINUR NEGATIVE 02/15/2023 0937   UROBILINOGEN 0.2 12/14/2012 0541   NITRITE NEGATIVE 02/15/2023 0937   LEUKOCYTESUR TRACE (A) 02/15/2023 0937   Sepsis Labs Recent Labs  Lab 02/15/23 0947 02/16/23 0606  WBC 9.1 8.4   Microbiology Recent Results (from the past 240 hours)  Resp panel by RT-PCR (RSV, Flu A&B, Covid) Anterior Nasal Swab     Status: None   Collection  Time: 02/15/23  7:03 AM   Specimen: Anterior Nasal Swab  Result Value Ref Range Status   SARS Coronavirus 2 by RT PCR NEGATIVE NEGATIVE Final    Comment: (NOTE) SARS-CoV-2 target nucleic acids are NOT DETECTED.  The  SARS-CoV-2 RNA is generally detectable in upper respiratory specimens during the acute phase of infection. The lowest concentration of SARS-CoV-2 viral copies this assay can detect is 138 copies/mL. A negative result does not preclude SARS-Cov-2 infection and should not be used as the sole basis for treatment or other patient management decisions. A negative result may occur with  improper specimen collection/handling, submission of specimen other than nasopharyngeal swab, presence of viral mutation(s) within the areas targeted by this assay, and inadequate number of viral copies(<138 copies/mL). A negative result must be combined with clinical observations, patient history, and epidemiological information. The expected result is Negative.  Fact Sheet for Patients:  bloggercourse.com  Fact Sheet for Healthcare Providers:  seriousbroker.it  This test is no t yet approved or cleared by the [place]  FDA and  has been authorized for detection and/or diagnosis of SARS-CoV-2 by FDA under an Emergency Use Authorization (EUA). This EUA will remain  in effect (meaning this test can be used) for the duration of the COVID-19 declaration under Section 564(b)(1) of the Act, 21 U.S.C.section 360bbb-3(b)(1), unless the authorization is terminated  or revoked sooner.       Influenza A by PCR NEGATIVE NEGATIVE Final   Influenza B by PCR NEGATIVE NEGATIVE Final    Comment: (NOTE) The Xpert Xpress SARS-CoV-2/FLU/RSV plus assay is intended as an aid in the diagnosis of influenza from Nasopharyngeal swab specimens and should not be used as a sole basis for treatment. Nasal washings and aspirates are unacceptable for Xpert Xpress  SARS-CoV-2/FLU/RSV testing.  Fact Sheet for Patients: bloggercourse.com  Fact Sheet for Healthcare Providers: seriousbroker.it  This test is not yet approved or cleared by the [place]  FDA and has been authorized for detection and/or diagnosis of SARS-CoV-2 by FDA under an Emergency Use Authorization (EUA). This EUA will remain in effect (meaning this test can be used) for the duration of the COVID-19 declaration under Section 564(b)(1) of the Act, 21 U.S.C. section 360bbb-3(b)(1), unless the authorization is terminated or revoked.     Resp Syncytial Virus by PCR NEGATIVE NEGATIVE Final    Comment: (NOTE) Fact Sheet for Patients: bloggercourse.com  Fact Sheet for Healthcare Providers: seriousbroker.it  This test is not yet approved or cleared by the [place]  FDA and has been authorized for detection and/or diagnosis of SARS-CoV-2 by FDA under an Emergency Use Authorization (EUA). This EUA will remain in effect (meaning this test can be used) for the duration of the COVID-19 declaration under Section 564(b)(1) of the Act, 21 U.S.C. section 360bbb-3(b)(1), unless the authorization is terminated or revoked.  Performed at [facility], [address] [place], [place] [address]     Please note: You were cared for by a hospitalist during your hospital stay. Once you are discharged, your primary care physician will handle any further medical issues. Please note that NO REFILLS for any discharge medications will be authorized once you are discharged, as it is imperative that you return to your primary care physician (or establish a relationship with a primary care physician if you do not have one) for your post hospital discharge needs so that they can reassess your need for medications and monitor your lab values.    Time coordinating discharge: 40  minutes  SIGNED:   Ivonne Mustache, MD  [facility] 02/17/2023, 10:40 AM Pager [phone]  If 7PM-7AM, please contact night-coverage [email or web address] Password [email or web address]

## 2023-02-19 ENCOUNTER — Other Ambulatory Visit (HOSPITAL_COMMUNITY): Payer: Self-pay

## 2023-03-29 ENCOUNTER — Other Ambulatory Visit: Payer: Self-pay | Admitting: Family Medicine

## 2023-03-29 DIAGNOSIS — E2839 Other primary ovarian failure: Secondary | ICD-10-CM

## 2023-03-29 DIAGNOSIS — Z1231 Encounter for screening mammogram for malignant neoplasm of breast: Secondary | ICD-10-CM

## 2023-04-09 ENCOUNTER — Other Ambulatory Visit (HOSPITAL_COMMUNITY): Payer: Self-pay

## 2023-06-12 ENCOUNTER — Other Ambulatory Visit: Payer: Medicare Other

## 2023-06-12 ENCOUNTER — Inpatient Hospital Stay: Payer: Medicare Other | Attending: Hematology and Oncology | Admitting: Hematology and Oncology

## 2023-06-12 ENCOUNTER — Inpatient Hospital Stay

## 2023-06-12 VITALS — BP 116/61 | HR 69 | Temp 97.9°F | Resp 13 | Wt 125.9 lb

## 2023-06-12 DIAGNOSIS — C8331 Diffuse large B-cell lymphoma, lymph nodes of head, face, and neck: Secondary | ICD-10-CM

## 2023-06-12 DIAGNOSIS — C83398 Diffuse large b-cell lymphoma of other extranodal and solid organ sites: Secondary | ICD-10-CM | POA: Insufficient documentation

## 2023-06-12 LAB — CBC WITH DIFFERENTIAL (CANCER CENTER ONLY)
Abs Immature Granulocytes: 0.01 10*3/uL (ref 0.00–0.07)
Basophils Absolute: 0 10*3/uL (ref 0.0–0.1)
Basophils Relative: 0 %
Eosinophils Absolute: 0.2 10*3/uL (ref 0.0–0.5)
Eosinophils Relative: 2 %
HCT: 37.2 % (ref 36.0–46.0)
Hemoglobin: 12.1 g/dL (ref 12.0–15.0)
Immature Granulocytes: 0 %
Lymphocytes Relative: 44 %
Lymphs Abs: 3.6 10*3/uL (ref 0.7–4.0)
MCH: 31.8 pg (ref 26.0–34.0)
MCHC: 32.5 g/dL (ref 30.0–36.0)
MCV: 97.6 fL (ref 80.0–100.0)
Monocytes Absolute: 0.7 10*3/uL (ref 0.1–1.0)
Monocytes Relative: 8 %
Neutro Abs: 3.7 10*3/uL (ref 1.7–7.7)
Neutrophils Relative %: 46 %
Platelet Count: 269 10*3/uL (ref 150–400)
RBC: 3.81 MIL/uL — ABNORMAL LOW (ref 3.87–5.11)
RDW: 14.9 % (ref 11.5–15.5)
WBC Count: 8.2 10*3/uL (ref 4.0–10.5)
nRBC: 0 % (ref 0.0–0.2)

## 2023-06-12 LAB — CMP (CANCER CENTER ONLY)
ALT: 18 U/L (ref 0–44)
AST: 24 U/L (ref 15–41)
Albumin: 4.1 g/dL (ref 3.5–5.0)
Alkaline Phosphatase: 109 U/L (ref 38–126)
Anion gap: 3 — ABNORMAL LOW (ref 5–15)
BUN: 7 mg/dL — ABNORMAL LOW (ref 8–23)
CO2: 35 mmol/L — ABNORMAL HIGH (ref 22–32)
Calcium: 9.2 mg/dL (ref 8.9–10.3)
Chloride: 99 mmol/L (ref 98–111)
Creatinine: 0.75 mg/dL (ref 0.44–1.00)
GFR, Estimated: >60 mL/min (ref >60)
Glucose, Bld: 93 mg/dL (ref 70–99)
Potassium: 4.2 mmol/L (ref 3.5–5.1)
Sodium: 137 mmol/L (ref 135–145)
Total Bilirubin: 1 mg/dL (ref 0.0–1.2)
Total Protein: 6.9 g/dL (ref 6.5–8.1)

## 2023-06-12 LAB — LACTATE DEHYDROGENASE: LDH: 143 U/L (ref 98–192)

## 2023-06-12 NOTE — Progress Notes (Signed)
 [facility] OFFICE PROGRESS NOTE  Lory Rough., PA-C [address] [place] [address]  DIAGNOSIS:  # Diffuse Large B Cell Lymphoma, Stage II 1) 09/26/2019: CT scan of neck performed due to persistent tonsillar lesion after outpatient antibiotic course. CT scan showed a heterogeneous right tonsillar mass up to 3.2 cm and a small but suspicious right level 2A lymph node  2) 09/30/2019: patient underwent biopsy with ENT which revealed a DLBCL with FISH for MYC, BCL2 and BCL6 pending 3) 10/08/2019: establish care with Dr. Rosaline Coma  4) 10/21/2019: PET scan showed hypermetabolic right tonsillar mass and adjacent right level 2 adenopathy, consistent with the given history of lymphoma. 5) 11/13/2019: Cycle 1 Day 1 of R-miniCHOP 6) 12/05/2019: Cycle 2 Day 1 of R-miniCHOP 7) 12/25/2019: Cycle 3 Day 1 of R-miniCHOP 8) 02/04/2020-02/27/2020: radiation therapy.  9) 12/27/2020: CT Chest/Ab/Pelvis/Neck showed no evidence of disease recurrence/progression.  10) 01/07/2022: CT Chest/Ab/Pelvis/Neck showed no evidence of disease recurrence/progression.   CURRENT THERAPY: Surveillance   INTERVAL HISTORY: Amy [name] 88 y.o. female returns to the clinic today for a routine follow-up visit.  The patient has a history of diffuse large B-cell lymphoma. She is currently on observation.  She was last seen on 01/12/2023.  On exam today Amy [name] reports she has been "so-so" a.m. since her last visit.  She reports she has been having episodes of cystitis and is currently taking antibiotics for it.  She reports she had a similar bout in the 1990s.  She reports that otherwise she has been in good health.  She denies any bumps or lumps concerning for lymphadenopathy.  She reports that she checks on a routine basis for enlarged lymph nodes.  She reports she has had no recent infections such as flu, norovirus, or coronavirus.  She reports her energy is about 8.5 out of 10.  She notes that she has had no  trouble with bleeding, bruising, or dark stools.  She notes that she is not planning any major travel this year with exception of the [place].  She has lost about 20 pounds since her last visit which she reports her PCP believes to be secondary to thyroid  dysfunction.  This is currently being addressed with her primary care provider.  She otherwise denies any fevers, chills, sweats, nausea, Oni or diarrhea.  A full 10 point ROS is otherwise negative.   MEDICAL HISTORY: Past Medical History:  Diagnosis Date   Anxiety    Arthritis    COPD (chronic obstructive pulmonary disease) (HCC)    Esophageal stricture    s/p repeated dilations, Dr. Lavaughn [place]   GERD (gastroesophageal reflux disease)    Hearing loss bilateral   has hearing aids   Heart murmur    With no gross abnormalities on echocardiogram.   Hiatal hernia    History of blood transfusion    many years ago    Hyperlipidemia    Hypothyroidism    large b cell lymphoma 04/2019   Osteoarthritis    Osteoporosis    Paroxysmal atrial fibrillation (HCC)    1 confirm documented episode of A. fib; not on anticoagulation.   Pulmonary nodules    Renal lesion    1cm left kidney    ALLERGIES:  is allergic to other and codeine.  MEDICATIONS:  Current Outpatient Medications  Medication Sig Dispense Refill   albuterol  (VENTOLIN  HFA) 108 (90 Base) MCG/ACT inhaler Inhale 2 puffs into the lungs See admin instructions. Inhale 1-2  puffs by mouth two to three times a day.     allopurinol  (ZYLOPRIM ) 300 MG tablet Take 300 mg by mouth daily.     ALPRAZolam  (XANAX ) 0.25 MG tablet Take 0.25 mg by mouth daily as needed for anxiety.     amLODipine  (NORVASC ) 5 MG tablet Take 5 mg by mouth daily.     aspirin  EC 81 MG tablet Take 81 mg by mouth at bedtime.     atorvastatin  (LIPITOR) 20 MG tablet Take 20 mg by mouth at bedtime.  11   Cholecalciferol  (VITAMIN D3) 2000 UNITS TABS Take 2,000 Units by mouth at bedtime.      gabapentin  (NEURONTIN )  100 MG capsule TAKE 1 CAP EVERY MORNING, 1 CAP IN THE AFTERNOON, AND 2 CAPS EVERY DAY AT BEDTIME (Patient taking differently: Take 100-200 mg by mouth See admin instructions. Take 100mg  by mouth every morning, 100mg  by mouth in the afternoon and 200mg  by mouth daily at bedtime.) 360 capsule 2   Dextromethorphan -guaiFENesin  (FT TUSSIN DM ADULT) 20-200 MG/20ML LIQD Take 5 mLs by mouth every 4 (four) hours as needed. 236 mL 0   ipratropium-albuterol  (DUONEB) 0.5-2.5 (3) MG/3ML SOLN Take 3 mLs by nebulization every 6 (six) hours as needed. 360 mL 0   levothyroxine  (SYNTHROID ) 88 MCG tablet Take 88 mcg by mouth daily before breakfast.     metoprolol  tartrate (LOPRESSOR ) 25 MG tablet Take 25 mg by mouth 2 (two) times daily.     mirtazapine  (REMERON ) 7.5 MG tablet Take 7.5 mg by mouth at bedtime.     mometasone -formoterol  (DULERA ) 200-5 MCG/ACT AERO Inhale 2 puffs into the lungs 2 (two) times daily. (Patient taking differently: Inhale 2 puffs into the lungs 2 (two) times daily as needed for wheezing or shortness of breath.) 1 each 2   mupirocin ointment (BACTROBAN) 2 % Apply 1 Application topically 2 (two) times daily as needed (Dermatitis).     neomycin-polymyxin b-dexamethasone  (MAXITROL) 3.5-10000-0.1 SUSP Place 1 drop into both eyes See admin instructions. Place one drop into both eyes three to four times daily for dry eye.     pantoprazole  (PROTONIX ) 40 MG tablet Take 40 mg by mouth 2 (two) times daily.   11   Potassium 99 MG TABS Take 99 mg by mouth at bedtime.     traMADol  (ULTRAM ) 50 MG tablet Take 50 mg by mouth See admin instructions. 2-3x     Vibegron (GEMTESA) 75 MG TABS Take 75 mg by mouth daily.     vitamin B-12 (CYANOCOBALAMIN ) 1000 MCG tablet Take 1,000 mcg by mouth daily.     No current facility-administered medications for this visit.    SURGICAL HISTORY:  Past Surgical History:  Procedure Laterality Date   ABDOMINAL HYSTERECTOMY     BREAST BIOPSY Left 10/19/2015   BREAST  ENHANCEMENT SURGERY  01/10/2004   CATARACT EXTRACTION W/ INTRAOCULAR LENS  IMPLANT, BILATERAL  '09   ESOPHAGEAL DILATION     IR IMAGING GUIDED PORT INSERTION  11/05/2019   IR REMOVAL TUN ACCESS W/ PORT W/O FL MOD SED  05/03/2020   KNEE ARTHROSCOPY  02/22/2011   Procedure: ARTHROSCOPY KNEE;  Surgeon: Aurther Blue, MD;  Location: [facility];  Service: Orthopedics;  Laterality: Right;  WITH DEBRIDEMENT    LAPAROSCOPIC SMALL BOWEL RESECTION N/A 06/02/2016   Procedure: DIAGNOSTIC LAPAROSCOPY SMALL BOWEL RESECTION;  Surgeon: Oralee Billow, MD;  Location: [facility];  Service: General;  Laterality: N/A;   TRANSTHORACIC ECHOCARDIOGRAM  03/24/2016   a) (In setting  of COPD exacerbation-pneumonia) EF 60 to 65%. ~Appear to be in A. Fib.  Unable to assess diastolic function.  Normal wall motion.  Mild LA dilation.  Trivial MR.  Elevated PAP estimated 53 mmHg, RAP 15 mm.; b) 07/29/2018: Vigorous LV function, EF> 65%.  Normal wall motion.  GR 1 DD with elevated LAP.  Normal RV size and function.  Normal valves.  Trivial MR.   TRANSTHORACIC ECHOCARDIOGRAM  10/2019   EF 60 to 65%.  Normal LV size and function.  No R WMA.  GR 1 DD.  Normal PA/RVP with normal RV.  Normal RAP.  Grossly normal aortic and mitral valves.  No significant change from prior study.   Zio Patch Monitor-back-to-back 14 days (28-day total)  12/2020   Predominantly SR: HR range 47-102 bpm, AVG 63 bpm.  Rare PACs and PVCs.  9 atrial runs: Fastest 8 beats 139 bpm, longest 9 beats 90 bpm.  No sustained arrhythmias either fast or slow.  No pauses.  Symptoms noted with sinus rhythm and occasionally with sinus rhythm associated with PACs or PVCs.    REVIEW OF SYSTEMS:   Review of Systems  Constitutional: Positive for decreased appetite/ Negative for chills, fatigue, fever. HENT: Negative for mouth sores, nosebleeds, sore throat and trouble swallowing.   Eyes: Negative for eye problems and icterus.  Respiratory: Improved shortness of  breath from COVID in November 2024 and improving cough. Negative for hemoptysis and wheezing.   Cardiovascular: Negative for chest pain and leg swelling.  Gastrointestinal: Negative for abdominal pain, constipation, diarrhea, nausea and vomiting.  Genitourinary: Negative for bladder incontinence, difficulty urinating, dysuria, frequency and hematuria.   Musculoskeletal: Negative for back pain, gait problem, neck pain and neck stiffness.  Skin: Negative for itching and rash.  Neurological: Negative for dizziness, extremity weakness, gait problem, headaches, light-headedness and seizures.  Hematological: Negative for adenopathy. Does not bruise/bleed easily.  Psychiatric/Behavioral: Negative for confusion, depression and sleep disturbance. The patient is not nervous/anxious.     PHYSICAL EXAMINATION:  Blood pressure 116/61, pulse 69, temperature 97.9 F (36.6 C), temperature source Temporal, resp. rate 13, weight 125 lb 14.4 oz (57.1 kg), SpO2 90%.  ECOG PERFORMANCE STATUS: 1  Physical Exam  Constitutional: Oriented to person, place, and time and well-developed, well-nourished, and in no distress.  HENT:  Head: Normocephalic and atraumatic.  Mouth/Throat: Oropharynx is clear and moist. No oropharyngeal exudate.  Eyes: Conjunctivae are normal. Right eye exhibits no discharge. Left eye exhibits no discharge. No scleral icterus.  Neck: Normal range of motion. Neck supple.  Cardiovascular: Normal rate, regular rhythm, normal heart sounds and intact distal pulses.   Pulmonary/Chest: Effort normal. She had some rhonchi in the right lung which improved with coughing. No respiratory distress. No wheezes.  Abdominal: Soft. Bowel sounds are normal. Exhibits no distension and no mass. There is no tenderness.  Musculoskeletal: Normal range of motion. Exhibits no edema.  Lymphadenopathy:    No cervical adenopathy.  Neurological: Alert and oriented to person, place, and time. Exhibits normal muscle  tone. Gait normal. Coordination normal.  Skin: Skin is warm and dry. No rash noted. Not diaphoretic. No erythema. No pallor.  Psychiatric: Mood, memory and judgment normal.  Vitals reviewed.  LABORATORY DATA: Lab Results  Component Value Date   WBC 8.2 06/12/2023   HGB 12.1 06/12/2023   HCT 37.2 06/12/2023   MCV 97.6 06/12/2023   PLT 269 06/12/2023      Chemistry      Component Value Date/Time  NA 137 06/12/2023 0914   K 4.2 06/12/2023 0914   CL 99 06/12/2023 0914   CO2 35 (H) 06/12/2023 0914   BUN 7 (L) 06/12/2023 0914   CREATININE 0.75 06/12/2023 0914      Component Value Date/Time   CALCIUM  9.2 06/12/2023 0914   ALKPHOS 109 06/12/2023 0914   AST 24 06/12/2023 0914   ALT 18 06/12/2023 0914   BILITOT 1.0 06/12/2023 0914       RADIOGRAPHIC STUDIES:  No results found.   ASSESSMENT/PLAN:  Amy [name] 88 y.o. female with medical history significant for Stage II DLBCL of the tonsil who presents for a follow up visit.   Her initial workup with labs, the records, discussion with the patient the findings most consistent with a stage II diffuse large B-cell lymphoma of the tonsil. She had involvement of the right tonsil as well as axillary lymph nodes which would constitute stage II disease. She did have some mild activity in other lymph nodes throughout the body, but nowhere near as active as the ones within her head neck. Lymphomas involvement of these lymph nodes is not likely and therefore Dr. Rosaline Coma proceeded with treatment as if the patient were a stage II.   Previously discussed the diagnosis of diffuse large B-cell lymphoma and the treatment options moving forward.  The regimen most recommended for this patient would be R mini CHOP with consideration for 3 cycles followed by radiation to the local lymph nodes.  This is consistent with the treatment course to be recommended with R-CHOP chemotherapy for stage II disease.  We are still currently waiting for the results of  the double hit panel, however this would not likely change management as the patient would not be able to tolerate full strength chemotherapy with something like R-EPOCH.  Dr. Rosaline Coma also previously discussed the risks and benefits of this R-CHOP chemotherapy including constipation, neurotoxicity, cardiac toxicity, drop in blood counts, fatigue, nausea, vomiting, and alopecia.  The patient voiced her understanding of these complications and wished to proceed forward with treatment.   The regimen of R- miniCHOP consists of rituximab  325 mg per metered squared, cyclophosphamide  400 mg per metered squared IV, doxorubicin  25 mg per metered squared IV, vincristine  1 mg IV, and prednisone  40 mg per metered squared p.o. on days 1 through 5.  This will be pursued for 3 cycles of chemotherapy with the addition of radiation therapy.  Based on NCCN recommendations, member institutes do practice this regimen (short course chemo + RT), though data is not robust.  # Diffuse Large B Cell Lymphoma, Stage II --complete response noted on PET CT scan on 01/08/2020. No evidence of recurrent on CT scans from 01/05/2022.  --completed 3 cycles of R-mini-CHOP followed by localized radiation.  -- TTE shows EF 60-65% with mild diastolic dysfunction --patient has established care with Dr. Lurena Sally and underwent radiation therapy which ended on 02/27/2020.  -- Labs today show white blood cell count 8.2, hemoglobin 12.1, MCV 97.6, platelets 269. Creatinine and LFTs are both within normal limits. --repeat CT scan can be performed as clinically indicated moving forward --RTC in 6 months time for repeat clinic visit.  I discussed with the patient should she ever develop any new concerning symptoms we would always be happy to see her sooner   No orders of the defined types were placed in this encounter.    All questions were answered. The patient knows to call the clinic with any problems, questions or concerns.  A total of more  than 25 minutes were spent on this encounter and over half of that time was spent on counseling and coordination of care as outlined above.   Rogerio Clay, MD Department of Hematology/Oncology [facility] at [facility] Phone: [phone] Pager: [phone] Email: [email or web address]

## 2023-07-14 ENCOUNTER — Encounter (HOSPITAL_BASED_OUTPATIENT_CLINIC_OR_DEPARTMENT_OTHER): Payer: Self-pay | Admitting: Emergency Medicine

## 2023-07-14 ENCOUNTER — Emergency Department (HOSPITAL_BASED_OUTPATIENT_CLINIC_OR_DEPARTMENT_OTHER)

## 2023-07-14 ENCOUNTER — Emergency Department (HOSPITAL_BASED_OUTPATIENT_CLINIC_OR_DEPARTMENT_OTHER)
Admission: EM | Admit: 2023-07-14 | Discharge: 2023-07-14 | Disposition: A | Discharge status: Home / Self Care | Attending: Emergency Medicine | Admitting: Emergency Medicine

## 2023-07-14 ENCOUNTER — Other Ambulatory Visit: Payer: Self-pay

## 2023-07-14 DIAGNOSIS — Z7951 Long term (current) use of inhaled steroids: Secondary | ICD-10-CM | POA: Diagnosis not present

## 2023-07-14 DIAGNOSIS — K5900 Constipation, unspecified: Secondary | ICD-10-CM | POA: Insufficient documentation

## 2023-07-14 DIAGNOSIS — E039 Hypothyroidism, unspecified: Secondary | ICD-10-CM | POA: Insufficient documentation

## 2023-07-14 DIAGNOSIS — Z87891 Personal history of nicotine dependence: Secondary | ICD-10-CM | POA: Diagnosis not present

## 2023-07-14 DIAGNOSIS — Z7982 Long term (current) use of aspirin: Secondary | ICD-10-CM | POA: Insufficient documentation

## 2023-07-14 DIAGNOSIS — Z7984 Long term (current) use of oral hypoglycemic drugs: Secondary | ICD-10-CM | POA: Diagnosis not present

## 2023-07-14 DIAGNOSIS — J449 Chronic obstructive pulmonary disease, unspecified: Secondary | ICD-10-CM | POA: Insufficient documentation

## 2023-07-14 LAB — CBC WITH DIFFERENTIAL/PLATELET
Abs Immature Granulocytes: 0.04 K/uL (ref 0.00–0.07)
Basophils Absolute: 0 K/uL (ref 0.0–0.1)
Basophils Relative: 0 %
Eosinophils Absolute: 0 K/uL (ref 0.0–0.5)
Eosinophils Relative: 0 %
HCT: 35.7 % — ABNORMAL LOW (ref 36.0–46.0)
Hemoglobin: 12 g/dL (ref 12.0–15.0)
Immature Granulocytes: 0 %
Lymphocytes Relative: 11 %
Lymphs Abs: 1.8 K/uL (ref 0.7–4.0)
MCH: 32.9 pg (ref 26.0–34.0)
MCHC: 33.6 g/dL (ref 30.0–36.0)
MCV: 97.8 fL (ref 80.0–100.0)
Monocytes Absolute: 1.1 K/uL — ABNORMAL HIGH (ref 0.1–1.0)
Monocytes Relative: 7 %
Neutro Abs: 13.7 K/uL — ABNORMAL HIGH (ref 1.7–7.7)
Neutrophils Relative %: 82 %
Platelets: 298 K/uL (ref 150–400)
RBC: 3.65 MIL/uL — ABNORMAL LOW (ref 3.87–5.11)
RDW: 14.9 % (ref 11.5–15.5)
WBC: 16.7 K/uL — ABNORMAL HIGH (ref 4.0–10.5)
nRBC: 0 % (ref 0.0–0.2)

## 2023-07-14 LAB — COMPREHENSIVE METABOLIC PANEL WITH GFR
ALT: 11 U/L (ref 0–44)
AST: 27 U/L (ref 15–41)
Albumin: 4.2 g/dL (ref 3.5–5.0)
Alkaline Phosphatase: 144 U/L — ABNORMAL HIGH (ref 38–126)
Anion gap: 11 (ref 5–15)
BUN: 10 mg/dL (ref 8–23)
CO2: 29 mmol/L (ref 22–32)
Calcium: 9.6 mg/dL (ref 8.9–10.3)
Chloride: 93 mmol/L — ABNORMAL LOW (ref 98–111)
Creatinine, Ser: 0.95 mg/dL (ref 0.44–1.00)
GFR, Estimated: 58 mL/min — ABNORMAL LOW (ref >60)
Glucose, Bld: 134 mg/dL — ABNORMAL HIGH (ref 70–99)
Potassium: 3.5 mmol/L (ref 3.5–5.1)
Sodium: 133 mmol/L — ABNORMAL LOW (ref 135–145)
Total Bilirubin: 1 mg/dL (ref 0.0–1.2)
Total Protein: 7.1 g/dL (ref 6.5–8.1)

## 2023-07-14 LAB — LIPASE, BLOOD: Lipase: <10 U/L — ABNORMAL LOW (ref 11–51)

## 2023-07-14 LAB — LACTIC ACID, PLASMA: Lactic Acid, Venous: 1.4 mmol/L (ref 0.5–1.9)

## 2023-07-14 MED ORDER — FENTANYL CITRATE PF 50 MCG/ML IJ SOSY
50.0000 ug | PREFILLED_SYRINGE | Freq: Once | INTRAMUSCULAR | Status: AC
  Administered 2023-07-14: 50 ug via INTRAVENOUS
  Filled 2023-07-14: qty 1

## 2023-07-14 MED ORDER — SMOG ENEMA
960.0000 mL | Freq: Once | RECTAL | Status: DC
  Filled 2023-07-14: qty 960

## 2023-07-14 MED ORDER — ONDANSETRON HCL 4 MG/2ML IJ SOLN
4.0000 mg | Freq: Once | INTRAMUSCULAR | Status: AC
  Administered 2023-07-14: 4 mg via INTRAVENOUS
  Filled 2023-07-14: qty 2

## 2023-07-14 MED ORDER — ONDANSETRON HCL 4 MG/2ML IJ SOLN: 4.0000 mg | Freq: Once | INTRAMUSCULAR | Status: DC

## 2023-07-14 MED ORDER — IOHEXOL 300 MG/ML  SOLN
80.0000 mL | Freq: Once | INTRAMUSCULAR | Status: AC | PRN
  Administered 2023-07-14: 80 mL via INTRAVENOUS

## 2023-07-14 NOTE — Discharge Instructions (Signed)
 As we discussed, take Miralax  daily for the next 3 days. This medication can readily be found over-the-counter. Call you doctor on Monday to schedule a recheck appointment this week for recheck.   Return to the ED with any or concerning symptoms.

## 2023-07-14 NOTE — ED Notes (Signed)
 PT experienced a brief run of emesis at the bedside, RN informed and chuck placed to soak up before cleaning.

## 2023-07-14 NOTE — ED Notes (Signed)
 Pt d/c instructions, medications, and follow-up care reviewed with pt. Pt verbalized understanding and had no further questions at time of d/c. Pt CA&Ox4, ambulatory, and in NAD at time of d/c

## 2023-07-14 NOTE — ED Triage Notes (Signed)
 Lbm on Monday, normal pattern everyday. She took mag citrate today. Dulcolax tabs for 2 nights )2 at at time).

## 2023-07-14 NOTE — ED Provider Notes (Signed)
 [place] [facility]ENC[facility] DEPARTMENT AT [facility] Provider Note   CSN: [id number] Arrival date & time: 07/14/23  1235     Patient presents with: No chief complaint on file.   Amy [name] is a 88 y.o. female.   Patient to ED with complaint of constipation. Last bowel movement was 6 days ago and is reported to have been soft and easy to pass. She states she is passing gas but infrequently the past 6 days. No fever. She reports she tried Mag Citrate, which has worked in the past for constipation, but no relief currently. She had nausea with vomiting after Mag Citrate use. She denies fever, chest pain or SoB. She reports a remote history of partial ilectomy but cannot remember why the surgery was necessary.   The history is provided by the patient. No language interpreter was used.       Prior to Admission medications   Medication Sig Start Date End Date Taking? Authorizing Provider  albuterol  (VENTOLIN  HFA) 108 (90 Base) MCG/ACT inhaler Inhale 2 puffs into the lungs See admin instructions. Inhale 1-2 puffs by mouth two to three times a day. 11/11/20   [provider]  allopurinol  (ZYLOPRIM ) 300 MG tablet Take 300 mg by mouth daily.    [provider]  ALPRAZolam  (XANAX ) 0.25 MG tablet Take 0.25 mg by mouth daily as needed for anxiety.    [provider]  amLODipine  (NORVASC ) 5 MG tablet Take 5 mg by mouth daily. 09/12/19   [provider]  aspirin  EC 81 MG tablet Take 81 mg by mouth at bedtime.    [provider]  atorvastatin  (LIPITOR) 20 MG tablet Take 20 mg by mouth at bedtime.    [provider]  Cholecalciferol  (VITAMIN D3) 2000 UNITS TABS Take 2,000 Units by mouth at bedtime.     [provider]  gabapentin  (NEURONTIN ) 100 MG capsule TAKE 1 CAP EVERY MORNING, 1 CAP IN THE AFTERNOON, AND 2 CAPS EVERY DAY AT BEDTIME Patient taking differently: Take 100-200 mg by mouth See admin instructions. Take 100mg  by mouth every  morning, 100mg  by mouth in the afternoon and 200mg  by mouth daily at bedtime. 04/15/18   Sater, Charlie LABOR, MD  Dextromethorphan -guaiFENesin  (FT TUSSIN DM ADULT) 20-200 MG/20ML LIQD Take 5 mLs by mouth every 4 (four) hours as needed. 02/17/23   Jillian Buttery, MD  ipratropium-albuterol  (DUONEB) 0.5-2.5 (3) MG/3ML SOLN Take 3 mLs by nebulization every 6 (six) hours as needed. 02/17/23   Jillian Buttery, MD  levothyroxine  (SYNTHROID ) 88 MCG tablet Take 88 mcg by mouth daily before breakfast. 03/01/21   [provider]  metoprolol  tartrate (LOPRESSOR ) 25 MG tablet Take 25 mg by mouth 2 (two) times daily.    [provider]  mirtazapine  (REMERON ) 7.5 MG tablet Take 7.5 mg by mouth at bedtime. 06/23/21   [provider]  mometasone -formoterol  (DULERA ) 200-5 MCG/ACT AERO Inhale 2 puffs into the lungs 2 (two) times daily. Patient taking differently: Inhale 2 puffs into the lungs 2 (two) times daily as needed for wheezing or shortness of breath. 04/25/22   [place], Camellia PARAS, DO  mupirocin ointment (BACTROBAN) 2 % Apply 1 Application topically 2 (two) times daily as needed (Dermatitis). 02/01/22   [provider]  neomycin-polymyxin b-dexamethasone  (MAXITROL) 3.5-10000-0.1 SUSP Place 1 drop into both eyes See admin instructions. Place one drop into both eyes three to four times daily for dry eye. 04/20/22   [provider]  pantoprazole  (PROTONIX ) 40 MG tablet  Take 40 mg by mouth 2 (two) times daily.     [provider]  Potassium 99 MG TABS Take 99 mg by mouth at bedtime.    [provider]  traMADol  (ULTRAM ) 50 MG tablet Take 50 mg by mouth See admin instructions. 2-3x    [provider]  Vibegron (GEMTESA) 75 MG TABS Take 75 mg by mouth daily.    [provider]  vitamin B-12 (CYANOCOBALAMIN ) 1000 MCG tablet Take 1,000 mcg by mouth daily.    [provider]    Allergies: Other and Codeine    Review of Systems  Updated Vital  Signs BP 121/69   Pulse 83   Temp 98.3 F (36.8 C)   Resp 13   Wt 62.1 kg   SpO2 96%   BMI 22.80 kg/m   Physical Exam Vitals and nursing note reviewed.  Constitutional:      Appearance: She is well-developed.     Comments: Uncomfortable in appearance.   HENT:     Head: Normocephalic.  Cardiovascular:     Rate and Rhythm: Normal rate and regular rhythm.     Heart sounds: No murmur heard. Pulmonary:     Effort: Pulmonary effort is normal.  Abdominal:     General: Bowel sounds are absent. There is distension.     Tenderness: There is generalized abdominal tenderness. There is no guarding or rebound.  Musculoskeletal:        General: Normal range of motion.     Cervical back: Normal range of motion and neck supple.  Skin:    General: Skin is warm and dry.  Neurological:     General: No focal deficit present.     Mental Status: She is alert and oriented to person, place, and time.     (all labs ordered are listed, but only abnormal results are displayed) Labs Reviewed  CBC WITH DIFFERENTIAL/PLATELET - Abnormal; Notable for the following components:      Result Value   WBC 16.7 (*)    RBC 3.65 (*)    HCT 35.7 (*)    Neutro Abs 13.7 (*)    Monocytes Absolute 1.1 (*)    All other components within normal limits  COMPREHENSIVE METABOLIC PANEL WITH GFR - Abnormal; Notable for the following components:   Sodium 133 (*)    Chloride 93 (*)    Glucose, Bld 134 (*)    Alkaline Phosphatase 144 (*)    GFR, Estimated 58 (*)    All other components within normal limits  LIPASE, BLOOD - Abnormal; Notable for the following components:   Lipase <10 (*)    All other components within normal limits  LACTIC ACID, PLASMA  URINALYSIS, ROUTINE W REFLEX MICROSCOPIC   Results for orders placed or performed during the hospital encounter of 07/14/23  CBC with Differential   Collection Time: 07/14/23  2:52 PM  Result Value Ref Range   WBC 16.7 (H) 4.0 - 10.5 K/uL   RBC 3.65 (L) 3.87 -  5.11 MIL/uL   Hemoglobin 12.0 12.0 - 15.0 g/dL   HCT 64.2 (L) 63.9 - 53.9 %   MCV 97.8 80.0 - 100.0 fL   MCH 32.9 26.0 - 34.0 pg   MCHC 33.6 30.0 - 36.0 g/dL   RDW 85.0 88.4 - 84.4 %   Platelets 298 150 - 400 K/uL   nRBC 0.0 0.0 - 0.2 %   Neutrophils Relative % 82 %   Neutro Abs 13.7 (H) 1.7 - 7.7  K/uL   Lymphocytes Relative 11 %   Lymphs Abs 1.8 0.7 - 4.0 K/uL   Monocytes Relative 7 %   Monocytes Absolute 1.1 (H) 0.1 - 1.0 K/uL   Eosinophils Relative 0 %   Eosinophils Absolute 0.0 0.0 - 0.5 K/uL   Basophils Relative 0 %   Basophils Absolute 0.0 0.0 - 0.1 K/uL   Immature Granulocytes 0 %   Abs Immature Granulocytes 0.04 0.00 - 0.07 K/uL  Comprehensive metabolic panel   Collection Time: 07/14/23  2:52 PM  Result Value Ref Range   Sodium 133 (L) 135 - 145 mmol/L   Potassium 3.5 3.5 - 5.1 mmol/L   Chloride 93 (L) 98 - 111 mmol/L   CO2 29 22 - 32 mmol/L   Glucose, Bld 134 (H) 70 - 99 mg/dL   BUN 10 8 - 23 mg/dL   Creatinine, Ser 9.04 0.44 - 1.00 mg/dL   Calcium  9.6 8.9 - 10.3 mg/dL   Total Protein 7.1 6.5 - 8.1 g/dL   Albumin 4.2 3.5 - 5.0 g/dL   AST 27 15 - 41 U/L   ALT 11 0 - 44 U/L   Alkaline Phosphatase 144 (H) 38 - 126 U/L   Total Bilirubin 1.0 0.0 - 1.2 mg/dL   GFR, Estimated 58 (L) >60 mL/min   Anion gap 11 5 - 15  Lactic acid, plasma   Collection Time: 07/14/23  2:52 PM  Result Value Ref Range   Lactic Acid, Venous 1.4 0.5 - 1.9 mmol/L  Lipase, blood   Collection Time: 07/14/23  2:52 PM  Result Value Ref Range   Lipase <10 (L) 11 - 51 U/L    EKG: None  Radiology: CT ABDOMEN PELVIS W CONTRAST Result Date: 07/14/2023 CLINICAL DATA:  Concern for bowel obstruction EXAM: CT ABDOMEN AND PELVIS WITH CONTRAST TECHNIQUE: Multidetector CT imaging of the abdomen and pelvis was performed using the standard protocol following bolus administration of intravenous contrast. RADIATION DOSE REDUCTION: This exam was performed according to the departmental dose-optimization  program which includes automated exposure control, adjustment of the mA and/or kV according to patient size and/or use of iterative reconstruction technique. CONTRAST:  80mL OMNIPAQUE  IOHEXOL  300 MG/ML  SOLN COMPARISON:  CT 01/05/2022 FINDINGS: Lower chest: Lung bases are clear. Bilateral subglandular breast prosthetics. Hepatobiliary: Postcholecystectomy. Mild intrahepatic and extrahepatic biliary duct dilatation similar comparison CT. Pancreas: Pancreas is normal. No ductal dilatation. No pancreatic inflammation. Spleen: Normal spleen Adrenals/urinary tract: Adrenal glands and kidneys are normal. The ureters and bladder normal. Stomach/Bowel: There is a large hiatal hernia. The hernia sac is fluid-filled. Fluid in the distal esophagus. Stomach is predominantly decompressed. Duodenum is normal caliber. The small bowel is normal caliber. There is a small bowel anastomosis in the mid abdomen (image 42/2) without evidence obstruction. Small bowel is nondistended to the terminal ileum. The cecum is fluid-filled. The transverse colon is fluid-filled and dilated. There is stool in the descending colon. There are diverticula of the sigmoid colon. Large volume stool in the rectum. The colon is relatively featureless with minimal haustral markings. Vascular/Lymphatic: Abdominal aorta is normal caliber with atherosclerotic calcification. There is no retroperitoneal or periportal lymphadenopathy. No pelvic lymphadenopathy. Reproductive: Post hysterectomy.  Adnexa unremarkable Other: No intraperitoneal free fluid.  No intraperitoneal free air. Musculoskeletal: No aggressive osseous lesion. IMPRESSION: 1. The entire colon is fluid-filled on the RIGHT and stool-filled on the LEFT with large volume stool in the rectum. Findings are not suggestive of bowel obstruction but rather colonic ileus. 2. The  small bowel is not dilated. 3. Fluid-filled hiatal hernia is noted. Electronically Signed   By: Jackquline Boxer M.D.   On:  07/14/2023 15:57     Procedures   Medications Ordered in the ED  ondansetron  (ZOFRAN ) injection 4 mg (has no administration in time range)  fentaNYL  (SUBLIMAZE ) injection 50 mcg (50 mcg Intravenous Given 07/14/23 1453)  ondansetron  (ZOFRAN ) injection 4 mg (4 mg Intravenous Given 07/14/23 1453)  iohexol  (OMNIPAQUE ) 300 MG/ML solution 80 mL (80 mLs Intravenous Contrast Given 07/14/23 1544)                                    Medical Decision Making This patient presents to the ED for concern of constipation, this involves an extensive number of treatment options, and is a complaint that carries with it a high risk of complications and morbidity.  The differential diagnosis includes bowel blockage, constipation, fecal impaction, ileus, ischemic bowel,    Co morbidities that complicate the patient evaluation  H/O lymphoma, isolated episode a-fib without chronic anticoagulation, GERD, esophageal stricture ([place]), hiatal hernia, COPD, hypothyroidism, GERD   Additional history obtained:  Additional history and/or information obtained from chart review, notable for admission for bowel obstruction 2018, choledocholithiasis in 2018   Lab Tests:  I Ordered, and personally interpreted labs.  The pertinent results include:   Lactic acid normal at 1.4 Cmet: Na 133, Cl 93, alk phos 144, normal renal function CBC: WBC 16.7, normal hgb, normal plts   Imaging Studies ordered:  I ordered imaging studies including CT abd/pel w/CM Per radiologist interpretation: IMPRESSION: 1. The entire colon is fluid-filled on the RIGHT and stool-filled on the LEFT with large volume stool in the rectum. Findings are not suggestive of bowel obstruction but rather colonic ileus. 2. The small bowel is not dilated. 3. Fluid-filled hiatal hernia is noted.    Cardiac Monitoring:  The patient was maintained on a cardiac monitor.  I personally viewed and interpreted the cardiac monitored which showed an  underlying rhythm of: n/a   Medicines ordered and prescription drug management:  I ordered medication including enema (soap suds)  for constipation Reevaluation of the patient after these medicines showed that the patient resolved I have reviewed the patients home medicines and have made adjustments as needed   Test Considered:  N/a   Critical Interventions:  N/2   Consultations Obtained:  I requested consultation with the n/a,  and discussed lab and imaging findings as well as pertinent plan - they recommend: n/a   Problem List / ED Course:  Here with feeling constipated x 1 week. No fever. Vomited x 1 after taking Mag Citrate.  History of bowel obstruction in the past, history of bowel surgery remotely.  CT showing ileus with large stool burden in descending colon and rectum. Digital rectal exam - significant semi-soft stool in the rectum, easily disimpacted Will introduce enema and continue efforts at relieving constipation.  Anticipate discharge home.   Enema administered with good bowel movements afterward. She is feeling much better and is felt appropriate for discharge home. Recommended Miralax    Reevaluation:  After the interventions noted above, I reevaluated the patient and found that they have :resolved   Social Determinants of Health:  Former smoker   Disposition:  After consideration of the diagnostic results and the patients response to treatment, I feel that the patient would benefit from discharge home.   Amount and/or Complexity  of Data Reviewed Labs: ordered. Radiology: ordered.  Risk Prescription drug management.        Final diagnoses:  Constipation, unspecified constipation type    ED Discharge Orders     None          Odell Margit RIGGERS 07/14/23 1930    Randol Simmonds, MD 07/15/23 [phone]

## 2023-12-03 ENCOUNTER — Other Ambulatory Visit

## 2023-12-03 ENCOUNTER — Ambulatory Visit

## 2023-12-19 ENCOUNTER — Other Ambulatory Visit: Payer: Self-pay | Admitting: Hematology and Oncology

## 2023-12-19 ENCOUNTER — Ambulatory Visit: Admitting: Hematology and Oncology

## 2023-12-19 ENCOUNTER — Inpatient Hospital Stay: Attending: Hematology and Oncology

## 2023-12-19 DIAGNOSIS — F419 Anxiety disorder, unspecified: Secondary | ICD-10-CM | POA: Diagnosis not present

## 2023-12-19 DIAGNOSIS — E039 Hypothyroidism, unspecified: Secondary | ICD-10-CM | POA: Insufficient documentation

## 2023-12-19 DIAGNOSIS — Z9049 Acquired absence of other specified parts of digestive tract: Secondary | ICD-10-CM | POA: Insufficient documentation

## 2023-12-19 DIAGNOSIS — Z79899 Other long term (current) drug therapy: Secondary | ICD-10-CM | POA: Diagnosis not present

## 2023-12-19 DIAGNOSIS — C8331 Diffuse large B-cell lymphoma, lymph nodes of head, face, and neck: Secondary | ICD-10-CM | POA: Diagnosis not present

## 2023-12-19 DIAGNOSIS — Z90710 Acquired absence of both cervix and uterus: Secondary | ICD-10-CM | POA: Insufficient documentation

## 2023-12-19 DIAGNOSIS — Z7951 Long term (current) use of inhaled steroids: Secondary | ICD-10-CM | POA: Diagnosis not present

## 2023-12-19 DIAGNOSIS — Z923 Personal history of irradiation: Secondary | ICD-10-CM | POA: Diagnosis not present

## 2023-12-19 DIAGNOSIS — E611 Iron deficiency: Secondary | ICD-10-CM | POA: Insufficient documentation

## 2023-12-19 DIAGNOSIS — Z7982 Long term (current) use of aspirin: Secondary | ICD-10-CM | POA: Diagnosis not present

## 2023-12-19 DIAGNOSIS — I480 Paroxysmal atrial fibrillation: Secondary | ICD-10-CM | POA: Diagnosis not present

## 2023-12-19 DIAGNOSIS — C83398 Diffuse large b-cell lymphoma of other extranodal and solid organ sites: Secondary | ICD-10-CM | POA: Diagnosis present

## 2023-12-19 DIAGNOSIS — M810 Age-related osteoporosis without current pathological fracture: Secondary | ICD-10-CM | POA: Diagnosis not present

## 2023-12-19 DIAGNOSIS — J449 Chronic obstructive pulmonary disease, unspecified: Secondary | ICD-10-CM | POA: Diagnosis not present

## 2023-12-19 DIAGNOSIS — E785 Hyperlipidemia, unspecified: Secondary | ICD-10-CM | POA: Insufficient documentation

## 2023-12-19 DIAGNOSIS — Z885 Allergy status to narcotic agent status: Secondary | ICD-10-CM | POA: Insufficient documentation

## 2023-12-19 LAB — CBC WITH DIFFERENTIAL (CANCER CENTER ONLY)
Abs Immature Granulocytes: 0.01 K/uL (ref 0.00–0.07)
Basophils Absolute: 0.1 K/uL (ref 0.0–0.1)
Basophils Relative: 1 %
Eosinophils Absolute: 0.3 K/uL (ref 0.0–0.5)
Eosinophils Relative: 4 %
HCT: 36.5 % (ref 36.0–46.0)
Hemoglobin: 12 g/dL (ref 12.0–15.0)
Immature Granulocytes: 0 %
Lymphocytes Relative: 47 %
Lymphs Abs: 3.4 K/uL (ref 0.7–4.0)
MCH: 31.5 pg (ref 26.0–34.0)
MCHC: 32.9 g/dL (ref 30.0–36.0)
MCV: 95.8 fL (ref 80.0–100.0)
Monocytes Absolute: 0.7 K/uL (ref 0.1–1.0)
Monocytes Relative: 9 %
Neutro Abs: 2.8 K/uL (ref 1.7–7.7)
Neutrophils Relative %: 39 %
Platelet Count: 256 K/uL (ref 150–400)
RBC: 3.81 MIL/uL — ABNORMAL LOW (ref 3.87–5.11)
RDW: 14.4 % (ref 11.5–15.5)
WBC Count: 7.2 K/uL (ref 4.0–10.5)
nRBC: 0 % (ref 0.0–0.2)

## 2023-12-19 LAB — RETIC PANEL
Immature Retic Fract: 12 % (ref 2.3–15.9)
RBC.: 3.79 MIL/uL — ABNORMAL LOW (ref 3.87–5.11)
Retic Count, Absolute: 50.4 K/uL (ref 19.0–186.0)
Retic Ct Pct: 1.3 % (ref 0.4–3.1)
Reticulocyte Hemoglobin: 35 pg (ref >27.9)

## 2023-12-19 LAB — CMP (CANCER CENTER ONLY)
ALT: 11 U/L (ref 0–44)
AST: 28 U/L (ref 15–41)
Albumin: 4.3 g/dL (ref 3.5–5.0)
Alkaline Phosphatase: 142 U/L — ABNORMAL HIGH (ref 38–126)
Anion gap: 7 (ref 5–15)
BUN: 8 mg/dL (ref 8–23)
CO2: 32 mmol/L (ref 22–32)
Calcium: 9.6 mg/dL (ref 8.9–10.3)
Chloride: 98 mmol/L (ref 98–111)
Creatinine: 0.7 mg/dL (ref 0.44–1.00)
GFR, Estimated: >60 mL/min (ref >60)
Glucose, Bld: 109 mg/dL — ABNORMAL HIGH (ref 70–99)
Potassium: 4.4 mmol/L (ref 3.5–5.1)
Sodium: 136 mmol/L (ref 135–145)
Total Bilirubin: 0.8 mg/dL (ref 0.0–1.2)
Total Protein: 7.2 g/dL (ref 6.5–8.1)

## 2023-12-19 LAB — IRON AND IRON BINDING CAPACITY (CC-WL,HP ONLY)
Iron: 69 ug/dL (ref 28–170)
Saturation Ratios: 19 % (ref 10.4–31.8)
TIBC: 375 ug/dL (ref 250–450)
UIBC: 306 ug/dL

## 2023-12-19 LAB — FERRITIN: Ferritin: 54 ng/mL (ref 11–307)

## 2023-12-19 LAB — LACTATE DEHYDROGENASE: LDH: 195 U/L (ref 105–235)

## 2023-12-19 NOTE — Progress Notes (Signed)
 [facility] OFFICE PROGRESS NOTE  Debrah Josette ORN., PA-C [address] [place] [address]  DIAGNOSIS:  # Diffuse Large B Cell Lymphoma, Stage II 1) 09/26/2019: CT scan of neck performed due to persistent tonsillar lesion after outpatient antibiotic course. CT scan showed a heterogeneous right tonsillar mass up to 3.2 cm and a small but suspicious right level 2A lymph node  2) 09/30/2019: patient underwent biopsy with ENT which revealed a DLBCL with FISH for MYC, BCL2 and BCL6 pending 3) 10/08/2019: establish care with Dr. Federico  4) 10/21/2019: PET scan showed hypermetabolic right tonsillar mass and adjacent right level 2 adenopathy, consistent with the given history of lymphoma. 5) 11/13/2019: Cycle 1 Day 1 of R-miniCHOP 6) 12/05/2019: Cycle 2 Day 1 of R-miniCHOP 7) 12/25/2019: Cycle 3 Day 1 of R-miniCHOP 8) 02/04/2020-02/27/2020: radiation therapy.  9) 12/27/2020: CT Chest/Ab/Pelvis/Neck showed no evidence of disease recurrence/progression.  10) 01/07/2022: CT Chest/Ab/Pelvis/Neck showed no evidence of disease recurrence/progression.   CURRENT THERAPY: Surveillance   INTERVAL HISTORY: Amy [name] 88 y.o. female returns to the clinic today for a routine follow-up visit.  The patient has a history of diffuse large B-cell lymphoma. She is currently on observation.  She was last seen on 06/12/2023.  On exam today Mrs. Szymanowski reports she has been well overall in the interim since our last visit.  She reports her energy levels have been good and her appetite has been strong.  She has had no issues with viral illnesses such as runny nose, sore throat Amy [name] has had a dry cough.  She has had no fevers, chills, sweats.  She reports her weight has been steady and she has had no major changes in her health.  She checks periodically for bumps or lumps and has not recently noticed any.  She notes that her iron levels were checked recently and found to be low.  She reports that she does  not eat much in the way of red meat but does enjoy her green leafy vegetables.  She has had no overt signs of bleeding, bruising, or dark stools.  She notes nothing else out of the ordinary.  A full 10 point ROS is otherwise negative.  MEDICAL HISTORY: Past Medical History:  Diagnosis Date   Anxiety    Arthritis    COPD (chronic obstructive pulmonary disease) (HCC)    Esophageal stricture    s/p repeated dilations, Dr. Rosalie   GERD (gastroesophageal reflux disease)    Hearing loss bilateral   has hearing aids   Heart murmur    With no gross abnormalities on echocardiogram.   Hiatal hernia    History of blood transfusion    many years ago    Hyperlipidemia    Hypothyroidism    large b cell lymphoma 04/2019   Osteoarthritis    Osteoporosis    Paroxysmal atrial fibrillation (HCC)    1 confirm documented episode of A. fib; not on anticoagulation.   Pulmonary nodules    Renal lesion    1cm left kidney    ALLERGIES:  is allergic to other and codeine.  MEDICATIONS:  Current Outpatient Medications  Medication Sig Dispense Refill   albuterol  (VENTOLIN  HFA) 108 (90 Base) MCG/ACT inhaler Inhale 2 puffs into the lungs See admin instructions. Inhale 1-2 puffs by mouth two to three times a day.     allopurinol  (ZYLOPRIM ) 300 MG tablet Take 300 mg by mouth daily.     ALPRAZolam  (XANAX ) 0.25 MG tablet Take  0.25 mg by mouth daily as needed for anxiety.     amLODipine  (NORVASC ) 5 MG tablet Take 5 mg by mouth daily.     aspirin  EC 81 MG tablet Take 81 mg by mouth at bedtime.     atorvastatin  (LIPITOR) 20 MG tablet Take 20 mg by mouth at bedtime.  11   Cholecalciferol  (VITAMIN D3) 2000 UNITS TABS Take 2,000 Units by mouth at bedtime.      gabapentin  (NEURONTIN ) 100 MG capsule TAKE 1 CAP EVERY MORNING, 1 CAP IN THE AFTERNOON, AND 2 CAPS EVERY DAY AT BEDTIME (Patient taking differently: Take 100-200 mg by mouth See admin instructions. Take 100mg  by mouth every morning, 100mg  by mouth in the  afternoon and 200mg  by mouth daily at bedtime.) 360 capsule 2   Dextromethorphan -guaiFENesin  (FT TUSSIN DM ADULT) 20-200 MG/20ML LIQD Take 5 mLs by mouth every 4 (four) hours as needed. 236 mL 0   ipratropium-albuterol  (DUONEB) 0.5-2.5 (3) MG/3ML SOLN Take 3 mLs by nebulization every 6 (six) hours as needed. 360 mL 0   levothyroxine  (SYNTHROID ) 88 MCG tablet Take 88 mcg by mouth daily before breakfast.     metoprolol  tartrate (LOPRESSOR ) 25 MG tablet Take 25 mg by mouth 2 (two) times daily.     mirtazapine  (REMERON ) 7.5 MG tablet Take 7.5 mg by mouth at bedtime.     mometasone -formoterol  (DULERA ) 200-5 MCG/ACT AERO Inhale 2 puffs into the lungs 2 (two) times daily. (Patient taking differently: Inhale 2 puffs into the lungs 2 (two) times daily as needed for wheezing or shortness of breath.) 1 each 2   mupirocin ointment (BACTROBAN) 2 % Apply 1 Application topically 2 (two) times daily as needed (Dermatitis).     neomycin-polymyxin b-dexamethasone  (MAXITROL) 3.5-10000-0.1 SUSP Place 1 drop into both eyes See admin instructions. Place one drop into both eyes three to four times daily for dry eye.     pantoprazole  (PROTONIX ) 40 MG tablet Take 40 mg by mouth 2 (two) times daily.   11   Potassium 99 MG TABS Take 99 mg by mouth at bedtime.     traMADol  (ULTRAM ) 50 MG tablet Take 50 mg by mouth See admin instructions. 2-3x     Vibegron (GEMTESA) 75 MG TABS Take 75 mg by mouth daily.     vitamin B-12 (CYANOCOBALAMIN ) 1000 MCG tablet Take 1,000 mcg by mouth daily.     No current facility-administered medications for this visit.    SURGICAL HISTORY:  Past Surgical History:  Procedure Laterality Date   ABDOMINAL HYSTERECTOMY     BREAST BIOPSY Left 10/19/2015   BREAST ENHANCEMENT SURGERY  01/10/2004   CATARACT EXTRACTION W/ INTRAOCULAR LENS  IMPLANT, BILATERAL  '09   ESOPHAGEAL DILATION     IR IMAGING GUIDED PORT INSERTION  11/05/2019   IR REMOVAL TUN ACCESS W/ PORT W/O FL MOD SED  05/03/2020   KNEE  ARTHROSCOPY  02/22/2011   Procedure: ARTHROSCOPY KNEE;  Surgeon: Dempsey LULLA Moan, MD;  Location: [facility];  Service: Orthopedics;  Laterality: Right;  WITH DEBRIDEMENT    LAPAROSCOPIC SMALL BOWEL RESECTION N/A 06/02/2016   Procedure: DIAGNOSTIC LAPAROSCOPY SMALL BOWEL RESECTION;  Surgeon: Eletha Boas, MD;  Location: [facility];  Service: General;  Laterality: N/A;   TRANSTHORACIC ECHOCARDIOGRAM  03/24/2016   a) (In setting of COPD exacerbation-pneumonia) EF 60 to 65%. ~Appear to be in A. Fib.  Unable to assess diastolic function.  Normal wall motion.  Mild LA dilation.  Trivial MR.  Elevated PAP estimated  53 mmHg, RAP 15 mm.; b) 07/29/2018: Vigorous LV function, EF> 65%.  Normal wall motion.  GR 1 DD with elevated LAP.  Normal RV size and function.  Normal valves.  Trivial MR.   TRANSTHORACIC ECHOCARDIOGRAM  10/2019   EF 60 to 65%.  Normal LV size and function.  No R WMA.  GR 1 DD.  Normal PA/RVP with normal RV.  Normal RAP.  Grossly normal aortic and mitral valves.  No significant change from prior study.   Zio Patch Monitor-back-to-back 14 days (28-day total)  12/2020   Predominantly SR: HR range 47-102 bpm, AVG 63 bpm.  Rare PACs and PVCs.  9 atrial runs: Fastest 8 beats 139 bpm, longest 9 beats 90 bpm.  No sustained arrhythmias either fast or slow.  No pauses.  Symptoms noted with sinus rhythm and occasionally with sinus rhythm associated with PACs or PVCs.    REVIEW OF SYSTEMS:   Review of Systems  Constitutional: Positive for decreased appetite/ Negative for chills, fatigue, fever. HENT: Negative for mouth sores, nosebleeds, sore throat and trouble swallowing.   Eyes: Negative for eye problems and icterus.  Respiratory: Improved shortness of breath from COVID in November 2024 and improving cough. Negative for hemoptysis and wheezing.   Cardiovascular: Negative for chest pain and leg swelling.  Gastrointestinal: Negative for abdominal pain, constipation, diarrhea, nausea and  vomiting.  Genitourinary: Negative for bladder incontinence, difficulty urinating, dysuria, frequency and hematuria.   Musculoskeletal: Negative for back pain, gait problem, neck pain and neck stiffness.  Skin: Negative for itching and rash.  Neurological: Negative for dizziness, extremity weakness, gait problem, headaches, light-headedness and seizures.  Hematological: Negative for adenopathy. Does not bruise/bleed easily.  Psychiatric/Behavioral: Negative for confusion, depression and sleep disturbance. The patient is not nervous/anxious.     PHYSICAL EXAMINATION:  Blood pressure 120/62, pulse 67, temperature 98.1 F (36.7 C), temperature source Temporal, resp. rate 13, weight 130 lb (59 kg), SpO2 91%.  ECOG PERFORMANCE STATUS: 1  Physical Exam  Constitutional: Oriented to person, place, and time and well-developed, well-nourished, and in no distress.  HENT:  Head: Normocephalic and atraumatic.  Mouth/Throat: Oropharynx is clear and moist. No oropharyngeal exudate.  Eyes: Conjunctivae are normal. Right eye exhibits no discharge. Left eye exhibits no discharge. No scleral icterus.  Neck: Normal range of motion. Neck supple.  Cardiovascular: Normal rate, regular rhythm, normal heart sounds and intact distal pulses.   Pulmonary/Chest: Effort normal. She had some rhonchi in the right lung which improved with coughing. No respiratory distress. No wheezes.  Abdominal: Soft. Bowel sounds are normal. Exhibits no distension and no mass. There is no tenderness.  Musculoskeletal: Normal range of motion. Exhibits no edema.  Lymphadenopathy:    No cervical adenopathy.  Neurological: Alert and oriented to person, place, and time. Exhibits normal muscle tone. Gait normal. Coordination normal.  Skin: Skin is warm and dry. No rash noted. Not diaphoretic. No erythema. No pallor.  Psychiatric: Mood, memory and judgment normal.  Vitals reviewed.  LABORATORY DATA: Lab Results  Component Value Date    WBC 7.2 12/19/2023   HGB 12.0 12/19/2023   HCT 36.5 12/19/2023   MCV 95.8 12/19/2023   PLT 256 12/19/2023      Chemistry      Component Value Date/Time   NA 136 12/19/2023 0938   K 4.4 12/19/2023 0938   CL 98 12/19/2023 0938   CO2 32 12/19/2023 0938   BUN 8 12/19/2023 0938   CREATININE 0.70 12/19/2023 9061  Component Value Date/Time   CALCIUM  9.6 12/19/2023 0938   ALKPHOS 142 (H) 12/19/2023 0938   AST 28 12/19/2023 0938   ALT 11 12/19/2023 0938   BILITOT 0.8 12/19/2023 0938       RADIOGRAPHIC STUDIES:  No results found.   ASSESSMENT/PLAN:  Amy [name] 88 y.o. female with medical history significant for Stage II DLBCL of the tonsil who presents for a follow up visit.   Her initial workup with labs, the records, discussion with the patient the findings most consistent with a stage II diffuse large B-cell lymphoma of the tonsil. She had involvement of the right tonsil as well as axillary lymph nodes which would constitute stage II disease. She did have some mild activity in other lymph nodes throughout the body, but nowhere near as active as the ones within her head neck. Lymphomas involvement of these lymph nodes is not likely and therefore Dr. Federico proceeded with treatment as if the patient were a stage II.   Previously discussed the diagnosis of diffuse large B-cell lymphoma and the treatment options moving forward.  The regimen most recommended for this patient would be R mini CHOP with consideration for 3 cycles followed by radiation to the local lymph nodes.  This is consistent with the treatment course to be recommended with R-CHOP chemotherapy for stage II disease.  We are still currently waiting for the results of the double hit panel, however this would not likely change management as the patient would not be able to tolerate full strength chemotherapy with something like R-EPOCH.  Dr. Federico also previously discussed the risks and benefits of this R-CHOP  chemotherapy including constipation, neurotoxicity, cardiac toxicity, drop in blood counts, fatigue, nausea, vomiting, and alopecia.  The patient voiced her understanding of these complications and wished to proceed forward with treatment.   The regimen of R- miniCHOP consists of rituximab  325 mg per metered squared, cyclophosphamide  400 mg per metered squared IV, doxorubicin  25 mg per metered squared IV, vincristine  1 mg IV, and prednisone  40 mg per metered squared p.o. on days 1 through 5.  This will be pursued for 3 cycles of chemotherapy with the addition of radiation therapy.  Based on NCCN recommendations, member institutes do practice this regimen (short course chemo + RT), though data is not robust.  # Diffuse Large B Cell Lymphoma, Stage II --complete response noted on PET CT scan on 01/08/2020. No evidence of recurrent on CT scans from 01/05/2022.  --completed 3 cycles of R-mini-CHOP followed by localized radiation.  -- TTE shows EF 60-65% with mild diastolic dysfunction --patient has established care with Dr. Izell and underwent radiation therapy which ended on 02/27/2020.  -- Labs today show white blood cell count 7.2, Hgb 12.0, MCV 95.8, Plt 256. Creatinine and LFTs are both within normal limits. --repeat CT scan can be performed as clinically indicated moving forward --RTC in 6 months time for repeat clinic visit.  I discussed with the patient should she ever develop any new concerning symptoms we would always be happy to see her sooner  # Iron Deficiency -- Noted on labs drawn last month. -- Will repeat ferritin and iron panel today -- Levels are found to be low would recommend starting p.o. ferrous sulfate 325 mg daily with a source of vitamin C.   No orders of the defined types were placed in this encounter.    All questions were answered. The patient knows to call the clinic with any problems, questions or concerns.  A total of more than 25 minutes were spent on this  encounter and over half of that time was spent on counseling and coordination of care as outlined above.   Norleen IVAR Kidney, MD Department of Hematology/Oncology [facility] at [facility] Phone: [phone] Pager: [phone] Email: [email or web address]

## 2024-02-07 ENCOUNTER — Ambulatory Visit

## 2024-06-18 ENCOUNTER — Inpatient Hospital Stay

## 2024-06-18 ENCOUNTER — Inpatient Hospital Stay: Admitting: Hematology and Oncology
